# Patient Record
Sex: Female | Born: 1970 | Race: White | Hispanic: No | Smoking: Current every day smoker
Health system: Southern US, Community
[De-identification: ages and names within clinical notes are randomized; demographics above are authoritative.]

## PROBLEM LIST (undated history)

## (undated) DIAGNOSIS — I10 Essential (primary) hypertension: Secondary | ICD-10-CM

## (undated) DIAGNOSIS — G5600 Carpal tunnel syndrome, unspecified upper limb: Secondary | ICD-10-CM

## (undated) DIAGNOSIS — R569 Unspecified convulsions: Secondary | ICD-10-CM

## (undated) DIAGNOSIS — M797 Fibromyalgia: Secondary | ICD-10-CM

## (undated) DIAGNOSIS — F329 Major depressive disorder, single episode, unspecified: Secondary | ICD-10-CM

## (undated) DIAGNOSIS — Z21 Asymptomatic human immunodeficiency virus [HIV] infection status: Secondary | ICD-10-CM

## (undated) DIAGNOSIS — N921 Excessive and frequent menstruation with irregular cycle: Secondary | ICD-10-CM

## (undated) DIAGNOSIS — N852 Hypertrophy of uterus: Secondary | ICD-10-CM

## (undated) DIAGNOSIS — J45909 Unspecified asthma, uncomplicated: Secondary | ICD-10-CM

## (undated) DIAGNOSIS — R87619 Unspecified abnormal cytological findings in specimens from cervix uteri: Secondary | ICD-10-CM

## (undated) DIAGNOSIS — N946 Dysmenorrhea, unspecified: Secondary | ICD-10-CM

## (undated) DIAGNOSIS — M1990 Unspecified osteoarthritis, unspecified site: Secondary | ICD-10-CM

## (undated) DIAGNOSIS — F319 Bipolar disorder, unspecified: Secondary | ICD-10-CM

## (undated) DIAGNOSIS — F32A Depression, unspecified: Secondary | ICD-10-CM

## (undated) DIAGNOSIS — E785 Hyperlipidemia, unspecified: Secondary | ICD-10-CM

## (undated) DIAGNOSIS — F603 Borderline personality disorder: Secondary | ICD-10-CM

## (undated) DIAGNOSIS — T7840XA Allergy, unspecified, initial encounter: Secondary | ICD-10-CM

## (undated) DIAGNOSIS — B009 Herpesviral infection, unspecified: Secondary | ICD-10-CM

## (undated) DIAGNOSIS — K219 Gastro-esophageal reflux disease without esophagitis: Secondary | ICD-10-CM

## (undated) DIAGNOSIS — B20 Human immunodeficiency virus [HIV] disease: Secondary | ICD-10-CM

## (undated) DIAGNOSIS — R002 Palpitations: Secondary | ICD-10-CM

## (undated) HISTORY — DX: Hypertrophy of uterus: N85.2

## (undated) HISTORY — DX: Carpal tunnel syndrome, unspecified upper limb: G56.00

## (undated) HISTORY — DX: Depression, unspecified: F32.A

## (undated) HISTORY — DX: Major depressive disorder, single episode, unspecified: F32.9

## (undated) HISTORY — DX: Excessive and frequent menstruation with irregular cycle: N92.1

## (undated) HISTORY — DX: Dysmenorrhea, unspecified: N94.6

## (undated) HISTORY — DX: Allergy, unspecified, initial encounter: T78.40XA

## (undated) HISTORY — PX: ANKLE SURGERY: SHX546

## (undated) HISTORY — DX: Bipolar disorder, unspecified: F31.9

## (undated) HISTORY — DX: Hyperlipidemia, unspecified: E78.5

## (undated) HISTORY — DX: Herpesviral infection, unspecified: B00.9

## (undated) HISTORY — PX: SHOULDER SURGERY: SHX246

## (undated) HISTORY — DX: Unspecified abnormal cytological findings in specimens from cervix uteri: R87.619

## (undated) HISTORY — PX: FRACTURE SURGERY: SHX138

## (undated) HISTORY — PX: BACK SURGERY: SHX140

## (undated) HISTORY — PX: OTHER SURGICAL HISTORY: SHX169

## (undated) HISTORY — PX: SPINE SURGERY: SHX786

---

## 2012-10-01 ENCOUNTER — Ambulatory Visit: Payer: Self-pay

## 2013-07-02 ENCOUNTER — Telehealth: Payer: Self-pay

## 2013-07-02 NOTE — Telephone Encounter (Signed)
Pt called Rachel Underwood to request assitance with getting an appointment.  Patient stated she  was in care with [facility] briefly before.  I will need to Call [facility] to get proof of positivity since there are no labs in our system.    Appointment given to [facility] with [facility].   Laurell Josephs, RN

## 2013-07-15 ENCOUNTER — Ambulatory Visit (INDEPENDENT_AMBULATORY_CARE_PROVIDER_SITE_OTHER): Payer: Medicaid Other

## 2013-07-15 ENCOUNTER — Ambulatory Visit: Payer: Self-pay

## 2013-07-15 DIAGNOSIS — Z113 Encounter for screening for infections with a predominantly sexual mode of transmission: Secondary | ICD-10-CM

## 2013-07-15 DIAGNOSIS — F319 Bipolar disorder, unspecified: Secondary | ICD-10-CM

## 2013-07-15 DIAGNOSIS — F209 Schizophrenia, unspecified: Secondary | ICD-10-CM

## 2013-07-15 DIAGNOSIS — B20 Human immunodeficiency virus [HIV] disease: Secondary | ICD-10-CM

## 2013-07-15 DIAGNOSIS — Z79899 Other long term (current) drug therapy: Secondary | ICD-10-CM

## 2013-07-15 DIAGNOSIS — F17200 Nicotine dependence, unspecified, uncomplicated: Secondary | ICD-10-CM

## 2013-07-15 LAB — COMPLETE METABOLIC PANEL WITH GFR
BUN: 7 mg/dL (ref 6–23)
CO2: 27 mEq/L (ref 19–32)
Calcium: 9.2 mg/dL (ref 8.4–10.5)
Chloride: 105 mEq/L (ref 96–112)
Creat: 0.64 mg/dL (ref 0.50–1.10)
GFR, Est African American: >89 mL/min
GFR, Est Non African American: >89 mL/min

## 2013-07-15 LAB — LIPID PANEL
HDL: 32 mg/dL — ABNORMAL LOW (ref >39)
LDL Cholesterol: 86 mg/dL (ref 0–99)
Triglycerides: 224 mg/dL — ABNORMAL HIGH (ref <150)
VLDL: 45 mg/dL — ABNORMAL HIGH (ref 0–40)

## 2013-07-15 LAB — CBC WITH DIFFERENTIAL/PLATELET
Eosinophils Relative: 1 % (ref 0–5)
HCT: 39.4 % (ref 36.0–46.0)
Lymphocytes Relative: 35 % (ref 12–46)
Lymphs Abs: 2.3 10*3/uL (ref 0.7–4.0)
MCH: 31.5 pg (ref 26.0–34.0)
MCV: 89.3 fL (ref 78.0–100.0)
Monocytes Absolute: 0.4 10*3/uL (ref 0.1–1.0)
RBC: 4.41 MIL/uL (ref 3.87–5.11)
RDW: 13.2 % (ref 11.5–15.5)
WBC: 6.5 10*3/uL (ref 4.0–10.5)

## 2013-07-15 LAB — HEPATITIS B SURFACE ANTIGEN: Hepatitis B Surface Ag: NEGATIVE

## 2013-07-15 LAB — RPR

## 2013-07-16 LAB — URINALYSIS
Bilirubin Urine: NEGATIVE
Ketones, ur: NEGATIVE mg/dL
Nitrite: NEGATIVE
Specific Gravity, Urine: 1.012 (ref 1.005–1.030)
pH: 5.5 (ref 5.0–8.0)

## 2013-07-16 LAB — HIV-1 RNA ULTRAQUANT REFLEX TO GENTYP+: HIV-1 RNA Quant, Log: <1.3 {Log} (ref <1.30)

## 2013-07-17 DIAGNOSIS — F319 Bipolar disorder, unspecified: Secondary | ICD-10-CM | POA: Insufficient documentation

## 2013-07-17 DIAGNOSIS — F209 Schizophrenia, unspecified: Secondary | ICD-10-CM | POA: Insufficient documentation

## 2013-07-17 DIAGNOSIS — B20 Human immunodeficiency virus [HIV] disease: Secondary | ICD-10-CM | POA: Insufficient documentation

## 2013-07-17 LAB — TB SKIN TEST
Induration: 0 mm
TB Skin Test: NEGATIVE

## 2013-07-17 MED ORDER — EMTRICITAB-RILPIVIR-TENOFOV DF 200-25-300 MG PO TABS: 1.0000 | ORAL_TABLET | Freq: Every day | ORAL | Status: DC

## 2013-07-17 MED ORDER — LURASIDONE HCL 40 MG PO TABS: 40.0000 mg | ORAL_TABLET | Freq: Every day | ORAL | Status: DC

## 2013-07-25 NOTE — Progress Notes (Signed)
Pt recently separated from husband and moved to [place] to live with her Mother.  She has not seen an ID physician in 4 months and  currently taking Complera.  Past medications are Sustiva, Norvir, Truvada and Atripla. She has a history of depression , Schizophrenia and Bi polar disorder and was taking Latuda 40 mgs until she ran out of medication one month ago. Her medications were provided through patient assistance.  Patient reports a scheduled visit with Mental health sometime this month.  Per patient last viral load was undetectable.    Laurell Josephs, RN

## 2013-08-07 ENCOUNTER — Ambulatory Visit: Payer: Self-pay | Admitting: Internal Medicine

## 2013-08-26 ENCOUNTER — Ambulatory Visit (INDEPENDENT_AMBULATORY_CARE_PROVIDER_SITE_OTHER): Payer: Medicaid Other | Admitting: Internal Medicine

## 2013-08-26 VITALS — BP 117/78 | HR 53 | Temp 97.9°F | Wt 291.0 lb

## 2013-08-26 DIAGNOSIS — Z113 Encounter for screening for infections with a predominantly sexual mode of transmission: Secondary | ICD-10-CM

## 2013-08-26 DIAGNOSIS — B20 Human immunodeficiency virus [HIV] disease: Secondary | ICD-10-CM

## 2013-08-26 DIAGNOSIS — F209 Schizophrenia, unspecified: Secondary | ICD-10-CM

## 2013-08-26 DIAGNOSIS — F319 Bipolar disorder, unspecified: Secondary | ICD-10-CM

## 2013-08-26 LAB — RPR

## 2013-08-26 MED ORDER — EMTRICITAB-RILPIVIR-TENOFOV DF 200-25-300 MG PO TABS: 1.0000 | ORAL_TABLET | Freq: Every day | ORAL | Status: DC

## 2013-08-26 NOTE — Progress Notes (Signed)
[facility] HIV CLINIC NOTE  RFV: re-establishing care Subjective:    Patient ID: Rachel Underwood, female    DOB: Jul 31, 1971, 42 y.o.   MRN: [id number]  HPI Rachel Underwood is a 42 yo F with HIV, CD 580(24%)/VL <20, complera started in the last year. HIV diagnosed in 1994. Had been on previously been on other regimens but couldn't tolerate. Inc [place], [place], truvada, viread, ATV/r.  Previously treated in [place]. Moved due to separation after 40 yo marriage. Her husband is 17yo senior to her.  Now closer to dad/step-mom . [facility]. 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 No longer on latuda and on trazadone 300mg . Feeling numb. Applying for disability for the past year. Living with her dad.  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  Allergies  Allergen Reactions  . Lexapro [Escitalopram Oxalate] Rash   - haldol and codiene make her sick  Current Outpatient Prescriptions on File Prior to Visit  Medication Sig Dispense Refill  . acyclovir (ZOVIRAX) 400 MG tablet Take 400 mg by mouth every 4 (four) hours while awake.      Marland Kitchen atenolol (TENORMIN) 100 MG tablet Take 100 mg by mouth daily.      . Emtricitab-Rilpivir-Tenofovir 200-25-300 MG TABS Take 1 tablet by mouth daily.  30 tablet  0  . lurasidone (LATUDA) 40 MG TABS tablet Take 1 tablet (40 mg total) by mouth daily with breakfast.  30 tablet  0   No current facility-administered medications on file prior to visit.   Active Ambulatory Problems    Diagnosis Date Noted  . Human immunodeficiency virus (HIV) disease 07/17/2013  . Schizophrenia 07/17/2013  . Bipolar disorder, unspecified 07/17/2013   Resolved Ambulatory Problems    Diagnosis Date Noted  . No Resolved Ambulatory Problems   Past Medical History  Diagnosis Date  . Depression   - c/section in 1994 - 2008 right  ankle and right shoulder surgery - 2009 low back surgery  History  Substance Use Topics  . Smoking status: Current Every Day Smoker -- 0.50 packs/day for 30 years    Types: Cigarettes  . Smokeless tobacco: Never Used  . Alcohol Use: 40.0 oz/week    80 drink(s) per week  family history includes Cancer in her mother; Hypertension in her father.     Review of Systems  Constitutional: Negative for fever, chills, diaphoresis, activity change, appetite change, fatigue and unexpected weight change.  HENT: Negative for congestion, sore throat, rhinorrhea, sneezing, trouble swallowing and sinus pressure.  Eyes: Negative for photophobia and visual disturbance.  Respiratory: Negative for cough, chest tightness, shortness of breath, wheezing and stridor.  Cardiovascular: Negative for chest pain, palpitations and leg swelling.  Gastrointestinal: Negative for nausea, vomiting, abdominal pain, diarrhea, constipation, blood in stool, abdominal distention and anal bleeding.  Genitourinary: intermittent vaginal discharge, itchy Musculoskeletal: Negative for myalgias, back pain, joint swelling, arthralgias and gait problem.  Skin: Negative for color change, pallor, rash and wound.  Neurological: Negative for dizziness, tremors, weakness and light-headedness.  Hematological: Negative for adenopathy. Does not bruise/bleed easily.  Psychiatric/Behavioral: Negative for behavioral problems, confusion, sleep disturbance, dysphoric mood, decreased concentration and agitation.       Objective:   Physical Exam BP 117/78  Pulse 53  Temp(Src) 97.9 F (36.6 C) (Oral)  Wt 291 lb (131.997 kg)  LMP 08/08/2013 Physical Exam  Constitutional: oriented to  person, place, and time.  appears well-developed and well-nourished. No distress.  HENT:  Mouth/Throat: Oropharynx is clear and moist. No oropharyngeal exudate.  Cardiovascular: Normal rate, regular rhythm and normal heart sounds. Exam reveals no gallop and  no friction rub.  No murmur heard.  Pulmonary/Chest: Effort normal and breath sounds normal. No respiratory distress.  no wheezes.  Abdominal: Soft. Bowel sounds are normal. He exhibits no distension. There is no tenderness.  Lymphadenopathy: no cervical adenopathy.  Neurological: He is alert and oriented to person, place, and time.  Skin: Skin is warm and dry. No rash noted. No erythema.  Psychiatric: He has a normal mood and affect. His behavior is normal.       Assessment & Plan:  hiv = continue with complera; see if adap approval  Dental pain/sensitivity = needs assessment hasn't seen dentist in 6 yrs  Health maintenance = pap smear to schedule. Will do ur gc and chlam, rpr today; flu shot today  Onychomycosis = to big toe nail bilateral. Would like to try oral agent, will do lamisil 250mg  daily once she gets adap approval  hsv proph = refill with valtrex

## 2013-09-09 ENCOUNTER — Telehealth: Payer: Self-pay | Admitting: *Deleted

## 2013-09-09 ENCOUNTER — Other Ambulatory Visit: Payer: Self-pay | Admitting: *Deleted

## 2013-09-09 DIAGNOSIS — F319 Bipolar disorder, unspecified: Secondary | ICD-10-CM

## 2013-09-09 DIAGNOSIS — F209 Schizophrenia, unspecified: Secondary | ICD-10-CM

## 2013-09-09 MED ORDER — EMTRICITAB-RILPIVIR-TENOFOV DF 200-25-300 MG PO TABS: 1.0000 | ORAL_TABLET | Freq: Every day | ORAL | Status: DC

## 2013-09-09 NOTE — Telephone Encounter (Signed)
Pt called to let us know that she would prefer [facility] to deliver her monthly prescriptions once she is ADAP approved.  Pt also advised that she is still itching and has vaginal discharge.  Dr. Drue Second recommended that the patient try an over the counter yeast infection treatment for the itching. RN left this information on pt's voicemail.  Pt to follow up 10/20 in clinic. Andree Coss, RN

## 2013-09-22 ENCOUNTER — Encounter: Payer: Self-pay | Admitting: Internal Medicine

## 2013-09-22 ENCOUNTER — Ambulatory Visit (INDEPENDENT_AMBULATORY_CARE_PROVIDER_SITE_OTHER): Payer: Medicaid Other | Admitting: *Deleted

## 2013-09-22 ENCOUNTER — Ambulatory Visit (INDEPENDENT_AMBULATORY_CARE_PROVIDER_SITE_OTHER): Payer: Medicaid Other | Admitting: Internal Medicine

## 2013-09-22 VITALS — BP 127/83 | HR 68 | Temp 98.2°F | Wt 283.0 lb

## 2013-09-22 DIAGNOSIS — B20 Human immunodeficiency virus [HIV] disease: Secondary | ICD-10-CM

## 2013-09-22 DIAGNOSIS — Z124 Encounter for screening for malignant neoplasm of cervix: Secondary | ICD-10-CM

## 2013-09-22 DIAGNOSIS — F329 Major depressive disorder, single episode, unspecified: Secondary | ICD-10-CM

## 2013-09-22 DIAGNOSIS — Z1231 Encounter for screening mammogram for malignant neoplasm of breast: Secondary | ICD-10-CM

## 2013-09-22 NOTE — Progress Notes (Signed)
[facility] HIV CLINIC NOTE  RFV: 1 month follow up Subjective:    Patient ID: Rachel Underwood, female    DOB: [birth year], 42 y.o.   MRN: [id number]  HPI Rachel Underwood is a 43 yo F with HIV, CD 580(24%)/VL <20, complera started in the last year. HIV diagnosed in 1994. Had been on previously been on other regimens but couldn't tolerate. Inc [place], [place], truvada, viread, ATV/r. Previously treated in [place]. Moved due to separation after 40 yo marriage.. [facility]. 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 Marland Kitchen Applying for disability for the past year. Now on zoloft, no longer latuda, but still on trazodone.  Current Outpatient Prescriptions on File Prior to Visit  Medication Sig Dispense Refill  . acyclovir (ZOVIRAX) 400 MG tablet Take 400 mg by mouth every 4 (four) hours while awake.      Marland Kitchen atenolol (TENORMIN) 100 MG tablet Take 100 mg by mouth daily.      . Emtricitab-Rilpivir-Tenofovir 200-25-300 MG TABS Take 1 tablet by mouth daily.  30 tablet  5  . lurasidone (LATUDA) 40 MG TABS tablet Take 1 tablet (40 mg total) by mouth daily with breakfast.  30 tablet  0  . trazodone (DESYREL) 300 MG tablet Take 300 mg by mouth at bedtime.       No current facility-administered medications on file prior to visit.   Active Ambulatory Problems    Diagnosis Date Noted  . Human immunodeficiency virus (HIV) disease 07/17/2013  . Schizophrenia 07/17/2013  . Bipolar disorder, unspecified 07/17/2013   Resolved Ambulatory Problems    Diagnosis Date Noted  . No Resolved Ambulatory Problems   Past Medical History  Diagnosis Date  . Depression        Review of Systems 12 point ROS is negative     Objective:   Physical Exam BP 127/83  Pulse 68  Temp(Src) 98.2 F (36.8 C) (Oral)  Wt 283 lb (128.368 kg)  LMP 08/08/2013      Assessment & Plan:  HIV = continue on complera.  Awaiting adap approval so that she can continue meds. Still has 1-2 wks availalble  Depression = being managed by [facility]  rtc in 6 wk

## 2013-09-22 NOTE — Patient Instructions (Signed)
Your results will be ready in about a week.  I will mail them to you.  Thank you for coming to the Center for your care.  Simpson Paulos,  RN 

## 2013-09-22 NOTE — Progress Notes (Signed)
  Subjective:     Rachel Underwood is a 42 y.o. woman who comes in today for a  pap smear only.  Previous abnormal Pap smears: yes. Contraception: BTL.  Objective:  LMP 09/03/13   Pelvic Exam:  Pap smear obtained.   Assessment:    Screening pap smear.   Plan:    Follow up in one year, or as indicated by Pap results.  Pt given educational materials re: HIV and women, self-esteem, nutrition and diet management, PAP smears and partner safety. Pt given condoms.

## 2013-09-29 ENCOUNTER — Encounter: Payer: Self-pay | Admitting: *Deleted

## 2013-10-16 ENCOUNTER — Emergency Department (HOSPITAL_COMMUNITY)
Admission: EM | Admit: 2013-10-16 | Discharge: 2013-10-16 | Disposition: A | Discharge status: Home / Self Care | Payer: Medicaid Other | Attending: Emergency Medicine | Admitting: Emergency Medicine

## 2013-10-16 ENCOUNTER — Ambulatory Visit (HOSPITAL_COMMUNITY): Payer: Medicaid Other

## 2013-10-16 ENCOUNTER — Encounter (HOSPITAL_COMMUNITY): Payer: Self-pay | Admitting: Emergency Medicine

## 2013-10-16 DIAGNOSIS — F17200 Nicotine dependence, unspecified, uncomplicated: Secondary | ICD-10-CM | POA: Insufficient documentation

## 2013-10-16 DIAGNOSIS — I10 Essential (primary) hypertension: Secondary | ICD-10-CM | POA: Insufficient documentation

## 2013-10-16 DIAGNOSIS — Z79899 Other long term (current) drug therapy: Secondary | ICD-10-CM | POA: Insufficient documentation

## 2013-10-16 DIAGNOSIS — F329 Major depressive disorder, single episode, unspecified: Secondary | ICD-10-CM | POA: Insufficient documentation

## 2013-10-16 DIAGNOSIS — J209 Acute bronchitis, unspecified: Secondary | ICD-10-CM | POA: Insufficient documentation

## 2013-10-16 DIAGNOSIS — J45909 Unspecified asthma, uncomplicated: Secondary | ICD-10-CM | POA: Insufficient documentation

## 2013-10-16 DIAGNOSIS — Z21 Asymptomatic human immunodeficiency virus [HIV] infection status: Secondary | ICD-10-CM | POA: Insufficient documentation

## 2013-10-16 DIAGNOSIS — F3289 Other specified depressive episodes: Secondary | ICD-10-CM | POA: Insufficient documentation

## 2013-10-16 DIAGNOSIS — J40 Bronchitis, not specified as acute or chronic: Secondary | ICD-10-CM

## 2013-10-16 DIAGNOSIS — J329 Chronic sinusitis, unspecified: Secondary | ICD-10-CM | POA: Insufficient documentation

## 2013-10-16 HISTORY — DX: Human immunodeficiency virus (HIV) disease: B20

## 2013-10-16 HISTORY — DX: Essential (primary) hypertension: I10

## 2013-10-16 HISTORY — DX: Asymptomatic human immunodeficiency virus (hiv) infection status: Z21

## 2013-10-16 HISTORY — DX: Unspecified asthma, uncomplicated: J45.909

## 2013-10-16 MED ORDER — BENZONATATE 100 MG PO CAPS: 100.0000 mg | ORAL_CAPSULE | Freq: Three times a day (TID) | ORAL | Status: DC

## 2013-10-16 MED ORDER — AZITHROMYCIN 250 MG PO TABS
500.0000 mg | ORAL_TABLET | Freq: Once | ORAL | Status: AC
  Administered 2013-10-16: 500 mg via ORAL
  Filled 2013-10-16: qty 2

## 2013-10-16 MED ORDER — AZITHROMYCIN 250 MG PO TABS: 250.0000 mg | ORAL_TABLET | Freq: Every day | ORAL | Status: DC

## 2013-10-16 NOTE — ED Provider Notes (Signed)
CSN: [id number]     Arrival date & time 10/16/13  4098 History   First MD Initiated Contact with Patient 10/16/13 0522     Chief Complaint  Patient presents with  . Cough  . Headache  . Nasal Congestion   (Consider location/radiation/quality/duration/timing/severity/associated sxs/prior Treatment) HPI Comments: 42 year old female with a history of HIV, hypertension and asthma presents with a cough. She states that the cough started 3 weeks ago, was gradual in onset, has been persistent, is gradually worsening and is specifically worse over the last 4 days. She denies fevers or chills but has a very productive cough with a thick yellow phlegm. Nothing seems to make the symptoms better or worse, she has an albuterol inhaler which she has not been using very frequently area and she has been using over-the-counter cough medications with minimal improvement in her cough. She denies chest pain and back pain. Her last CD4 count was over 500 approximately one month ago, last viral load was undetectable.  Patient is a 42 y.o. female presenting with cough and headaches. The history is provided by the patient.  Cough Associated symptoms: headaches   Headache Associated symptoms: cough     Past Medical History  Diagnosis Date  . Depression   . HIV (human immunodeficiency virus infection)   . Hypertension   . Asthma    Past Surgical History  Procedure Laterality Date  . Ankle surgery    . Back surgery    . Shoulder surgery    . Cesarean section     Family History  Problem Relation Age of Onset  . Cancer Mother     Breast  . Hypertension Father    History  Substance Use Topics  . Smoking status: Current Every Day Smoker -- 0.50 packs/day for 30 years    Types: Cigarettes  . Smokeless tobacco: Never Used  . Alcohol Use: 0.0 oz/week   OB History   Grav Para Term Preterm Abortions TAB SAB Ect Mult Living                 Review of Systems  Respiratory: Positive for cough.    Neurological: Positive for headaches.  All other systems reviewed and are negative.    Allergies  Lexapro  Home Medications   Current Outpatient Rx  Name  Route  Sig  Dispense  Refill  . atenolol (TENORMIN) 100 MG tablet   Oral   Take 100 mg by mouth daily.         . Emtricitab-Rilpivir-Tenofovir 200-25-300 MG TABS   Oral   Take 1 tablet by mouth daily.   30 tablet   5   . sertraline (ZOLOFT) 50 MG tablet   Oral   Take 50 mg by mouth daily.         . trazodone (DESYREL) 300 MG tablet   Oral   Take 300 mg by mouth at bedtime.         Marland Kitchen acyclovir (ZOVIRAX) 400 MG tablet   Oral   Take 400 mg by mouth every 4 (four) hours while awake.         Marland Kitchen azithromycin (ZITHROMAX Z-PAK) 250 MG tablet   Oral   Take 1 tablet (250 mg total) by mouth daily. 500mg  PO day 1, then 250mg  PO days 205   6 tablet   0   . benzonatate (TESSALON) 100 MG capsule   Oral   Take 1 capsule (100 mg total) by mouth every 8 (eight) hours.  21 capsule   0   . lurasidone (LATUDA) 40 MG TABS tablet   Oral   Take 1 tablet (40 mg total) by mouth daily with breakfast.   30 tablet   0    BP 125/86  Pulse 86  Temp(Src) 98.5 F (36.9 C) (Oral)  Resp 18  Ht 5\' 5"  (1.651 m)  Wt 280 lb (127.007 kg)  BMI 46.59 kg/m2  SpO2 97%  LMP 10/15/2013 Physical Exam  Nursing note and vitals reviewed. Constitutional: She appears well-developed and well-nourished. No distress.  HENT:  Head: Normocephalic and atraumatic.  Mouth/Throat: Oropharynx is clear and moist. No oropharyngeal exudate.  Nasal passages without significant swelling or discharge, oropharynx is clear and moist  Eyes: Conjunctivae and EOM are normal. Pupils are equal, round, and reactive to light. Right eye exhibits no discharge. Left eye exhibits no discharge. No scleral icterus.  Neck: Normal range of motion. Neck supple. No JVD present. No thyromegaly present.  Cardiovascular: Normal rate, regular rhythm, normal heart sounds  and intact distal pulses.  Exam reveals no gallop and no friction rub.   No murmur heard. Pulmonary/Chest: Effort normal and breath sounds normal. No respiratory distress. She has no wheezes. She has no rales.  Abdominal: Soft. Bowel sounds are normal. She exhibits no distension and no mass. There is no tenderness.  Musculoskeletal: Normal range of motion. She exhibits no edema and no tenderness.  Lymphadenopathy:    She has no cervical adenopathy.  Neurological: She is alert. Coordination normal.  Skin: Skin is warm and dry. No rash noted. No erythema.  Psychiatric: She has a normal mood and affect. Her behavior is normal.    ED Course  Procedures (including critical care time) Labs Review Labs Reviewed - No data to display Imaging Review No results found.  EKG Interpretation   None       MDM   1. Bronchitis   2. Sinusitis    There is no significant abnormal vital signs, the patient is hemodynamically stable, speaking in full sentences and likely has a bronchitis. Because of her immunocompromised state as well as her smoking history he would likely be better to treat with antibiotics in addition to her albuterol treatments at home. I have discussed with her the exact method of taking his medications and she is amenable to the discharge plan.  Meds given in ED:  Medications  azithromycin (ZITHROMAX) tablet 500 mg (not administered)    New Prescriptions   AZITHROMYCIN (ZITHROMAX Z-PAK) 250 MG TABLET    Take 1 tablet (250 mg total) by mouth daily. 500mg  PO day 1, then 250mg  PO days 205   BENZONATATE (TESSALON) 100 MG CAPSULE    Take 1 capsule (100 mg total) by mouth every 8 (eight) hours.        Vida Roller, MD 10/16/13 [phone]

## 2013-10-16 NOTE — ED Notes (Signed)
Pt c/o headache, congestion and cough x 3 weeks.

## 2013-11-03 ENCOUNTER — Ambulatory Visit: Payer: Self-pay | Admitting: Internal Medicine

## 2013-11-06 ENCOUNTER — Ambulatory Visit (HOSPITAL_COMMUNITY): Payer: Medicaid Other

## 2013-12-05 ENCOUNTER — Other Ambulatory Visit: Payer: Self-pay | Admitting: *Deleted

## 2013-12-05 ENCOUNTER — Telehealth: Payer: Self-pay | Admitting: *Deleted

## 2013-12-05 DIAGNOSIS — B009 Herpesviral infection, unspecified: Secondary | ICD-10-CM

## 2013-12-05 MED ORDER — ACYCLOVIR 400 MG PO TABS: 400.0000 mg | ORAL_TABLET | Freq: Two times a day (BID) | ORAL | Status: DC

## 2013-12-05 MED ORDER — ACYCLOVIR 400 MG PO TABS: 400.0000 mg | ORAL_TABLET | Freq: Every day | ORAL | Status: DC

## 2013-12-05 NOTE — Telephone Encounter (Signed)
Can refill acyclovir for 400mg  five times per day for 5 days  For treatment, then do 400mg  BId for prophylaxis

## 2013-12-05 NOTE — Telephone Encounter (Signed)
Patient requesting refill of acyclovir 400mg  four times a day for her current herpes outbreak (listed as historic medication, never prescribed here).  08/26/13 office note lists HSV prophylaxis of valtrex, not prescribed at visit. Please advise which medication to send to pharmacy. Landis Gandy, RN

## 2013-12-08 NOTE — Telephone Encounter (Signed)
Sent and notified patient. Thank you!

## 2013-12-15 ENCOUNTER — Ambulatory Visit (HOSPITAL_COMMUNITY)
Admission: RE | Admit: 2013-12-15 | Discharge: 2013-12-15 | Disposition: A | Discharge status: Home / Self Care | Payer: Medicaid Other | Source: Ambulatory Visit | Attending: Internal Medicine | Admitting: Internal Medicine

## 2013-12-15 DIAGNOSIS — Z1231 Encounter for screening mammogram for malignant neoplasm of breast: Secondary | ICD-10-CM

## 2013-12-24 ENCOUNTER — Other Ambulatory Visit: Payer: Self-pay | Admitting: Licensed Clinical Social Worker

## 2013-12-24 DIAGNOSIS — I10 Essential (primary) hypertension: Secondary | ICD-10-CM

## 2013-12-24 MED ORDER — ATENOLOL 100 MG PO TABS: 100.0000 mg | ORAL_TABLET | Freq: Every day | ORAL | Status: DC

## 2014-01-01 ENCOUNTER — Other Ambulatory Visit (INDEPENDENT_AMBULATORY_CARE_PROVIDER_SITE_OTHER): Payer: Medicaid Other

## 2014-01-01 ENCOUNTER — Ambulatory Visit: Payer: Self-pay

## 2014-01-01 DIAGNOSIS — B20 Human immunodeficiency virus [HIV] disease: Secondary | ICD-10-CM

## 2014-01-01 LAB — CBC WITH DIFFERENTIAL/PLATELET
Basophils Absolute: 0 10*3/uL (ref 0.0–0.1)
Basophils Relative: 0 % (ref 0–1)
EOS ABS: 0.1 10*3/uL (ref 0.0–0.7)
EOS PCT: 1 % (ref 0–5)
HCT: 40.1 % (ref 36.0–46.0)
HEMOGLOBIN: 14.1 g/dL (ref 12.0–15.0)
LYMPHS ABS: 3.2 10*3/uL (ref 0.7–4.0)
Lymphocytes Relative: 41 % (ref 12–46)
MCH: 31.7 pg (ref 26.0–34.0)
MCHC: 35.2 g/dL (ref 30.0–36.0)
MCV: 90.1 fL (ref 78.0–100.0)
MONOS PCT: 7 % (ref 3–12)
Monocytes Absolute: 0.6 10*3/uL (ref 0.1–1.0)
Neutro Abs: 3.9 10*3/uL (ref 1.7–7.7)
Neutrophils Relative %: 51 % (ref 43–77)
Platelets: 230 10*3/uL (ref 150–400)
RBC: 4.45 MIL/uL (ref 3.87–5.11)
RDW: 13.1 % (ref 11.5–15.5)
WBC: 7.8 10*3/uL (ref 4.0–10.5)

## 2014-01-01 LAB — COMPREHENSIVE METABOLIC PANEL
ALBUMIN: 3.9 g/dL (ref 3.5–5.2)
ALT: 13 U/L (ref 0–35)
AST: 14 U/L (ref 0–37)
Alkaline Phosphatase: 57 U/L (ref 39–117)
BUN: 9 mg/dL (ref 6–23)
CHLORIDE: 106 meq/L (ref 96–112)
CO2: 26 mEq/L (ref 19–32)
Calcium: 9.4 mg/dL (ref 8.4–10.5)
Creat: 0.66 mg/dL (ref 0.50–1.10)
GLUCOSE: 90 mg/dL (ref 70–99)
Potassium: 5.2 mEq/L (ref 3.5–5.3)
Sodium: 137 mEq/L (ref 135–145)
Total Bilirubin: 0.5 mg/dL (ref 0.2–1.2)
Total Protein: 6.9 g/dL (ref 6.0–8.3)

## 2014-01-02 LAB — T-HELPER CELL (CD4) - (RCID CLINIC ONLY)
CD4 T CELL HELPER: 27 % — AB (ref 33–55)
CD4 T Cell Abs: 890 /uL (ref 400–2700)

## 2014-01-04 LAB — HIV-1 RNA QUANT-NO REFLEX-BLD
HIV 1 RNA Quant: <20 copies/mL (ref <20)
HIV-1 RNA Quant, Log: <1.3 {Log} (ref <1.30)

## 2014-01-05 ENCOUNTER — Other Ambulatory Visit: Payer: Self-pay | Admitting: Licensed Clinical Social Worker

## 2014-01-05 DIAGNOSIS — F209 Schizophrenia, unspecified: Secondary | ICD-10-CM

## 2014-01-05 DIAGNOSIS — F319 Bipolar disorder, unspecified: Secondary | ICD-10-CM

## 2014-01-05 MED ORDER — EMTRICITAB-RILPIVIR-TENOFOV DF 200-25-300 MG PO TABS: 1.0000 | ORAL_TABLET | Freq: Every day | ORAL | Status: DC

## 2014-01-07 ENCOUNTER — Encounter: Payer: Self-pay | Admitting: Internal Medicine

## 2014-01-07 ENCOUNTER — Ambulatory Visit (INDEPENDENT_AMBULATORY_CARE_PROVIDER_SITE_OTHER): Payer: Medicaid Other | Admitting: Internal Medicine

## 2014-01-07 VITALS — BP 131/84 | HR 74 | Temp 98.6°F | Ht 65.5 in | Wt 279.0 lb

## 2014-01-07 DIAGNOSIS — J111 Influenza due to unidentified influenza virus with other respiratory manifestations: Secondary | ICD-10-CM

## 2014-01-07 DIAGNOSIS — Z23 Encounter for immunization: Secondary | ICD-10-CM

## 2014-01-07 NOTE — Progress Notes (Signed)
   Subjective:    Patient ID: Verda Willis, female    DOB: [birth year], 43 y.o.   MRN: [id number]  HPI herre for 5 days of congestion, sinus pain, post nasal drip.  Had subjective fever initially with muscle aches.  + sick contact.  Smokes, has seasonal allergies.     Review of Systems  Constitutional: Negative for fever.  HENT: Positive for congestion, postnasal drip, rhinorrhea, sinus pressure and sore throat.   Respiratory: Positive for cough. Negative for shortness of breath.   Gastrointestinal: Negative for nausea and diarrhea.       Objective:   Physical Exam  Constitutional: She appears well-developed and well-nourished.  HENT:  Mouth/Throat: No oropharyngeal exudate.  + red streaking  Eyes: No scleral icterus.  Cardiovascular: Normal rate, regular rhythm and normal heart sounds.   No murmur heard. Pulmonary/Chest: Effort normal and breath sounds normal. No respiratory distress. She has no wheezes. She has no rales.  Lymphadenopathy:    She has no cervical adenopathy.  Skin: No rash noted.          Assessment & Plan:

## 2014-01-07 NOTE — Assessment & Plan Note (Signed)
Supportive care, ibuprofen, saline spray, antihistamine.

## 2014-01-09 ENCOUNTER — Telehealth: Payer: Self-pay | Admitting: *Deleted

## 2014-01-09 ENCOUNTER — Other Ambulatory Visit: Payer: Self-pay | Admitting: Internal Medicine

## 2014-01-09 MED ORDER — PSEUDOEPH-BROMPHEN-DM 30-2-10 MG/5ML PO SYRP: 5.0000 mL | ORAL_SOLUTION | Freq: Three times a day (TID) | ORAL | Status: DC | PRN

## 2014-01-09 MED ORDER — DOXYCYCLINE HYCLATE 100 MG PO TABS: 100.0000 mg | ORAL_TABLET | Freq: Two times a day (BID) | ORAL | Status: DC

## 2014-01-09 NOTE — Telephone Encounter (Signed)
Patient called and advised she is not feeling any better since her visit 01/07/14. She advised she has yellow/green mucus when she blows her nose and coughs, she also has a low grade fever and thinks she has a sinus infection. I tried to give the patient an appt and she insist that she should not have to come back because she was just here. Advised her that she needs an appt due to the fact that her symptoms have changed. She asked if I could just ask her doctor to call in a cough Rx and an antibiotic. Advised her will ask but can not promise anything. And will call her once I get an answer.

## 2014-01-15 ENCOUNTER — Ambulatory Visit (INDEPENDENT_AMBULATORY_CARE_PROVIDER_SITE_OTHER): Payer: Medicaid Other | Admitting: Internal Medicine

## 2014-01-15 ENCOUNTER — Encounter: Payer: Self-pay | Admitting: Internal Medicine

## 2014-01-15 VITALS — BP 125/82 | HR 65 | Temp 98.7°F | Wt 287.0 lb

## 2014-01-15 DIAGNOSIS — F329 Major depressive disorder, single episode, unspecified: Secondary | ICD-10-CM

## 2014-01-15 DIAGNOSIS — F3289 Other specified depressive episodes: Secondary | ICD-10-CM

## 2014-01-15 DIAGNOSIS — B20 Human immunodeficiency virus [HIV] disease: Secondary | ICD-10-CM

## 2014-01-15 DIAGNOSIS — F32A Depression, unspecified: Secondary | ICD-10-CM

## 2014-01-15 DIAGNOSIS — I10 Essential (primary) hypertension: Secondary | ICD-10-CM

## 2014-01-15 MED ORDER — ELVITEG-COBIC-EMTRICIT-TENOFDF 150-150-200-300 MG PO TABS: 1.0000 | ORAL_TABLET | Freq: Every day | ORAL | Status: DC

## 2014-01-15 NOTE — Progress Notes (Signed)
Subjective:    Patient ID: Rachel Underwood, female    DOB: 06-Feb-1971, 43 y.o.   MRN: [id number]  HPI Kelley Cd 4 coutn of 890/VL<20, on complera despite missing at least 10 doses in the last month. Taking care of granddaughter full time. Wants to switch agent due to food constraints with complera. She states that she is often not consistently hungry thus doesn't take her hiv medications. She is in clinic with her 60 month old granddaughter "serena"  Current Outpatient Prescriptions on File Prior to Visit  Medication Sig Dispense Refill  . acyclovir (ZOVIRAX) 400 MG tablet Take 1 tablet (400 mg total) by mouth 2 (two) times daily. For prophylaxis.  60 tablet  5  . atenolol (TENORMIN) 100 MG tablet Take 1 tablet (100 mg total) by mouth daily.  30 tablet  6  . brompheniramine-pseudoephedrine-DM 30-2-10 MG/5ML syrup Take 5 mLs by mouth 3 (three) times daily as needed.  120 mL  0  . doxycycline (VIBRA-TABS) 100 MG tablet Take 1 tablet (100 mg total) by mouth 2 (two) times daily.  10 tablet  0  . Emtricitab-Rilpivir-Tenofovir 200-25-300 MG TABS Take 1 tablet by mouth daily.  30 tablet  0  . sertraline (ZOLOFT) 50 MG tablet Take 50 mg by mouth daily.      . trazodone (DESYREL) 300 MG tablet Take 300 mg by mouth at bedtime.       No current facility-administered medications on file prior to visit.     Review of Systems  Constitutional: Negative for fever, chills, diaphoresis, activity change, appetite change, fatigue and unexpected weight change.  HENT: Negative for congestion, sore throat, rhinorrhea, sneezing, trouble swallowing and sinus pressure.  Eyes: Negative for photophobia and visual disturbance.  Respiratory: Negative for cough, chest tightness, shortness of breath, wheezing and stridor.  Cardiovascular: Negative for chest pain, palpitations and leg swelling.  Gastrointestinal: Negative for nausea, vomiting, abdominal pain, diarrhea, constipation, blood in stool, abdominal distention and  anal bleeding.  Genitourinary: Negative for dysuria, hematuria, flank pain and difficulty urinating.  Musculoskeletal: Negative for myalgias, back pain, joint swelling, arthralgias and gait problem.  Skin: Negative for color change, pallor, rash and wound.  Neurological: Negative for dizziness, tremors, weakness and light-headedness.  Hematological: Negative for adenopathy. Does not bruise/bleed easily.  Psychiatric/Behavioral: Negative for behavioral problems, confusion, sleep disturbance, dysphoric mood, decreased concentration and agitation.       Objective:   Physical Exam  BP 125/82  Pulse 65  Temp(Src) 98.7 F (37.1 C) (Oral)  Wt 287 lb (130.182 kg)  Constitutional:  oriented to person, place, and time. appears well-developed and well-nourished. No distress.  HENT:  Mouth/Throat: Oropharynx is clear and moist. No oropharyngeal exudate.  Cardiovascular: Normal rate, regular rhythm and normal heart sounds. Exam reveals no gallop and no friction rub.  No murmur heard.  Pulmonary/Chest: Effort normal and breath sounds normal. No respiratory distress.  has no wheezes.  Abdominal: Soft. Bowel sounds are normal.  exhibits no distension. There is no tenderness.  Lymphadenopathy: no cervical adenopathy.  Neurological: alert and oriented to person, place, and time.  Skin: Skin is warm and dry. No rash noted. No erythema.  Psychiatric: a normal mood and affect. His behavior is normal.      Assessment & Plan:   HIV disease = well controlled but poor compliance. Will switch to stribild. I have heavily discussed that she cannot miss more than 1 dose per month on new regimen, otherwise looking to do more complicated HIV  regimen. Gave suggestion to set alarm. As well as give her pill box  Adherence = spent 20 min discussion on adherence  Hypertension = well controlled  Depression = continues on zoloft and trazodone  rtc in 4 wk for adherence evaluation

## 2014-01-16 ENCOUNTER — Other Ambulatory Visit: Payer: Self-pay | Admitting: *Deleted

## 2014-01-16 DIAGNOSIS — B20 Human immunodeficiency virus [HIV] disease: Secondary | ICD-10-CM

## 2014-01-16 MED ORDER — ELVITEG-COBIC-EMTRICIT-TENOFDF 150-150-200-300 MG PO TABS: 1.0000 | ORAL_TABLET | Freq: Every day | ORAL | Status: DC

## 2014-02-10 ENCOUNTER — Other Ambulatory Visit: Payer: Self-pay | Admitting: Internal Medicine

## 2014-02-19 ENCOUNTER — Ambulatory Visit (INDEPENDENT_AMBULATORY_CARE_PROVIDER_SITE_OTHER): Payer: Medicaid Other | Admitting: Internal Medicine

## 2014-02-19 VITALS — BP 96/64 | HR 59 | Temp 98.6°F | Wt 287.0 lb

## 2014-02-19 DIAGNOSIS — G4700 Insomnia, unspecified: Secondary | ICD-10-CM

## 2014-02-19 DIAGNOSIS — B20 Human immunodeficiency virus [HIV] disease: Secondary | ICD-10-CM

## 2014-02-19 NOTE — Progress Notes (Signed)
  [facility] - Pharmacist    HPI: Rachel Underwood is a 43 y.o. female here for a follow-up appointment for her HIV infection. She was recently changed to Man in February of 2015. Her latest labs from January 2015 show an undetectable HIV viral load and a CD4 count of 890.   Allergies: Allergies  Allergen Reactions  . Lexapro [Escitalopram Oxalate] Rash    Vitals: Temp: 98.6 F (37 C) (03/19 1109) Temp src: Oral (03/19 1109) BP: 96/64 mmHg (03/19 1109) Pulse Rate: 59 (03/19 1109)  Past Medical History: Past Medical History  Diagnosis Date  . Depression   . HIV (human immunodeficiency virus infection)   . Hypertension   . Asthma     Social History: History   Social History  . Marital Status: Legally Separated    Spouse Name: N/A    Number of Children: N/A  . Years of Education: N/A   Social History Main Topics  . Smoking status: Current Every Day Smoker -- 0.50 packs/day for 30 years    Types: Cigarettes  . Smokeless tobacco: Never Used  . Alcohol Use: 0.0 oz/week  . 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   . Sexual Activity: Not Currently    Partners: Male    Birth Control/ Protection: Condom   Other Topics Concern  . Not on file   Social History Narrative  . No narrative on file    Previous Regimen: Complera  Current Regimen: Stribild  Labs: HIV 1 RNA Quant (copies/mL)  Date Value  01/01/2014 <20   07/15/2013 <20      CD4 T Cell Abs (/uL)  Date Value  01/01/2014 890   07/15/2013 580      Hep B S Ab (no units)  Date Value  07/15/2013 POS*     Hepatitis B Surface Ag (no units)  Date Value  07/15/2013 NEGATIVE      HCV Ab (no units)  Date Value  07/15/2013 NEGATIVE     Lipids:    Component Value Date/Time   CHOL 163 07/15/2013 1223   TRIG 224* 07/15/2013 1223   HDL 32* 07/15/2013 1223   CHOLHDL 5.1 07/15/2013 1223   VLDL 45* 07/15/2013 1223   LDLCALC 86 07/15/2013  1223    Assessment: Ms. Rachel Underwood says she has missed 1-2 doses of her Stribild over the last month. She attributes this to taking care of a 98 month old, so the mornings get hectic, and this is when she usually takes her medications. We reviewed the importance of adherence and taking her medication every day. She then clarified that even if she misses her morning dose, she takes it in the afternoon. She also admits to some nausea and worsening hot flashes that began around the time of her medication change. We discussed taking the Stribild with food, as she has been taking it on an empty stomach in the morning, and this can help reduce nausea.   Recommendations: Continue Stribild; try taking with food to see if that helps with nausea. Follow-up with adherence, side effects, medication issues at her next visit.  Maisen Schmit C. Lovie Zarling, PharmD Clinical Pharmacist-Resident Pager: [phone] Pharmacy: [phone] 02/19/2014 11:26 AM

## 2014-02-19 NOTE — Progress Notes (Signed)
Subjective:    Patient ID: Rachel Underwood, female    DOB: Rachel Underwood 11, 1972, 43 y.o.   MRN: [id number]  HPI Rachel Underwood is a 43yo F with HIV, Cd 4 coutn of 890/VL<20, swithced to [facility] in mid February, reports to have missed 3 doses in the last month. Late on 3-4 doses. Usually takes at 2 pm. Gets nausea from medications in the morning. Often only drinks coffee for breakfast.  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 acyclovir (ZOVIRAX) 400 MG tablet Take 1 tablet (400 mg total) by mouth 2 (two) times daily. For prophylaxis.  60 tablet  5  . atenolol (TENORMIN) 100 MG tablet Take 1 tablet (100 mg total) by mouth daily.  30 tablet  6  . brompheniramine-pseudoephedrine-DM 30-2-10 MG/5ML syrup Take 5 mLs by mouth 3 (three) times daily as needed.  120 mL  0  . elvitegravir-cobicistat-emtricitabine-tenofovir (STRIBILD) 150-150-200-300 MG TABS tablet Take 1 tablet by mouth daily with breakfast.  30 tablet  11  . sertraline (ZOLOFT) 50 MG tablet Take 50 mg by mouth daily.      . trazodone (DESYREL) 300 MG tablet Take 300 mg by mouth at bedtime.       No current facility-administered medications on file prior to visit.    Active Ambulatory Problems    Diagnosis Date Noted  . Human immunodeficiency virus (HIV) disease 07/17/2013  . Schizophrenia 07/17/2013  . Bipolar disorder, unspecified 07/17/2013  . Influenza with other respiratory manifestations 01/07/2014  . HTN (hypertension) 01/15/2014   Resolved Ambulatory Problems    Diagnosis Date Noted  . No Resolved Ambulatory Problems   Past Medical History  Diagnosis Date  . Depression   . HIV (human immunodeficiency virus infection)   . Hypertension    . Asthma    History  Substance Use Topics  . Smoking status: Current Every Day Smoker -- 0.50 packs/day for 30 years    Types: Cigarettes  . Smokeless tobacco: Never Used  . Alcohol Use: 0.0 oz/week     Review of Systems 10 point ros is negative except for insomnia    Objective:   Physical Exam BP 96/64  Pulse 59  Temp(Src) 98.6 F (37 C) (Oral)  Wt 287 lb (130.182 kg) Physical Exam  Constitutional:  oriented to person, place, and time. appears well-developed and well-nourished. No distress.  HENT:  Mouth/Throat: Oropharynx is clear and moist. No oropharyngeal exudate.  Cardiovascular: Normal rate, regular rhythm and normal heart sounds. Exam reveals no gallop and no friction rub.  No murmur heard.  Pulmonary/Chest: Effort normal and breath sounds normal. No respiratory distress.  has no wheezes.  Abdominal: Soft. Bowel sounds are normal.  exhibits no distension. There is no tenderness.  Lymphadenopathy: no cervical adenopathy.  Neurological: alert and oriented to person, place, and time.  Skin: Skin is warm and dry. No rash noted. No erythema.  Psychiatric: a normal mood and affect.  behavior is normal.         Assessment & Plan:   hiv = continue with stribild, will check labs at next visit  rtc in 6 wk- check labs, and 7 wk visit  nightsweats = she reports family history of early menopause,  although her nightsweats did coinicde when she started to take hiv medications. 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 I asked her to discuss with gynecologist.  Insomnia =asked to see if she can find support at home to help get at least one good night of rest

## 2014-03-25 ENCOUNTER — Emergency Department (HOSPITAL_COMMUNITY): Payer: Medicaid Other

## 2014-03-25 ENCOUNTER — Encounter (HOSPITAL_COMMUNITY): Payer: Self-pay | Admitting: Emergency Medicine

## 2014-03-25 ENCOUNTER — Emergency Department (HOSPITAL_COMMUNITY)
Admission: EM | Admit: 2014-03-25 | Discharge: 2014-03-25 | Disposition: A | Discharge status: Home / Self Care | Payer: Medicaid Other | Attending: Emergency Medicine | Admitting: Emergency Medicine

## 2014-03-25 DIAGNOSIS — M778 Other enthesopathies, not elsewhere classified: Secondary | ICD-10-CM

## 2014-03-25 DIAGNOSIS — F17200 Nicotine dependence, unspecified, uncomplicated: Secondary | ICD-10-CM | POA: Insufficient documentation

## 2014-03-25 DIAGNOSIS — I10 Essential (primary) hypertension: Secondary | ICD-10-CM | POA: Insufficient documentation

## 2014-03-25 DIAGNOSIS — M65849 Other synovitis and tenosynovitis, unspecified hand: Principal | ICD-10-CM

## 2014-03-25 DIAGNOSIS — F3289 Other specified depressive episodes: Secondary | ICD-10-CM | POA: Insufficient documentation

## 2014-03-25 DIAGNOSIS — Z21 Asymptomatic human immunodeficiency virus [HIV] infection status: Secondary | ICD-10-CM | POA: Insufficient documentation

## 2014-03-25 DIAGNOSIS — F329 Major depressive disorder, single episode, unspecified: Secondary | ICD-10-CM | POA: Insufficient documentation

## 2014-03-25 DIAGNOSIS — J45909 Unspecified asthma, uncomplicated: Secondary | ICD-10-CM | POA: Insufficient documentation

## 2014-03-25 DIAGNOSIS — Z79899 Other long term (current) drug therapy: Secondary | ICD-10-CM | POA: Insufficient documentation

## 2014-03-25 DIAGNOSIS — M65839 Other synovitis and tenosynovitis, unspecified forearm: Secondary | ICD-10-CM | POA: Insufficient documentation

## 2014-03-25 HISTORY — DX: Borderline personality disorder: F60.3

## 2014-03-25 MED ORDER — IBUPROFEN 600 MG PO TABS: 600.0000 mg | ORAL_TABLET | Freq: Four times a day (QID) | ORAL | Status: DC | PRN

## 2014-03-25 MED ORDER — IBUPROFEN 800 MG PO TABS
800.0000 mg | ORAL_TABLET | Freq: Once | ORAL | Status: AC
  Administered 2014-03-25: 800 mg via ORAL
  Filled 2014-03-25: qty 1

## 2014-03-25 NOTE — ED Provider Notes (Signed)
CSN: [id number]     Arrival date & time 03/25/14  1233 History   First MD Initiated Contact with Patient 03/25/14 1248     Chief Complaint  Patient presents with  . Hand Pain     (Consider location/radiation/quality/duration/timing/severity/associated sxs/prior Treatment) HPI Comments: Rachel Underwood is a 43 y.o. Female presenting with a two-week history of right wrist and hand pain.  She describes hitting her head against a door approximately one month ago and was seen at an outside facility at which time she was told her x-rays were negative.  Her pain improved until 2 weeks ago when she developed worsening pain along her from and medial wrist and is worsened with extension of the wrist.  She denies radiation of pain and has no numbness or weakness in her fingers.  She is taking no medications for this complaint.  She does have a history of carpal tunnel syndrome in his wrist which had improved but really denies lower wrist pain or symptoms similar to her prior carpal tunnel problem.     The history is provided by the patient.    Past Medical History  Diagnosis Date  . Depression   . HIV (human immunodeficiency virus infection)   . Hypertension   . Asthma   . Borderline personality disorder    Past Surgical History  Procedure Laterality Date  . Ankle surgery    . Back surgery    . Shoulder surgery    . Cesarean section     Family History  Problem Relation Age of Onset  . Cancer Mother     Breast  . Hypertension Father    History  Substance Use Topics  . Smoking status: Current Every Day Smoker -- 0.50 packs/day for 30 years    Types: Cigarettes  . Smokeless tobacco: Never Used  . Alcohol Use: 0.0 oz/week     Comment: occ   OB History   Grav Para Term Preterm Abortions TAB SAB Ect Mult Living                 Review of Systems  Constitutional: Negative for fever.  Musculoskeletal: Positive for arthralgias. Negative for joint swelling and myalgias.  Neurological:  Negative for weakness and numbness.      Allergies  Codeine; Haldol; and Lexapro  Home Medications   Prior to Admission medications   Medication Sig Start Date End Date Taking? Authorizing Provider  acyclovir (ZOVIRAX) 400 MG tablet Take 1 tablet (400 mg total) by mouth 2 (two) times daily. For prophylaxis. 12/05/13  Yes Carlyle Basques, MD  atenolol (TENORMIN) 100 MG tablet Take 1 tablet (100 mg total) by mouth daily. 12/24/13  Yes Truman Hayward, MD  elvitegravir-cobicistat-emtricitabine-tenofovir (STRIBILD) 150-150-200-300 MG TABS tablet Take 1 tablet by mouth daily with breakfast. 01/16/14  Yes Carlyle Basques, MD  ibuprofen (ADVIL,MOTRIN) 800 MG tablet Take 800 mg by mouth every 6 (six) hours as needed for moderate pain.   Yes Historical Provider, MD  loratadine (CLARITIN) 10 MG tablet Take 10 mg by mouth daily.   Yes Historical Provider, MD  medroxyPROGESTERone (PROVERA) 10 MG tablet Take 10 mg by mouth daily. For 10 days a month.   Yes Historical Provider, MD  sertraline (ZOLOFT) 50 MG tablet Take 50 mg by mouth daily.   Yes Historical Provider, MD  trazodone (DESYREL) 300 MG tablet Take 300 mg by mouth at bedtime.   Yes Historical Provider, MD  ibuprofen (ADVIL,MOTRIN) 600 MG tablet Take 1 tablet (600 mg  total) by mouth every 6 (six) hours as needed. 03/25/14   Evalee Jefferson, PA-C   BP 120/63  Pulse 66  Temp(Src) 98.3 F (36.8 C) (Oral)  Resp 20  Ht 5\' 6"  (1.676 m)  Wt 279 lb (126.554 kg)  BMI 45.05 kg/m2  SpO2 98%  LMP 02/27/2014 Physical Exam  Constitutional: She appears well-developed and well-nourished.  HENT:  Head: Atraumatic.  Neck: Normal range of motion.  Cardiovascular:  Pulses equal bilaterally  Musculoskeletal: She exhibits tenderness. She exhibits no edema.       Right hand: She exhibits tenderness. She exhibits normal range of motion, normal two-point discrimination, normal capillary refill, no deformity and no swelling. Normal sensation noted. Normal strength  noted.  Tender to palpation along her right first metacarpal bone.  She has no snuffbox tenderness.  Distal cap refill is less than 2 seconds and distal sensation is intact and normal.  Radial pulses full.  She does have pain with extension into a lesser extent with flexion of the right wrist.  No forearm or elbow pain.  Neurological: She is alert. She has normal strength. She displays normal reflexes. No sensory deficit.  Skin: Skin is warm and dry.  Psychiatric: She has a normal mood and affect.    ED Course  Procedures (including critical care time) Labs Review Labs Reviewed - No data to display  Imaging Review Dg Hand Complete Right  03/25/2014   CLINICAL DATA:  Pain in the top of the hand near the wrist  EXAM: RIGHT HAND - COMPLETE 3+ VIEW  COMPARISON:  None.  FINDINGS: There is no evidence of fracture or dislocation. There is no evidence of arthropathy or other focal bone abnormality. Soft tissues are unremarkable.  IMPRESSION: Negative.   Electronically Signed   By: Kathreen Devoid   On: 03/25/2014 14:04     EKG Interpretation None      MDM   Final diagnoses:  Tendonitis of wrist, right    Patients labs and/or radiological studies were viewed and considered during the medical decision making and disposition process. Patient was placed in a Velcro wrist splint for comfort.  She was encouraged elevation, heat therapy, ibuprofen 600 mg 4 times a day when necessary pain.  Referral to orthopedics for further management if her symptoms persist.    Evalee Jefferson, PA-C 03/25/14 1432

## 2014-03-25 NOTE — ED Notes (Signed)
Pt received discharge instructions and prescriptions, verbalized understanding and has no further questions. Pt ambulated to exit in stable condition accompanied by family.  Advised to return to emergency department with new or worsening symptoms.  

## 2014-03-25 NOTE — ED Provider Notes (Signed)
  Medical screening examination/treatment/procedure(s) were performed by non-physician practitioner and as supervising physician I was immediately available for consultation/collaboration.   EKG Interpretation None         Carmin Muskrat, MD 03/25/14 1437

## 2014-03-25 NOTE — ED Notes (Signed)
Pt reports 1 month ago hit a door and had xray of r hand.  Reports was told hand was bruised.  2weeks ago started having pain in hand again, swelling, and inability to grip things.  Denies new injury.  Radial pulse present.

## 2014-03-25 NOTE — Discharge Instructions (Signed)
Tendinitis °Tendinitis is swelling and inflammation of the tendons. Tendons are band-like tissues that connect muscle to bone. Tendinitis commonly occurs in the:  °· Shoulders (rotator cuff). °· Heels (Achilles tendon). °· Elbows (triceps tendon). °CAUSES °Tendinitis is usually caused by overusing the tendon, muscles, and joints involved. When the tissue surrounding a tendon (synovium) becomes inflamed, it is called tenosynovitis. Tendinitis commonly develops in people whose jobs require repetitive motions. °SYMPTOMS °· Pain. °· Tenderness. °· Mild swelling. °DIAGNOSIS °Tendinitis is usually diagnosed by physical exam. Your caregiver may also order X-rays or other imaging tests. °TREATMENT °Your caregiver may recommend certain medicines or exercises for your treatment. °HOME CARE INSTRUCTIONS  °· Use a sling or splint for as long as directed by your caregiver until the pain decreases. °· Put ice on the injured area. °· Put ice in a plastic bag. °· Place a towel between your skin and the bag. °· Leave the ice on for 15-20 minutes, 03-04 times a day. °· Avoid using the limb while the tendon is painful. Perform gentle range of motion exercises only as directed by your caregiver. Stop exercises if pain or discomfort increase, unless directed otherwise by your caregiver. °· Only take over-the-counter or prescription medicines for pain, discomfort, or fever as directed by your caregiver. °SEEK MEDICAL CARE IF:  °· Your pain and swelling increase. °· You develop new, unexplained symptoms, especially increased numbness in the hands. °MAKE SURE YOU:  °· Understand these instructions. °· Will watch your condition. °· Will get help right away if you are not doing well or get worse. °Document Released: 11/17/2000 Document Revised: 02/12/2012 Document Reviewed: 02/06/2011 °Exit[employer]® Patient Information ©2014 [employer]. ° °

## 2014-03-30 ENCOUNTER — Telehealth: Payer: Self-pay | Admitting: Orthopedic Surgery

## 2014-03-30 NOTE — Telephone Encounter (Signed)
Patient stopped in office on 03/25/14 following Emergency Room visit at [facility] for problem of right hand/wrist pain.  She mentioned also having been to [facility] and had Xrays there.  Discussed process of requesting films, records, reports from [facility], as well as insurance referral authorization from primary care physician per her insurance requirement.  States she is in process of changing to new primary care, and has an appointment scheduled for 04/03/14.  Aware we will need referral as well as previous [facility] hospital records, films, as discussed.  * 03/30/14 - Received film in mail.  All of above items noted will be needed prior to schedule of appointment.

## 2014-04-02 ENCOUNTER — Other Ambulatory Visit (INDEPENDENT_AMBULATORY_CARE_PROVIDER_SITE_OTHER): Payer: Medicaid Other

## 2014-04-02 DIAGNOSIS — B20 Human immunodeficiency virus [HIV] disease: Secondary | ICD-10-CM

## 2014-04-02 LAB — CBC WITH DIFFERENTIAL/PLATELET
BASOS ABS: 0 10*3/uL (ref 0.0–0.1)
Basophils Relative: 0 % (ref 0–1)
EOS PCT: 1 % (ref 0–5)
Eosinophils Absolute: 0.1 10*3/uL (ref 0.0–0.7)
HCT: 37.1 % (ref 36.0–46.0)
Hemoglobin: 12.9 g/dL (ref 12.0–15.0)
LYMPHS PCT: 40 % (ref 12–46)
Lymphs Abs: 2.6 10*3/uL (ref 0.7–4.0)
MCH: 31.5 pg (ref 26.0–34.0)
MCHC: 34.8 g/dL (ref 30.0–36.0)
MCV: 90.5 fL (ref 78.0–100.0)
Monocytes Absolute: 0.5 10*3/uL (ref 0.1–1.0)
Monocytes Relative: 7 % (ref 3–12)
NEUTROS ABS: 3.4 10*3/uL (ref 1.7–7.7)
Neutrophils Relative %: 52 % (ref 43–77)
Platelets: 224 10*3/uL (ref 150–400)
RBC: 4.1 MIL/uL (ref 3.87–5.11)
RDW: 14.1 % (ref 11.5–15.5)
WBC: 6.6 10*3/uL (ref 4.0–10.5)

## 2014-04-02 LAB — COMPREHENSIVE METABOLIC PANEL
ALT: 10 U/L (ref 0–35)
AST: 14 U/L (ref 0–37)
Albumin: 3.8 g/dL (ref 3.5–5.2)
Alkaline Phosphatase: 48 U/L (ref 39–117)
BILIRUBIN TOTAL: 0.5 mg/dL (ref 0.2–1.2)
BUN: 13 mg/dL (ref 6–23)
CALCIUM: 8.8 mg/dL (ref 8.4–10.5)
CHLORIDE: 109 meq/L (ref 96–112)
CO2: 25 mEq/L (ref 19–32)
CREATININE: 0.77 mg/dL (ref 0.50–1.10)
Glucose, Bld: 73 mg/dL (ref 70–99)
Potassium: 4.2 mEq/L (ref 3.5–5.3)
Sodium: 139 mEq/L (ref 135–145)
Total Protein: 6.3 g/dL (ref 6.0–8.3)

## 2014-04-03 ENCOUNTER — Ambulatory Visit (HOSPITAL_COMMUNITY)
Admission: RE | Admit: 2014-04-03 | Discharge: 2014-04-03 | Disposition: A | Discharge status: Home / Self Care | Payer: Medicaid Other | Source: Ambulatory Visit | Attending: Internal Medicine | Admitting: Internal Medicine

## 2014-04-03 DIAGNOSIS — I498 Other specified cardiac arrhythmias: Secondary | ICD-10-CM | POA: Insufficient documentation

## 2014-04-03 LAB — T-HELPER CELL (CD4) - (RCID CLINIC ONLY)
CD4 T CELL HELPER: 32 % — AB (ref 33–55)
CD4 T Cell Abs: 870 /uL (ref 400–2700)

## 2014-04-03 LAB — HIV-1 RNA QUANT-NO REFLEX-BLD
HIV 1 RNA Quant: <20 copies/mL (ref <20)
HIV-1 RNA Quant, Log: <1.3 {Log} (ref <1.30)

## 2014-04-09 ENCOUNTER — Ambulatory Visit (INDEPENDENT_AMBULATORY_CARE_PROVIDER_SITE_OTHER): Payer: Self-pay | Admitting: *Deleted

## 2014-04-09 ENCOUNTER — Encounter: Payer: Self-pay | Admitting: Internal Medicine

## 2014-04-09 ENCOUNTER — Ambulatory Visit (INDEPENDENT_AMBULATORY_CARE_PROVIDER_SITE_OTHER): Payer: Medicaid Other | Admitting: Internal Medicine

## 2014-04-09 VITALS — BP 123/68 | HR 60 | Temp 98.7°F | Wt 282.0 lb

## 2014-04-09 VITALS — BP 112/75 | HR 64 | Temp 98.2°F | Resp 16 | Ht 65.5 in | Wt 282.0 lb

## 2014-04-09 DIAGNOSIS — M549 Dorsalgia, unspecified: Secondary | ICD-10-CM

## 2014-04-09 DIAGNOSIS — B20 Human immunodeficiency virus [HIV] disease: Secondary | ICD-10-CM

## 2014-04-09 DIAGNOSIS — Z21 Asymptomatic human immunodeficiency virus [HIV] infection status: Secondary | ICD-10-CM

## 2014-04-09 LAB — HCG, SERUM, QUALITATIVE: PREG SERUM: NEGATIVE

## 2014-04-09 MED ORDER — METHOCARBAMOL 500 MG PO TABS
500.0000 mg | ORAL_TABLET | Freq: Three times a day (TID) | ORAL | Status: DC | PRN
Start: 2014-04-09 — End: 2014-07-22

## 2014-04-09 NOTE — Progress Notes (Signed)
Subjective:    Patient ID: Rachel Underwood, female    DOB: 1970-12-12, 43 y.o.   MRN: [id number]  HPI 43yo F with HIV, CD 4 count of 870/VL<20, on stribild. Doing well with adherence. Still caring for granddaughter. Enrolling in reprieve study. Having low back pain as she has had in the past, where she had previous back surgery. In the past, flexeril worked for her but does recall being sedating. Sleep improved since being placed on zestoril  Current Outpatient Prescriptions on File Prior to Visit  Medication Sig Dispense Refill  . acyclovir (ZOVIRAX) 400 MG tablet Take 1 tablet (400 mg total) by mouth 2 (two) times daily. For prophylaxis.  60 tablet  5  . atenolol (TENORMIN) 100 MG tablet Take 1 tablet (100 mg total) by mouth daily.  30 tablet  6  . elvitegravir-cobicistat-emtricitabine-tenofovir (STRIBILD) 150-150-200-300 MG TABS tablet Take 1 tablet by mouth daily with breakfast.  30 tablet  11  . ibuprofen (ADVIL,MOTRIN) 600 MG tablet Take 1 tablet (600 mg total) by mouth every 6 (six) hours as needed.  30 tablet  0  . ibuprofen (ADVIL,MOTRIN) 800 MG tablet Take 800 mg by mouth every 6 (six) hours as needed for moderate pain.      Marland Kitchen loratadine (CLARITIN) 10 MG tablet Take 10 mg by mouth daily.      . medroxyPROGESTERone (PROVERA) 10 MG tablet Take 10 mg by mouth daily. For 10 days a month.      . sertraline (ZOLOFT) 50 MG tablet Take 50 mg by mouth daily.      . trazodone (DESYREL) 300 MG tablet Take 300 mg by mouth at bedtime.       No current facility-administered medications on file prior to visit.   Active Ambulatory Problems    Diagnosis Date Noted  . Human immunodeficiency virus (HIV) disease 07/17/2013  . Schizophrenia 07/17/2013  . Bipolar disorder, unspecified 07/17/2013  . Influenza with other respiratory manifestations 01/07/2014  . HTN (hypertension) 01/15/2014   Resolved Ambulatory Problems    Diagnosis Date Noted  . No Resolved Ambulatory Problems   Past Medical  History  Diagnosis Date  . Depression   . HIV (human immunodeficiency virus infection)   . Hypertension   . Asthma   . Borderline personality disorder        Review of Systems 10 point ros is negative except what is mentioned in hpi    Objective:   Physical Exam BP 123/68  Pulse 60  Temp(Src) 98.7 F (37.1 C) (Oral)  Wt 282 lb (127.914 kg)  LMP 02/27/2014 Physical Exam  Constitutional:  oriented to person, place, and time. appears well-developed and well-nourished. No distress.  HENT:  Mouth/Throat: Oropharynx is clear and moist. No oropharyngeal exudate.  Cardiovascular: Normal rate, regular rhythm and normal heart sounds. Exam reveals no gallop and no friction rub.  No murmur heard.  Pulmonary/Chest: Effort normal and breath sounds normal. No respiratory distress.  has no wheezes.  Abdominal: Soft. Bowel sounds are normal.  exhibits no distension. There is no tenderness.  Lymphadenopathy: no cervical adenopathy.  Neurological: alert and oriented to person, place, and time.  Skin: Skin is warm and dry. No rash noted. No erythema.  Psychiatric: a normal mood and affect.  behavior is normal.         Assessment & Plan:  hiv = continue with stribild and meds for reprieve trial.  Back pain = will avoid opiates since she has past hx of addiction and will  do robaxin to ease her discomfort. She is pursuing seeing orthopedics.  rtc in  3 months

## 2014-04-09 NOTE — Progress Notes (Signed)
   Subjective:    Patient ID: Rachel Underwood, female    DOB: 19-May-1971, 43 y.o.   MRN: [id number]  HPI    Review of Systems  Constitutional: Negative.   HENT: Negative.   Eyes: Positive for visual disturbance.  Respiratory: Negative.   Cardiovascular: Negative.   Gastrointestinal: Positive for diarrhea.  Genitourinary: Negative.   Musculoskeletal: Negative.   Neurological: Negative.   Psychiatric/Behavioral: Negative.        Objective:   Physical Exam  Constitutional: She is oriented to person, place, and time. She appears well-developed and well-nourished.  HENT:  Mouth/Throat: Oropharynx is clear and moist. No oropharyngeal exudate.  Eyes: No scleral icterus.  Cardiovascular: Normal rate, regular rhythm, normal heart sounds and intact distal pulses.   Pulmonary/Chest: Effort normal and breath sounds normal.  Abdominal: Soft. Bowel sounds are normal.  Musculoskeletal: She exhibits edema.  Lymphadenopathy:    She has no cervical adenopathy.  Neurological: She is alert and oriented to person, place, and time.  Skin: Skin is warm and dry.  Psychiatric: She has a normal mood and affect. Thought content normal.          Assessment & Plan:  Patient here today to screen for the Reprieve study. Informed consent was obtained after she had time to review and read the consent. Answered all questions regarding the study. She understands it is completely voluntary and that she will be expected to keep study appointments and take the study provided medication, which may be placebo. She says she has been having loose stools and blurred vision for the past 5 days, she saw a Dr. Legrand Rams 2 days ago to evaluate slow pulse. She said he told her she had bradycardia. They drew fasting lipids that day when she saw him and I have requested results from that visit. She has +1 bilat. Pedal edema. I told her I would call her back when I received the labs and let her know if she is eligible.

## 2014-04-10 LAB — HIV 1/2 CONFIRMATION
HIV 1 ANTIBODY: POSITIVE — AB
HIV-2 Ab: NEGATIVE

## 2014-04-10 LAB — HIV ANTIBODY (ROUTINE TESTING W REFLEX): HIV 1&2 Ab, 4th Generation: REACTIVE — AB

## 2014-04-15 NOTE — Telephone Encounter (Signed)
Referral received, patient was contacted 04/08/14.  As of 04/15/14, patient returned call, scheduled appointment, and has notes and film as discussed for the upcoming appointment, 04/30/14.

## 2014-04-22 ENCOUNTER — Encounter: Payer: Self-pay | Admitting: *Deleted

## 2014-04-22 ENCOUNTER — Ambulatory Visit (INDEPENDENT_AMBULATORY_CARE_PROVIDER_SITE_OTHER): Payer: Self-pay | Admitting: *Deleted

## 2014-04-22 VITALS — BP 120/72 | HR 60 | Temp 98.3°F | Resp 16 | Ht 66.0 in | Wt 284.0 lb

## 2014-04-22 DIAGNOSIS — B20 Human immunodeficiency virus [HIV] disease: Secondary | ICD-10-CM

## 2014-04-22 DIAGNOSIS — Z21 Asymptomatic human immunodeficiency virus [HIV] infection status: Secondary | ICD-10-CM

## 2014-04-22 NOTE — Progress Notes (Signed)
Rachel Underwood is here for Entry into [phone] REPRIEVE study. Confirmed she still wants to participate in study and rechecked her eligibility. She was entered onto study. A yeasty rash noted under breast bilat. I suggested Nystatin/antifungal powder to use after shower and as needed. She can obtain this at the dollar store. ECG obtained. Questionnaires were completed and fasting blood was obtained. Vital signs are stable. Study medication was dispensed and we talked about possible side effects and how to take medication. She verbalized understanding. She received $50 gift card for her visit. Next appointment scheduled for May 19, 2014 at [facility]

## 2014-04-26 ENCOUNTER — Encounter (HOSPITAL_COMMUNITY): Payer: Self-pay | Admitting: Emergency Medicine

## 2014-04-26 ENCOUNTER — Emergency Department (HOSPITAL_COMMUNITY)
Admission: EM | Admit: 2014-04-26 | Discharge: 2014-04-26 | Disposition: A | Discharge status: Home / Self Care | Payer: Medicaid Other | Attending: Emergency Medicine | Admitting: Emergency Medicine

## 2014-04-26 DIAGNOSIS — F17200 Nicotine dependence, unspecified, uncomplicated: Secondary | ICD-10-CM | POA: Insufficient documentation

## 2014-04-26 DIAGNOSIS — Z8659 Personal history of other mental and behavioral disorders: Secondary | ICD-10-CM | POA: Insufficient documentation

## 2014-04-26 DIAGNOSIS — Z21 Asymptomatic human immunodeficiency virus [HIV] infection status: Secondary | ICD-10-CM | POA: Insufficient documentation

## 2014-04-26 DIAGNOSIS — I10 Essential (primary) hypertension: Secondary | ICD-10-CM | POA: Insufficient documentation

## 2014-04-26 DIAGNOSIS — Z79899 Other long term (current) drug therapy: Secondary | ICD-10-CM | POA: Insufficient documentation

## 2014-04-26 DIAGNOSIS — L259 Unspecified contact dermatitis, unspecified cause: Secondary | ICD-10-CM | POA: Insufficient documentation

## 2014-04-26 DIAGNOSIS — J45909 Unspecified asthma, uncomplicated: Secondary | ICD-10-CM | POA: Insufficient documentation

## 2014-04-26 MED ORDER — HYDROXYZINE HCL 25 MG PO TABS: 25.0000 mg | ORAL_TABLET | Freq: Four times a day (QID) | ORAL | Status: DC | PRN

## 2014-04-26 MED ORDER — HYDROXYZINE HCL 50 MG/ML IM SOLN: 50.0000 mg | Freq: Once | INTRAMUSCULAR | Status: DC

## 2014-04-26 MED ORDER — HYDROXYZINE HCL 25 MG PO TABS
50.0000 mg | ORAL_TABLET | Freq: Once | ORAL | Status: AC
  Administered 2014-04-26: 50 mg via ORAL
  Filled 2014-04-26: qty 2

## 2014-04-26 MED ORDER — TRIAMCINOLONE ACETONIDE 0.1 % EX CREA: TOPICAL_CREAM | CUTANEOUS | Status: DC

## 2014-04-26 NOTE — ED Notes (Signed)
Rash on back started 6 days, itching and burning

## 2014-04-26 NOTE — ED Notes (Signed)
Pt c/o intermittent rash for "years" but the rash has lasted longer this time, it started a week ago, has been using otc medication with no improvement in symptoms, no new exposures,

## 2014-04-26 NOTE — Discharge Instructions (Signed)
Contact Dermatitis Contact dermatitis is a reaction to certain substances that touch the skin. Contact dermatitis can be either irritant contact dermatitis or allergic contact dermatitis. Irritant contact dermatitis does not require previous exposure to the substance for a reaction to occur.Allergic contact dermatitis only occurs if you have been exposed to the substance before. Upon a repeat exposure, your body reacts to the substance.  CAUSES  Many substances can cause contact dermatitis. Irritant dermatitis is most commonly caused by repeated exposure to mildly irritating substances, such as:  Makeup.  Soaps.  Detergents.  Bleaches.  Acids.  Metal salts, such as nickel. Allergic contact dermatitis is most commonly caused by exposure to:  Poisonous plants.  Chemicals (deodorants, shampoos).  Jewelry.  Latex.  Neomycin in triple antibiotic cream.  Preservatives in products, including clothing. SYMPTOMS  The area of skin that is exposed may develop:  Dryness or flaking.  Redness.  Cracks.  Itching.  Pain or a burning sensation.  Blisters. With allergic contact dermatitis, there may also be swelling in areas such as the eyelids, mouth, or genitals.  DIAGNOSIS  Your caregiver can usually tell what the problem is by doing a physical exam. In cases where the cause is uncertain and an allergic contact dermatitis is suspected, a patch skin test may be performed to help determine the cause of your dermatitis. TREATMENT Treatment includes protecting the skin from further contact with the irritating substance by avoiding that substance if possible. Barrier creams, powders, and gloves may be helpful. Your caregiver may also recommend:  Steroid creams or ointments applied 2 times daily. For best results, soak the rash area in cool water for 20 minutes. Then apply the medicine. Cover the area with a plastic wrap. You can store the steroid cream in the refrigerator for a "chilly"  effect on your rash. That may decrease itching. Oral steroid medicines may be needed in more severe cases.  Antibiotics or antibacterial ointments if a skin infection is present.  Antihistamine lotion or an antihistamine taken by mouth to ease itching.  Lubricants to keep moisture in your skin.  Burow's solution to reduce redness and soreness or to dry a weeping rash. Mix one packet or tablet of solution in 2 cups cool water. Dip a clean washcloth in the mixture, wring it out a bit, and put it on the affected area. Leave the cloth in place for 30 minutes. Do this as often as possible throughout the day.  Taking several cornstarch or baking soda baths daily if the area is too large to cover with a washcloth. Harsh chemicals, such as alkalis or acids, can cause skin damage that is like a burn. You should flush your skin for 15 to 20 minutes with cold water after such an exposure. You should also seek immediate medical care after exposure. Bandages (dressings), antibiotics, and pain medicine may be needed for severely irritated skin.  HOME CARE INSTRUCTIONS  Avoid the substance that caused your reaction.  Keep the area of skin that is affected away from hot water, soap, sunlight, chemicals, acidic substances, or anything else that would irritate your skin.  Do not scratch the rash. Scratching may cause the rash to become infected.  You may take cool baths to help stop the itching.  Only take over-the-counter or prescription medicines as directed by your caregiver.  See your caregiver for follow-up care as directed to make sure your skin is healing properly. SEEK MEDICAL CARE IF:   Your condition is not better after 3  days of treatment.  You seem to be getting worse.  You see signs of infection such as swelling, tenderness, redness, soreness, . warmth in the affected area.  You have any problems related to your medicines. Document Released: 11/17/2000 Document Revised: 02/12/2012  Document Reviewed: 04/25/2011 [facility] Patient Information 2014 [place], [place].  Stop using your new shower gel as you may be sensitive to this product.  Apply the steroid cream prescribed twice daily.  Use Atarax in place of Benadryl which may give better each relief.  Use caution as this medication will make you drowsy.  I also suggest applying an anti-itch cream such as Gold Bond the steroid cream which can greatly reduce your itching.

## 2014-04-26 NOTE — ED Provider Notes (Signed)
CSN: [id number]     Arrival date & time 04/26/14  0175 History   First MD Initiated Contact with Patient 04/26/14 [phone]     Chief Complaint  Patient presents with  . Rash     (Consider location/radiation/quality/duration/timing/severity/associated sxs/prior Treatment) HPI Comments: Rachel Underwood is a 43 y.o. Female presenting with a pruritic rash which started about 6 days ago and is limited to her back.  She reports prior intermittent episodes of the same, the last occuring more than a year ago.  She reports using a new body wash about 6 days before the rash started,  Was exposed to this about twice before the symptoms began.  She denies fevers or chills, swelling, shortness of breath.  Past medical history is significant for being HIV + and having asthma.  She has tried an otc hydrocortisone cream last night and benadryl which did not relieve the itching.     The history is provided by the patient.    Past Medical History  Diagnosis Date  . Depression   . HIV (human immunodeficiency virus infection)   . Hypertension   . Asthma   . Borderline personality disorder    Past Surgical History  Procedure Laterality Date  . Ankle surgery    . Back surgery    . Shoulder surgery    . Cesarean section     Family History  Problem Relation Age of Onset  . Cancer Mother     Breast  . Hypertension Father    History  Substance Use Topics  . Smoking status: Current Every Day Smoker -- 0.50 packs/day for 30 years    Types: Cigarettes  . Smokeless tobacco: Never Used  . Alcohol Use: 0.0 oz/week     Comment: occ   OB History   Grav Para Term Preterm Abortions TAB SAB Ect Mult Living                 Review of Systems  Constitutional: Negative for fever.  HENT: Negative for congestion and sore throat.   Eyes: Negative.   Respiratory: Negative for chest tightness and shortness of breath.   Cardiovascular: Negative for chest pain.  Gastrointestinal: Negative for nausea and abdominal  pain.  Genitourinary: Negative.   Musculoskeletal: Negative for arthralgias, joint swelling and neck pain.  Skin: Positive for rash. Negative for wound.  Neurological: Negative for dizziness, weakness, light-headedness, numbness and headaches.  Psychiatric/Behavioral: Negative.       Allergies  Codeine; Haldol; and Lexapro  Home Medications   Prior to Admission medications   Medication Sig Start Date End Date Taking? Authorizing Provider  acyclovir (ZOVIRAX) 400 MG tablet Take 400 mg by mouth 2 (two) times daily as needed (for prophylaxis).   Yes Historical Provider, MD  atenolol (TENORMIN) 100 MG tablet Take 1 tablet (100 mg total) by mouth daily. 12/24/13  Yes Truman Hayward, MD  diphenhydrAMINE (SOMINEX) 25 MG tablet Take 50 mg by mouth at bedtime as needed (rash).   Yes Historical Provider, MD  elvitegravir-cobicistat-emtricitabine-tenofovir (STRIBILD) 150-150-200-300 MG TABS tablet Take 1 tablet by mouth daily with breakfast. 01/16/14  Yes Carlyle Basques, MD  gabapentin (NEURONTIN) 100 MG capsule Take 300 mg by mouth at bedtime.   Yes Historical Provider, MD  hydrOXYzine (VISTARIL) 25 MG capsule Take 50 mg by mouth at bedtime as needed (sleep).   Yes Historical Provider, MD  ibuprofen (ADVIL,MOTRIN) 800 MG tablet Take 800 mg by mouth every 6 (six) hours as needed for moderate pain.  Yes Historical Provider, MD  loratadine (CLARITIN) 10 MG tablet Take 10 mg by mouth daily.   Yes Historical Provider, MD  sertraline (ZOLOFT) 50 MG tablet Take 50 mg by mouth daily.   Yes Historical Provider, MD  trazodone (DESYREL) 300 MG tablet Take 300 mg by mouth at bedtime.   Yes Historical Provider, MD  albuterol (PROVENTIL HFA;VENTOLIN HFA) 108 (90 BASE) MCG/ACT inhaler Inhale 2 puffs into the lungs every 4 (four) hours as needed for wheezing or shortness of breath.    Historical Provider, MD  medroxyPROGESTERone (PROVERA) 10 MG tablet Take 10 mg by mouth daily. For 10 days a month.     Historical Provider, MD  methocarbamol (ROBAXIN) 500 MG tablet Take 1 tablet (500 mg total) by mouth every 8 (eight) hours as needed for muscle spasms. 04/09/14   Carlyle Basques, MD   BP 133/75  Pulse 67  Temp(Src) 98.5 F (36.9 C) (Oral)  Resp 20  SpO2 98%  LMP 03/30/2014 Physical Exam  Constitutional: She appears well-developed and well-nourished. No distress.  HENT:  Head: Normocephalic.  Neck: Neck supple.  Cardiovascular: Normal rate.   Pulmonary/Chest: Effort normal. She has no wheezes.  Musculoskeletal: Normal range of motion. She exhibits no edema.  Skin: Rash noted.  Back has scattered areas of erythema and excoriations.  She has no urticaria, vesicles.  Rash is fine,  Sandpaper quality. No drainage.    ED Course  Procedures (including critical care time) Labs Review Labs Reviewed - No data to display  Imaging Review No results found.   EKG Interpretation None      MDM   Final diagnoses:  Contact dermatitis    Patient advised to DC her current new shower gel.  She was prescribed Atarax and triamcinolone topical.  Advised followup with PCP if symptoms are not improving.    Evalee Jefferson, PA-C 04/26/14 [phone]

## 2014-04-26 NOTE — ED Provider Notes (Signed)
Medical screening examination/treatment/procedure(s) were performed by non-physician practitioner and as supervising physician I was immediately available for consultation/collaboration.   EKG Interpretation None        Wynetta Fines, MD 04/26/14 1544

## 2014-04-28 ENCOUNTER — Encounter: Payer: Self-pay | Admitting: *Deleted

## 2014-04-28 NOTE — Progress Notes (Signed)
What is her yearly rashed caused by? I notieced note form Rachel Underwood re a rash that was thought to be a yeast infection--which would not get better on steroids. It may be unrelated to study drug but I think she may need to be evaluated if this does not resolve soon

## 2014-04-28 NOTE — Progress Notes (Signed)
Saw where Rachel Underwood had gone to the ED 2 days ago for a rash on her back. I called her to check on her and see if there was any relation to the new drug we started her on. She says this has gone on for years where she will get a rash on her back or legs, it usually clears up after a week but this has lasted longer than usual. She went to her regular primary care doctor today because it had now spread to her legs. According to her he didn't think it was related to the new drug, but did give her some steroid cream to try. She said she didn't notice any effects from the pitavastatin and didn't think the rash was related to the med.

## 2014-04-30 ENCOUNTER — Ambulatory Visit (INDEPENDENT_AMBULATORY_CARE_PROVIDER_SITE_OTHER): Payer: Medicaid Other | Admitting: Orthopedic Surgery

## 2014-04-30 VITALS — BP 115/72 | Ht 65.0 in | Wt 280.0 lb

## 2014-04-30 DIAGNOSIS — M25539 Pain in unspecified wrist: Secondary | ICD-10-CM

## 2014-04-30 MED ORDER — NAPROXEN 500 MG PO TABS: 500.0000 mg | ORAL_TABLET | Freq: Two times a day (BID) | ORAL | Status: DC

## 2014-04-30 NOTE — Progress Notes (Signed)
Patient ID: Rachel Underwood, female   DOB: 1971/10/22, 43 y.o.   MRN: [id number]  Chief Complaint  Patient presents with  . Wrist Pain    Right wrist pain, no injury. referred by Dr. Legrand Rams   HISTORY: 43 year-old female with right wrist pain for several years complains of pain of the dorsum of the right hand and wrist. In the past she's had carpal tunnel syndrome. Her symptoms are nonspecific. She complains of some stiffness some aching pain over the areas described. She denies any trauma.  Allergy to codeine causes vomiting History of allergy to Haldol and Lexapro  Family history hypertension heart attack arthritis depression mental illness  Parents are alive  Medical history of asthma pneumonia hypertension depression fractures arthritis mental illness other, HIV.  Status post right ankle ligament repair, right shoulder repair, L4-5 fusion  Current medications are listed.  Denies smoking.  Review of systems positive findings under constitutional night sweats difficulty sleeping her nose and throat hearing loss ringing in ears sinusitis dental issues  Irregular heartbeat  Shortness of breath breathing difficulties  Heartburn  Musculoskeletal aches and pains multiple joints scheduled to see rheumatologist in September  Dermatitis  Headaches dizziness  Memory problems balance issues  Depression anxiety  Vital signs: BP 115/72  Ht 5\' 5"  (1.651 m)  Wt 280 lb (127.007 kg)  BMI 46.59 kg/m2  LMP 03/30/2014   General the patient is well-developed and well-nourished grooming and hygiene are normal Oriented x3 Mood and affect normal Ambulation normal  Inspection of the right wrist, she has mild tenderness over the wrist joint capsule Full range of motion All joints are stable Motor exam is normal Skin clean dry and intact  Cardiovascular exam is normal Sensory exam normal  Encounter Diagnosis  Name Primary?  . Wrist pain Yes    I will see anything on with her  wrist but immobilization for 6 weeks and Naprosyn 500 twice a day is a reasonable place to start she should keep her rheumatology appointment.

## 2014-04-30 NOTE — Patient Instructions (Addendum)
Wear brace for 6 weeks  Meds ordered this encounter  Medications  . naproxen (NAPROSYN) 500 MG tablet    Sig: Take 1 tablet (500 mg total) by mouth 2 (two) times daily with a meal.    Dispense:  60 tablet    Refill:  2  '

## 2014-05-05 ENCOUNTER — Telehealth: Payer: Self-pay | Admitting: *Deleted

## 2014-05-05 NOTE — Telephone Encounter (Signed)
Patient called stating she was seen at the ED for a rash and has not had any improvement. Asked patient if she had notified her PCP and she said yes they wanted to refer her to a dermatologist. Advised her this was an appropriate referral and to keep appointment with dermatology. Myrtis Hopping

## 2014-05-13 ENCOUNTER — Encounter: Payer: Self-pay | Admitting: Cardiovascular Disease

## 2014-05-13 ENCOUNTER — Ambulatory Visit (INDEPENDENT_AMBULATORY_CARE_PROVIDER_SITE_OTHER): Payer: Medicaid Other | Admitting: Cardiovascular Disease

## 2014-05-13 ENCOUNTER — Encounter: Payer: Self-pay | Admitting: *Deleted

## 2014-05-13 VITALS — BP 119/85 | HR 87 | Ht 65.0 in | Wt 282.0 lb

## 2014-05-13 DIAGNOSIS — R0602 Shortness of breath: Secondary | ICD-10-CM

## 2014-05-13 DIAGNOSIS — Z8249 Family history of ischemic heart disease and other diseases of the circulatory system: Secondary | ICD-10-CM

## 2014-05-13 DIAGNOSIS — R079 Chest pain, unspecified: Secondary | ICD-10-CM

## 2014-05-13 DIAGNOSIS — I10 Essential (primary) hypertension: Secondary | ICD-10-CM

## 2014-05-13 DIAGNOSIS — R002 Palpitations: Secondary | ICD-10-CM

## 2014-05-13 NOTE — Patient Instructions (Addendum)
Your physician recommends that you schedule a follow-up appointment in: 4-6 weeks with Dr Bronson Ing  Your physician has requested that you have a lexiscan myoview. For further information please visit [email or web address]. Please follow instruction sheet, as given.  Your physician has requested that you have an echocardiogram. Echocardiography is a painless test that uses sound waves to create images of your heart. It provides your doctor with information about the size and shape of your heart and how well your heart's chambers and valves are working. This procedure takes approximately one hour. There are no restrictions for this procedure.  .Your physician has recommended that you wear an event monitor. Event monitors are medical devices that record the heart's electrical activity. Doctors most often [place] these monitors to diagnose arrhythmias. Arrhythmias are problems with the speed or rhythm of the heartbeat. The monitor is a small, portable device. You can wear one while you do your normal daily activities. This is usually used to diagnose what is causing palpitations/syncope (passing out).   2 weeks

## 2014-05-13 NOTE — Progress Notes (Signed)
Patient ID: Rachel Underwood, female   DOB: 20-Oct-1971, 43 y.o.   MRN: [id number]       CARDIOLOGY CONSULT NOTE  Patient ID: Rachel Underwood MRN: [id number] DOB/AGE: 1971-02-19 43 y.o.  Admit date: (Not on file) Primary Physician FANTA,TESFAYE, MD  Reason for Consultation: chest pain and palpitations  HPI: the patient is a 43 year old female with a history of hypertension, HIV, and bipolar disorder who is referred for the evaluation of chest pain and palpitations. An ECG performed on 04/03/2014 showed sinus bradycardia, heart rate 53 beats per minute. Blood tests on 04/07/2014 demonstrated total cholesterol 160, HDL 31, triglycerides 109, LDL 107. Hemoglobin A1c 5.7%. Renal function was normal. AST 14, ALT 10. Hemoglobin normal at 13.3 with normal platelets of 180.  She tells me that she has had intermittent palpitations for the past 10 years. Her heart can sometimes start racing quickly. She moved here from [facility], [place], last July of 2014. She wore a 24-hour Holter monitor approximately 10 years ago. Saturday night she was awoken with shooting pains down her left arm but no chest pain. She said that she intermittently experiences chest pain at rest and lasting a few minutes. They can be either sharp or dull and are located retrosternally and sometimes radiating underneath the right inframammary region. She has had associated shortness of breath with both chest pain and palpitations, as well as dizziness. She said she has passed out in the distant past but no recent episodes. She denies orthopnea and paroxysmal nocturnal dyspnea.  Family Hx: Father had MI in 9s and mother had MI in her early 52s.  Allergies  Allergen Reactions  . Codeine Nausea And Vomiting  . Haldol [Haloperidol Lactate] Other (See Comments)    Slurs speech and "stumble"  . Lexapro [Escitalopram Oxalate] Rash    Current Outpatient Prescriptions  Medication Sig Dispense Refill  . acyclovir (ZOVIRAX) 400 MG tablet  Take 400 mg by mouth 2 (two) times daily as needed (for prophylaxis).      Marland Kitchen albuterol (PROVENTIL HFA;VENTOLIN HFA) 108 (90 BASE) MCG/ACT inhaler Inhale 2 puffs into the lungs every 4 (four) hours as needed for wheezing or shortness of breath.      Marland Kitchen atenolol (TENORMIN) 100 MG tablet Take 1 tablet (100 mg total) by mouth daily.  30 tablet  6  . diphenhydrAMINE (SOMINEX) 25 MG tablet Take 50 mg by mouth at bedtime as needed (rash).      . elvitegravir-cobicistat-emtricitabine-tenofovir (STRIBILD) 150-150-200-300 MG TABS tablet Take 1 tablet by mouth daily with breakfast.  30 tablet  11  . gabapentin (NEURONTIN) 100 MG capsule Take 300 mg by mouth at bedtime.      . hydrOXYzine (ATARAX/VISTARIL) 25 MG tablet Take 1-2 tablets (25-50 mg total) by mouth every 6 (six) hours as needed for itching.  30 tablet  0  . hydrOXYzine (VISTARIL) 25 MG capsule Take 50 mg by mouth at bedtime as needed (sleep).      Marland Kitchen ibuprofen (ADVIL,MOTRIN) 800 MG tablet Take 800 mg by mouth every 6 (six) hours as needed for moderate pain.      Marland Kitchen loratadine (CLARITIN) 10 MG tablet Take 10 mg by mouth daily.      . medroxyPROGESTERone (PROVERA) 10 MG tablet Take 10 mg by mouth daily. For 10 days a month.      . methocarbamol (ROBAXIN) 500 MG tablet Take 1 tablet (500 mg total) by mouth every 8 (eight) hours as needed for muscle spasms.  30 tablet  1  .  naproxen (NAPROSYN) 500 MG tablet Take 1 tablet (500 mg total) by mouth 2 (two) times daily with a meal.  60 tablet  2  . sertraline (ZOLOFT) 50 MG tablet Take 50 mg by mouth daily.      . trazodone (DESYREL) 300 MG tablet Take 300 mg by mouth at bedtime.      . triamcinolone cream (KENALOG) 0.1 % Apply sparingly to affected sites twice daily for up to one week.  30 g  0   No current facility-administered medications for this visit.    Past Medical History  Diagnosis Date  . Depression   . HIV (human immunodeficiency virus infection)   . Hypertension   . Asthma   . Borderline  personality disorder     Past Surgical History  Procedure Laterality Date  . Ankle surgery    . Back surgery    . Shoulder surgery    . Cesarean section      History   Social History  . Marital Status: Legally Separated    Spouse Name: N/A    Number of Children: N/A  . Years of Education: N/A   Occupational History  . Not on file.   Social History Main Topics  . Smoking status: Current Every Day Smoker -- 0.50 packs/day for 30 years    Types: Cigarettes  . Smokeless tobacco: Never Used  . Alcohol Use: 0.0 oz/week     Comment: occ  . 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   . Sexual Activity: Not Currently    Partners: Male    Birth Control/ Protection: Condom   Other Topics Concern  . Not on file   Social History Narrative  . No narrative on file       Prior to Admission medications   Medication Sig Start Date End Date Taking? Authorizing Provider  acyclovir (ZOVIRAX) 400 MG tablet Take 400 mg by mouth 2 (two) times daily as needed (for prophylaxis).    Historical Provider, MD  albuterol (PROVENTIL HFA;VENTOLIN HFA) 108 (90 BASE) MCG/ACT inhaler Inhale 2 puffs into the lungs every 4 (four) hours as needed for wheezing or shortness of breath.    Historical Provider, MD  atenolol (TENORMIN) 100 MG tablet Take 1 tablet (100 mg total) by mouth daily. 12/24/13   Truman Hayward, MD  diphenhydrAMINE (SOMINEX) 25 MG tablet Take 50 mg by mouth at bedtime as needed (rash).    Historical Provider, MD  elvitegravir-cobicistat-emtricitabine-tenofovir (STRIBILD) 150-150-200-300 MG TABS tablet Take 1 tablet by mouth daily with breakfast. 01/16/14   Carlyle Basques, MD  gabapentin (NEURONTIN) 100 MG capsule Take 300 mg by mouth at bedtime.    Historical Provider, MD  hydrOXYzine (ATARAX/VISTARIL) 25 MG tablet Take 1-2 tablets (25-50 mg total) by mouth every 6 (six) hours as needed for itching. 04/26/14   Evalee Jefferson, PA-C  hydrOXYzine  (VISTARIL) 25 MG capsule Take 50 mg by mouth at bedtime as needed (sleep).    Historical Provider, MD  ibuprofen (ADVIL,MOTRIN) 800 MG tablet Take 800 mg by mouth every 6 (six) hours as needed for moderate pain.    Historical Provider, MD  loratadine (CLARITIN) 10 MG tablet Take 10 mg by mouth daily.    Historical Provider, MD  medroxyPROGESTERone (PROVERA) 10 MG tablet Take 10 mg by mouth daily. For 10 days a month.    Historical Provider, MD  methocarbamol (ROBAXIN) 500 MG tablet Take 1 tablet (  500 mg total) by mouth every 8 (eight) hours as needed for muscle spasms. 04/09/14   Carlyle Basques, MD  naproxen (NAPROSYN) 500 MG tablet Take 1 tablet (500 mg total) by mouth 2 (two) times daily with a meal. 04/30/14   Carole Civil, MD  sertraline (ZOLOFT) 50 MG tablet Take 50 mg by mouth daily.    Historical Provider, MD  trazodone (DESYREL) 300 MG tablet Take 300 mg by mouth at bedtime.    Historical Provider, MD  triamcinolone cream (KENALOG) 0.1 % Apply sparingly to affected sites twice daily for up to one week. 04/26/14   Evalee Jefferson, PA-C     Review of systems complete and found to be negative unless listed above in HPI   BP 119/85 Pulse 87  Wt 282 lbs  Physical exam Last menstrual period 03/30/2014. General: NAD, obese Neck: No JVD, no thyromegaly or thyroid nodule.  Lungs: Clear to auscultation bilaterally with normal respiratory effort. CV: Nondisplaced PMI. Regular rate and rhythm, normal S1/S2, no S3/S4, no murmur.  No peripheral edema.  No carotid bruit.  Normal pedal pulses.  Abdomen: Soft, nontender, no hepatosplenomegaly, no distention.  Skin: Intact without lesions or rashes.  Neurologic: Alert and oriented x 3.  Psych: Normal affect. Extremities: No clubbing or cyanosis.  HEENT: Normal.   Labs:   Lab Results  Component Value Date   WBC 6.6 04/02/2014   HGB 12.9 04/02/2014   HCT 37.1 04/02/2014   MCV 90.5 04/02/2014   PLT 224 04/02/2014   No results found for this  basename: NA, K, CL, CO2, BUN, CREATININE, CALCIUM, LABALBU, PROT, BILITOT, ALKPHOS, ALT, AST, GLUCOSE,  in the last 168 hours No results found for this basename: CKTOTAL, CKMB, CKMBINDEX, TROPONINI    Lab Results  Component Value Date   CHOL 163 07/15/2013   Lab Results  Component Value Date   HDL 32* 07/15/2013   Lab Results  Component Value Date   LDLCALC 86 07/15/2013   Lab Results  Component Value Date   TRIG 224* 07/15/2013   Lab Results  Component Value Date   CHOLHDL 5.1 07/15/2013   No results found for this basename: LDLDIRECT         Studies: No results found.  ASSESSMENT AND PLAN:   1. Chest pain and shortness of breath: Her symptoms are somewhat atypical. However, given her strong family history of premature coronary artery disease, I will proceed with an echocardiogram a Lexiscan Cardiolite stress test for further clarification.  2. Palpitations: I will obtain a two-week event monitor to evaluate for tachyarrhythmias and premature atrial and ventricular contractions. 3. HTN: Controlled on current dose of atenolol.  Dispo: f/u 4-6 weeks.  Signed: Kate Sable, M.D., F.A.C.C.  05/13/2014, 3:12 PM

## 2014-05-19 ENCOUNTER — Ambulatory Visit (INDEPENDENT_AMBULATORY_CARE_PROVIDER_SITE_OTHER): Payer: Self-pay | Admitting: *Deleted

## 2014-05-19 VITALS — BP 103/75 | HR 91 | Temp 98.5°F | Resp 14 | Wt 286.5 lb

## 2014-05-19 DIAGNOSIS — Z21 Asymptomatic human immunodeficiency virus [HIV] infection status: Secondary | ICD-10-CM

## 2014-05-19 DIAGNOSIS — B20 Human immunodeficiency virus [HIV] disease: Secondary | ICD-10-CM

## 2014-05-19 LAB — COMPREHENSIVE METABOLIC PANEL
ALT: 13 U/L (ref 0–35)
AST: 14 U/L (ref 0–37)
Albumin: 3.8 g/dL (ref 3.5–5.2)
Alkaline Phosphatase: 61 U/L (ref 39–117)
BILIRUBIN TOTAL: 0.3 mg/dL (ref 0.2–1.2)
BUN: 9 mg/dL (ref 6–23)
CO2: 25 mEq/L (ref 19–32)
Calcium: 9.2 mg/dL (ref 8.4–10.5)
Chloride: 107 mEq/L (ref 96–112)
Creat: 0.68 mg/dL (ref 0.50–1.10)
Glucose, Bld: 100 mg/dL — ABNORMAL HIGH (ref 70–99)
Potassium: 4.7 mEq/L (ref 3.5–5.3)
Sodium: 138 mEq/L (ref 135–145)
Total Protein: 6.5 g/dL (ref 6.0–8.3)

## 2014-05-19 MED ORDER — PITAVASTATIN CALCIUM 4 MG PO TABS: ORAL_TABLET | ORAL | Status: DC

## 2014-05-19 NOTE — Progress Notes (Signed)
Rachel Underwood is here for her 1 month Reprieve study visit. She says she hasn't noticed any side effects or problems with taking the new study medication. She did notice some left leg intermittent pain yesterday, but it is gone today. She recently saw the cardiologist for bradycardia and told him she had had some left arm pain the weekend before. She is now wearing a holter monitor and has a stress test and ultrasound scheduled for evaluation. She stopped taking her neurontin, which she thinks was causing the rash and it is much better now. She will return in 3 months for the next visit.

## 2014-05-22 ENCOUNTER — Encounter (HOSPITAL_COMMUNITY): Payer: Self-pay

## 2014-05-22 ENCOUNTER — Encounter (HOSPITAL_COMMUNITY)
Admission: RE | Admit: 2014-05-22 | Discharge: 2014-05-22 | Disposition: A | Discharge status: Home / Self Care | Payer: Medicaid Other | Source: Ambulatory Visit | Attending: Cardiovascular Disease | Admitting: Cardiovascular Disease

## 2014-05-22 ENCOUNTER — Ambulatory Visit (HOSPITAL_COMMUNITY)
Admission: RE | Admit: 2014-05-22 | Discharge: 2014-05-22 | Disposition: A | Discharge status: Home / Self Care | Payer: Medicaid Other | Source: Ambulatory Visit | Attending: Cardiovascular Disease | Admitting: Cardiovascular Disease

## 2014-05-22 ENCOUNTER — Ambulatory Visit (HOSPITAL_COMMUNITY)
Admission: RE | Admit: 2014-05-22 | Discharge: 2014-05-22 | Disposition: A | Discharge status: Home / Self Care | Payer: Medicaid Other | Source: Ambulatory Visit | Attending: Cardiology | Admitting: Cardiology

## 2014-05-22 DIAGNOSIS — Z8249 Family history of ischemic heart disease and other diseases of the circulatory system: Secondary | ICD-10-CM | POA: Diagnosis not present

## 2014-05-22 DIAGNOSIS — R0609 Other forms of dyspnea: Secondary | ICD-10-CM | POA: Diagnosis not present

## 2014-05-22 DIAGNOSIS — R002 Palpitations: Secondary | ICD-10-CM | POA: Diagnosis not present

## 2014-05-22 DIAGNOSIS — I369 Nonrheumatic tricuspid valve disorder, unspecified: Secondary | ICD-10-CM

## 2014-05-22 DIAGNOSIS — R0989 Other specified symptoms and signs involving the circulatory and respiratory systems: Secondary | ICD-10-CM | POA: Insufficient documentation

## 2014-05-22 DIAGNOSIS — R079 Chest pain, unspecified: Secondary | ICD-10-CM | POA: Insufficient documentation

## 2014-05-22 MED ORDER — SODIUM CHLORIDE 0.9 % IJ SOLN
INTRAMUSCULAR | Status: AC
  Administered 2014-05-22: 10 mL via INTRAVENOUS
  Filled 2014-05-22: qty 10

## 2014-05-22 MED ORDER — SODIUM CHLORIDE 0.9 % IJ SOLN
10.0000 mL | INTRAMUSCULAR | Status: DC | PRN
  Administered 2014-05-22: 10 mL via INTRAVENOUS

## 2014-05-22 MED ORDER — TECHNETIUM TC 99M SESTAMIBI GENERIC - CARDIOLITE
10.0000 | Freq: Once | INTRAVENOUS | Status: AC | PRN
  Administered 2014-05-22: 10 via INTRAVENOUS

## 2014-05-22 MED ORDER — REGADENOSON 0.4 MG/5ML IV SOLN
0.4000 mg | Freq: Once | INTRAVENOUS | Status: AC | PRN
  Administered 2014-05-22: 0.4 mg via INTRAVENOUS

## 2014-05-22 MED ORDER — TECHNETIUM TC 99M SESTAMIBI - CARDIOLITE
30.0000 | Freq: Once | INTRAVENOUS | Status: AC | PRN
  Administered 2014-05-22: 30 via INTRAVENOUS

## 2014-05-22 MED ORDER — REGADENOSON 0.4 MG/5ML IV SOLN
INTRAVENOUS | Status: AC
  Administered 2014-05-22: 0.4 mg via INTRAVENOUS
  Filled 2014-05-22: qty 5

## 2014-05-22 NOTE — Progress Notes (Signed)
Stress Lab Nurses Notes - Rachel Underwood  Rachel Underwood 05/22/2014 Reason for doing test: Chest Pain, Dyspnea and palpitation Type of test: Wille Glaser Nurse performing test: Gerrit Halls, RN Nuclear Medicine Tech: Redmond Baseman Echo Tech: Not Applicable MD performing test: Jefm Bryant MD: Fanta Test explained and consent signed: yes IV started: 22g jelco, Saline lock flushed, No redness or edema and Saline lock started in radiology Symptoms: pressure in chest & back Treatment/Intervention: None Reason test stopped: protocol completed After recovery IV was: Discontinued via X-ray tech and No redness or edema Patient to return to [facility]. Med at : 12:15 Patient discharged: Home Patient's Condition upon discharge was: stable Comments: During test BP 124/71 & HR 104 .  Recovery BP 130/76 & HR 85.  Symptoms resolved in recovery. [name] T

## 2014-05-22 NOTE — Progress Notes (Signed)
  Echocardiogram 2D Echocardiogram has been performed.  [place], [place] 05/22/2014, 9:13 AM

## 2014-06-03 ENCOUNTER — Ambulatory Visit (INDEPENDENT_AMBULATORY_CARE_PROVIDER_SITE_OTHER): Payer: Medicaid Other | Admitting: Adult Health

## 2014-06-03 ENCOUNTER — Encounter: Payer: Self-pay | Admitting: Adult Health

## 2014-06-03 VITALS — BP 120/80 | Ht 65.0 in | Wt 286.0 lb

## 2014-06-03 DIAGNOSIS — N921 Excessive and frequent menstruation with irregular cycle: Secondary | ICD-10-CM | POA: Insufficient documentation

## 2014-06-03 DIAGNOSIS — N920 Excessive and frequent menstruation with regular cycle: Secondary | ICD-10-CM

## 2014-06-03 DIAGNOSIS — N852 Hypertrophy of uterus: Secondary | ICD-10-CM | POA: Insufficient documentation

## 2014-06-03 DIAGNOSIS — N946 Dysmenorrhea, unspecified: Secondary | ICD-10-CM

## 2014-06-03 HISTORY — DX: Dysmenorrhea, unspecified: N94.6

## 2014-06-03 HISTORY — DX: Excessive and frequent menstruation with irregular cycle: N92.1

## 2014-06-03 HISTORY — DX: Hypertrophy of uterus: N85.2

## 2014-06-03 LAB — CBC
HCT: 38.3 % (ref 36.0–46.0)
HEMOGLOBIN: 13.3 g/dL (ref 12.0–15.0)
MCH: 31.5 pg (ref 26.0–34.0)
MCHC: 34.7 g/dL (ref 30.0–36.0)
MCV: 90.8 fL (ref 78.0–100.0)
Platelets: 248 10*3/uL (ref 150–400)
RBC: 4.22 MIL/uL (ref 3.87–5.11)
RDW: 13.6 % (ref 11.5–15.5)
WBC: 8.6 10*3/uL (ref 4.0–10.5)

## 2014-06-03 NOTE — Patient Instructions (Signed)
Dysmenorrhea Menstrual cramps (dysmenorrhea) are caused by the muscles of the uterus tightening (contracting) during a menstrual period. For some women, this discomfort is merely bothersome. For others, dysmenorrhea can be severe enough to interfere with everyday activities for a few days each month. Primary dysmenorrhea is menstrual cramps that last a couple of days when you start having menstrual periods or soon after. This often begins after a teenager starts having her period. As a woman gets older or has a baby, the cramps will usually lessen or disappear. Secondary dysmenorrhea begins later in life, lasts longer, and the pain may be stronger than primary dysmenorrhea. The pain may start before the period and last a few days after the period.  CAUSES  Dysmenorrhea is usually caused by an underlying problem, such as:  The tissue lining the uterus grows outside of the uterus in other areas of the body (endometriosis).  The endometrial tissue, which normally lines the uterus, is found in or grows into the muscular walls of the uterus (adenomyosis).  The pelvic blood vessels are engorged with blood just before the menstrual period (pelvic congestive syndrome).  Overgrowth of cells (polyps) in the lining of the uterus or cervix.  Falling down of the uterus (prolapse) because of loose or stretched ligaments.  Depression.  Bladder problems, infection, or inflammation.  Problems with the intestine, a tumor, or irritable bowel syndrome.  Cancer of the female organs or bladder.  A severely tipped uterus.  A very tight opening or closed cervix.  Noncancerous tumors of the uterus (fibroids).  Pelvic inflammatory disease (PID).  Pelvic scarring (adhesions) from a previous surgery.  Ovarian cyst.  An intrauterine device (IUD) used for birth control. RISK FACTORS You may be at greater risk of dysmenorrhea if:  You are younger than age 62.  You started puberty early.  You have  irregular or heavy bleeding.  You have never given birth.  You have a family history of this problem.  You are a smoker. SIGNS AND SYMPTOMS   Cramping or throbbing pain in your lower abdomen.  Headaches.  Lower back pain.  Nausea or vomiting.  Diarrhea.  Sweating or dizziness.  Loose stools. DIAGNOSIS  A diagnosis is based on your history, symptoms, physical exam, diagnostic tests, or procedures. Diagnostic tests or procedures may include:  Blood tests.  Ultrasonography.  An examination of the lining of the uterus (dilation and curettage, D&C).  An examination inside your abdomen or pelvis with a scope (laparoscopy).  X-rays.  CT scan.  MRI.  An examination inside the bladder with a scope (cystoscopy).  An examination inside the intestine or stomach with a scope (colonoscopy, gastroscopy). TREATMENT  Treatment depends on the cause of the dysmenorrhea. Treatment may include:  Pain medicine prescribed by your health care provider.  Birth control pills or an IUD with progesterone hormone in it.  Hormone replacement therapy.  Nonsteroidal anti-inflammatory drugs (NSAIDs). These may help stop the production of prostaglandins.  Surgery to remove adhesions, endometriosis, ovarian cyst, or fibroids.  Removal of the uterus (hysterectomy).  Progesterone shots to stop the menstrual period.  Cutting the nerves on the sacrum that go to the female organs (presacral neurectomy).  Electric current to the sacral nerves (sacral nerve stimulation).  Antidepressant medicine.  Psychiatric therapy, counseling, or group therapy.  Exercise and physical therapy.  Meditation and yoga therapy.  Acupuncture. HOME CARE INSTRUCTIONS   Only take over-the-counter or prescription medicines as directed by your health care provider.  Place a heating pad  or hot water bottle on your lower back or abdomen. Do not sleep with the heating pad.  Use aerobic exercises, walking,  swimming, biking, and other exercises to help lessen the cramping.  Massage to the lower back or abdomen may help.  Stop smoking.  Avoid alcohol and caffeine. SEEK MEDICAL CARE IF:   Your pain does not get better with medicine.  You have pain with sexual intercourse.  Your pain increases and is not controlled with medicines.  You have abnormal vaginal bleeding with your period.  You develop nausea or vomiting with your period that is not controlled with medicine. SEEK IMMEDIATE MEDICAL CARE IF:  You pass out.  Document Released: 11/20/2005 Document Revised: 07/23/2013 Document Reviewed: 05/08/2013 [employer] Patient Information 2015 [employer]. This information is not intended to replace advice given to you by your health care provider. Make sure you discuss any questions you have with your health care provider. Menorrhagia Menorrhagia is the medical term for when your menstrual periods are heavy or last longer than usual. With menorrhagia, every period you have may cause enough blood loss and cramping that you are unable to maintain your usual activities. CAUSES  In some cases, the cause of heavy periods is unknown, but a number of conditions may cause menorrhagia. Common causes include:  A problem with the hormone-producing thyroid gland (hypothyroid).  Noncancerous growths in the uterus (polyps or fibroids).  An imbalance of the estrogen and progesterone hormones.  One of your ovaries not releasing an egg during one or more months.  Side effects of having an intrauterine device (IUD).  Side effects of some medicines, such as anti-inflammatory medicines or blood thinners.  A bleeding disorder that stops your blood from clotting normally. SIGNS AND SYMPTOMS  During a normal period, bleeding lasts between 4 and 8 days. Signs that your periods are too heavy include:  You routinely have to change your pad or tampon every 1 or 2 hours because it is completely soaked.  You  pass blood clots larger than 1 inch (2.5 cm) in size.  You have bleeding for more than 7 days.  You need to use pads and tampons at the same time because of heavy bleeding.  You need to wake up to change your pads or tampons during the night.  You have symptoms of anemia, such as tiredness, fatigue, or shortness of breath. DIAGNOSIS  Your health care provider will perform a physical exam and ask you questions about your symptoms and menstrual history. Other tests may be ordered based on what the health care provider finds during the exam. These tests can include:  Blood tests. Blood tests are used to check if you are pregnant or have hormonal changes, a bleeding or thyroid disorder, low iron levels (anemia), or other problems.  Endometrial biopsy. Your health care provider takes a sample of tissue from the inside of your uterus to be examined under a microscope.  Pelvic ultrasound. This test uses sound waves to make a picture of your uterus, ovaries, and vagina. The pictures can show if you have fibroids or other growths.  Hysteroscopy. For this test, your health care provider will use a small telescope to look inside your uterus. Based on the results of your initial tests, your health care provider may recommend further testing. TREATMENT  Treatment may not be needed. If it is needed, your health care provider may recommend treatment with one or more medicines first. If these do not reduce bleeding enough, a surgical treatment   might be an option. The best treatment for you will depend on:   Whether you need to prevent pregnancy.  Your desire to have children in the future.  The cause and severity of your bleeding.  Your opinion and personal preference.  Medicines for menorrhagia may include:  Birth control methods that use hormones. These include the pill, skin patch, vaginal ring, shots that you get every 3 months, hormonal IUD, and implant. These treatments reduce bleeding  during your menstrual period.  Medicines that thicken blood and slow bleeding.  Medicines that reduce swelling, such as ibuprofen.  Medicines that contain a synthetic hormone called progestin.   Medicines that make the ovaries stop working for a short time.  You may need surgical treatment for menorrhagia if the medicines are unsuccessful. Treatment options include:  Dilation and curettage (D&C). In this procedure, your health care provider opens (dilates) your cervix and then scrapes or suctions tissue from the lining of your uterus to reduce menstrual bleeding.  Operative hysteroscopy. This procedure uses a tiny tube with a light (hysteroscope) to view your uterine cavity and can help in the surgical removal of a polyp that may be causing heavy periods.  Endometrial ablation. Through various techniques, your health care provider permanently destroys the entire lining of your uterus (endometrium). After endometrial ablation, most women have little or no menstrual flow. Endometrial ablation reduces your ability to become pregnant.  Endometrial resection. This surgical procedure uses an electrosurgical wire loop to remove the lining of the uterus. This procedure also reduces your ability to become pregnant.  Hysterectomy. Surgical removal of the uterus and cervix is a permanent procedure that stops menstrual periods. Pregnancy is not possible after a hysterectomy. This procedure requires anesthesia and hospitalization. HOME CARE INSTRUCTIONS   Only take over-the-counter or prescription medicines as directed by your health care provider. Take prescribed medicines exactly as directed. Do not change or switch medicines without consulting your health care provider.  Take any prescribed iron pills exactly as directed by your health care provider. Long-term heavy bleeding may result in low iron levels. Iron pills help replace the iron your body lost from heavy bleeding. Iron may cause  constipation. If this becomes a problem, increase the bran, fruits, and roughage in your diet.  Do not take aspirin or medicines that contain aspirin 1 week before or during your menstrual period. Aspirin may make the bleeding worse.  If you need to change your sanitary pad or tampon more than once every 2 hours, stay in bed and rest as much as possible until the bleeding stops.  Eat well-balanced meals. Eat foods high in iron. Examples are leafy green vegetables, meat, liver, eggs, and whole grain breads and cereals. Do not try to lose weight until the abnormal bleeding has stopped and your blood iron level is back to normal. SEEK MEDICAL CARE IF:   You soak through a pad or tampon every 1 or 2 hours, and this happens every time you have a period.  You need to use pads and tampons at the same time because you are bleeding so much.  You need to change your pad or tampon during the night.  You have a period that lasts for more than 8 days.  You pass clots bigger than 1 inch wide.  You have irregular periods that happen more or less often than once a month.  You feel dizzy or faint.  You feel very weak or tired.  You feel short of breath or   feel your heart is beating too fast when you exercise.  You have nausea and vomiting or diarrhea while you are taking your medicine.  You have any problems that may be related to the medicine you are taking. SEEK IMMEDIATE MEDICAL CARE IF:   You soak through 4 or more pads or tampons in 2 hours.  You have any bleeding while you are pregnant. MAKE SURE YOU:   Understand these instructions.  Will watch your condition.  Will get help right away if you are not doing well or get worse. Document Released: 11/20/2005 Document Revised: 11/25/2013 Document Reviewed: 05/11/2013 [facility] Patient Information 2015 [place], [place]. This information is not intended to replace advice given to you by your health care provider. Make sure you discuss any  questions you have with your health care provider. Return in 1 week for [place] and see me

## 2014-06-03 NOTE — Progress Notes (Signed)
Subjective:     Patient ID: Rachel Underwood, female   DOB: December 04, 1971, 43 y.o.   MRN: [id number]  HPI Navada is a 43 year old white female, new to this practice, in complaining of bleeding for 28 days, she went 6 months last year with out a period and was given provera 10 mg x 10 days each month by Dr Bettina Gavia in [place].She is passing clots and has cramps.She is also HIV+.   Review of Systems See HPI Reviewed past medical,surgical, social and family history. Reviewed medications and allergies.     Objective:   Physical Exam BP 120/80  Ht 5\' 5"  (1.651 m)  Wt 286 lb (129.729 kg)  BMI 47.59 kg/m2  LMP 05/06/2014   Skin warm and dry.Pelvic: external genitalia is normal in appearance, vagina: period like blood without odor, cervix is  Bulbous, -CMT, uterus:feels enlarged, may be about 16 weeks,but difficult exam due to abdominal girth, and is tender, no masses felt, adnexa: no masses or tenderness noted.She says she thinks a hysterectomy will fix this.  Assessment:     Menorrhagia Dysmenorrhea Enlarged uterus     Plan:     Check CBC,CMP,TSH and FSH Return in 1 week for [place] and see me Review handout on menorrhagia and dysmenorrhea

## 2014-06-04 ENCOUNTER — Telehealth: Payer: Self-pay | Admitting: Adult Health

## 2014-06-04 LAB — COMPREHENSIVE METABOLIC PANEL
ALBUMIN: 4 g/dL (ref 3.5–5.2)
ALT: 13 U/L (ref 0–35)
AST: 14 U/L (ref 0–37)
Alkaline Phosphatase: 64 U/L (ref 39–117)
BUN: 8 mg/dL (ref 6–23)
CALCIUM: 9.5 mg/dL (ref 8.4–10.5)
CHLORIDE: 104 meq/L (ref 96–112)
CO2: 26 meq/L (ref 19–32)
CREATININE: 0.73 mg/dL (ref 0.50–1.10)
GLUCOSE: 86 mg/dL (ref 70–99)
Potassium: 5.2 mEq/L (ref 3.5–5.3)
Sodium: 137 mEq/L (ref 135–145)
Total Bilirubin: 0.4 mg/dL (ref 0.2–1.2)
Total Protein: 6.6 g/dL (ref 6.0–8.3)

## 2014-06-04 LAB — FOLLICLE STIMULATING HORMONE: FSH: 10.8 m[IU]/mL

## 2014-06-04 LAB — TSH: TSH: 1.381 u[IU]/mL (ref 0.350–4.500)

## 2014-06-04 NOTE — Telephone Encounter (Signed)
Pt aware of labs,can take tylenol and use heating pad

## 2014-06-09 ENCOUNTER — Encounter: Payer: Self-pay | Admitting: Internal Medicine

## 2014-06-10 ENCOUNTER — Encounter: Payer: Self-pay | Admitting: Adult Health

## 2014-06-10 ENCOUNTER — Ambulatory Visit (INDEPENDENT_AMBULATORY_CARE_PROVIDER_SITE_OTHER): Payer: Medicaid Other

## 2014-06-10 ENCOUNTER — Other Ambulatory Visit: Payer: Self-pay | Admitting: Adult Health

## 2014-06-10 ENCOUNTER — Ambulatory Visit (INDEPENDENT_AMBULATORY_CARE_PROVIDER_SITE_OTHER): Payer: Medicaid Other | Admitting: Adult Health

## 2014-06-10 VITALS — BP 110/62 | Ht 65.0 in | Wt 286.0 lb

## 2014-06-10 DIAGNOSIS — N920 Excessive and frequent menstruation with regular cycle: Secondary | ICD-10-CM

## 2014-06-10 DIAGNOSIS — N946 Dysmenorrhea, unspecified: Secondary | ICD-10-CM

## 2014-06-10 DIAGNOSIS — N921 Excessive and frequent menstruation with irregular cycle: Secondary | ICD-10-CM

## 2014-06-10 DIAGNOSIS — N852 Hypertrophy of uterus: Secondary | ICD-10-CM

## 2014-06-10 NOTE — Progress Notes (Signed)
Subjective:     Patient ID: Rachel Underwood, female   DOB: November 03, 1971, 43 y.o.   MRN: [id number]  HPI Rachel Underwood is a 43 year old white female in for [place] for menorrhagia and dysmenorrhea.  Review of Systems See HPI Reviewed past medical,surgical, social and family history. Reviewed medications and allergies.     Objective:   Physical Exam BP 110/62  Ht 5\' 5"  (1.651 m)  Wt 286 lb (129.729 kg)  BMI 47.59 kg/m2  LMP 06/03/2015Reviewed [place] with pt and Dr Glo Herring. Uterus 8.3 x 6.8 x 5.6 cm, no myometrial masses noted  Endometrium 6.9 mm, symmetrical,  Right ovary 3.8 x 2.8 x 2.5 cm, with 2.3 cm simple follicular cyst  Left ovary 2.5 x 1.9 x 1.4 cm,  No free fluid noted within the pelvis  Technician Comments:  Anteverted uterus, Endom-6.65mm, Rt ovary with 4.0GQ simple follicular cyst noted, Lt ovary appears WNL, no free fluid noted within the pelvis  Offered megace but she declines, and stopped provera, discussed endo ablation, but she thinks hysterectomy is way to go, but will do ablation first.       Assessment:     Menorrhagia Dysmenorrhea     Plan:     Return in 1 week to discuss endometrial ablation with Dr Glo Herring   Review handout on endo ablation

## 2014-06-10 NOTE — Patient Instructions (Signed)
Endometrial Ablation Endometrial ablation removes the lining of the uterus (endometrium). It is usually a same-day, outpatient treatment. Ablation helps avoid major surgery, such as surgery to remove the cervix and uterus (hysterectomy). After endometrial ablation, you will have little or no menstrual bleeding and may not be able to have children. However, if you are premenopausal, you will need to use a reliable method of birth control following the procedure because of the small chance that pregnancy can occur. There are different reasons to have this procedure, which include:  Heavy periods.  Bleeding that is causing anemia.  Irregular bleeding.  Bleeding fibroids on the lining inside the uterus if they are smaller than 3 centimeters. This procedure should not be done if:  You want children in the future.  You have severe cramps with your menstrual period.  You have precancerous or cancerous cells in your uterus.  You were recently pregnant.  You have gone through menopause.  You have had major surgery on the uterus, such as a cesarean delivery. LET [facility] CARE PROVIDER KNOW ABOUT:  Any allergies you have.  All medicines you are taking, including vitamins, herbs, eye drops, creams, and over-the-counter medicines.  Previous problems you or members of your family have had with the use of anesthetics.  Any blood disorders you have.  Previous surgeries you have had.  Medical conditions you have. RISKS AND COMPLICATIONS  Generally, this is a safe procedure. However, as with any procedure, complications can occur. Possible complications include:  Perforation of the uterus.  Bleeding.  Infection of the uterus, bladder, or vagina.  Injury to surrounding organs.  An air bubble to the lung (air embolus).  Pregnancy following the procedure.  Failure of the procedure to help the problem, requiring hysterectomy.  Decreased ability to diagnose cancer in the lining of  the uterus. BEFORE THE PROCEDURE  The lining of the uterus must be tested to make sure there is no pre-cancerous or cancer cells present.  An ultrasound may be performed to look at the size of the uterus and to check for abnormalities.  Medicines may be given to thin the lining of the uterus. PROCEDURE  During the procedure, your health care provider will use a tool called a resectoscope to help see inside your uterus. There are different ways to remove the lining of your uterus.   Radiofrequency - This method uses a radiofrequency-alternating electric current to remove the lining of the uterus.  Cryotherapy - This method uses extreme cold to freeze the lining of the uterus.  Heated-Free Liquid - This method uses heated salt (saline) solution to remove the lining of the uterus.  Microwave - This method uses high-energy microwaves to heat up the lining of the uterus to remove it.  Thermal balloon - This method involves inserting a catheter with a balloon tip into the uterus. The balloon tip is filled with heated fluid to remove the lining of the uterus. AFTER THE PROCEDURE  After your procedure, do not have sexual intercourse or insert anything into your vagina until permitted by your health care provider. After the procedure, you may experience:  Cramps.  Vaginal discharge.  Frequent urination. Document Released: 09/29/2004 Document Revised: 07/23/2013 Document Reviewed: 04/23/2013 [facility] Patient Information 2015 [place], [place]. This information is not intended to replace advice given to you by your health care provider. Make sure you discuss any questions you have with your health care provider. Return in  1 week to see Dr Glo Herring

## 2014-06-16 ENCOUNTER — Other Ambulatory Visit: Payer: Self-pay | Admitting: *Deleted

## 2014-06-16 DIAGNOSIS — R079 Chest pain, unspecified: Secondary | ICD-10-CM

## 2014-06-16 DIAGNOSIS — Z8249 Family history of ischemic heart disease and other diseases of the circulatory system: Secondary | ICD-10-CM

## 2014-06-17 ENCOUNTER — Telehealth: Payer: Self-pay | Admitting: Obstetrics and Gynecology

## 2014-06-17 NOTE — Telephone Encounter (Signed)
Pt states that she was reading her pt instructions from her last visit and was concerned when she read the part about having a c-section, or severe cramping with her periods should not have the surgery. The Pt stated that she has an appointment tomorrow with Dr. Glo Herring. I advised the pt to read over the instructions again and write down any questions that she may have and bring that with her when she comes tomorrow.

## 2014-06-18 ENCOUNTER — Ambulatory Visit (INDEPENDENT_AMBULATORY_CARE_PROVIDER_SITE_OTHER): Payer: Medicaid Other | Admitting: Obstetrics and Gynecology

## 2014-06-18 ENCOUNTER — Encounter: Payer: Self-pay | Admitting: Obstetrics and Gynecology

## 2014-06-18 VITALS — BP 108/62 | Ht 65.0 in | Wt 285.0 lb

## 2014-06-18 DIAGNOSIS — N970 Female infertility associated with anovulation: Secondary | ICD-10-CM | POA: Insufficient documentation

## 2014-06-18 DIAGNOSIS — R109 Unspecified abdominal pain: Secondary | ICD-10-CM

## 2014-06-18 DIAGNOSIS — N938 Other specified abnormal uterine and vaginal bleeding: Secondary | ICD-10-CM

## 2014-06-18 DIAGNOSIS — N949 Unspecified condition associated with female genital organs and menstrual cycle: Secondary | ICD-10-CM

## 2014-06-18 MED ORDER — MEDROXYPROGESTERONE ACETATE 10 MG PO TABS: 10.0000 mg | ORAL_TABLET | Freq: Every day | ORAL | Status: DC
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
## 2014-07-01 ENCOUNTER — Ambulatory Visit: Payer: Medicaid Other | Admitting: Obstetrics and Gynecology

## 2014-07-03 ENCOUNTER — Encounter: Payer: Self-pay | Admitting: Obstetrics and Gynecology

## 2014-07-03 ENCOUNTER — Ambulatory Visit (INDEPENDENT_AMBULATORY_CARE_PROVIDER_SITE_OTHER): Payer: Medicaid Other | Admitting: Obstetrics and Gynecology

## 2014-07-03 VITALS — BP 106/60 | Ht 65.0 in | Wt 285.8 lb

## 2014-07-03 DIAGNOSIS — F209 Schizophrenia, unspecified: Secondary | ICD-10-CM

## 2014-07-03 DIAGNOSIS — Z30430 Encounter for insertion of intrauterine contraceptive device: Secondary | ICD-10-CM

## 2014-07-03 DIAGNOSIS — Z3202 Encounter for pregnancy test, result negative: Secondary | ICD-10-CM

## 2014-07-03 LAB — POCT URINE PREGNANCY: PREG TEST UR: NEGATIVE

## 2014-07-03 MED ORDER — PHENTERMINE HCL 37.5 MG PO CAPS: 37.5000 mg | ORAL_CAPSULE | ORAL | Status: DC

## 2014-07-03 NOTE — Progress Notes (Signed)
Patient ID: Rachel Underwood, female   DOB: 03-25-1971, 43 y.o.   MRN: [id number]   [facility] Visit  Patient name: Rachel Underwood MRN [id number]  Date of birth: 1971/08/25  CC & HPI:  Rachel Underwood is a 43 y.o. female presenting today for  IUD insert UNABLE TO COMPLETE DUE TO ANTERIOR POSITION OF CERVIX. Pt reports havng "a lot" of discomfort on daily basis in lower abdomen,  Worse with ad contact.  ROS:  Soc Hx :married x 8 yr, in relationship x 13 yr , He's HIV Negative despite not using condom.    Pertinent History Reviewed:   Reviewed: Significant for chronic lower abd pain Medical         Past Medical History  Diagnosis Date  . Depression   . HIV (human immunodeficiency virus infection)   . Hypertension   . Asthma   . Borderline personality disorder   . Menorrhagia with irregular cycle 06/03/2014  . Dysmenorrhea 06/03/2014  . Enlarged uterus 06/03/2014  . Herpes simplex without mention of complication                               Surgical Hx:    Past Surgical History  Procedure Laterality Date  . Ankle surgery    . Back surgery    . Shoulder surgery    . Cesarean section    c/s edction 1994 with first baby, has 2 deliveries VBAC, then CKC done 2004-2005 Medications: Reviewed & Updated - see associated section                      Current outpatient prescriptions:acyclovir (ZOVIRAX) 400 MG tablet, Take 400 mg by mouth 2 (two) times daily as needed (for prophylaxis)., Disp: , Rfl: ;  albuterol (PROVENTIL HFA;VENTOLIN HFA) 108 (90 BASE) MCG/ACT inhaler, Inhale 2 puffs into the lungs every 4 (four) hours as needed for wheezing or shortness of breath., Disp: , Rfl: ;  atenolol (TENORMIN) 100 MG tablet, Take 1 tablet (100 mg total) by mouth daily., Disp: 30 tablet, Rfl: 6 elvitegravir-cobicistat-emtricitabine-tenofovir (STRIBILD) 150-150-200-300 MG TABS tablet, Take 1 tablet by mouth daily with breakfast., Disp: 30 tablet, Rfl: 11;  hydrOXYzine (ATARAX/VISTARIL) 50 MG tablet, Take  50 mg by mouth 3 (three) times daily as needed. Takes BID-TID daily, Disp: , Rfl: ;  ibuprofen (ADVIL,MOTRIN) 800 MG tablet, Take 800 mg by mouth every 6 (six) hours as needed for moderate pain., Disp: , Rfl:  medroxyPROGESTERone (PROVERA) 10 MG tablet, Take 1 tablet (10 mg total) by mouth daily. For 10 days a month., Disp: 10 tablet, Rfl: 1;  Pitavastatin Calcium 4 MG TABS, Pitavastatin 4mg  or placebo, Disp: 30 tablet, Rfl: 11;  sertraline (ZOLOFT) 100 MG tablet, Take 100 mg by mouth daily. Takes 2 daily, Disp: , Rfl: ;  esomeprazole (NEXIUM) 40 MG capsule, Take 40 mg by mouth daily at 12 noon., Disp: , Rfl:  methocarbamol (ROBAXIN) 500 MG tablet, Take 1 tablet (500 mg total) by mouth every 8 (eight) hours as needed for muscle spasms., Disp: 30 tablet, Rfl: 1;  naproxen (NAPROSYN) 500 MG tablet, Take 1 tablet (500 mg total) by mouth 2 (two) times daily with a meal., Disp: 60 tablet, Rfl: 2   Social History: Reviewed -  reports that she has been smoking Cigarettes.  She has a 15 pack-year smoking history. She has never used smokeless tobacco.  Objective Findings:  Vitals: Blood pressure  106/60, height 5\' 5"  (1.651 m), weight 285 lb 12.8 oz (129.638 kg), last menstrual period 05/06/2014. Body mass index is 47.56 kg/(m^2).  Physical Examination: General appearance - alert, well appearing, and in no distress, overweight and unmotivated for weight loss. Weight was 350. Abdomen - soft, nontender, nondistended, no masses or organomegaly Exam limited by abd girth. Pelvic - exam limited by habitus. The cervix is very far forward, can be felt, but barely seen, pt unable to tolerate tenaculum use. I cannot insert IUD due to visibility issues.   Assessment & Plan:   A:  Chronic uterine sensitivity may need to consider hyst. After wt loss. 1. Unable to insert IUD due to cervix position and sensitivety 2. Morbid obesity.  P:  1. Weight loss advised preop 2. Rx phentermine 37.5  Recheck 4  wk.

## 2014-07-06 ENCOUNTER — Telehealth: Payer: Self-pay | Admitting: *Deleted

## 2014-07-06 NOTE — Telephone Encounter (Signed)
Advised pt to call Dr. Johnnye Sima office.  No new medications through this office since Feb., 2015.  New medications started at Dr. Johnnye Sima office in July.  Pt verbalized understanding.

## 2014-07-08 ENCOUNTER — Ambulatory Visit: Payer: Medicaid Other | Admitting: Cardiovascular Disease

## 2014-07-22 ENCOUNTER — Encounter: Payer: Self-pay | Admitting: Obstetrics and Gynecology

## 2014-07-22 ENCOUNTER — Ambulatory Visit (INDEPENDENT_AMBULATORY_CARE_PROVIDER_SITE_OTHER): Payer: Medicaid Other | Admitting: Obstetrics and Gynecology

## 2014-07-22 VITALS — BP 114/72 | Ht 65.0 in | Wt 281.0 lb

## 2014-07-22 DIAGNOSIS — N949 Unspecified condition associated with female genital organs and menstrual cycle: Secondary | ICD-10-CM

## 2014-07-22 DIAGNOSIS — R102 Pelvic and perineal pain unspecified side: Secondary | ICD-10-CM | POA: Insufficient documentation

## 2014-07-22 DIAGNOSIS — N938 Other specified abnormal uterine and vaginal bleeding: Secondary | ICD-10-CM

## 2014-07-22 NOTE — Patient Instructions (Signed)
Endometrial Biopsy Endometrial biopsy is a procedure in which a tissue sample is taken from inside the uterus. The tissue sample is then looked at under a microscope to see if the tissue is normal or abnormal. The endometrium is the lining of the uterus. This procedure helps determine where you are in your menstrual cycle and how hormone levels are affecting the lining of the uterus. This procedure may also be used to evaluate uterine bleeding or to diagnose endometrial cancer, tuberculosis, polyps, or inflammatory conditions.  LET YOUR HEALTH CARE PROVIDER KNOW ABOUT:  Any allergies you have.  All medicines you are taking, including vitamins, herbs, eye drops, creams, and over-the-counter medicines.  Previous problems you or members of your family have had with the use of anesthetics.  Any blood disorders you have.  Previous surgeries you have had.  Medical conditions you have.  Possibility of pregnancy. RISKS AND COMPLICATIONS Generally, this is a safe procedure. However, as with any procedure, complications can occur. Possible complications include:  Bleeding.  Pelvic infection.  Puncture of the uterine wall with the biopsy device (rare). BEFORE THE PROCEDURE   Keep a record of your menstrual cycles as directed by your health care provider. You may need to schedule your procedure for a specific time in your cycle.  You may want to bring a sanitary pad to wear home after the procedure.  Arrange for someone to drive you home after the procedure if you will be given a medicine to help you relax (sedative). PROCEDURE   You may be given a sedative to relax you.  You will lie on an exam table with your feet and legs supported as in a pelvic exam.  Your health care provider will insert an instrument (speculum) into your vagina to see your cervix.  Your cervix will be cleansed with an antiseptic solution. A medicine (local anesthetic) will be used to numb the cervix.  A forceps  instrument (tenaculum) will be used to hold your cervix steady for the biopsy.  A thin, rodlike instrument (uterine sound) will be inserted through your cervix to determine the length of your uterus and the location where the biopsy sample will be removed.  A thin, flexible tube (catheter) will be inserted through your cervix and into the uterus. The catheter is used to collect the biopsy sample from your endometrial tissue.  The catheter and speculum will then be removed, and the tissue sample will be sent to a lab for examination. AFTER THE PROCEDURE  You will rest in a recovery area until you are ready to go home.  You may have mild cramping and a small amount of vaginal bleeding for a few days after the procedure. This is normal.  Make sure you find out how to get your test results. Document Released: 03/23/2005 Document Revised: 07/23/2013 Document Reviewed: 05/07/2013 [employer] Patient Information 2015 [employer]. This information is not intended to replace advice given to you by your health care provider. Make sure you discuss any questions you have with your health care provider.  

## 2014-07-22 NOTE — Progress Notes (Signed)
This chart was scribed by Ludger Nutting, Medical Scribe, for Dr. Mallory Shirk on 07/22/14 at 1:46 PM. This chart was reviewed by Dr. Mallory Shirk for accuracy.  [facility] Visit  Patient name: Rachel Underwood MRN [id number]  Date of birth: [birth year]  CC & HPI:  Rachel Underwood is a 43 y.o. female presenting today for follow up of : pelvic pain, dysmenorrhea, in this HIV + patient(negative viral load at present). Patient was seen on 07/03/14 to insert IUD but was unsuccessful. Patient was interested in a hysterectomy.she was started on phentermine 37.5 to assist with weight loss prior to surgery. She is here 4 weeks later with a 4 lb weight loss. She reports starting her period on August 1st and continues to bleed today. She states the periods are not particularly heavy but states they last too long and she has associated, constant unbearable abdominal cramping.   She is chronically sensitive on exam, and not interested in endometrial ablation. ROS:  +dysmenorrhea  +abdominal cramping Pt emphatically opposed to endometrial ablation options, just "wants it all out" due to hx of recurrent LAP, ever since first period Hx cesarean section ,with wound requiring being opened up a few days later due to "something ruptured". "something to do with the fascia." [facility] , [place] 1994 october Pertinent History Reviewed:   Reviewed: Significant for  Medical         Past Medical History  Diagnosis Date  . Depression   . HIV (human immunodeficiency virus infection)   . Hypertension   . Asthma   . Borderline personality disorder   . Menorrhagia with irregular cycle 06/03/2014  . Dysmenorrhea 06/03/2014  . Enlarged uterus 06/03/2014  . Herpes simplex without mention of complication   . Abnormal Pap smear of cervix 2005(EST)    ckc for abnl pap- [facility] [place]                              Surgical Hx:    Past Surgical History  Procedure Laterality Date  . Ankle surgery    .  Back surgery    . Shoulder surgery    . Cesarean section    . Ckc     Medications: Reviewed & Updated - see associated section                      Current outpatient prescriptions:acyclovir (ZOVIRAX) 400 MG tablet, Take 400 mg by mouth 2 (two) times daily as needed (for prophylaxis)., Disp: , Rfl: ;  albuterol (PROVENTIL HFA;VENTOLIN HFA) 108 (90 BASE) MCG/ACT inhaler, Inhale 2 puffs into the lungs every 4 (four) hours as needed for wheezing or shortness of breath., Disp: , Rfl: ;  atenolol (TENORMIN) 100 MG tablet, Take 1 tablet (100 mg total) by mouth daily., Disp: 30 tablet, Rfl: 6 elvitegravir-cobicistat-emtricitabine-tenofovir (STRIBILD) 150-150-200-300 MG TABS tablet, Take 1 tablet by mouth daily with breakfast., Disp: 30 tablet, Rfl: 11;  hydrOXYzine (ATARAX/VISTARIL) 50 MG tablet, Take 50 mg by mouth 3 (three) times daily as needed. Takes BID-TID daily, Disp: , Rfl: ;  ibuprofen (ADVIL,MOTRIN) 800 MG tablet, Take 800 mg by mouth every 6 (six) hours as needed for moderate pain., Disp: , Rfl:  medroxyPROGESTERone (PROVERA) 10 MG tablet, Take 1 tablet (10 mg total) by mouth daily. For 10 days a month., Disp: 10 tablet, Rfl: 1;  Pitavastatin Calcium 4 MG TABS, Pitavastatin 4mg  or placebo,  Disp: 30 tablet, Rfl: 11;  sertraline (ZOLOFT) 100 MG tablet, Take 100 mg by mouth daily. Takes 2 daily, Disp: , Rfl: ;  esomeprazole (NEXIUM) 40 MG capsule, Take 40 mg by mouth daily at 12 noon., Disp: , Rfl:  phentermine 37.5 MG capsule, Take 1 capsule (37.5 mg total) by mouth every morning., Disp: 30 capsule, Rfl: 0   Social History: Reviewed -  reports that she has been smoking Cigarettes.  She has a 30 pack-year smoking history. She has never used smokeless tobacco.  Objective Findings:  Vitals: Blood pressure 114/72, height 5\' 5"  (1.651 m), weight 281 lb (127.461 kg), last menstrual period 07/04/2014.  Physical Examination: General appearance - alert, well appearing, and in no distress and oriented to  person, place, and time Mental status - alert, oriented to person, place, and time, normal mood, behavior, speech, dress, motor activity, and thought processes Pelvic - examination not indicated  Patient not interested in endometrial ablation due to adverse reactions from friends and relatives. She requests hysterectomy.  Assessment & Plan:   A:  1. DUB 2. Dysmenorrhea 3. HIV pos with low viral load  P:  1. Endometrial biopsy in 1-2 weeks  2. Evaluate pt for hysterectomy

## 2014-07-29 ENCOUNTER — Ambulatory Visit (HOSPITAL_COMMUNITY)
Admission: RE | Admit: 2014-07-29 | Discharge: 2014-07-29 | Disposition: A | Discharge status: Home / Self Care | Payer: Medicaid Other | Source: Ambulatory Visit | Attending: Internal Medicine | Admitting: Internal Medicine

## 2014-07-29 ENCOUNTER — Other Ambulatory Visit (HOSPITAL_COMMUNITY): Payer: Self-pay | Admitting: Internal Medicine

## 2014-07-29 DIAGNOSIS — R52 Pain, unspecified: Secondary | ICD-10-CM

## 2014-07-29 DIAGNOSIS — M25519 Pain in unspecified shoulder: Secondary | ICD-10-CM | POA: Diagnosis present

## 2014-07-31 ENCOUNTER — Ambulatory Visit: Payer: Medicaid Other | Admitting: Obstetrics and Gynecology

## 2014-08-05 ENCOUNTER — Encounter: Payer: Self-pay | Admitting: Obstetrics and Gynecology

## 2014-08-05 ENCOUNTER — Ambulatory Visit (INDEPENDENT_AMBULATORY_CARE_PROVIDER_SITE_OTHER): Payer: Medicaid Other | Admitting: Obstetrics and Gynecology

## 2014-08-05 ENCOUNTER — Other Ambulatory Visit: Payer: Self-pay | Admitting: Obstetrics and Gynecology

## 2014-08-05 VITALS — BP 120/80 | Ht 65.0 in

## 2014-08-05 DIAGNOSIS — N949 Unspecified condition associated with female genital organs and menstrual cycle: Secondary | ICD-10-CM

## 2014-08-05 DIAGNOSIS — Z01818 Encounter for other preprocedural examination: Secondary | ICD-10-CM

## 2014-08-05 DIAGNOSIS — N938 Other specified abnormal uterine and vaginal bleeding: Secondary | ICD-10-CM

## 2014-08-05 DIAGNOSIS — Z3202 Encounter for pregnancy test, result negative: Secondary | ICD-10-CM

## 2014-08-05 DIAGNOSIS — Z3200 Encounter for pregnancy test, result unknown: Secondary | ICD-10-CM

## 2014-08-05 LAB — POCT URINE PREGNANCY: Preg Test, Ur: NEGATIVE

## 2014-08-05 NOTE — Addendum Note (Signed)
Addended by: Jonnie Kind on: 08/05/2014 11:36 AM   Modules accepted: Level of Service

## 2014-08-05 NOTE — Addendum Note (Signed)
Addended by: Farley Ly on: 08/05/2014 11:53 AM   Modules accepted: Orders

## 2014-08-05 NOTE — Progress Notes (Deleted)
Patient ID: Rachel Underwood, female   DOB: [birth year], 43 y.o.   MRN: [id number] Pt here today for an Endometrial tissue Biopsy. Pt states that Dr.Ferguson needs to numb her cervix because she is so sensitive. Pt states that she stopped bleeding on the 28th so she had a 27 day cycle and did not have any bleeding for a couple days and now she is spotting again.

## 2014-08-05 NOTE — Progress Notes (Signed)
LEVEL 4 pt due to significant difficulty due to pt anatomy.  Patient ID: Rachel Underwood, female   DOB: [birth year], 42 y.o.   MRN: [id number]  Pt here today for an Endometrial tissue Biopsy. Pt states that Dr.Shimon Trowbridge needs to numb her cervix because she is so sensitive. Pt states that she stopped bleeding on the 28th so she had a 27 day cycle and did not have any bleeding for a couple days and now she is spotting again. She denies any treatment on her cervix. She states that she had a C-Section in 1994 and she does not know how much her cervix dilated at that time. She states that she had two natural births after her C-section.  Endometrial Biopsy: Patient given informed consent, signed copy in the chart, time out was performed. Time out taken. The patient was placed in the lithotomy position and the cervix brought into view with sterile speculum.  Portio of cervix cleansed x 2 with betadine swabs.  A tenaculum was placed in the anterior lip of the cervix. Paracervical block is applied at 3, 5 7 9  oclock The uterus was sounded for depth of 7 cm,. Milex uterine Explora 3 mm was introduced to into the uterus, suction created,  and an endometrial sample was obtained. All equipment was removed and accounted for.   The patient tolerated the procedure well.   Four diff speculums used to achieve access to the cervix with the longest one used.    Patient given post procedure instructions.  Followup: 1 week  A:  1. Anteverted cervix and uterus  2. Cervix is scarred abnormally and stenotic.    P: 1. F/U in one week for results.     This chart was scribed by Steva Colder, Medical Scribe, for Dr. Mallory Shirk on 08/05/14 at 11:08 AM. This chart was reviewed by Dr. Mallory Shirk for accuracy.

## 2014-08-05 NOTE — Patient Instructions (Signed)
Endometrial Biopsy, Care After Refer to this sheet in the next few weeks. These instructions provide you with information on caring for yourself after your procedure. Your health care provider may also give you more specific instructions. Your treatment has been planned according to current medical practices, but problems sometimes occur. Call your health care provider if you have any problems or questions after your procedure. WHAT TO EXPECT AFTER THE PROCEDURE After your procedure, it is typical to have the following:  You may have mild cramping and a small amount of vaginal bleeding for a few days after the procedure. This is normal. HOME CARE INSTRUCTIONS  Only take over-the-counter or prescription medicine as directed by your health care provider.  Do not douche, use tampons, or have sexual intercourse until your health care provider approves.  Follow your health care provider's instructions regarding any activity restrictions, such as strenuous exercise or heavy lifting. SEEK MEDICAL CARE IF:  You have heavy bleeding or bleeding longer than 2 days after the procedure.  You have bad smelling drainage from your vagina.  You have a fever and chills.  Youhave severe lower stomach (abdominal) pain. SEEK IMMEDIATE MEDICAL CARE IF:  You have severe cramps in your stomach or back.  You pass large blood clots.  Your bleeding increases.  You become weak or lightheaded, or you pass out. Document Released: 09/10/2013 Document Reviewed: 09/10/2013 Exit[employer] Patient Information 2015 [employer]. This information is not intended to replace advice given to you by your health care provider. Make sure you discuss any questions you have with your health care provider.  

## 2014-08-12 ENCOUNTER — Encounter: Payer: Self-pay | Admitting: Obstetrics and Gynecology

## 2014-08-12 ENCOUNTER — Encounter: Payer: Self-pay | Admitting: Internal Medicine

## 2014-08-12 ENCOUNTER — Ambulatory Visit (INDEPENDENT_AMBULATORY_CARE_PROVIDER_SITE_OTHER): Payer: Medicaid Other | Admitting: Obstetrics and Gynecology

## 2014-08-12 VITALS — BP 112/86 | Ht 65.0 in | Wt 286.0 lb

## 2014-08-12 DIAGNOSIS — N920 Excessive and frequent menstruation with regular cycle: Secondary | ICD-10-CM

## 2014-08-12 DIAGNOSIS — N946 Dysmenorrhea, unspecified: Secondary | ICD-10-CM

## 2014-08-12 DIAGNOSIS — B20 Human immunodeficiency virus [HIV] disease: Secondary | ICD-10-CM

## 2014-08-12 DIAGNOSIS — N921 Excessive and frequent menstruation with irregular cycle: Secondary | ICD-10-CM

## 2014-08-12 DIAGNOSIS — IMO0002 Reserved for concepts with insufficient information to code with codable children: Secondary | ICD-10-CM | POA: Insufficient documentation

## 2014-08-12 NOTE — Patient Instructions (Signed)
Abdominal Hysterectomy Abdominal hysterectomy is a surgical procedure to remove your womb (uterus). Your uterus is the muscular organ that contains a developing baby. This surgery is done for many reasons. You may need an abdominal hysterectomy if you have cancer, growths (tumors), long-term pain, or bleeding. You may also have this procedure if your uterus has slipped down into your vagina (uterine prolapse). Depending on why you need an abdominal hysterectomy, you may also have other reproductive organs removed. These could include the part of your vagina that connects with your uterus (cervix), the organs that make eggs (ovaries), and the tubes that connect the ovaries to the uterus (fallopian tubes). LET [facility] CARE PROVIDER KNOW ABOUT:   Any allergies you have.  All medicines you are taking, including vitamins, herbs, eye drops, creams, and over-the-counter medicines.  Previous problems you or members of your family have had with the use of anesthetics.  Any blood disorders you have.  Previous surgeries you have had.  Medical conditions you have. RISKS AND COMPLICATIONS Generally, this is a safe procedure. However, as with any procedure, problems can occur. Infection is the most common problem after an abdominal hysterectomy. Other possible problems include:  Bleeding.  Formation of blood clots that may break free and travel to your lungs.  Injury to other organs near your uterus.  Nerve injury causing nerve pain.  Decreased interest in sex or pain during sexual intercourse. BEFORE THE PROCEDURE  Abdominal hysterectomy is a major surgical procedure. It can affect the way you feel about yourself. Talk to your health care provider about the physical and emotional changes hysterectomy may cause.  You may need to have blood work and X-rays done before surgery.  Quit smoking if you smoke. Ask your health care provider for help if you are struggling to quit.  Stop taking  medicines that thin your blood as directed by your health care provider.  You may be instructed to take antibiotic medicines or laxatives before surgery.  Do not eat or drink anything for 6-8 hours before surgery.  Take your regular medicines with a small sip of water.  Bathe or shower the night or morning before surgery. PROCEDURE  Abdominal hysterectomy is done in the operating room at the hospital.  In most cases, you will be given a medicine that makes you go to sleep (general anesthetic).  The surgeon will make a cut (incision) through the skin in your lower belly.  The incision may be about 5-7 inches long. It may go side-to-side or up-and-down.  The surgeon will move aside the body tissue that covers your uterus. The surgeon will then carefully take out your uterus along with any of your other reproductive organs that need to be removed.  Bleeding will be controlled with clamps or sutures.  The surgeon will close your incision with sutures or metal clips. AFTER THE PROCEDURE  You will have some pain immediately after the procedure.  You will be given pain medicine in the recovery room.  You will be taken to your hospital room when you have recovered from the anesthesia.  You may need to stay in the hospital for 2-5 days.  You will be given instructions for recovery at home. Document Released: 11/25/2013 Document Reviewed: 11/25/2013 [facility] Patient Information 2015 [place], [place]. This information is not intended to replace advice given to you by your health care provider. Make sure you discuss any questions you have with your health care provider.

## 2014-08-12 NOTE — Progress Notes (Signed)
. Rachel Underwood is an 43 y.o. female. Here as preop for abd hyst and bilateral salpingectomy, removal of cervix included.  Will widely excise old cicatrix due to poor healing and chronic moisture.She states the periods are not particularly heavy but states they last too long and she has associated, constant unbearable abdominal cramping.  She is chronically sensitive on exam, and not interested in endometrial ablation    Pertinent Gynecological History: Menses: flow is moderate and with severe dysmenorrhea Bleeding: intermenstrual bleeding Contraception:  DES exposure: unknown Blood transfusions: none Sexually transmitted diseases: HIV positive with a low viral load Previous GYN Procedures: endometrial biopsy. prior cesarean x 3  Last mammogram: normal Date: 12/04/13 Last pap: normal Date: 09/22/13 OB History: G10, P3073   Menstrual History: Menarche age:  Patient's last menstrual period was 07/04/2014.    Past Medical History  Diagnosis Date  . Depression   . HIV (human immunodeficiency virus infection)   . Hypertension   . Asthma   . Borderline personality disorder   . Menorrhagia with irregular cycle 06/03/2014  . Dysmenorrhea 06/03/2014  . Enlarged uterus 06/03/2014  . Herpes simplex without mention of complication   . Abnormal Pap smear of cervix 2005(EST)    [facility] for abnl pap- [facility] [place]    Past Surgical History  Procedure Laterality Date  . Ankle surgery    . Back surgery    . Shoulder surgery    . Cesarean section    . Ckc      Family History  Problem Relation Age of Onset  . Fibroids Mother   . Hypertension Mother   . Depression Mother   . Hypertension Father   . Mental illness Father   . Heart disease Maternal Grandmother   . Cancer Maternal Grandmother     breast  . Bipolar disorder Daughter   . Schizophrenia Daughter   . Cancer Paternal Grandfather     prostate  . Heart disease Paternal Grandfather   . Dementia Paternal Grandmother    . Heart disease Maternal Grandfather   . Alzheimer's disease Maternal Grandfather   . Anxiety disorder Daughter   . ADD / ADHD Son     Social History:  reports that she has been smoking Cigarettes.  She has a 30 pack-year smoking history. She has never used smokeless tobacco. She reports that she does not drink alcohol or use illicit drugs.  Allergies:  Allergies  Allergen Reactions  . Codeine Nausea And Vomiting  . Gabapentin     Hives   . Haldol [Haloperidol Lactate] Other (See Comments)    Slurs speech and "stumble"  . Invega [Paliperidone] Other (See Comments)    Slurred speech, drooling  . Lithium Hives  . Lexapro [Escitalopram Oxalate] Rash     (Not in a hospital admission)  Review of Systems  Genitourinary:       Pelvic pain  All other systems reviewed and are negative.   Blood pressure 112/86, height 5\' 5"  (1.651 m), weight 286 lb (129.729 kg), last menstrual period 07/04/2014. Physical Exam  Nursing note and vitals reviewed. Constitutional: She is oriented to person, place, and time. She appears well-developed and well-nourished.  HENT:  Head: Normocephalic and atraumatic.  Eyes: EOM are normal.  Neck: Normal range of motion. Neck supple.  Cardiovascular: Normal rate, regular rhythm and normal heart sounds.   Respiratory: Effort normal and breath sounds normal.  Genitourinary: Vagina normal. Rectal exam shows no mass. Uterus is deviated. Cervix exhibits no  motion tenderness.  Markedly anterior uterus, raising question of anterior adhesions to abd wall. Immobile uterus , tender to manipulation.  Musculoskeletal: Normal range of motion.  Neurological: She is alert and oriented to person, place, and time.  Skin: Skin is warm and dry.  Poorly Healed scar on the lower abdomen from old C-Section.   Psychiatric: She has a normal mood and affect. Her behavior is normal.     No results found for this or any previous visit (from the past 24 hour(s)).  No results  found.  Assessment/Plan: A:  1. HIV pos with negative viral load 2. Menorrhagia 3 dysmenorrhea 4. Dyspareunia 5. S/p poorly healed cesarean scar  P:  1. Abdominal hysterectomy with removal of fallopian tubes and cervix, with exicison of cicatrix..  Amy to schedule Catalina Salasar V 08/12/2014, 11:57 AM

## 2014-08-13 NOTE — Progress Notes (Signed)
TAH BSO AND SCAR REVISION ON 09-01-14 @7 :30AM.

## 2014-08-20 ENCOUNTER — Other Ambulatory Visit (HOSPITAL_COMMUNITY): Payer: Self-pay | Admitting: *Deleted

## 2014-08-20 ENCOUNTER — Ambulatory Visit (HOSPITAL_COMMUNITY)
Admission: RE | Admit: 2014-08-20 | Discharge: 2014-08-20 | Disposition: A | Discharge status: Home / Self Care | Payer: Medicaid Other | Source: Ambulatory Visit | Attending: Internal Medicine | Admitting: Internal Medicine

## 2014-08-20 DIAGNOSIS — M79609 Pain in unspecified limb: Secondary | ICD-10-CM | POA: Insufficient documentation

## 2014-08-20 DIAGNOSIS — M2550 Pain in unspecified joint: Secondary | ICD-10-CM

## 2014-08-20 DIAGNOSIS — M19079 Primary osteoarthritis, unspecified ankle and foot: Secondary | ICD-10-CM | POA: Insufficient documentation

## 2014-08-24 ENCOUNTER — Ambulatory Visit: Payer: Medicaid Other | Admitting: Cardiovascular Disease

## 2014-08-25 ENCOUNTER — Ambulatory Visit (INDEPENDENT_AMBULATORY_CARE_PROVIDER_SITE_OTHER): Payer: Medicaid Other | Admitting: *Deleted

## 2014-08-25 ENCOUNTER — Encounter (HOSPITAL_COMMUNITY): Payer: Self-pay | Admitting: Pharmacy Technician

## 2014-08-25 VITALS — BP 115/77 | HR 50 | Temp 98.3°F | Resp 16 | Wt 282.8 lb

## 2014-08-25 DIAGNOSIS — Z006 Encounter for examination for normal comparison and control in clinical research program: Secondary | ICD-10-CM

## 2014-08-25 DIAGNOSIS — B20 Human immunodeficiency virus [HIV] disease: Secondary | ICD-10-CM

## 2014-08-25 NOTE — Progress Notes (Signed)
Rachel Underwood is here for her 4 month Rep[employer] study visit. She is going to have a hysterectomy next Tuesday for heavy, long menstrual cycles. She said they told her her uterus was very enlarged. She denies any other new problems. Still has minor muscle aches, which she saw a rheumatologist at [facility] for . She said they diagnosed her with fibromyalgia, not rheumatoid arthritis.Her pill count was great, it looks like she is very adherent with study meds. She says she was given a new pain med from the rhematologist but forgot the name of it and she only takes it when she needs it. She will return in 4 months for the next study visit.

## 2014-08-26 ENCOUNTER — Encounter (HOSPITAL_COMMUNITY): Payer: Self-pay

## 2014-08-26 ENCOUNTER — Encounter (HOSPITAL_COMMUNITY)
Admission: RE | Admit: 2014-08-26 | Discharge: 2014-08-26 | Disposition: A | Discharge status: Home / Self Care | Payer: Medicaid Other | Source: Ambulatory Visit | Attending: Obstetrics and Gynecology | Admitting: Obstetrics and Gynecology

## 2014-08-26 ENCOUNTER — Other Ambulatory Visit: Payer: Self-pay | Admitting: Obstetrics and Gynecology

## 2014-08-26 DIAGNOSIS — L905 Scar conditions and fibrosis of skin: Secondary | ICD-10-CM | POA: Insufficient documentation

## 2014-08-26 DIAGNOSIS — N920 Excessive and frequent menstruation with regular cycle: Secondary | ICD-10-CM | POA: Insufficient documentation

## 2014-08-26 DIAGNOSIS — Z01812 Encounter for preprocedural laboratory examination: Secondary | ICD-10-CM | POA: Diagnosis present

## 2014-08-26 DIAGNOSIS — IMO0002 Reserved for concepts with insufficient information to code with codable children: Secondary | ICD-10-CM | POA: Insufficient documentation

## 2014-08-26 DIAGNOSIS — N946 Dysmenorrhea, unspecified: Secondary | ICD-10-CM | POA: Diagnosis present

## 2014-08-26 HISTORY — DX: Fibromyalgia: M79.7

## 2014-08-26 HISTORY — DX: Gastro-esophageal reflux disease without esophagitis: K21.9

## 2014-08-26 HISTORY — DX: Unspecified osteoarthritis, unspecified site: M19.90

## 2014-08-26 HISTORY — DX: Unspecified convulsions: R56.9

## 2014-08-26 LAB — TYPE AND SCREEN
ABO/RH(D): A NEG
Antibody Screen: NEGATIVE

## 2014-08-26 LAB — CBC
HEMATOCRIT: 38 % (ref 36.0–46.0)
Hemoglobin: 13.2 g/dL (ref 12.0–15.0)
MCH: 32.4 pg (ref 26.0–34.0)
MCHC: 34.7 g/dL (ref 30.0–36.0)
MCV: 93.4 fL (ref 78.0–100.0)
PLATELETS: 171 10*3/uL (ref 150–400)
RBC: 4.07 MIL/uL (ref 3.87–5.11)
RDW: 13.1 % (ref 11.5–15.5)
WBC: 6.6 10*3/uL (ref 4.0–10.5)

## 2014-08-26 LAB — URINALYSIS, ROUTINE W REFLEX MICROSCOPIC
Bilirubin Urine: NEGATIVE
Glucose, UA: NEGATIVE mg/dL
HGB URINE DIPSTICK: NEGATIVE
KETONES UR: NEGATIVE mg/dL
Leukocytes, UA: NEGATIVE
Nitrite: NEGATIVE
PROTEIN: NEGATIVE mg/dL
Specific Gravity, Urine: 1.02 (ref 1.005–1.030)
UROBILINOGEN UA: 0.2 mg/dL (ref 0.0–1.0)
pH: 5.5 (ref 5.0–8.0)

## 2014-08-26 LAB — RAPID URINE DRUG SCREEN, HOSP PERFORMED
Amphetamines: NOT DETECTED
Barbiturates: NOT DETECTED
Benzodiazepines: NOT DETECTED
Cocaine: NOT DETECTED
Opiates: NOT DETECTED
TETRAHYDROCANNABINOL: NOT DETECTED

## 2014-08-26 LAB — BASIC METABOLIC PANEL
ANION GAP: 10 (ref 5–15)
BUN: 14 mg/dL (ref 6–23)
CALCIUM: 9 mg/dL (ref 8.4–10.5)
CO2: 24 meq/L (ref 19–32)
CREATININE: 0.9 mg/dL (ref 0.50–1.10)
Chloride: 105 mEq/L (ref 96–112)
GFR calc Af Amer: >90 mL/min (ref >90)
GFR, EST NON AFRICAN AMERICAN: 78 mL/min — AB (ref >90)
Glucose, Bld: 96 mg/dL (ref 70–99)
Potassium: 5.2 mEq/L (ref 3.7–5.3)
SODIUM: 139 meq/L (ref 137–147)

## 2014-08-26 LAB — HCG, SERUM, QUALITATIVE: Preg, Serum: NEGATIVE

## 2014-08-26 NOTE — Patient Instructions (Signed)
Rachel Underwood  08/26/2014   Your procedure is scheduled on:   09/01/2014  Report to [facility] at  15  AM.  Call this number if you have problems the morning of surgery: [phone]   Remember:   Do not eat food or drink liquids after midnight.   Take these medicines the morning of surgery with A SIP OF WATER: omperazole, zoloft   Do not wear jewelry, make-up or nail polish.  Do not wear lotions, powders, or perfumes.   Do not shave 48 hours prior to surgery. Men may shave face and neck.  Do not bring valuables to the hospital.  [facility] is not responsible for any belongings or valuables.               Contacts, dentures or bridgework may not be worn into surgery.  Leave suitcase in the car. After surgery it may be brought to your room.  For patients admitted to the hospital, discharge time is determined by your treatment team.               Patients discharged the day of surgery will not be allowed to drive home.  Name and phone number of your driver: family  Special Instructions: Shower using CHG 2 nights before surgery and the night before surgery.  If you shower the day of surgery use CHG.  Use special wash - you have one bottle of CHG for all showers.  You should use approximately 1/3 of the bottle for each shower.   Please read over the following fact sheets that you were given: Pain Booklet, Coughing and Deep Breathing, Surgical Site Infection Prevention, Anesthesia Post-op Instructions and Care and Recovery After Surgery Bilateral Salpingo-Oophorectomy Bilateral salpingo-oophorectomy is the surgical removal of both fallopian tubes and both ovaries. The ovaries are small organs that produce eggs in women. The fallopian tubes transport the egg from the ovary to the womb (uterus). Usually, when this surgery is done, the uterus was previously removed. A bilateral salpingo-oophorectomy may be done to treat cancer or to reduce the risk of cancer in women who are at high  risk. Removing both fallopian tubes and both ovaries will make you unable to become pregnant (sterile). It will also put you into menopause so that you will no longer have menstrual periods and may have menopausal symptoms such as hot flashes, night sweats, and mood changes. It will not affect your sex drive. LET [facility] CARE PROVIDER KNOW ABOUT:  Any allergies you have.  All medicines you are taking, including vitamins, herbs, eye drops, creams, and over-the-counter medicines.  Previous problems you or members of your family have had with the use of anesthetics.  Any blood disorders you have.  Previous surgeries you have had.  Medical conditions you have. RISKS AND COMPLICATIONS Generally, this is a safe procedure. However, as with any procedure, complications can occur. Possible complications include:  Injury to surrounding organs.  Bleeding.  Infection.  Blood clots in the legs or lungs.  Problems related to anesthesia. BEFORE THE PROCEDURE  Ask your health care provider about changing or stopping your regular medicines. You may need to stop taking certain medicines, such as aspirin or blood thinners, at least 1 week before the surgery.  Do not eat or drink anything for at least 8 hours before the surgery.  If you smoke, do not smoke for at least 2 weeks before the surgery.  Make plans to have someone drive  you home after the procedure or after your hospital stay. Also arrange for someone to help you with activities during recovery. PROCEDURE   You will be given medicine to help you relax before the procedure (sedative). You will then be given medicine to make you sleep through the procedure (general anesthetic). These medicines will be given through an IV access tube that is put into one of your veins.  Once you are asleep, your lower abdomen will be shaved and cleaned. A thin, flexible tube (catheter) will be placed in your bladder.  The surgeon may use a  laparoscopic, robotic, or open technique for this surgery:  In the laparoscopic technique, the surgery is done through two small cuts (incisions) in the abdomen. A thin, lighted tube with a tiny camera on the end (laparoscope) is inserted into one of the incisions. The tools needed for the procedure are put through the other incision.  A robotic technique may be chosen to perform complex surgery in a small space. In the robotic technique, small incisions will be made. A camera and surgical instruments are passed through the incisions. Surgical instruments will be controlled with the help of a robotic arm.  In the open technique, the surgery is done through one large incision in the abdomen.  Using any of these techniques, the surgeon removes the fallopian tubes and ovaries. The blood vessels will be clamped and tied.  The surgeon then uses staples or stitches to close the incision or incisions. AFTER THE PROCEDURE  You will be taken to a recovery area where you will be monitored for 1 to 3 hours. Your blood pressure, pulse, and temperature will be checked often. You will remain in the recovery area until you are stable and waking up.  If the laparoscopic technique was used, you may be allowed to go home after several hours. You may have some shoulder pain after the laparoscopic procedure. This is normal and usually goes away in a day or two.  If the open technique was used, you will be admitted to the hospital for a couple of days.  You will be given pain medicine as needed.  The IV access tube and catheter will be removed before you are discharged. Document Released: 11/20/2005 Document Revised: 11/25/2013 Document Reviewed: 05/14/2013 [employer] Patient Information 2015 [place], [place]. This information is not intended to replace advice given to you by your health care provider. Make sure you discuss any questions you have with your health care provider. Abdominal Hysterectomy Abdominal  hysterectomy is a surgical procedure to remove your womb (uterus). Your uterus is the muscular organ that contains a developing baby. This surgery is done for many reasons. You may need an abdominal hysterectomy if you have cancer, growths (tumors), long-term pain, or bleeding. You may also have this procedure if your uterus has slipped down into your vagina (uterine prolapse). Depending on why you need an abdominal hysterectomy, you may also have other reproductive organs removed. These could include the part of your vagina that connects with your uterus (cervix), the organs that make eggs (ovaries), and the tubes that connect the ovaries to the uterus (fallopian tubes). LET [facility] CARE PROVIDER KNOW ABOUT:   Any allergies you have.  All medicines you are taking, including vitamins, herbs, eye drops, creams, and over-the-counter medicines.  Previous problems you or members of your family have had with the use of anesthetics.  Any blood disorders you have.  Previous surgeries you have had.  Medical conditions you have.  RISKS AND COMPLICATIONS Generally, this is a safe procedure. However, as with any procedure, problems can occur. Infection is the most common problem after an abdominal hysterectomy. Other possible problems include:  Bleeding.  Formation of blood clots that may break free and travel to your lungs.  Injury to other organs near your uterus.  Nerve injury causing nerve pain.  Decreased interest in sex or pain during sexual intercourse. BEFORE THE PROCEDURE  Abdominal hysterectomy is a major surgical procedure. It can affect the way you feel about yourself. Talk to your health care provider about the physical and emotional changes hysterectomy may cause.  You may need to have blood work and X-rays done before surgery.  Quit smoking if you smoke. Ask your health care provider for help if you are struggling to quit.  Stop taking medicines that thin your blood as  directed by your health care provider.  You may be instructed to take antibiotic medicines or laxatives before surgery.  Do not eat or drink anything for 6-8 hours before surgery.  Take your regular medicines with a small sip of water.  Bathe or shower the night or morning before surgery. PROCEDURE  Abdominal hysterectomy is done in the operating room at the hospital.  In most cases, you will be given a medicine that makes you go to sleep (general anesthetic).  The surgeon will make a cut (incision) through the skin in your lower belly.  The incision may be about 5-7 inches long. It may go side-to-side or up-and-down.  The surgeon will move aside the body tissue that covers your uterus. The surgeon will then carefully take out your uterus along with any of your other reproductive organs that need to be removed.  Bleeding will be controlled with clamps or sutures.  The surgeon will close your incision with sutures or metal clips. AFTER THE PROCEDURE  You will have some pain immediately after the procedure.  You will be given pain medicine in the recovery room.  You will be taken to your hospital room when you have recovered from the anesthesia.  You may need to stay in the hospital for 2-5 days.  You will be given instructions for recovery at home. Document Released: 11/25/2013 Document Reviewed: 11/25/2013 [facility] Patient Information 2015 [place], [place]. This information is not intended to replace advice given to you by your health care provider. Make sure you discuss any questions you have with your health care provider. PATIENT INSTRUCTIONS POST-ANESTHESIA  IMMEDIATELY FOLLOWING SURGERY:  Do not drive or operate machinery for the first twenty four hours after surgery.  Do not make any important decisions for twenty four hours after surgery or while taking narcotic pain medications or sedatives.  If you develop intractable nausea and vomiting or a severe headache please  notify your doctor immediately.  FOLLOW-UP:  Please make an appointment with your surgeon as instructed. You do not need to follow up with anesthesia unless specifically instructed to do so.  WOUND CARE INSTRUCTIONS (if applicable):  Keep a dry clean dressing on the anesthesia/puncture wound site if there is drainage.  Once the wound has quit draining you may leave it open to air.  Generally you should leave the bandage intact for twenty four hours unless there is drainage.  If the epidural site drains for more than 36-48 hours please call the anesthesia department.  QUESTIONS?:  Please feel free to call your physician or the hospital operator if you have any questions, and they will be happy to  assist you.

## 2014-08-31 ENCOUNTER — Telehealth: Payer: Self-pay | Admitting: Obstetrics and Gynecology

## 2014-08-31 NOTE — H&P (Signed)
Jonnie Kind, MD Signed Jonnie Kind, MD 08/17/2014 6:15 PM         Progress Notes    .  Rachel Underwood is an 43 y.o. female. Here as preop for abd hyst and bilateral salpingectomy, removal of cervix included. Will widely excise old cicatrix due to poor healing and chronic moisture.She states the periods are not particularly heavy but states they last too long and she has associated, constant unbearable abdominal cramping.  She is chronically sensitive on exam, and not interested in endometrial ablation  Pertinent Gynecological History:  Menses: flow is moderate and with severe dysmenorrhea  Bleeding: intermenstrual bleeding  Contraception:  DES exposure: unknown  Blood transfusions: none  Sexually transmitted diseases: HIV positive with a low viral load  Previous GYN Procedures: endometrial biopsy. prior cesarean x 3  Last mammogram: normal Date: 12/04/13  Last pap: normal Date: 09/22/13  OB History: G10, P3073  Menstrual History:  Menarche age:  Patient's last menstrual period was 07/04/2014.     Past Medical History     Diagnosis  Date     .  Depression      .  HIV (human immunodeficiency virus infection)      .  Hypertension      .  Asthma      .  Borderline personality disorder      .  Menorrhagia with irregular cycle  06/03/2014     .  Dysmenorrhea  06/03/2014     .  Enlarged uterus  06/03/2014     .  Herpes simplex without mention of complication      .  Abnormal Pap smear of cervix  2005(EST)       ckc for abnl pap- [facility] [place]     Past Surgical History     Procedure  Laterality  Date     .  Ankle surgery       .  Back surgery       .  Shoulder surgery       .  Cesarean section       .  Ckc       Family History     Problem  Relation  Age of Onset     .  Fibroids  Mother      .  Hypertension  Mother      .  Depression  Mother      .  Hypertension  Father      .  Mental illness  Father      .  Heart disease  Maternal Grandmother      .   Cancer  Maternal Grandmother        breast     .  Bipolar disorder  Daughter      .  Schizophrenia  Daughter      .  Cancer  Paternal Grandfather        prostate     .  Heart disease  Paternal Grandfather      .  Dementia  Paternal Grandmother      .  Heart disease  Maternal Grandfather      .  Alzheimer's disease  Maternal Grandfather      .  Anxiety disorder  Daughter      .  ADD / ADHD  Son      Social History: reports that she has been smoking Cigarettes. She has a 30 pack-year smoking history. She has never  used smokeless tobacco. She reports that she does not drink alcohol or use illicit drugs.  Allergies:     Allergies     Allergen  Reactions     .  Codeine  Nausea And Vomiting     .  Gabapentin        Hives     .  Haldol [Haloperidol Lactate]  Other (See Comments)       Slurs speech and "stumble"     .  Invega [Paliperidone]  Other (See Comments)       Slurred speech, drooling     .  Lithium  Hives     .  Lexapro [Escitalopram Oxalate]  Rash     (Not in a hospital admission)  Review of Systems  Genitourinary:  Pelvic pain  All other systems reviewed and are negative.  Blood pressure 112/86, height 5\' 5"  (1.651 m), weight 286 lb (129.729 kg), last menstrual period 07/04/2014.  Physical Exam  Nursing note and vitals reviewed.  Constitutional: She is oriented to person, place, and time. She appears well-developed and well-nourished.  HENT:  Head: Normocephalic and atraumatic.  Eyes: EOM are normal.  Neck: Normal range of motion. Neck supple.  Cardiovascular: Normal rate, regular rhythm and normal heart sounds.  Respiratory: Effort normal and breath sounds normal.  Genitourinary: Vagina normal. Rectal exam shows no mass. Uterus is deviated. Cervix exhibits no motion tenderness.  Markedly anterior uterus, raising question of anterior adhesions to abd wall. Immobile uterus , tender to manipulation.  Musculoskeletal: Normal range of motion.  Neurological: She is alert and  oriented to person, place, and time.  Skin: Skin is warm and dry.  Poorly Healed scar on the lower abdomen from old C-Section.  Psychiatric: She has a normal mood and affect. Her behavior is normal.  No results found for this or any previous visit (from the past 24 hour(s)).  No results found.  Assessment/Plan:  A:  1. HIV pos with negative viral load  2. Menorrhagia  3 dysmenorrhea  4. Dyspareunia  5. S/p poorly healed cesarean scar  P:  1. Abdominal hysterectomy with removal of fallopian tubes and cervix, with exicison of cicatrix..  Amy to schedule  Rachel Underwood  08/12/2014, 11:57 AM

## 2014-09-01 ENCOUNTER — Encounter (HOSPITAL_COMMUNITY): Payer: Self-pay | Admitting: *Deleted

## 2014-09-01 ENCOUNTER — Encounter (HOSPITAL_COMMUNITY)
Admission: RE | Disposition: A | Discharge status: Home / Self Care | Payer: Self-pay | Source: Ambulatory Visit | Attending: Obstetrics and Gynecology

## 2014-09-01 ENCOUNTER — Inpatient Hospital Stay (HOSPITAL_COMMUNITY): Payer: Medicaid Other | Admitting: Anesthesiology

## 2014-09-01 ENCOUNTER — Inpatient Hospital Stay (HOSPITAL_COMMUNITY)
Admission: RE | Admit: 2014-09-01 | Discharge: 2014-09-03 | DRG: 742 | Disposition: A | Discharge status: Home / Self Care | Payer: Medicaid Other | Source: Ambulatory Visit | Attending: Obstetrics and Gynecology | Admitting: Obstetrics and Gynecology

## 2014-09-01 ENCOUNTER — Encounter (HOSPITAL_COMMUNITY): Payer: Medicaid Other | Admitting: Anesthesiology

## 2014-09-01 DIAGNOSIS — Z818 Family history of other mental and behavioral disorders: Secondary | ICD-10-CM

## 2014-09-01 DIAGNOSIS — J45909 Unspecified asthma, uncomplicated: Secondary | ICD-10-CM | POA: Diagnosis present

## 2014-09-01 DIAGNOSIS — N83209 Unspecified ovarian cyst, unspecified side: Secondary | ICD-10-CM

## 2014-09-01 DIAGNOSIS — Z21 Asymptomatic human immunodeficiency virus [HIV] infection status: Secondary | ICD-10-CM | POA: Diagnosis present

## 2014-09-01 DIAGNOSIS — Z820 Family history of epilepsy and other diseases of the nervous system: Secondary | ICD-10-CM

## 2014-09-01 DIAGNOSIS — Z8249 Family history of ischemic heart disease and other diseases of the circulatory system: Secondary | ICD-10-CM | POA: Diagnosis not present

## 2014-09-01 DIAGNOSIS — F603 Borderline personality disorder: Secondary | ICD-10-CM | POA: Diagnosis present

## 2014-09-01 DIAGNOSIS — Z23 Encounter for immunization: Secondary | ICD-10-CM | POA: Diagnosis not present

## 2014-09-01 DIAGNOSIS — N946 Dysmenorrhea, unspecified: Secondary | ICD-10-CM | POA: Diagnosis not present

## 2014-09-01 DIAGNOSIS — I10 Essential (primary) hypertension: Secondary | ICD-10-CM | POA: Diagnosis present

## 2014-09-01 DIAGNOSIS — Z6841 Body Mass Index (BMI) 40.0 and over, adult: Secondary | ICD-10-CM | POA: Diagnosis not present

## 2014-09-01 DIAGNOSIS — N941 Dyspareunia: Principal | ICD-10-CM | POA: Diagnosis present

## 2014-09-01 DIAGNOSIS — F17210 Nicotine dependence, cigarettes, uncomplicated: Secondary | ICD-10-CM | POA: Diagnosis present

## 2014-09-01 DIAGNOSIS — N920 Excessive and frequent menstruation with regular cycle: Secondary | ICD-10-CM | POA: Diagnosis present

## 2014-09-01 DIAGNOSIS — IMO0002 Reserved for concepts with insufficient information to code with codable children: Secondary | ICD-10-CM | POA: Diagnosis not present

## 2014-09-01 DIAGNOSIS — N921 Excessive and frequent menstruation with irregular cycle: Secondary | ICD-10-CM

## 2014-09-01 DIAGNOSIS — Z803 Family history of malignant neoplasm of breast: Secondary | ICD-10-CM | POA: Diagnosis not present

## 2014-09-01 DIAGNOSIS — Z90710 Acquired absence of both cervix and uterus: Secondary | ICD-10-CM | POA: Diagnosis present

## 2014-09-01 DIAGNOSIS — D235 Other benign neoplasm of skin of trunk: Secondary | ICD-10-CM | POA: Diagnosis not present

## 2014-09-01 DIAGNOSIS — F17200 Nicotine dependence, unspecified, uncomplicated: Secondary | ICD-10-CM | POA: Diagnosis not present

## 2014-09-01 HISTORY — PX: SCAR REVISION: SHX5285

## 2014-09-01 HISTORY — PX: EXCISION OF SKIN TAG: SHX6270

## 2014-09-01 HISTORY — PX: BILATERAL SALPINGECTOMY: SHX5743

## 2014-09-01 HISTORY — PX: ABDOMINAL HYSTERECTOMY: SHX81

## 2014-09-01 SURGERY — HYSTERECTOMY, ABDOMINAL
Anesthesia: General | Site: Abdomen

## 2014-09-01 MED ORDER — KETOROLAC TROMETHAMINE 30 MG/ML IJ SOLN
30.0000 mg | Freq: Four times a day (QID) | INTRAMUSCULAR | Status: DC
  Administered 2014-09-01 – 2014-09-03 (×7): 30 mg via INTRAVENOUS
  Filled 2014-09-01 (×7): qty 1

## 2014-09-01 MED ORDER — FENTANYL CITRATE 0.05 MG/ML IJ SOLN: 25.0000 ug | INTRAMUSCULAR | Status: DC | PRN

## 2014-09-01 MED ORDER — HYDROMORPHONE 0.3 MG/ML IV SOLN
INTRAVENOUS | Status: DC
  Administered 2014-09-01: 11:00:00 via INTRAVENOUS

## 2014-09-01 MED ORDER — ACYCLOVIR 200 MG PO CAPS: 400.0000 mg | ORAL_CAPSULE | Freq: Two times a day (BID) | ORAL | Status: DC | PRN

## 2014-09-01 MED ORDER — PANTOPRAZOLE SODIUM 40 MG PO TBEC
40.0000 mg | DELAYED_RELEASE_TABLET | Freq: Every day | ORAL | Status: DC
  Administered 2014-09-02 – 2014-09-03 (×2): 40 mg via ORAL
  Filled 2014-09-01 (×2): qty 1

## 2014-09-01 MED ORDER — OXYCODONE-ACETAMINOPHEN 5-325 MG PO TABS
1.0000 | ORAL_TABLET | ORAL | Status: DC | PRN
  Administered 2014-09-02: 1 via ORAL
  Administered 2014-09-02: 2 via ORAL
  Filled 2014-09-01: qty 2
  Filled 2014-09-01: qty 1

## 2014-09-01 MED ORDER — ELVITEG-COBIC-EMTRICIT-TENOFDF 150-150-200-300 MG PO TABS
1.0000 | ORAL_TABLET | Freq: Every day | ORAL | Status: DC
  Filled 2014-09-01 (×3): qty 1

## 2014-09-01 MED ORDER — MIDAZOLAM HCL 2 MG/2ML IJ SOLN
INTRAMUSCULAR | Status: AC
  Filled 2014-09-01: qty 2

## 2014-09-01 MED ORDER — CEFAZOLIN SODIUM 1-5 GM-% IV SOLN
INTRAVENOUS | Status: AC
  Filled 2014-09-01: qty 50

## 2014-09-01 MED ORDER — ROCURONIUM BROMIDE 50 MG/5ML IV SOLN
INTRAVENOUS | Status: AC
  Filled 2014-09-01: qty 1

## 2014-09-01 MED ORDER — GLYCOPYRROLATE 0.2 MG/ML IJ SOLN
INTRAMUSCULAR | Status: DC | PRN
  Administered 2014-09-01: 0.4 mg via INTRAVENOUS

## 2014-09-01 MED ORDER — DEXTROSE 5 % IV SOLN
INTRAVENOUS | Status: DC | PRN
  Administered 2014-09-01: 08:00:00 via INTRAVENOUS

## 2014-09-01 MED ORDER — DOCUSATE SODIUM 100 MG PO CAPS
100.0000 mg | ORAL_CAPSULE | Freq: Two times a day (BID) | ORAL | Status: DC
  Administered 2014-09-02 – 2014-09-03 (×3): 100 mg via ORAL
  Filled 2014-09-01 (×3): qty 1

## 2014-09-01 MED ORDER — FENTANYL CITRATE 0.05 MG/ML IJ SOLN
INTRAMUSCULAR | Status: AC
  Filled 2014-09-01: qty 5

## 2014-09-01 MED ORDER — DEXTROSE 5 % IV SOLN
3.0000 g | INTRAVENOUS | Status: AC
  Administered 2014-09-01: 3 g via INTRAVENOUS
  Filled 2014-09-01: qty 3000

## 2014-09-01 MED ORDER — SERTRALINE HCL 50 MG PO TABS
200.0000 mg | ORAL_TABLET | Freq: Every morning | ORAL | Status: DC
  Administered 2014-09-02 – 2014-09-03 (×2): 200 mg via ORAL
  Filled 2014-09-01 (×2): qty 4

## 2014-09-01 MED ORDER — PROPOFOL 10 MG/ML IV BOLUS
INTRAVENOUS | Status: DC | PRN
  Administered 2014-09-01: 170 mg via INTRAVENOUS

## 2014-09-01 MED ORDER — KETOROLAC TROMETHAMINE 30 MG/ML IJ SOLN: 30.0000 mg | Freq: Four times a day (QID) | INTRAMUSCULAR | Status: DC

## 2014-09-01 MED ORDER — SODIUM CHLORIDE 0.9 % IJ SOLN: 9.0000 mL | INTRAMUSCULAR | Status: DC | PRN

## 2014-09-01 MED ORDER — LIDOCAINE HCL 1 % IJ SOLN
INTRAMUSCULAR | Status: DC | PRN
  Administered 2014-09-01: 50 mg via INTRADERMAL

## 2014-09-01 MED ORDER — 0.9 % SODIUM CHLORIDE (POUR BTL) OPTIME
TOPICAL | Status: DC | PRN
  Administered 2014-09-01 (×2): 1000 mL

## 2014-09-01 MED ORDER — SODIUM CHLORIDE 0.9 % IV SOLN
INTRAVENOUS | Status: DC | PRN
  Administered 2014-09-01: 09:00:00

## 2014-09-01 MED ORDER — ONDANSETRON HCL 4 MG/2ML IJ SOLN: 4.0000 mg | Freq: Once | INTRAMUSCULAR | Status: DC | PRN

## 2014-09-01 MED ORDER — NALOXONE HCL 0.4 MG/ML IJ SOLN: 0.4000 mg | INTRAMUSCULAR | Status: DC | PRN

## 2014-09-01 MED ORDER — INFLUENZA VAC SPLIT QUAD 0.5 ML IM SUSY
0.5000 mL | PREFILLED_SYRINGE | INTRAMUSCULAR | Status: AC
  Administered 2014-09-02: 0.5 mL via INTRAMUSCULAR
  Filled 2014-09-01: qty 0.5

## 2014-09-01 MED ORDER — PROPOFOL 10 MG/ML IV EMUL
INTRAVENOUS | Status: AC
  Filled 2014-09-01: qty 20

## 2014-09-01 MED ORDER — BUPIVACAINE LIPOSOME 1.3 % IJ SUSP
INTRAMUSCULAR | Status: AC
  Filled 2014-09-01: qty 20

## 2014-09-01 MED ORDER — OXCARBAZEPINE 300 MG PO TABS
150.0000 mg | ORAL_TABLET | Freq: Every day | ORAL | Status: DC
  Administered 2014-09-01 – 2014-09-02 (×2): 150 mg via ORAL
  Filled 2014-09-01 (×3): qty 0.5

## 2014-09-01 MED ORDER — ONDANSETRON HCL 4 MG/2ML IJ SOLN
INTRAMUSCULAR | Status: AC
  Filled 2014-09-01: qty 2

## 2014-09-01 MED ORDER — SODIUM CHLORIDE 0.9 % IJ SOLN
INTRAMUSCULAR | Status: AC
  Filled 2014-09-01: qty 20

## 2014-09-01 MED ORDER — ONDANSETRON HCL 4 MG PO TABS: 4.0000 mg | ORAL_TABLET | Freq: Four times a day (QID) | ORAL | Status: DC | PRN

## 2014-09-01 MED ORDER — CEFAZOLIN SODIUM-DEXTROSE 2-3 GM-% IV SOLR
INTRAVENOUS | Status: AC
  Filled 2014-09-01: qty 50

## 2014-09-01 MED ORDER — SUCCINYLCHOLINE CHLORIDE 20 MG/ML IJ SOLN
INTRAMUSCULAR | Status: DC | PRN
  Administered 2014-09-01: 180 mg via INTRAVENOUS

## 2014-09-01 MED ORDER — HYDROMORPHONE 0.3 MG/ML IV SOLN
INTRAVENOUS | Status: AC
  Filled 2014-09-01: qty 25

## 2014-09-01 MED ORDER — MIDAZOLAM HCL 5 MG/5ML IJ SOLN
INTRAMUSCULAR | Status: DC | PRN
  Administered 2014-09-01: 2 mg via INTRAVENOUS

## 2014-09-01 MED ORDER — DIPHENHYDRAMINE HCL 50 MG/ML IJ SOLN: 12.5000 mg | Freq: Four times a day (QID) | INTRAMUSCULAR | Status: DC | PRN

## 2014-09-01 MED ORDER — GLYCOPYRROLATE 0.2 MG/ML IJ SOLN
INTRAMUSCULAR | Status: AC
  Filled 2014-09-01: qty 2

## 2014-09-01 MED ORDER — ALBUTEROL SULFATE (2.5 MG/3ML) 0.083% IN NEBU: 2.5000 mg | INHALATION_SOLUTION | RESPIRATORY_TRACT | Status: DC | PRN

## 2014-09-01 MED ORDER — DIPHENHYDRAMINE HCL 12.5 MG/5ML PO ELIX: 12.5000 mg | ORAL_SOLUTION | Freq: Four times a day (QID) | ORAL | Status: DC | PRN

## 2014-09-01 MED ORDER — ONDANSETRON HCL 4 MG/2ML IJ SOLN
4.0000 mg | Freq: Four times a day (QID) | INTRAMUSCULAR | Status: DC | PRN
  Administered 2014-09-02 (×2): 4 mg via INTRAVENOUS
  Filled 2014-09-01 (×5): qty 2

## 2014-09-01 MED ORDER — SUCCINYLCHOLINE CHLORIDE 20 MG/ML IJ SOLN
INTRAMUSCULAR | Status: AC
  Filled 2014-09-01: qty 1

## 2014-09-01 MED ORDER — SODIUM CHLORIDE 0.9 % IV SOLN
INTRAVENOUS | Status: DC
  Administered 2014-09-01 – 2014-09-02 (×2): via INTRAVENOUS

## 2014-09-01 MED ORDER — ROCURONIUM BROMIDE 100 MG/10ML IV SOLN
INTRAVENOUS | Status: DC | PRN
  Administered 2014-09-01: 50 mg via INTRAVENOUS
  Administered 2014-09-01: 10 mg via INTRAVENOUS

## 2014-09-01 MED ORDER — KETOROLAC TROMETHAMINE 30 MG/ML IJ SOLN
30.0000 mg | Freq: Once | INTRAMUSCULAR | Status: AC
  Administered 2014-09-01: 30 mg via INTRAVENOUS
  Filled 2014-09-01: qty 1

## 2014-09-01 MED ORDER — IBUPROFEN 600 MG PO TABS: 600.0000 mg | ORAL_TABLET | Freq: Four times a day (QID) | ORAL | Status: DC | PRN

## 2014-09-01 MED ORDER — FENTANYL CITRATE 0.05 MG/ML IJ SOLN
INTRAMUSCULAR | Status: DC | PRN
  Administered 2014-09-01 (×2): 50 ug via INTRAVENOUS
  Administered 2014-09-01 (×2): 100 ug via INTRAVENOUS
  Administered 2014-09-01 (×4): 50 ug via INTRAVENOUS

## 2014-09-01 MED ORDER — ONDANSETRON HCL 4 MG/2ML IJ SOLN
4.0000 mg | Freq: Once | INTRAMUSCULAR | Status: AC
  Administered 2014-09-01: 4 mg via INTRAVENOUS

## 2014-09-01 MED ORDER — ATORVASTATIN CALCIUM 10 MG PO TABS
10.0000 mg | ORAL_TABLET | Freq: Every day | ORAL | Status: DC
  Administered 2014-09-01 – 2014-09-02 (×2): 10 mg via ORAL
  Filled 2014-09-01 (×2): qty 1

## 2014-09-01 MED ORDER — LACTATED RINGERS IV SOLN
INTRAVENOUS | Status: DC
  Administered 2014-09-01: 09:00:00 via INTRAVENOUS
  Administered 2014-09-01: 1000 mL via INTRAVENOUS
  Administered 2014-09-01: 10:00:00 via INTRAVENOUS

## 2014-09-01 MED ORDER — ONDANSETRON HCL 4 MG/2ML IJ SOLN
4.0000 mg | Freq: Four times a day (QID) | INTRAMUSCULAR | Status: DC | PRN
  Administered 2014-09-01: 4 mg via INTRAVENOUS

## 2014-09-01 MED ORDER — LIDOCAINE HCL (PF) 1 % IJ SOLN
INTRAMUSCULAR | Status: AC
  Filled 2014-09-01: qty 5

## 2014-09-01 MED ORDER — NEOSTIGMINE METHYLSULFATE 10 MG/10ML IV SOLN
INTRAVENOUS | Status: DC | PRN
  Administered 2014-09-01: 2 mg via INTRAVENOUS

## 2014-09-01 MED ORDER — ATENOLOL 25 MG PO TABS
100.0000 mg | ORAL_TABLET | Freq: Every day | ORAL | Status: DC
  Administered 2014-09-02 – 2014-09-03 (×2): 100 mg via ORAL
  Filled 2014-09-01 (×4): qty 4

## 2014-09-01 MED ORDER — MIDAZOLAM HCL 2 MG/2ML IJ SOLN
1.0000 mg | INTRAMUSCULAR | Status: DC | PRN
  Administered 2014-09-01: 2 mg via INTRAVENOUS
  Filled 2014-09-01: qty 2

## 2014-09-01 SURGICAL SUPPLY — 64 items
APPLIER CLIP 11 MED OPEN (CLIP)
APPLIER CLIP 13 LRG OPEN (CLIP)
BAG HAMPER (MISCELLANEOUS) ×5 IMPLANT
BENZOIN TINCTURE PRP APPL 2/3 (GAUZE/BANDAGES/DRESSINGS) ×5 IMPLANT
BLADE 10 SAFETY STRL DISP (BLADE) IMPLANT
CELLS DAT CNTRL 66122 CELL SVR (MISCELLANEOUS) IMPLANT
CLIP APPLIE 11 MED OPEN (CLIP) IMPLANT
CLIP APPLIE 13 LRG OPEN (CLIP) IMPLANT
CLOSURE WOUND 1/2 X4 (GAUZE/BANDAGES/DRESSINGS) ×2
CLOTH BEACON ORANGE TIMEOUT ST (SAFETY) IMPLANT
COVER LIGHT HANDLE STERIS (MISCELLANEOUS) ×10 IMPLANT
DRAPE WARM FLUID 44X44 (DRAPE) IMPLANT
DURAPREP 26ML APPLICATOR (WOUND CARE) ×5 IMPLANT
ELECT REM PT RETURN 9FT ADLT (ELECTROSURGICAL) ×5
ELECTRODE REM PT RTRN 9FT ADLT (ELECTROSURGICAL) ×3 IMPLANT
EVACUATOR DRAINAGE 10X20 100CC (DRAIN) ×3 IMPLANT
EVACUATOR SILICONE 100CC (DRAIN) ×2
FORMALIN 10 PREFIL 480ML (MISCELLANEOUS) ×5 IMPLANT
GAUZE PACKING 2X5 YD STRL (GAUZE/BANDAGES/DRESSINGS) IMPLANT
GAUZE SPONGE 4X4 12PLY STRL (GAUZE/BANDAGES/DRESSINGS) ×5 IMPLANT
GLOVE BIOGEL M 6.5 STRL (GLOVE) ×5 IMPLANT
GLOVE BIOGEL PI IND STRL 7.0 (GLOVE) ×6 IMPLANT
GLOVE BIOGEL PI IND STRL 9 (GLOVE) ×3 IMPLANT
GLOVE BIOGEL PI INDICATOR 7.0 (GLOVE) ×4
GLOVE BIOGEL PI INDICATOR 9 (GLOVE) ×2
GLOVE ECLIPSE 6.5 STRL STRAW (GLOVE) ×5 IMPLANT
GLOVE ECLIPSE 9.0 STRL (GLOVE) ×5 IMPLANT
GOWN SPEC L3 XXLG W/TWL (GOWN DISPOSABLE) ×5 IMPLANT
GOWN STRL REUS W/TWL LRG LVL3 (GOWN DISPOSABLE) ×10 IMPLANT
INST SET MAJOR GENERAL (KITS) ×5 IMPLANT
KIT ROOM TURNOVER APOR (KITS) ×5 IMPLANT
MANIFOLD NEPTUNE II (INSTRUMENTS) ×5 IMPLANT
NEEDLE HYPO 18GX1.5 BLUNT FILL (NEEDLE) ×5 IMPLANT
NEEDLE HYPO 25X1 1.5 SAFETY (NEEDLE) ×5 IMPLANT
NS IRRIG 1000ML POUR BTL (IV SOLUTION) ×10 IMPLANT
PACK ABDOMINAL MAJOR (CUSTOM PROCEDURE TRAY) ×5 IMPLANT
PAD ABD 5X9 TENDERSORB (GAUZE/BANDAGES/DRESSINGS) ×5 IMPLANT
PAD ARMBOARD 7.5X6 YLW CONV (MISCELLANEOUS) ×5 IMPLANT
RETRACTOR WND ALEXIS 25 LRG (MISCELLANEOUS) IMPLANT
RTRCTR WOUND ALEXIS 18CM MED (MISCELLANEOUS)
RTRCTR WOUND ALEXIS 25CM LRG (MISCELLANEOUS)
SET BASIN LINEN APH (SET/KITS/TRAYS/PACK) ×5 IMPLANT
SOL PREP PROV IODINE SCRUB 4OZ (MISCELLANEOUS) ×5 IMPLANT
SPONGE DRAIN TRACH 4X4 STRL 2S (GAUZE/BANDAGES/DRESSINGS) ×5 IMPLANT
SPONGE GAUZE 4X4 12PLY (GAUZE/BANDAGES/DRESSINGS) ×4 IMPLANT
SPONGE LAP 18X18 X RAY DECT (DISPOSABLE) IMPLANT
STAPLER VISISTAT 35W (STAPLE) ×5 IMPLANT
STRIP CLOSURE SKIN 1/2X4 (GAUZE/BANDAGES/DRESSINGS) ×8 IMPLANT
SUT CHROMIC 0 CT 1 (SUTURE) ×60 IMPLANT
SUT CHROMIC 2 0 CT 1 (SUTURE) ×5 IMPLANT
SUT ETHILON 3 0 FSL (SUTURE) ×5 IMPLANT
SUT PDS AB CT VIOLET #0 27IN (SUTURE) IMPLANT
SUT PLAIN CT 1/2CIR 2-0 27IN (SUTURE) ×10 IMPLANT
SUT VIC AB 0 CT1 27 (SUTURE) ×4
SUT VIC AB 0 CT1 27XBRD ANTBC (SUTURE) ×6 IMPLANT
SUT VIC AB 0 CTX 36 (SUTURE) ×2
SUT VIC AB 0 CTX36XBRD ANTBCTR (SUTURE) ×3 IMPLANT
SUT VIC AB 2-0 CT1 27 (SUTURE)
SUT VIC AB 2-0 CT1 TAPERPNT 27 (SUTURE) IMPLANT
SUT VICRYL 4 0 KS 27 (SUTURE) ×5 IMPLANT
SYR 20CC LL (SYRINGE) ×5 IMPLANT
TAPE CLOTH SURG 4X10 WHT LF (GAUZE/BANDAGES/DRESSINGS) ×5 IMPLANT
TOWEL OR 17X26 4PK STRL BLUE (TOWEL DISPOSABLE) ×5 IMPLANT
TRAY FOLEY CATH 16FR SILVER (SET/KITS/TRAYS/PACK) ×5 IMPLANT

## 2014-09-01 NOTE — Telephone Encounter (Signed)
Pt called to confirm plans for am surgery.

## 2014-09-01 NOTE — Progress Notes (Signed)
Pt tried to get out of bed w/o assistance. Bed alarm alerted staff. Helped pt to bathroom, reminded pt  to use call bell when needing to get up. Pt. Apologized stated she would call next time. Will continue to monitor. Bed alarm on.

## 2014-09-01 NOTE — Progress Notes (Signed)
Received report Rachel Underwood, ready to receive pt to floor.

## 2014-09-01 NOTE — Brief Op Note (Addendum)
09/01/2014  10:06 AM  PATIENT:  Rachel Underwood  43 y.o. female  PRE-OPERATIVE DIAGNOSIS:  MENORRHAGIA DYSMENORRHEA DYSPAREUNIA SCAR REVISION HIV positive  POST-OPERATIVE DIAGNOSIS:  MENORRHAGIA DYSMENORRHEA DYSPAREUNIA SCAR REVISION HIV positive  PROCEDURE:  Procedure(s): HYSTERECTOMY ABDOMINAL (N/A) EXCISION OF CICATRIX (N/A) BILATERAL SALPINGECTOMY (Bilateral) EXCISION OF SKIN NEVUS (N/A) placement of sub q jp drain  SURGEON:  Surgeon(s) and Role:    * Jonnie Kind, MD - Primary           ASSISTANTS: [place] RN      ANESTHESIA:   general  EBL:  Total I/O In: 2100 [I.V.:2100] Out: 525 [Urine:350; Blood:175]  BLOOD ADMINISTERED:none  DRAINS: (1) Jackson-Pratt drain(s) with closed bulb suction in the sub Q space and Urinary Catheter (Foley)   LOCAL MEDICATIONS USED:  OTHER exparel x 20 cc, diluted   SPECIMEN:  Source of Specimen:  uterus , cervix, fallopian tubes, skin nevus  DISPOSITION OF SPECIMEN:  PATHOLOGY  COUNTS:  YES  TOURNIQUET:  * No tourniquets in log *  DICTATION: .Dragon Dictation  PLAN OF CARE: Admit to inpatient   PATIENT DISPOSITION:  PACU - hemodynamically stable.   Delay start of Pharmacological VTE agent (>24hrs) due to surgical blood loss or risk of bleeding: not applicable

## 2014-09-01 NOTE — Interval H&P Note (Signed)
History and Physical Interval Note:  09/01/2014 7:30 AM  Rachel Underwood  has presented today for surgery, with the diagnosis of [place]  The various methods of treatment have been discussed with the patient and family. After consideration of risks, benefits and other options for treatment, the patient has consented to  Procedure(s): HYSTERECTOMY ABDOMINAL (N/A) SALPINGO OOPHORECTOMY (Bilateral) EXCISION OF CICATRIX (N/A) as a surgical intervention .  The patient's history has been reviewed, patient examined, no change in status, stable for surgery.  I have reviewed the patient's chart and labs.  Questions were answered to the patient's satisfaction.   The patient has confirmed that she still desires preservation of the ovaries, as confirmed in the original history. It is our intent to preserve the ovaries, unless they are involved in the scar tissue. It is our plan to preserve an ovary or ovaries if possible, however removal is needed, pt has consented to this.   Jonnie Kind

## 2014-09-01 NOTE — Anesthesia Preprocedure Evaluation (Signed)
Anesthesia Evaluation  Patient identified by MRN, date of birth, ID band Patient awake    Reviewed: Allergy & Precautions, H&P , NPO status , Patient's Chart, lab work & pertinent test results  Airway Mallampati: II TM Distance: >3 FB Neck ROM: Full    Dental  (+) Teeth Intact   Pulmonary asthma , Current Smoker,  breath sounds clear to auscultation        Cardiovascular hypertension, Pt. on medications Rhythm:Regular Rate:Normal     Neuro/Psych Seizures -, Well Controlled,  PSYCHIATRIC DISORDERS Depression Bipolar Disorder Schizophrenia  Neuromuscular disease    GI/Hepatic GERD-  Medicated and Controlled,  Endo/Other  Morbid obesity  Renal/GU      Musculoskeletal  (+) Arthritis -, Fibromyalgia -  Abdominal   Peds  Hematology   Anesthesia Other Findings   Reproductive/Obstetrics                           Anesthesia Physical Anesthesia Plan  ASA: III  Anesthesia Plan: General   Post-op Pain Management:    Induction: Intravenous, Rapid sequence and Cricoid pressure planned  Airway Management Planned: Oral ETT  Additional Equipment:   Intra-op Plan:   Post-operative Plan: Extubation in OR  Informed Consent: I have reviewed the patients History and Physical, chart, labs and discussed the procedure including the risks, benefits and alternatives for the proposed anesthesia with the patient or authorized representative who has indicated his/her understanding and acceptance.     Plan Discussed with:   Anesthesia Plan Comments:         Anesthesia Quick Evaluation

## 2014-09-01 NOTE — Transfer of Care (Signed)
Immediate Anesthesia Transfer of Care Note  Patient: Rachel Underwood  Procedure(s) Performed: Procedure(s): HYSTERECTOMY ABDOMINAL (N/A) EXCISION OF CICATRIX (N/A) BILATERAL SALPINGECTOMY (Bilateral) EXCISION OF SKIN NEVOUS (N/A)  Patient Location: PACU  Anesthesia Type:General  Level of Consciousness: awake and patient cooperative  Airway & Oxygen Therapy: Patient Spontanous Breathing and Patient connected to face mask oxygen  Post-op Assessment: Report given to PACU RN, Post -op Vital signs reviewed and stable and Patient moving all extremities  Post vital signs: Reviewed and stable  Complications: No apparent anesthesia complications

## 2014-09-01 NOTE — Anesthesia Postprocedure Evaluation (Signed)
  Anesthesia Post-op Note  Patient: Rachel Underwood  Procedure(s) Performed: Procedure(s): HYSTERECTOMY ABDOMINAL (N/A) EXCISION OF CICATRIX (N/A) BILATERAL SALPINGECTOMY (Bilateral) EXCISION OF SKIN NEVUS (N/A)  Patient Location: PACU  Anesthesia Type:General  Level of Consciousness: awake, alert , oriented and patient cooperative  Airway and Oxygen Therapy: Patient Spontanous Breathing  Post-op Pain: 4 /10, moderate  Post-op Assessment: Post-op Vital signs reviewed, Patient's Cardiovascular Status Stable, Respiratory Function Stable, Patent Airway and No signs of Nausea or vomiting  Post-op Vital Signs: Reviewed and stable  Last Vitals:  Filed Vitals:   09/01/14 0730  BP: 108/70  Pulse:   Temp:   Resp: 37    Complications: No apparent anesthesia complications

## 2014-09-01 NOTE — Anesthesia Procedure Notes (Signed)
Procedure Name: Intubation Date/Time: 09/01/2014 7:47 AM Performed by: Charmaine Downs Pre-anesthesia Checklist: Suction available, Patient being monitored, Emergency Drugs available and Patient identified Patient Re-evaluated:Patient Re-evaluated prior to inductionOxygen Delivery Method: Circle system utilized Preoxygenation: Pre-oxygenation with 100% oxygen Intubation Type: IV induction, Rapid sequence and Cricoid Pressure applied Ventilation: Mask ventilation without difficulty Laryngoscope Size: Mac and 3 Grade View: Grade I Tube type: Oral Tube size: 7.0 mm Number of attempts: 1 Airway Equipment and Method: Stylet Placement Confirmation: ETT inserted through vocal cords under direct vision,  positive ETCO2 and breath sounds checked- equal and bilateral Secured at: 21 cm Tube secured with: Tape Dental Injury: Teeth and Oropharynx as per pre-operative assessment

## 2014-09-01 NOTE — Op Note (Signed)
09/01/2014  10:06 AM  PATIENT:  Rachel Underwood  43 y.o. female  PRE-OPERATIVE DIAGNOSIS:  MENORRHAGIA DYSMENORRHEA DYSPAREUNIA SCAR REVISION  POST-OPERATIVE DIAGNOSIS:  MENORRHAGIA DYSMENORRHEA DYSPAREUNIA SCAR REVISION  PROCEDURE:  Procedure(s): HYSTERECTOMY ABDOMINAL (N/A) EXCISION OF CICATRIX (N/A) BILATERAL SALPINGECTOMY (Bilateral) EXCISION OF SKIN NEVUS (N/A) placement of sub q jp drain  SURGEON:  Surgeon(s) and Role:    * Jonnie Kind, MD - Primary  PHYSICIAN ASSISTANT:                              ASSISTANTS: [place] RN      ANESTHESIA:   general  EBL:  Total I/O In: 2100 [I.V.:2100] Out: 525 [Urine:350; Blood:175]  BLOOD ADMINISTERED:none  DRAINS: (1) Jackson-Pratt drain(s) with closed bulb suction in the sub Q space and Urinary Catheter (Foley)   LOCAL MEDICATIONS USED:  OTHER exparel x 20 cc, diluted   SPECIMEN:  Source of Specimen:  uterus , cervix, fallopian tubes, skin nevus  DISPOSITION OF SPECIMEN:  PATHOLOGY  COUNTS:  YES  TOURNIQUET:  * No tourniquets in log *  DICTATION: .Dragon Dictation  PLAN OF CARE: Admit to inpatient   PATIENT DISPOSITION:  PACU - hemodynamically stable.   Delay start of Pharmacological VTE agent (>24hrs) due to surgical blood loss or risk of bleeding: not applicable    details of procedure: Patient was taken operating room prepped and draped for lower abdominal surgery, with the old poorly healed C-section incision scar marked for excision, a 30 cm transverse to 10 cm wide ellipse of skin and underlying fatty tissue was removed. This removed the deep defect that was chronically irritated on the lower abdomen. The fatty tissue was fairly thin, an and decision was made to perform the fascial incision via Pfannenstiel incision. The peritoneum was entered to the midline without difficulty without complications, and the bowel packed away with Balfour retractor in place. Attention was directed to the pelvis. There was no  major adhesions to the anterior abdominal wall. The omentum was slightly adherent to the anterior peritoneal wall at the site of the previous cesarean incision. This was taken down sharply and the tips of omental adhesions inspected for hemostasis. Attention was then directed to the uterus, with round ligaments clamped cut and suture ligated bilaterally, then the utero-ovarian ligaments isolated on either side clamping then transecting and suture ligating with 0 chromic. There was evidence of prior tubal sterilization and on the left side there was a small hydrosalpinx proximally. The proximal portion of the tube was taken off with the uterus. The uterine vessels were skeletonized, crossclamped with curved Heaney clamp, transected and suture ligated. The upper and lower cardinal ligaments were then isolated clamped cut and suture ligated after the bladder flap and then been developed anteriorly. There was some thin adhesions and the site of the old C-section scar but these were taken down sharply using knife dissection and/or Bovie cautery without difficulty. A stab incision was made in the anterior cervicovaginal fornix, and the cervix amputated off the vaginal cuff, until clamp used to to hold the vaginal cuff in place. A figure-of-eight suture was placed in the backside of the cuff to reduce the distance between the uterosacral ligaments. The remainder of the cuff was closed using an Aldridge stitch in each vaginal angle, followed by interrupted 0 chromic sutures pulling front to back edges of the cuff together with good hemostasis and tissue its approximation. Pedicles were inspected confirmed as  hemostatic. The distal portion the fallopian tube on each side was then amputated using Kelly clamp, 2-0 chromic ligature with good hemostasis. Sponge and needle counts were correct. Laparotomy equipment was removed. Anterior peritoneum was closed using running 2-0 chromic, the fascia closed with running 0 Vicryl,,  the subcutaneous tissues were reapproximated using interrupted 2-0 plain, then subcuticular 4-0 Vicryl close the skin. Prior skin closure a deep subcutaneous Jackson-Pratt drain was placed and was allowed to exit through stab incision inferior to the incision just to the right of the midline. The site chosen for this was the site of a large broad skin tag that required removal due to uncertain tissue character this was sent as a histologic specimen. Drain was sutured to the skin with good hemostasis for the case with estimated blood loss 175 cc sponge and needle counts were correct. Condition to recovery room good

## 2014-09-01 NOTE — Progress Notes (Signed)
Red rash to left inner arm and one red area to right ac. Area not raised. No drainage. States "this happens when I get nervous."

## 2014-09-02 ENCOUNTER — Encounter (HOSPITAL_COMMUNITY): Payer: Self-pay | Admitting: Obstetrics and Gynecology

## 2014-09-02 LAB — CBC
HCT: 33.7 % — ABNORMAL LOW (ref 36.0–46.0)
Hemoglobin: 11.5 g/dL — ABNORMAL LOW (ref 12.0–15.0)
MCH: 32.4 pg (ref 26.0–34.0)
MCHC: 34.1 g/dL (ref 30.0–36.0)
MCV: 94.9 fL (ref 78.0–100.0)
Platelets: 151 10*3/uL (ref 150–400)
RBC: 3.55 MIL/uL — AB (ref 3.87–5.11)
RDW: 13.1 % (ref 11.5–15.5)
WBC: 8.4 10*3/uL (ref 4.0–10.5)

## 2014-09-02 LAB — BASIC METABOLIC PANEL
Anion gap: 9 (ref 5–15)
BUN: 9 mg/dL (ref 6–23)
CALCIUM: 8.2 mg/dL — AB (ref 8.4–10.5)
CO2: 25 meq/L (ref 19–32)
CREATININE: 0.67 mg/dL (ref 0.50–1.10)
Chloride: 100 mEq/L (ref 96–112)
GFR calc Af Amer: >90 mL/min (ref >90)
GFR calc non Af Amer: >90 mL/min (ref >90)
Glucose, Bld: 123 mg/dL — ABNORMAL HIGH (ref 70–99)
Potassium: 3.7 mEq/L (ref 3.7–5.3)
Sodium: 134 mEq/L — ABNORMAL LOW (ref 137–147)

## 2014-09-02 MED ORDER — HYDROMORPHONE HCL 1 MG/ML IJ SOLN
1.0000 mg | INTRAMUSCULAR | Status: DC | PRN
  Administered 2014-09-02 (×2): 1 mg via INTRAVENOUS
  Filled 2014-09-02 (×3): qty 1

## 2014-09-02 MED ORDER — ALUM & MAG HYDROXIDE-SIMETH 200-200-20 MG/5ML PO SUSP: 30.0000 mL | ORAL | Status: DC | PRN

## 2014-09-02 MED ORDER — ALUM HYDROXIDE-MAG TRISILICATE 80-20 MG PO CHEW
2.0000 | CHEWABLE_TABLET | Freq: Three times a day (TID) | ORAL | Status: DC | PRN
  Filled 2014-09-02: qty 2

## 2014-09-02 NOTE — Anesthesia Postprocedure Evaluation (Signed)
  Anesthesia Post-op Note  Patient: Rachel Underwood  Procedure(s) Performed: Procedure(s): HYSTERECTOMY ABDOMINAL (N/A) EXCISION OF CICATRIX (N/A) BILATERAL SALPINGECTOMY (Bilateral) EXCISION OF SKIN NEVUS (N/A)  Patient Location: Nursing Unit  Anesthesia Type:General  Level of Consciousness: awake, alert  and oriented  Airway and Oxygen Therapy: Patient Spontanous Breathing  Post-op Pain: mild  Post-op Assessment: Post-op Vital signs reviewed, Patient's Cardiovascular Status Stable, Respiratory Function Stable, Patent Airway and No signs of Nausea or vomiting  Post-op Vital Signs: Reviewed and stable  Last Vitals:  Filed Vitals:   09/02/14 0542  BP: 108/59  Pulse: 84  Temp: 37.4 C  Resp: 16    Complications: No apparent anesthesia complications

## 2014-09-02 NOTE — Progress Notes (Signed)
Pt states she does not want the PCA pump and she is tired of having to wear oxygen. PCA discontinued at this time. Spoke with Dr.Ferguson earlier this shift and he stated that he was going to put an order in to discontinue PCA pump from home but the order has not come through at this time. Due to having this conversation with Dr.Ferguson, pump is discontinued via nursing judgement and patient request. Pt voices understanding that she does not have anything stronger than Toradol to control pain.

## 2014-09-02 NOTE — Progress Notes (Signed)
1 Day Post-Op Procedure(s) (LRB): HYSTERECTOMY ABDOMINAL (N/A) EXCISION OF CICATRIX (N/A) BILATERAL SALPINGECTOMY (Bilateral) EXCISION OF SKIN NEVUS (N/A)  Subjective: Patient reports incisional pain, tolerating PO and no problems voiding.    Objective: I have reviewed patient's vital signs, intake and output, medications and labs.  General: alert, cooperative, no distress and morbidly obese Resp: clear to auscultation bilaterally GI: soft, non-tender; bowel sounds normal; no masses,  no organomegaly and incision: clean, dry, intact and JP moderate drainage, clear drainage present CBC    Component Value Date/Time   WBC 8.4 09/02/2014 0559   RBC 3.55* 09/02/2014 0559   HGB 11.5* 09/02/2014 0559   HCT 33.7* 09/02/2014 0559   PLT 151 09/02/2014 0559   MCV 94.9 09/02/2014 0559   MCH 32.4 09/02/2014 0559   MCHC 34.1 09/02/2014 0559   RDW 13.1 09/02/2014 0559   LYMPHSABS 2.6 04/02/2014 1111   MONOABS 0.5 04/02/2014 1111   EOSABS 0.1 04/02/2014 1111   BASOSABS 0.0 04/02/2014 1111    BMET    Component Value Date/Time   NA 134* 09/02/2014 0559   K 3.7 09/02/2014 0559   CL 100 09/02/2014 0559   CO2 25 09/02/2014 0559   GLUCOSE 123* 09/02/2014 0559   BUN 9 09/02/2014 0559   CREATININE 0.67 09/02/2014 0559   CREATININE 0.73 06/03/2014 1455   CALCIUM 8.2* 09/02/2014 0559   GFRNONAA >90 09/02/2014 0559   GFRNONAA >89 07/15/2013 1223   GFRAA >90 09/02/2014 0559   GFRAA >89 07/15/2013 1223     Assessment: s/p Procedure(s): HYSTERECTOMY ABDOMINAL (N/A) EXCISION OF CICATRIX (N/A) BILATERAL SALPINGECTOMY (Bilateral) EXCISION OF SKIN NEVUS (N/A): stable and progressing well  Plan: Advance diet Discontinue IV fluids  LOS: 1 day    Rachel Underwood 09/02/2014, 8:27 AM

## 2014-09-02 NOTE — Care Management Note (Unsigned)
    Page 1 of 1   09/02/2014     4:54:01 PM CARE MANAGEMENT NOTE 09/02/2014  Patient:  Rachel Underwood, Rachel Underwood   Account Number:  [id number]  Date Initiated:  09/02/2014  Documentation initiated by:  Vladimir Creeks  Subjective/Objective Assessment:   patient admitted following hysterectomy. Patient is from home, with spouse,  is independent of return home at discharge     Action/Plan:   no needs identified   Anticipated DC Date:  09/03/2014   Anticipated DC Plan:  [place]  CM consult      Choice offered to / List presented to:             Status of service:  In process, will continue to follow Medicare Important Message given?   (If response is "NO", the following Medicare IM given date fields will be blank) Date Medicare IM given:   Medicare IM given by:   Date Additional Medicare IM given:   Additional Medicare IM given by:    Discharge Disposition:    Per UR Regulation:  Reviewed for med. necessity/level of care/duration of stay  If discussed at [place] of Stay Meetings, dates discussed:    Comments:  09/02/14 1650 , Vladimir Creeks RN/CM

## 2014-09-02 NOTE — Addendum Note (Signed)
Addendum created 09/02/14 0959 by Ollen Bowl, CRNA   Modules edited: Notes Section   Notes Section:  File: [id number]

## 2014-09-03 ENCOUNTER — Telehealth: Payer: Self-pay | Admitting: Obstetrics and Gynecology

## 2014-09-03 MED ORDER — DSS 100 MG PO CAPS: 100.0000 mg | ORAL_CAPSULE | Freq: Two times a day (BID) | ORAL | Status: DC

## 2014-09-03 MED ORDER — OXYCODONE-ACETAMINOPHEN 5-325 MG PO TABS: 1.0000 | ORAL_TABLET | ORAL | Status: DC | PRN

## 2014-09-03 MED ORDER — IBUPROFEN 600 MG PO TABS: 600.0000 mg | ORAL_TABLET | Freq: Four times a day (QID) | ORAL | Status: DC | PRN

## 2014-09-03 MED ORDER — OXYCODONE-ACETAMINOPHEN 5-325 MG PO TABS
1.0000 | ORAL_TABLET | ORAL | Status: DC | PRN
  Administered 2014-09-03: 1 via ORAL
  Filled 2014-09-03: qty 1

## 2014-09-03 NOTE — Discharge Instructions (Signed)
Abdominal Hysterectomy °Abdominal hysterectomy is a surgery to remove your womb (uterus). Your womb is the part of your body that contains a growing baby. The surgery may be done for many reasons. These may include cancer, growths (tumors), long-term pain, or bleeding. You may also need other reproductive parts removed during this surgery. This will depend on why you need to have the surgery. °BEFORE THE PROCEDURE °· Talk to your doctor about the changes to your body. These changes may be physical and emotional. °· You may need to have blood work done. You may also need X-rays done. °· Quit smoking if you smoke. Ask your doctor for help. °· Stop taking medicines that thin your blood as told by your doctor. °· Your doctor may have you take other medicines. Take all medicines as told by your doctor. °· Do not eat or drink anything for 6-8 hours before surgery. °· Take your normal medicines with a small sip of water. °· Shower or take a bath the night or morning before surgery. °PROCEDURE °· This surgery is done in the hospital. °· You are given a medicine that makes you go to sleep (general anesthetic). °· The doctor will make a cut (incision) through the skin in your lower belly. °· The cut may be about 5-7 inches long. It may go side-to-side or up-and-down. °· The doctor will move the body tissue that covers your womb. The doctor will carefully remove your womb. The doctor may remove any other reproductive parts that need to be removed. °· The doctor will use clamps or stitches (sutures) to control bleeding. °· The doctor will close your cut with stitches or metal clips. °AFTER THE PROCEDURE °· You will have pain right after the procedure. °· You will be given pain medicine in the recovery room. °· You will be taken to your hospital room after the medicines that made you go to sleep wear off. °· You will be told how to take care of yourself at home. °Document Released: 11/25/2013 Document Reviewed:  11/25/2013 °[employer]® Patient Information ©2015 [employer]. This information is not intended to replace advice given to you by your health care provider. Make sure you discuss any questions you have with your health care provider. ° °

## 2014-09-03 NOTE — Discharge Summary (Signed)
Physician Discharge Summary  Patient ID: Rachel Underwood MRN: [id number] DOB/AGE: 41-11-1971 41 y.o.  Admit date: 09/01/2014 Discharge date: 09/03/2014  Admission Diagnoses: Menorrhagia, dysmenorrhea dyspareunia abdominal scarring status post poorly healed abdominal incision, morbid obesity  Discharge Diagnoses: Same Active Problems:   S/P hysterectomy   Discharged Condition: good  Hospital Course: Patient was admitted underwent abdominal hysterectomy bilateral salpingectomy and removal of the old poorly healed abdominal scar from her cesarean section many years ago that healed and by secondary intention. She had an uncomplicated postoperative course was able to go home on postoperative day 2 with adequate pain control on Percocet and ibuprofen.  Consults: None  Significant Diagnostic Studies: labs:  CBC Latest Ref Rng 09/02/2014 08/26/2014 06/03/2014  WBC 4.0 - 10.5 K/uL 8.4 6.6 8.6  Hemoglobin 12.0 - 15.0 g/dL 11.5(L) 13.2 13.3  Hematocrit 36.0 - 46.0 % 33.7(L) 38.0 38.3  Platelets 150 - 400 K/uL 151 171 248   BMET    Component Value Date/Time   NA 134* 09/02/2014 0559   K 3.7 09/02/2014 0559   CL 100 09/02/2014 0559   CO2 25 09/02/2014 0559   GLUCOSE 123* 09/02/2014 0559   BUN 9 09/02/2014 0559   CREATININE 0.67 09/02/2014 0559   CREATININE 0.73 06/03/2014 1455   CALCIUM 8.2* 09/02/2014 0559   GFRNONAA >90 09/02/2014 0559   GFRNONAA >89 07/15/2013 1223   GFRAA >90 09/02/2014 0559   GFRAA >89 07/15/2013 1223       Treatments: surgery: Abdominal hysterectomy bilateral salpingectomy, removal of old scar, placement of subcutaneous J-P drain  Discharge Exam: Blood pressure 113/70, pulse 64, temperature 98.5 F (36.9 C), temperature source Oral, resp. rate 16, height 5\' 5"  (1.651 m), weight 283 lb (128.368 kg), SpO2 98.00%. General appearance: alert and mild distress GI: soft, non-tender; bowel sounds normal; no masses,  no organomegaly and Incision dressing dry clear fluid from JP  drain  Disposition: 01-Home or Self Care  Discharge Instructions   Call MD for:  temperature >100.4    Complete by:  As directed      Diet - low sodium heart healthy    Complete by:  As directed      Discharge instructions    Complete by:  As directed   Empty drain daily and record the volume removed     Driving Restrictions    Complete by:  As directed   None x2 weeks     Increase activity slowly    Complete by:  As directed      Sexual Activity Restrictions    Complete by:  As directed   None x6 weeks            Medication List         acyclovir 400 MG tablet  Commonly known as:  ZOVIRAX  Take 400 mg by mouth 2 (two) times daily as needed (for prophylaxis).     albuterol 108 (90 BASE) MCG/ACT inhaler  Commonly known as:  PROVENTIL HFA;VENTOLIN HFA  Inhale 2 puffs into the lungs every 4 (four) hours as needed for wheezing or shortness of breath.     atenolol 100 MG tablet  Commonly known as:  TENORMIN  Take 1 tablet (100 mg total) by mouth daily.     cyclobenzaprine 5 MG tablet  Commonly known as:  FLEXERIL  Take 5 mg by mouth 3 (three) times daily as needed for muscle spasms.     DSS 100 MG Caps  Take 100 mg by mouth 2 (  two) times daily.     elvitegravir-cobicistat-emtricitabine-tenofovir 150-150-200-300 MG Tabs tablet  Commonly known as:  STRIBILD  Take 1 tablet by mouth daily with breakfast.     hydrOXYzine 50 MG tablet  Commonly known as:  ATARAX/VISTARIL  Take 50 mg by mouth 3 (three) times daily as needed. Takes BID-TID daily     ibuprofen 600 MG tablet  Commonly known as:  ADVIL,MOTRIN  Take 1 tablet (600 mg total) by mouth every 6 (six) hours as needed (mild pain).     ibuprofen 800 MG tablet  Commonly known as:  ADVIL,MOTRIN  Take 800 mg by mouth every 6 (six) hours as needed for moderate pain.     meloxicam 7.5 MG tablet  Commonly known as:  MOBIC  Take 7.5-15 mg by mouth as needed for pain.     OXcarbazepine 150 MG tablet  Commonly known  as:  TRILEPTAL  Take 150 mg by mouth at bedtime.     oxyCODONE-acetaminophen 5-325 MG per tablet  Commonly known as:  PERCOCET/ROXICET  Take 1-2 tablets by mouth every 4 (four) hours as needed for severe pain (moderate to severe pain (when tolerating fluids)).     Pitavastatin Calcium 4 MG Tabs  Take 4 mg by mouth at bedtime. Pitavastatin 4mg  or placebo     PRILOSEC PO  Take 20 mg by mouth daily.     sertraline 100 MG tablet  Commonly known as:  ZOLOFT  Take 200 mg by mouth every morning. Takes 2 daily           Follow-up Information   Follow up with Jonnie Kind, MD In 1 week. (For wound re-check)    Specialties:  Obstetrics and Gynecology, Radiology   Contact information:   [place] [address] [phone]       Signed: Jonnie Kind 09/03/2014, 8:54 AM

## 2014-09-08 NOTE — Telephone Encounter (Signed)
Pt had marked out one of the Rx's she was handed in print version when d/c from hospital .  Pt ended up getting all her meds.

## 2014-09-09 ENCOUNTER — Ambulatory Visit (INDEPENDENT_AMBULATORY_CARE_PROVIDER_SITE_OTHER): Payer: Medicaid Other | Admitting: Obstetrics and Gynecology

## 2014-09-09 ENCOUNTER — Encounter: Payer: Self-pay | Admitting: Obstetrics and Gynecology

## 2014-09-09 VITALS — BP 130/82 | Ht 65.0 in | Wt 286.0 lb

## 2014-09-09 DIAGNOSIS — Z9889 Other specified postprocedural states: Secondary | ICD-10-CM

## 2014-09-09 DIAGNOSIS — B20 Human immunodeficiency virus [HIV] disease: Secondary | ICD-10-CM

## 2014-09-09 DIAGNOSIS — Z09 Encounter for follow-up examination after completed treatment for conditions other than malignant neoplasm: Secondary | ICD-10-CM

## 2014-09-09 MED ORDER — HYDROCHLOROTHIAZIDE 25 MG PO TABS: 25.0000 mg | ORAL_TABLET | Freq: Every day | ORAL | Status: DC

## 2014-09-09 NOTE — Progress Notes (Signed)
Patient ID: Rachel Underwood, female   DOB: 10/31/1971, 43 y.o.   MRN: [id number] Pt here for post op appointment for Hysterectomy Abdominal from 09/02/2014.

## 2014-09-09 NOTE — Progress Notes (Signed)
Subjective:     Rachel Underwood is a 43 y.o. female who presents to the clinic 1 weeks status post total abdominal hysterectomy for dysmenorrhea and menorrhagia with irregular cycles. Diet:       regular without difficulty. Bowel function is: normal. Pain:     pain is controlled with IBU  She currently denies being on a fluid pill.  The following portions of the patient's history were reviewed and updated as appropriate: allergies, current medications, past family history, past medical history, past social history, past surgical history and problem list.  Review of Systems Pertinent items are noted in HPI.    Objective:    BP 130/82  Ht 5\' 5"  (1.651 m)  Wt 286 lb (129.729 kg)  BMI 47.59 kg/m2  LMP 08/11/2014 General:  alert, cooperative and moderately obese  Abdomen: soft, bowel sounds active, non-tender  Incision:   healing well, no drainage, no erythema, no hernia, no seroma, mild edema, no dehiscence, incision well approximated           Assessment:    Doing well postoperatively. Operative findings again reviewed. Pathology report discussed.    Plan:    1. Continue any current medications. 2. Wound care discussed. 3. Activity restrictions: gradual increase activity, no sexual activity until final release 4. Anticipated return to work: not applicable. 5. Follow up: 4 weeks for  Final postop release.  RX HCTZ x 30 d   This chart was scribed for Jonnie Kind, MD by Steva Colder, ED Scribe. The patient was seen in room 2 at 2:23 PM.

## 2014-09-15 ENCOUNTER — Ambulatory Visit: Payer: Medicaid Other | Admitting: Gastroenterology

## 2014-09-18 ENCOUNTER — Emergency Department (HOSPITAL_COMMUNITY)
Admission: EM | Admit: 2014-09-18 | Discharge: 2014-09-18 | Disposition: A | Discharge status: Home / Self Care | Payer: Medicaid Other | Attending: Emergency Medicine | Admitting: Emergency Medicine

## 2014-09-18 ENCOUNTER — Encounter (HOSPITAL_COMMUNITY): Payer: Self-pay | Admitting: Emergency Medicine

## 2014-09-18 DIAGNOSIS — Z79899 Other long term (current) drug therapy: Secondary | ICD-10-CM | POA: Diagnosis not present

## 2014-09-18 DIAGNOSIS — Z8619 Personal history of other infectious and parasitic diseases: Secondary | ICD-10-CM | POA: Insufficient documentation

## 2014-09-18 DIAGNOSIS — K219 Gastro-esophageal reflux disease without esophagitis: Secondary | ICD-10-CM | POA: Diagnosis not present

## 2014-09-18 DIAGNOSIS — E669 Obesity, unspecified: Secondary | ICD-10-CM | POA: Diagnosis not present

## 2014-09-18 DIAGNOSIS — Z8742 Personal history of other diseases of the female genital tract: Secondary | ICD-10-CM | POA: Insufficient documentation

## 2014-09-18 DIAGNOSIS — Z21 Asymptomatic human immunodeficiency virus [HIV] infection status: Secondary | ICD-10-CM | POA: Diagnosis not present

## 2014-09-18 DIAGNOSIS — Z720 Tobacco use: Secondary | ICD-10-CM | POA: Insufficient documentation

## 2014-09-18 DIAGNOSIS — R1032 Left lower quadrant pain: Secondary | ICD-10-CM | POA: Diagnosis present

## 2014-09-18 DIAGNOSIS — G40909 Epilepsy, unspecified, not intractable, without status epilepticus: Secondary | ICD-10-CM | POA: Diagnosis not present

## 2014-09-18 DIAGNOSIS — I10 Essential (primary) hypertension: Secondary | ICD-10-CM | POA: Diagnosis not present

## 2014-09-18 DIAGNOSIS — F329 Major depressive disorder, single episode, unspecified: Secondary | ICD-10-CM | POA: Diagnosis not present

## 2014-09-18 DIAGNOSIS — M1990 Unspecified osteoarthritis, unspecified site: Secondary | ICD-10-CM | POA: Insufficient documentation

## 2014-09-18 DIAGNOSIS — N390 Urinary tract infection, site not specified: Secondary | ICD-10-CM

## 2014-09-18 DIAGNOSIS — J45909 Unspecified asthma, uncomplicated: Secondary | ICD-10-CM | POA: Diagnosis not present

## 2014-09-18 LAB — CBC WITH DIFFERENTIAL/PLATELET
BASOS ABS: 0 10*3/uL (ref 0.0–0.1)
Basophils Relative: 0 % (ref 0–1)
EOS PCT: 1 % (ref 0–5)
Eosinophils Absolute: 0.1 10*3/uL (ref 0.0–0.7)
HCT: 39.2 % (ref 36.0–46.0)
Hemoglobin: 13.6 g/dL (ref 12.0–15.0)
Lymphocytes Relative: 34 % (ref 12–46)
Lymphs Abs: 3.1 10*3/uL (ref 0.7–4.0)
MCH: 32 pg (ref 26.0–34.0)
MCHC: 34.7 g/dL (ref 30.0–36.0)
MCV: 92.2 fL (ref 78.0–100.0)
Monocytes Absolute: 0.8 10*3/uL (ref 0.1–1.0)
Monocytes Relative: 8 % (ref 3–12)
Neutro Abs: 5.1 10*3/uL (ref 1.7–7.7)
Neutrophils Relative %: 57 % (ref 43–77)
Platelets: 338 10*3/uL (ref 150–400)
RBC: 4.25 MIL/uL (ref 3.87–5.11)
RDW: 12.8 % (ref 11.5–15.5)
WBC: 9.1 10*3/uL (ref 4.0–10.5)

## 2014-09-18 LAB — URINE MICROSCOPIC-ADD ON

## 2014-09-18 LAB — COMPREHENSIVE METABOLIC PANEL
ALT: 14 U/L (ref 0–35)
ANION GAP: 11 (ref 5–15)
AST: 17 U/L (ref 0–37)
Albumin: 4.2 g/dL (ref 3.5–5.2)
Alkaline Phosphatase: 84 U/L (ref 39–117)
BUN: 11 mg/dL (ref 6–23)
CALCIUM: 10.1 mg/dL (ref 8.4–10.5)
CO2: 30 meq/L (ref 19–32)
CREATININE: 0.82 mg/dL (ref 0.50–1.10)
Chloride: 96 mEq/L (ref 96–112)
GFR calc Af Amer: >90 mL/min (ref >90)
GFR, EST NON AFRICAN AMERICAN: 88 mL/min — AB (ref >90)
Glucose, Bld: 93 mg/dL (ref 70–99)
Potassium: 3.8 mEq/L (ref 3.7–5.3)
Sodium: 137 mEq/L (ref 137–147)
Total Bilirubin: 0.4 mg/dL (ref 0.3–1.2)
Total Protein: 8.4 g/dL — ABNORMAL HIGH (ref 6.0–8.3)

## 2014-09-18 LAB — URINALYSIS, ROUTINE W REFLEX MICROSCOPIC
BILIRUBIN URINE: NEGATIVE
Glucose, UA: NEGATIVE mg/dL
HGB URINE DIPSTICK: NEGATIVE
KETONES UR: NEGATIVE mg/dL
Nitrite: NEGATIVE
PH: 5 (ref 5.0–8.0)
Protein, ur: NEGATIVE mg/dL
Specific Gravity, Urine: >1.03 — ABNORMAL HIGH (ref 1.005–1.030)
Urobilinogen, UA: 0.2 mg/dL (ref 0.0–1.0)

## 2014-09-18 MED ORDER — NITROFURANTOIN MONOHYD MACRO 100 MG PO CAPS: 100.0000 mg | ORAL_CAPSULE | Freq: Two times a day (BID) | ORAL | Status: DC

## 2014-09-18 MED ORDER — ONDANSETRON 8 MG PO TBDP
8.0000 mg | ORAL_TABLET | Freq: Once | ORAL | Status: AC
  Administered 2014-09-18: 8 mg via ORAL
  Filled 2014-09-18: qty 1

## 2014-09-18 MED ORDER — NITROFURANTOIN MONOHYD MACRO 100 MG PO CAPS
ORAL_CAPSULE | ORAL | Status: AC
  Administered 2014-09-18: 100 mg
  Filled 2014-09-18: qty 1

## 2014-09-18 MED ORDER — NITROFURANTOIN MACROCRYSTAL 100 MG PO CAPS
100.0000 mg | ORAL_CAPSULE | Freq: Once | ORAL | Status: DC
  Filled 2014-09-18: qty 1

## 2014-09-18 NOTE — ED Provider Notes (Signed)
CSN: [id number]     Arrival date & time 09/18/14  1825 History   First MD Initiated Contact with Patient 09/18/14 1951     Chief Complaint  Patient presents with  . Abdominal Pain     (Consider location/radiation/quality/duration/timing/severity/associated sxs/prior Treatment) HPI.... left lower abdominal pain described as a "knot" for 3 days. Status post abdominal hysterectomy on 10/01/2014 by Dr Mallory Shirk. No fever, vaginal discharge, vaginal bleeding, dysuria.  Questionable chills.  Patient's been eating normally without vomiting or diarrhea. Severity is mild.  Past Medical History  Diagnosis Date  . Depression   . HIV (human immunodeficiency virus infection)   . Hypertension   . Asthma   . Borderline personality disorder   . Menorrhagia with irregular cycle 06/03/2014  . Dysmenorrhea 06/03/2014  . Enlarged uterus 06/03/2014  . Herpes simplex without mention of complication   . Abnormal Pap smear of cervix 2005(EST)    ckc for abnl pap- [facility] [place]  . GERD (gastroesophageal reflux disease)   . Seizures     had as a teenager, unknown etiology and no meds.  . Fibromyalgia   . Osteoarthritis    Past Surgical History  Procedure Laterality Date  . Ankle surgery Right     repair of ligaments  . Back surgery    . Shoulder surgery Right     rotator cuff  . Cesarean section    . Ckc    . Abdominal hysterectomy N/A 09/01/2014    Procedure: HYSTERECTOMY ABDOMINAL;  Surgeon: Jonnie Kind, MD;  Location: [facility];  Service: Gynecology;  Laterality: N/A;  . Scar revision N/A 09/01/2014    Procedure: EXCISION OF CICATRIX;  Surgeon: Jonnie Kind, MD;  Location: [facility];  Service: Gynecology;  Laterality: N/A;  . Bilateral salpingectomy Bilateral 09/01/2014    Procedure: BILATERAL SALPINGECTOMY;  Surgeon: Jonnie Kind, MD;  Location: [facility];  Service: Gynecology;  Laterality: Bilateral;  . Excision of skin tag N/A 09/01/2014    Procedure: EXCISION OF SKIN NEVUS;   Surgeon: Jonnie Kind, MD;  Location: [facility];  Service: Gynecology;  Laterality: N/A;   Family History  Problem Relation Age of Onset  . Fibroids Mother   . Hypertension Mother   . Depression Mother   . Hypertension Father   . Mental illness Father   . Heart disease Maternal Grandmother   . Cancer Maternal Grandmother     breast  . Bipolar disorder Daughter   . Schizophrenia Daughter   . Cancer Paternal Grandfather     prostate  . Heart disease Paternal Grandfather   . Dementia Paternal Grandmother   . Heart disease Maternal Grandfather   . Alzheimer's disease Maternal Grandfather   . Anxiety disorder Daughter   . ADD / ADHD Son    History  Substance Use Topics  . Smoking status: Current Every Day Smoker -- 0.50 packs/day for 30 years    Types: Cigarettes  . Smokeless tobacco: Never Used  . Alcohol Use: No   OB History   Grav Para Term Preterm Abortions TAB SAB Ect Mult Living   10 3   7  7   3      Review of Systems  All other systems reviewed and are negative.     Allergies  Codeine; Gabapentin; Haldol; Invega; Lithium; and Lexapro  Home Medications   Prior to Admission medications   Medication Sig Start Date End Date Taking? Authorizing Provider  atenolol (TENORMIN) 100 MG tablet Take  1 tablet (100 mg total) by mouth daily. 12/24/13  Yes Truman Hayward, MD  cyclobenzaprine (FLEXERIL) 5 MG tablet Take 5 mg by mouth 3 (three) times daily as needed for muscle spasms.   Yes Historical Provider, MD  diphenhydrAMINE (SOMINEX) 25 MG tablet Take 50 mg by mouth daily as needed for itching or sleep.   Yes Historical Provider, MD  docusate sodium 100 MG CAPS Take 100 mg by mouth 2 (two) times daily. 09/03/14  Yes Jonnie Kind, MD  elvitegravir-cobicistat-emtricitabine-tenofovir (STRIBILD) 150-150-200-300 MG TABS tablet Take 1 tablet by mouth daily with breakfast. 01/16/14  Yes Carlyle Basques, MD  hydrochlorothiazide (HYDRODIURIL) 25 MG tablet Take 1 tablet (25 mg  total) by mouth daily. 09/09/14  Yes Jonnie Kind, MD  hydrOXYzine (ATARAX/VISTARIL) 50 MG tablet Take 150 mg by mouth at bedtime. Takes BID-TID daily   Yes Historical Provider, MD  ibuprofen (ADVIL,MOTRIN) 600 MG tablet Take 1 tablet (600 mg total) by mouth every 6 (six) hours as needed (mild pain). 09/03/14  Yes Jonnie Kind, MD  omeprazole (PRILOSEC) 20 MG capsule Take 20 mg by mouth daily.   Yes Historical Provider, MD  OXcarbazepine (TRILEPTAL) 150 MG tablet Take 150 mg by mouth at bedtime.    Yes Historical Provider, MD  Pitavastatin Calcium 4 MG TABS Take 4 mg by mouth at bedtime. Pitavastatin 4mg  or placebo 05/19/14  Yes Carlyle Basques, MD  sertraline (ZOLOFT) 100 MG tablet Take 200 mg by mouth every morning. Takes 2 daily   Yes Historical Provider, MD  acyclovir (ZOVIRAX) 400 MG tablet Take 400 mg by mouth 2 (two) times daily as needed (for prophylaxis).    Historical Provider, MD  albuterol (PROVENTIL HFA;VENTOLIN HFA) 108 (90 BASE) MCG/ACT inhaler Inhale 2 puffs into the lungs every 4 (four) hours as needed for wheezing or shortness of breath.    Historical Provider, MD  nitrofurantoin, macrocrystal-monohydrate, (MACROBID) 100 MG capsule Take 1 capsule (100 mg total) by mouth 2 (two) times daily. X 7 days 09/18/14   Nat Christen, MD  oxyCODONE-acetaminophen (PERCOCET/ROXICET) 5-325 MG per tablet Take 1-2 tablets by mouth every 4 (four) hours as needed for severe pain (moderate to severe pain (when tolerating fluids)). 09/03/14   Jonnie Kind, MD   BP 133/82  Pulse 79  Temp(Src) 98 F (36.7 C) (Oral)  Resp 18  Ht 5\' 5"  (1.651 m)  Wt 283 lb (128.368 kg)  BMI 47.09 kg/m2  SpO2 100%  LMP 08/11/2014 Physical Exam  Nursing note and vitals reviewed. Constitutional: She is oriented to person, place, and time. She appears well-developed and well-nourished.  Obese, no acute distress  HENT:  Head: Normocephalic and atraumatic.  Eyes: Conjunctivae and EOM are normal. Pupils are equal,  round, and reactive to light.  Neck: Normal range of motion. Neck supple.  Cardiovascular: Normal rate, regular rhythm and normal heart sounds.   Pulmonary/Chest: Effort normal and breath sounds normal.  Abdomen is nontender. Horizontal scar in lower abdomen. No induration or abscess palpated in left lower quadrant.  Abdominal: Soft. Bowel sounds are normal.  Musculoskeletal: Normal range of motion.  Neurological: She is alert and oriented to person, place, and time.  Skin: Skin is warm and dry.  Psychiatric: She has a normal mood and affect. Her behavior is normal.    ED Course  Procedures (including critical care time) Labs Review Labs Reviewed  COMPREHENSIVE METABOLIC PANEL - Abnormal; Notable for the following:    Total Protein 8.4 (*)  GFR calc non Af Amer 88 (*)    All other components within normal limits  URINALYSIS, ROUTINE W REFLEX MICROSCOPIC - Abnormal; Notable for the following:    Specific Gravity, Urine >1.030 (*)    Leukocytes, UA MODERATE (*)    All other components within normal limits  URINE MICROSCOPIC-ADD ON - Abnormal; Notable for the following:    Squamous Epithelial / LPF FEW (*)    Bacteria, UA FEW (*)    All other components within normal limits  URINE CULTURE  CBC WITH DIFFERENTIAL    Imaging Review No results found.   EKG Interpretation None      MDM   Final diagnoses:  UTI (lower urinary tract infection)    No clinical evidence for bowel obstruction, mass, abscess.   No acute abdomen. Urinalysis shows evidence of infection. Rx Macrobid, or Zofran 8 mg ODT     Nat Christen, MD 09/19/14 1352

## 2014-09-18 NOTE — Discharge Instructions (Signed)
You have a urinary tract infection. Increase fluids. Antibiotic twice a day for week. Followup your gynecologist

## 2014-09-18 NOTE — ED Notes (Signed)
Pt reports had hysterectomy on September 29th. Pt reports a "knot" has developed under surgical site x3 days ago. Pt reports nausea/vomited started today. Pt reports called surgeon prior to arrival and was told to come here. nad noted.

## 2014-09-20 LAB — URINE CULTURE: Special Requests: NORMAL

## 2014-09-21 ENCOUNTER — Encounter: Payer: Self-pay | Admitting: *Deleted

## 2014-09-29 ENCOUNTER — Telehealth: Payer: Self-pay | Admitting: Obstetrics and Gynecology

## 2014-09-29 ENCOUNTER — Other Ambulatory Visit: Payer: Medicaid Other

## 2014-09-29 DIAGNOSIS — Z79899 Other long term (current) drug therapy: Secondary | ICD-10-CM

## 2014-09-29 DIAGNOSIS — Z113 Encounter for screening for infections with a predominantly sexual mode of transmission: Secondary | ICD-10-CM

## 2014-09-29 DIAGNOSIS — B20 Human immunodeficiency virus [HIV] disease: Secondary | ICD-10-CM

## 2014-09-29 MED ORDER — DSS 100 MG PO CAPS: 100.0000 mg | ORAL_CAPSULE | Freq: Two times a day (BID) | ORAL | Status: DC

## 2014-09-29 NOTE — Telephone Encounter (Signed)
Dr. Glo Herring gave a verbal order for a refill of her colace plus refills. I called and advised the pt to check with her pharmacy a little later this afternoon. PT verbalized understanding.

## 2014-09-30 LAB — LIPID PANEL
CHOLESTEROL: 212 mg/dL — AB (ref 0–200)
HDL: 38 mg/dL — ABNORMAL LOW (ref >39)
LDL Cholesterol: 129 mg/dL — ABNORMAL HIGH (ref 0–99)
TRIGLYCERIDES: 226 mg/dL — AB (ref <150)
Total CHOL/HDL Ratio: 5.6 Ratio
VLDL: 45 mg/dL — ABNORMAL HIGH (ref 0–40)

## 2014-09-30 LAB — T-HELPER CELL (CD4) - (RCID CLINIC ONLY)
CD4 % Helper T Cell: 28 % — ABNORMAL LOW (ref 33–55)
CD4 T Cell Abs: 950 /uL (ref 400–2700)

## 2014-09-30 LAB — RPR

## 2014-09-30 LAB — HIV-1 RNA QUANT-NO REFLEX-BLD: HIV-1 RNA Quant, Log: <1.3 {Log} (ref <1.30)

## 2014-10-05 ENCOUNTER — Encounter (HOSPITAL_COMMUNITY): Payer: Self-pay | Admitting: Emergency Medicine

## 2014-10-07 ENCOUNTER — Encounter: Payer: Medicaid Other | Admitting: Obstetrics and Gynecology

## 2014-10-07 ENCOUNTER — Ambulatory Visit: Payer: Medicaid Other | Admitting: Cardiovascular Disease

## 2014-10-13 ENCOUNTER — Ambulatory Visit: Payer: Medicaid Other | Admitting: Internal Medicine

## 2014-10-14 ENCOUNTER — Ambulatory Visit (INDEPENDENT_AMBULATORY_CARE_PROVIDER_SITE_OTHER): Payer: Medicaid Other | Admitting: Internal Medicine

## 2014-10-14 ENCOUNTER — Encounter: Payer: Self-pay | Admitting: Internal Medicine

## 2014-10-14 VITALS — BP 119/82 | HR 54 | Temp 98.1°F | Wt 276.0 lb

## 2014-10-14 DIAGNOSIS — R110 Nausea: Secondary | ICD-10-CM

## 2014-10-14 DIAGNOSIS — B20 Human immunodeficiency virus [HIV] disease: Secondary | ICD-10-CM

## 2014-10-14 DIAGNOSIS — R350 Frequency of micturition: Secondary | ICD-10-CM

## 2014-10-14 MED ORDER — ONDANSETRON HCL 8 MG PO TABS: 8.0000 mg | ORAL_TABLET | Freq: Three times a day (TID) | ORAL | Status: DC | PRN

## 2014-10-14 NOTE — Progress Notes (Signed)
Patient ID: Rachel Underwood, female   DOB: 19-Nov-1971, 43 y.o.   MRN: [id number]       Patient ID: Rachel Underwood, female   DOB: 12/18/1970, 43 y.o.   MRN: [id number]  HPI Rachel Underwood is a 43yo F with well controlled hiv disease-schizophrenia, CD 4 count of 950/VL<20 on stribild. Since we last saw her 6 months ago, she had abdominal hysterectomy with BSO on 08/12/2014. Healed well. Did go to ED in early October for lower abdominal pain. She is doing well otherwise. She states that she is getting better rest, not being the primary care giver for her granddaughter, now caring for an ailing uncle.  Outpatient Encounter Prescriptions as of 10/14/2014  Medication Sig  . acyclovir (ZOVIRAX) 400 MG tablet Take 400 mg by mouth 2 (two) times daily as needed (for prophylaxis).  Marland Kitchen albuterol (PROVENTIL HFA;VENTOLIN HFA) 108 (90 BASE) MCG/ACT inhaler Inhale 2 puffs into the lungs every 4 (four) hours as needed for wheezing or shortness of breath.  Marland Kitchen atenolol (TENORMIN) 100 MG tablet Take 1 tablet (100 mg total) by mouth daily.  . cyclobenzaprine (FLEXERIL) 5 MG tablet Take 5 mg by mouth 3 (three) times daily as needed for muscle spasms.  . diphenhydrAMINE (SOMINEX) 25 MG tablet Take 50 mg by mouth daily as needed for itching or sleep.  Rachel Underwood Sodium (DSS) 100 MG CAPS Take 100 mg by mouth 2 (two) times daily.  Marland Kitchen elvitegravir-cobicistat-emtricitabine-tenofovir (STRIBILD) 150-150-200-300 MG TABS tablet Take 1 tablet by mouth daily with breakfast.  . hydrochlorothiazide (HYDRODIURIL) 25 MG tablet Take 1 tablet (25 mg total) by mouth daily.  . hydrOXYzine (ATARAX/VISTARIL) 50 MG tablet Take 150 mg by mouth at bedtime. Takes BID-TID daily  . ibuprofen (ADVIL,MOTRIN) 600 MG tablet Take 1 tablet (600 mg total) by mouth every 6 (six) hours as needed (mild pain).  . nitrofurantoin, macrocrystal-monohydrate, (MACROBID) 100 MG capsule Take 1 capsule (100 mg total) by mouth 2 (two) times daily. X 7 days  . omeprazole (PRILOSEC) 20 MG  capsule Take 20 mg by mouth daily.  . OXcarbazepine (TRILEPTAL) 150 MG tablet Take 150 mg by mouth at bedtime.   Marland Kitchen oxyCODONE-acetaminophen (PERCOCET/ROXICET) 5-325 MG per tablet Take 1-2 tablets by mouth every 4 (four) hours as needed for severe pain (moderate to severe pain (when tolerating fluids)).  Marland Kitchen Pitavastatin Calcium 4 MG TABS Take 4 mg by mouth at bedtime. Pitavastatin 4mg  or placebo  . sertraline (ZOLOFT) 100 MG tablet Take 200 mg by mouth every morning. Takes 2 daily     Patient Active Problem List   Diagnosis Date Noted  . S/P hysterectomy 09/01/2014  . Pelvic pain in female 07/22/2014  . Anovulation 06/18/2014  . DUB (dysfunctional uterine bleeding) 06/18/2014  . Abdominal wall pain 06/18/2014  . Menorrhagia with irregular cycle 06/03/2014  . Enlarged uterus 06/03/2014  . HTN (hypertension) 01/15/2014  . Influenza with other respiratory manifestations 01/07/2014  . Human immunodeficiency virus (HIV) disease 07/17/2013  . Schizophrenia 07/17/2013  . Bipolar disorder, unspecified 07/17/2013     There are no preventive care reminders to display for this patient.   Review of Systems Feels she is unable to empty her bladder completely, no dysuria, no hematuria, possibly more frequent urination Physical Exam   BP 119/82 mmHg  Pulse 54  Temp(Src) 98.1 F (36.7 C) (Oral)  Wt 276 lb (125.193 kg)  LMP 08/11/2014 Physical Exam  Constitutional:  oriented to person, place, and time. appears well-developed and well-nourished. No distress.  HENT:  Mouth/Throat: Oropharynx is clear and moist. No oropharyngeal exudate.  Cardiovascular: Normal rate, regular rhythm and normal heart sounds. Exam reveals no gallop and no friction rub.  No murmur heard.  Pulmonary/Chest: Effort normal and breath sounds normal. No respiratory distress.  has no wheezes.  Abdominal: Soft. Bowel sounds are normal.  exhibits no distension. There is no tenderness. Low transverse surgical incision well  healed Lymphadenopathy: no cervical adenopathy.  Neurological: alert and oriented to person, place, and time.  Skin: Skin is warm and dry. No rash noted. No erythema.  Psychiatric: a normal mood and affect.  behavior is normal.   Lab Results  Component Value Date   CD4TCELL 28* 09/29/2014   Lab Results  Component Value Date   CD4TABS 950 09/29/2014   CD4TABS 870 04/02/2014   CD4TABS 890 01/01/2014   Lab Results  Component Value Date   HIV1RNAQUANT <20 09/29/2014   Lab Results  Component Value Date   HEPBSAB POS* 07/15/2013   No results found for: RPR  CBC Lab Results  Component Value Date   WBC 9.1 09/18/2014   RBC 4.25 09/18/2014   HGB 13.6 09/18/2014   HCT 39.2 09/18/2014   PLT 338 09/18/2014   MCV 92.2 09/18/2014   MCH 32.0 09/18/2014   MCHC 34.7 09/18/2014   RDW 12.8 09/18/2014   LYMPHSABS 3.1 09/18/2014   MONOABS 0.8 09/18/2014   EOSABS 0.1 09/18/2014   BASOSABS 0.0 09/18/2014   BMET Lab Results  Component Value Date   NA 137 09/18/2014   K 3.8 09/18/2014   CL 96 09/18/2014   CO2 30 09/18/2014   GLUCOSE 93 09/18/2014   BUN 11 09/18/2014   CREATININE 0.82 09/18/2014   CALCIUM 10.1 09/18/2014   GFRNONAA 88* 09/18/2014   GFRAA >90 09/18/2014     Assessment and Plan  Frequent urination = will check to see if she has uti, she states symptoms are similar to when she had dx of uti in past  Nausea= possibly related to uti. Will give rx for zofran  hiv = well controlled, continue with current regimen  rtc in 3 months

## 2014-10-15 ENCOUNTER — Encounter: Payer: Self-pay | Admitting: Obstetrics and Gynecology

## 2014-10-15 ENCOUNTER — Ambulatory Visit (INDEPENDENT_AMBULATORY_CARE_PROVIDER_SITE_OTHER): Payer: Medicaid Other | Admitting: Obstetrics and Gynecology

## 2014-10-15 VITALS — BP 120/66 | Ht 65.5 in | Wt 277.0 lb

## 2014-10-15 DIAGNOSIS — Z90710 Acquired absence of both cervix and uterus: Secondary | ICD-10-CM

## 2014-10-15 DIAGNOSIS — Z9889 Other specified postprocedural states: Secondary | ICD-10-CM

## 2014-10-15 DIAGNOSIS — Z9079 Acquired absence of other genital organ(s): Secondary | ICD-10-CM

## 2014-10-15 LAB — URINALYSIS, ROUTINE W REFLEX MICROSCOPIC
Bilirubin Urine: NEGATIVE
Glucose, UA: NEGATIVE mg/dL
HGB URINE DIPSTICK: NEGATIVE
KETONES UR: NEGATIVE mg/dL
Nitrite: NEGATIVE
Protein, ur: NEGATIVE mg/dL
SPECIFIC GRAVITY, URINE: 1.023 (ref 1.005–1.030)
UROBILINOGEN UA: 0.2 mg/dL (ref 0.0–1.0)
pH: 5.5 (ref 5.0–8.0)

## 2014-10-15 LAB — URINALYSIS, MICROSCOPIC ONLY
Casts: NONE SEEN
Crystals: NONE SEEN

## 2014-10-15 NOTE — Progress Notes (Signed)
Patient ID: Rachel Underwood, female   DOB: 10-17-1971, 43 y.o.   MRN: [id number] Pt here today for routine post op visit. Pt states that she has been having a discharge.

## 2014-10-15 NOTE — Progress Notes (Signed)
Patient ID: Runette Vantil, female   DOB: 12/14/1970, 43 y.o.   MRN: [id number] Subjective:     Yekaterina Degrasse is a 43 y.o. female who presents to the clinic 6 weeks status post total abdominal hysterectomy and wide excision of cicatrix, excision of nevus as well as salpingectomy. Path: Benign uterus, cervix, and tubes, "compound melanocytic nevus, benign with involvement of one edge   Diet: regular without difficulty. Bowel function is: normal. Pain:     The patient is not having any pain.  The following portions of the patient's history were reviewed and updated as appropriate: allergies, current medications, past family history, past medical history, past social history, past surgical history and problem list.  Review of Systems Pertinent items are noted in HPI.    Objective:    BP 120/66 mmHg  Ht 5' 5.5" (1.664 m)  Wt 277 lb (125.646 kg)  BMI 45.38 kg/m2  LMP 08/11/2014 General:  alert, cooperative, appears stated age and moderately obese, patient is very happy  Abdomen: soft, bowel sounds active, non-tender  Incision:   healing well, no drainage, no erythema, no hernia, no seroma, no swelling, no dehiscence, incision well approximated       Pelvic: VAGINA: well-healed vaginal cuff, mild stiffness and thickening still present                   ADNEXA: non-tender   Assessment:   Doing well postoperatively. Operative findings again reviewed. Pathology report discussed.   Plan:   1. Continue any current medications. 2. Wound care discussed. 3. Activity restrictions: none 4. Anticipated return to work: not applicable. 5. Follow up: 1 year for routine GYN exam.  This chart was scribed for Jonnie Kind, MD by Donato Schultz, ED Scribe. This patient was seen in Room 2 and the patient's care was started at 1:50 PM.

## 2014-10-16 LAB — CULTURE, URINE COMPREHENSIVE
Colony Count: NO GROWTH
Organism ID, Bacteria: NO GROWTH

## 2014-10-19 ENCOUNTER — Ambulatory Visit: Payer: Medicaid Other | Admitting: Gastroenterology

## 2014-11-02 ENCOUNTER — Ambulatory Visit: Payer: Medicaid Other | Admitting: Cardiovascular Disease

## 2014-11-10 ENCOUNTER — Ambulatory Visit: Payer: Medicaid Other | Admitting: Cardiovascular Disease

## 2014-11-11 ENCOUNTER — Telehealth: Payer: Self-pay | Admitting: *Deleted

## 2014-11-11 DIAGNOSIS — R110 Nausea: Secondary | ICD-10-CM

## 2014-11-11 MED ORDER — ONDANSETRON HCL 8 MG PO TABS: 8.0000 mg | ORAL_TABLET | Freq: Three times a day (TID) | ORAL | Status: DC | PRN

## 2014-11-11 NOTE — Telephone Encounter (Signed)
Pt requesting refill for Zofran due to continuing nausea.  Previously rxed Zofran by Dr. Baxter Flattery without refills.  Call to Dr. Baxter Flattery.  Dr. Baxter Flattery approved refill, #20 tablets with 2 refills.

## 2014-11-25 ENCOUNTER — Other Ambulatory Visit (HOSPITAL_COMMUNITY): Payer: Self-pay | Admitting: Internal Medicine

## 2014-11-25 DIAGNOSIS — Z1231 Encounter for screening mammogram for malignant neoplasm of breast: Secondary | ICD-10-CM

## 2014-12-08 ENCOUNTER — Ambulatory Visit: Payer: Medicaid Other | Admitting: Cardiovascular Disease

## 2014-12-11 ENCOUNTER — Other Ambulatory Visit: Payer: Self-pay

## 2014-12-11 ENCOUNTER — Ambulatory Visit (INDEPENDENT_AMBULATORY_CARE_PROVIDER_SITE_OTHER): Payer: Medicaid Other | Admitting: Gastroenterology

## 2014-12-11 ENCOUNTER — Encounter: Payer: Self-pay | Admitting: Gastroenterology

## 2014-12-11 VITALS — BP 131/87 | HR 54 | Temp 97.3°F | Ht 65.0 in | Wt 273.4 lb

## 2014-12-11 DIAGNOSIS — K219 Gastro-esophageal reflux disease without esophagitis: Secondary | ICD-10-CM

## 2014-12-11 DIAGNOSIS — R1314 Dysphagia, pharyngoesophageal phase: Secondary | ICD-10-CM

## 2014-12-11 DIAGNOSIS — K5900 Constipation, unspecified: Secondary | ICD-10-CM

## 2014-12-11 DIAGNOSIS — K2100 Gastro-esophageal reflux disease with esophagitis, without bleeding: Secondary | ICD-10-CM

## 2014-12-11 DIAGNOSIS — R1084 Generalized abdominal pain: Secondary | ICD-10-CM

## 2014-12-11 DIAGNOSIS — K5909 Other constipation: Secondary | ICD-10-CM

## 2014-12-11 DIAGNOSIS — R1310 Dysphagia, unspecified: Secondary | ICD-10-CM | POA: Insufficient documentation

## 2014-12-11 DIAGNOSIS — K625 Hemorrhage of anus and rectum: Secondary | ICD-10-CM

## 2014-12-11 DIAGNOSIS — R1319 Other dysphagia: Secondary | ICD-10-CM | POA: Insufficient documentation

## 2014-12-11 MED ORDER — PANTOPRAZOLE SODIUM 40 MG PO TBEC: 40.0000 mg | DELAYED_RELEASE_TABLET | Freq: Every day | ORAL | Status: DC

## 2014-12-11 MED ORDER — PEG-KCL-NACL-NASULF-NA ASC-C 100 G PO SOLR: 1.0000 | ORAL | Status: DC

## 2014-12-11 NOTE — Patient Instructions (Addendum)
1. Take Amitiza twice a day with a meal. If having bad diarrhea you may hold for a day. 2. Colonoscopy and upper endoscopy with Dr. Gala Romney. See separate instructions.  3. Stop omeprazole. Start pantoprazole once daily for GERD.

## 2014-12-11 NOTE — Progress Notes (Signed)
Primary Care Physician:  Rosita Fire, MD  Primary Gastroenterologist:  Garfield Cornea, MD   Chief Complaint  Patient presents with  . Irritable Bowel Syndrome    HPI:  Rachel Underwood is a 44 y.o. female here at the request of Dr. Lenard Simmer for further evaluation of change in bowel habits, questionable IBS.  Patient moved here from [place] about 2 years ago. She has HIV which is well managed with undetectable viral load. Chronic constipation all her life. Four weeks of worsening sytmpsom recently. She tried multiple over-the-counter agents without any relief. She saw Dr. Legrand Rams who put her on Amitiza 8 g twice a day. She states it took six days before she had a bowel movement and then she had several episodes of loose stool. She stopped the medication. Constipated again. Previously did not do well on MiraLAX.   Complains of twisting type abdominal pain. Occasional brbpr on tissue with hard stools. Remotely had a colonoscopy over 10 years ago. She has had a couple of EGDs the last 1 over 5 years ago. Has been on PPI therapy since that time. Previously did well on Nexium but currently on Prilosec. Takes it at bedtime because typically has nocturnal symptoms but now she is having breakthrough daytime symptoms. Nausea/vomiting from acid in day time. Food avoidance due to the symptoms.. States that she gained over 100 pounds related to antidepressant therapy with a maximum weight of 350 pounds several years ago. She reports weighing 300 pounds 2 years ago now down to 273. Complains of back issues, steroid injections yesterday. Gives a history of bleeding ulcers as a teenager.   Patient complains of dysphagia to solid foods. Complains of upper abdominal discomfort. Her pelvic pain resolved after recent hysterectomy.   Current Outpatient Prescriptions  Medication Sig Dispense Refill  . acyclovir (ZOVIRAX) 400 MG tablet Take 400 mg by mouth 2 (two) times daily as needed (for prophylaxis).    Marland Kitchen albuterol  (PROVENTIL HFA;VENTOLIN HFA) 108 (90 BASE) MCG/ACT inhaler Inhale 2 puffs into the lungs every 4 (four) hours as needed for wheezing or shortness of breath.    Marland Kitchen atenolol (TENORMIN) 100 MG tablet Take 1 tablet (100 mg total) by mouth daily. 30 tablet 6  . elvitegravir-cobicistat-emtricitabine-tenofovir (STRIBILD) 150-150-200-300 MG TABS tablet Take 1 tablet by mouth daily with breakfast. 30 tablet 11  . hydrochlorothiazide (HYDRODIURIL) 25 MG tablet Take 1 tablet (25 mg total) by mouth daily. 30 tablet 3  . hydrOXYzine (ATARAX/VISTARIL) 50 MG tablet Take 50 mg by mouth every 8 (eight) hours as needed.    . loratadine (CLARITIN) 10 MG tablet Take 10 mg by mouth daily.    Marland Kitchen omeprazole (PRILOSEC) 20 MG capsule Take 20 mg by mouth daily.    . ondansetron (ZOFRAN) 8 MG tablet Take 1 tablet (8 mg total) by mouth every 8 (eight) hours as needed for nausea or vomiting. 20 tablet 2  . OXcarbazepine (TRILEPTAL) 150 MG tablet Take 150 mg by mouth at bedtime.     . Pitavastatin Calcium 4 MG TABS Take 4 mg by mouth at bedtime. Pitavastatin 4mg  or placebo    . sertraline (ZOLOFT) 100 MG tablet Take 200 mg by mouth every morning. Takes 2 daily     No current facility-administered medications for this visit.    Allergies as of 12/11/2014 - Review Complete 12/11/2014  Allergen Reaction Noted  . Naproxen Anaphylaxis and Hives 06/03/2014  . Codeine Nausea And Vomiting 03/25/2014  . Gabapentin  05/13/2014  . Haldol [haloperidol lactate] Other (  See Comments) 03/25/2014  . Indomethacin Swelling 12/11/2014  . Invega [paliperidone] Other (See Comments) 06/10/2014  . Lithium Hives 06/03/2014  . Lexapro [escitalopram oxalate] Rash 07/15/2013    Past Medical History  Diagnosis Date  . Depression   . HIV (human immunodeficiency virus infection)   . Hypertension   . Asthma   . Borderline personality disorder   . Menorrhagia with irregular cycle 06/03/2014  . Dysmenorrhea 06/03/2014  . Enlarged uterus 06/03/2014  .  Herpes simplex without mention of complication   . Abnormal Pap smear of cervix 2005(EST)    ck[facility] for abnl pap- [facility] [place]  . GERD (gastroesophageal reflux disease)   . Seizures     had as a teenager, unknown etiology and no meds.  . Fibromyalgia   . Osteoarthritis     Past Surgical History  Procedure Laterality Date  . Ankle surgery Right     repair of ligaments  . Back surgery    . Shoulder surgery Right     rotator cuff  . Cesarean section    . Ckc    . Abdominal hysterectomy N/A 09/01/2014    Procedure: HYSTERECTOMY ABDOMINAL;  Surgeon: Jonnie Kind, MD;  Location: [facility];  Service: Gynecology;  Laterality: N/A;  . Scar revision N/A 09/01/2014    Procedure: EXCISION OF CICATRIX;  Surgeon: Jonnie Kind, MD;  Location: [facility];  Service: Gynecology;  Laterality: N/A;  . Bilateral salpingectomy Bilateral 09/01/2014    Procedure: BILATERAL SALPINGECTOMY;  Surgeon: Jonnie Kind, MD;  Location: [facility];  Service: Gynecology;  Laterality: Bilateral;  . Excision of skin tag N/A 09/01/2014    Procedure: EXCISION OF SKIN NEVUS;  Surgeon: Jonnie Kind, MD;  Location: [facility];  Service: Gynecology;  Laterality: N/A;    Family History  Problem Relation Age of Onset  . Fibroids Mother   . Hypertension Mother   . Depression Mother   . Hypertension Father   . Mental illness Father   . Heart disease Maternal Grandmother   . Cancer Maternal Grandmother     breast  . Bipolar disorder Daughter   . Schizophrenia Daughter   . Cancer Paternal Grandfather     prostate  . Heart disease Paternal Grandfather   . Dementia Paternal Grandmother   . Heart disease Maternal Grandfather   . Alzheimer's disease Maternal Grandfather   . Anxiety disorder Daughter   . ADD / ADHD Son   . Irritable bowel syndrome Mother   . Colon cancer Neg Hx     History   Social History  . Marital Status: Legally Separated    Spouse Name: N/A    Number of Children: 3  . Years of  Education: N/A   Occupational History  . Not on file.   Social History Main Topics  . Smoking status: Current Every Day Smoker -- 0.50 packs/day for 30 years    Types: Cigarettes  . Smokeless tobacco: Never Used  . Alcohol Use: No  . 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 Sexual Activity:    Partners: Male    Birth Control/ Protection: Condom   Other Topics Concern  . Not on file   Social History Narrative      ROS:  General: Negative for anorexia, weight loss, fever, chills, fatigue, weakness. Eyes: Negative for vision changes.  ENT: Negative for hoarseness, difficulty swallowing , nasal congestion. CV: Negative  for chest pain, angina, palpitations, dyspnea on exertion, peripheral edema.  Respiratory: Negative for dyspnea at rest, dyspnea on exertion, cough, sputum, wheezing.  GI: See history of present illness. GU:  Negative for dysuria, hematuria, urinary incontinence, urinary frequency, nocturnal urination.  MS: chronic joint pain, low back pain.  Derm: Negative for rash or itching.  Neuro: Negative for weakness, abnormal sensation, seizure, frequent headaches, memory loss, confusion.  Psych: H/O anxiety, depression currently controlled. No suicidal ideation, hallucinations.  Endo: Negative for unusual weight change.  Heme: Negative for bruising or bleeding. Allergy: Negative for rash or hives.    Physical Examination:  BP 131/87 mmHg  Pulse 54  Temp(Src) 97.3 F (36.3 C)  Ht 5\' 5"  (1.651 m)  Wt 273 lb 6.4 oz (124.013 kg)  BMI 45.50 kg/m2  LMP 08/11/2014   General: Well-nourished, well-developed in no acute distress. Obese. Head: Normocephalic, atraumatic.   Eyes: Conjunctiva pink, no icterus. Mouth: Oropharyngeal mucosa moist and pink , no lesions erythema or exudate. Neck: Supple without thyromegaly, masses, or lymphadenopathy.  Lungs: Clear to auscultation bilaterally.  Heart: Regular rate and rhythm, no  murmurs rubs or gallops.  Abdomen: Bowel sounds are normal, nontender, nondistended, no hepatosplenomegaly or masses, no abdominal bruits or    hernia , no rebound or guarding.   Rectal: not performed Extremities: No lower extremity edema. No clubbing or deformities.  Neuro: Alert and oriented x 4 , grossly normal neurologically.  Skin: Warm and dry, no rash or jaundice.   Psych: Alert and cooperative, normal mood and affect.  Labs: Lab Results  Component Value Date   WBC 9.1 09/18/2014   HGB 13.6 09/18/2014   HCT 39.2 09/18/2014   MCV 92.2 09/18/2014   PLT 338 09/18/2014   Lab Results  Component Value Date   CREATININE 0.82 09/18/2014   BUN 11 09/18/2014   NA 137 09/18/2014   K 3.8 09/18/2014   CL 96 09/18/2014   CO2 30 09/18/2014   Lab Results  Component Value Date   ALT 14 09/18/2014   AST 17 09/18/2014   ALKPHOS 84 09/18/2014   BILITOT 0.4 09/18/2014     Imaging Studies: No results found.

## 2014-12-11 NOTE — Assessment & Plan Note (Signed)
44 year old lady with chronic back issues/fibromyalgia who presents for further evaluation of generalized abdominal pain, predominantly upper abdomen in the setting of worsening constipation, refractory GERD. Recent trial with Amitiza, patient did not continue medication. After 6 day she did have a bowel movement but recalls his stools loose she stopped medication. She has intermittent bright red blood per rectum. Remote colonoscopy. Suspect IBS with constipation. However given recent significant change in bowel habits and blood in the stool we have offered her colonoscopy for further evaluation. All 3 conscious sedation with Phenergan 25 mg IV 30 minutes before given polypharmacy. I have discussed the risks, alternatives, benefits with regards to but not limited to the risk of reaction to medication, bleeding, infection, perforation and the patient is agreeable to proceed. Written consent to be obtained.   She also has chronic GERD with some refractory symptoms, esophageal dysphagia. She provides a history of bleeding ulcers as a teenager. Remote EGD. Offer EGD with dilation of the same time of colonoscopy. I have discussed the risks, alternatives, benefits with regards to but not limited to the risk of reaction to medication, bleeding, infection, perforation and the patient is agreeable to proceed. Written consent to be obtained.   She will start back on Amitiza 8 g twice a day. Advised the patient if she's not having regular bowel movements prior to her bowel prep she should let us know.  Switch her omeprazole to pantoprazole 40 mg daily.

## 2014-12-14 ENCOUNTER — Other Ambulatory Visit: Payer: Self-pay

## 2014-12-14 ENCOUNTER — Telehealth: Payer: Self-pay

## 2014-12-14 NOTE — Telephone Encounter (Signed)
Amitiza 79mcg BID with food as ordered.

## 2014-12-14 NOTE — Telephone Encounter (Signed)
Pharmacy is questioning Rachel Underwood order. They need verification for her dosage which is 1 capsule BID.

## 2014-12-15 NOTE — Progress Notes (Signed)
cc'ed to pcp °

## 2014-12-16 NOTE — Progress Notes (Signed)
The system is ahving an issue allowing me to do this refill. Can you call it in to the pharmacy for dispense 60 with 2 refills. Thanks

## 2014-12-16 NOTE — Progress Notes (Signed)
Called into pharmacy

## 2014-12-21 ENCOUNTER — Ambulatory Visit (HOSPITAL_COMMUNITY)
Admission: RE | Admit: 2014-12-21 | Discharge: 2014-12-21 | Disposition: A | Discharge status: Home / Self Care | Payer: Medicaid Other | Source: Ambulatory Visit | Attending: Internal Medicine | Admitting: Internal Medicine

## 2014-12-21 ENCOUNTER — Ambulatory Visit (INDEPENDENT_AMBULATORY_CARE_PROVIDER_SITE_OTHER): Payer: Medicaid Other | Admitting: Cardiovascular Disease

## 2014-12-21 ENCOUNTER — Encounter: Payer: Self-pay | Admitting: Cardiovascular Disease

## 2014-12-21 VITALS — BP 120/74 | HR 56 | Ht 65.5 in | Wt 273.8 lb

## 2014-12-21 DIAGNOSIS — Z1231 Encounter for screening mammogram for malignant neoplasm of breast: Secondary | ICD-10-CM

## 2014-12-21 DIAGNOSIS — IMO0001 Reserved for inherently not codable concepts without codable children: Secondary | ICD-10-CM

## 2014-12-21 DIAGNOSIS — I471 Supraventricular tachycardia: Secondary | ICD-10-CM

## 2014-12-21 DIAGNOSIS — R002 Palpitations: Secondary | ICD-10-CM

## 2014-12-21 DIAGNOSIS — R928 Other abnormal and inconclusive findings on diagnostic imaging of breast: Secondary | ICD-10-CM | POA: Insufficient documentation

## 2014-12-21 DIAGNOSIS — I10 Essential (primary) hypertension: Secondary | ICD-10-CM

## 2014-12-21 DIAGNOSIS — Z136 Encounter for screening for cardiovascular disorders: Secondary | ICD-10-CM

## 2014-12-21 NOTE — Progress Notes (Signed)
Patient ID: Rachel Underwood, female   DOB: 1971-02-10, 44 y.o.   MRN: [id number]      SUBJECTIVE: The patient returns for follow-up after undergoing cardiovascular testing performed for the evaluation of chest pain, exertional dyspnea, and palpitations. I saw her in June 2015 and she did not follow-up as scheduled 4-6 weeks thereafter. She has a history of hypertension, HIV, and bipolar disorder, and a strong family history of premature coronary artery disease. Echocardiogram demonstrated normal left ventricular systolic and diastolic function, EF 94-50%. Nuclear stress testing was normal with no evidence of myocardial ischemia or scar. Event monitoring demonstrated sinus rhythm and sinus tachycardia and a short run of SVT. The patient's symptoms correlated with sinus rhythm, sinus tachycardia, and PSVT. She takes atenolol.  She denies chest pain and shortness of breath. She does have intermittent palpitations. She denies syncope. She also has chronic headaches.    Review of Systems: As per "subjective", otherwise negative.  Allergies  Allergen Reactions  . Naproxen Anaphylaxis and Hives  . Codeine Nausea And Vomiting  . Gabapentin     Hives   . Haldol [Haloperidol Lactate] Other (See Comments)    Slurs speech and "stumble"  . Indomethacin Swelling    Dizzy, tigling of face  . Invega [Paliperidone] Other (See Comments)    Slurred speech, drooling  . Lithium Hives  . Trazodone And Nefazodone Other (See Comments)    Dry mouth, congestion, severe dryness.  [name] [Escitalopram Oxalate] Rash    Current Outpatient Prescriptions  Medication Sig Dispense Refill  . acyclovir (ZOVIRAX) 400 MG tablet Take 400 mg by mouth 2 (two) times daily as needed (for prophylaxis).    Marland Kitchen albuterol (PROVENTIL HFA;VENTOLIN HFA) 108 (90 BASE) MCG/ACT inhaler Inhale 2 puffs into the lungs every 4 (four) hours as needed for wheezing or shortness of breath.    Marland Kitchen atenolol (TENORMIN) 100 MG tablet Take 1 tablet  (100 mg total) by mouth daily. 30 tablet 6  . elvitegravir-cobicistat-emtricitabine-tenofovir (STRIBILD) 150-150-200-300 MG TABS tablet Take 1 tablet by mouth daily with breakfast. 30 tablet 11  . EPIPEN 2-PAK 0.3 MG/0.3ML SOAJ injection Inject 0.3 mLs as directed once.  0  . hydrochlorothiazide (HYDRODIURIL) 25 MG tablet Take 1 tablet (25 mg total) by mouth daily. 30 tablet 3  . hydrOXYzine (ATARAX/VISTARIL) 50 MG tablet Take 50 mg by mouth every 8 (eight) hours as needed for nausea or vomiting.     Marland Kitchen loratadine (CLARITIN) 10 MG tablet Take 10 mg by mouth daily.    Marland Kitchen lubiprostone (AMITIZA) 8 MCG capsule Take 1 capsule (8 mcg total) by mouth 2 (two) times daily with a meal.    . ondansetron (ZOFRAN) 8 MG tablet Take 1 tablet (8 mg total) by mouth every 8 (eight) hours as needed for nausea or vomiting. 20 tablet 2  . OXcarbazepine (TRILEPTAL) 150 MG tablet Take 300 mg by mouth at bedtime.     . pantoprazole (PROTONIX) 40 MG tablet Take 1 tablet (40 mg total) by mouth daily. 30 tablet 11  . peg 3350 powder (MOVIPREP) 100 G SOLR Take 1 kit (200 g total) by mouth as directed. 1 kit 0  . Pitavastatin Calcium 4 MG TABS Take 4 mg by mouth at bedtime. Pitavastatin 17m or placebo    . sertraline (ZOLOFT) 100 MG tablet Take 200 mg by mouth every morning. Takes 2 daily     No current facility-administered medications for this visit.    Past Medical History  Diagnosis Date  .  Depression   . HIV (human immunodeficiency virus infection)   . Hypertension   . Asthma   . Borderline personality disorder   . Menorrhagia with irregular cycle 06/03/2014  . Dysmenorrhea 06/03/2014  . Enlarged uterus 06/03/2014  . Herpes simplex without mention of complication   . Abnormal Pap smear of cervix 2005(EST)    [facility] for abnl pap- [facility] [place]  . GERD (gastroesophageal reflux disease)   . Seizures     had as a teenager, unknown etiology and no meds.  . Fibromyalgia   . Osteoarthritis     Past  Surgical History  Procedure Laterality Date  . Ankle surgery Right     repair of ligaments  . Back surgery    . Shoulder surgery Right     rotator cuff  . Cesarean section    . Ckc    . Abdominal hysterectomy N/A 09/01/2014    Procedure: HYSTERECTOMY ABDOMINAL;  Surgeon: Jonnie Kind, MD;  Location: [facility];  Service: Gynecology;  Laterality: N/A;  . Scar revision N/A 09/01/2014    Procedure: EXCISION OF CICATRIX;  Surgeon: Jonnie Kind, MD;  Location: [facility];  Service: Gynecology;  Laterality: N/A;  . Bilateral salpingectomy Bilateral 09/01/2014    Procedure: BILATERAL SALPINGECTOMY;  Surgeon: Jonnie Kind, MD;  Location: [facility];  Service: Gynecology;  Laterality: Bilateral;  . Excision of skin tag N/A 09/01/2014    Procedure: EXCISION OF SKIN NEVUS;  Surgeon: Jonnie Kind, MD;  Location: [facility];  Service: Gynecology;  Laterality: N/A;    History   Social History  . Marital Status: Divorced    Spouse Name: N/A    Number of Children: 3  . Years of Education: N/A   Occupational History  . Not on file.   Social History Main Topics  . Smoking status: Current Every Day Smoker -- 0.50 packs/day for 30 years    Types: Cigarettes    Start date: 12/04/1982  . Smokeless tobacco: Never Used  . Alcohol Use: No  . 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 Sexual Activity:    Partners: Male    Birth Control/ Protection: Condom   Other Topics Concern  . Not on file   Social History Narrative     Filed Vitals:   12/21/14 0910  BP: 120/74  Pulse: 56  Height: 5' 5.5" (1.664 m)  Weight: 273 lb 12.8 oz (124.195 kg)  SpO2: 97%    PHYSICAL EXAM General: NAD HEENT: Normal. Neck: No JVD, no thyromegaly. Lungs: Clear to auscultation bilaterally with normal respiratory effort. CV: Nondisplaced PMI.  Regular rate and rhythm, normal S1/S2, no S3/S4, no murmur. No pretibial or periankle edema.  No carotid bruit.  Normal pedal pulses.    Abdomen: Soft, nontender, no hepatosplenomegaly, no distention.  Neurologic: Alert and oriented x 3.  Psych: Normal affect. Skin: Normal. Musculoskeletal: Normal range of motion, no gross deformities. Extremities: No clubbing or cyanosis.   ECG: Most recent ECG reviewed.      ASSESSMENT AND PLAN: 1. Chest pain and shortness of breath: Stress test and echocardiogram results as noted above. No further testing is indicated at this time. 2. Palpitations/PSVT: Currently on atenolol with fairly good symptom control. No changes to therapy. 3. Essential HTN: Controlled on current dose of atenolol. No changes. Needs weight loss.  Dispo: f/u 1 year.   Kate Sable, M.D., F.A.C.C.

## 2014-12-21 NOTE — Patient Instructions (Signed)
Your physician wants you to follow-up in: 1 year with Dr Koneswaran You will receive a reminder letter in the mail two months in advance. If you don't receive a letter, please call our office to schedule the follow-up appointment.    Your physician recommends that you continue on your current medications as directed. Please refer to the Current Medication list given to you today.     Thank you for choosing [facility] !        

## 2014-12-23 ENCOUNTER — Other Ambulatory Visit: Payer: Self-pay | Admitting: Internal Medicine

## 2014-12-23 ENCOUNTER — Ambulatory Visit (HOSPITAL_COMMUNITY)
Admission: RE | Admit: 2014-12-23 | Discharge: 2014-12-23 | Disposition: A | Discharge status: Home / Self Care | Payer: Medicaid Other | Source: Ambulatory Visit | Attending: Internal Medicine | Admitting: Internal Medicine

## 2014-12-23 ENCOUNTER — Telehealth: Payer: Self-pay

## 2014-12-23 ENCOUNTER — Encounter (HOSPITAL_COMMUNITY): Payer: Self-pay | Admitting: *Deleted

## 2014-12-23 ENCOUNTER — Encounter (HOSPITAL_COMMUNITY)
Admission: RE | Disposition: A | Discharge status: Home / Self Care | Payer: Self-pay | Source: Ambulatory Visit | Attending: Internal Medicine

## 2014-12-23 DIAGNOSIS — K5900 Constipation, unspecified: Secondary | ICD-10-CM

## 2014-12-23 DIAGNOSIS — R1314 Dysphagia, pharyngoesophageal phase: Secondary | ICD-10-CM

## 2014-12-23 DIAGNOSIS — M1990 Unspecified osteoarthritis, unspecified site: Secondary | ICD-10-CM | POA: Diagnosis not present

## 2014-12-23 DIAGNOSIS — J45909 Unspecified asthma, uncomplicated: Secondary | ICD-10-CM | POA: Diagnosis not present

## 2014-12-23 DIAGNOSIS — K2100 Gastro-esophageal reflux disease with esophagitis, without bleeding: Secondary | ICD-10-CM

## 2014-12-23 DIAGNOSIS — Z8249 Family history of ischemic heart disease and other diseases of the circulatory system: Secondary | ICD-10-CM | POA: Insufficient documentation

## 2014-12-23 DIAGNOSIS — F17210 Nicotine dependence, cigarettes, uncomplicated: Secondary | ICD-10-CM | POA: Insufficient documentation

## 2014-12-23 DIAGNOSIS — M797 Fibromyalgia: Secondary | ICD-10-CM | POA: Insufficient documentation

## 2014-12-23 DIAGNOSIS — K921 Melena: Secondary | ICD-10-CM | POA: Insufficient documentation

## 2014-12-23 DIAGNOSIS — I10 Essential (primary) hypertension: Secondary | ICD-10-CM | POA: Insufficient documentation

## 2014-12-23 DIAGNOSIS — F329 Major depressive disorder, single episode, unspecified: Secondary | ICD-10-CM | POA: Insufficient documentation

## 2014-12-23 DIAGNOSIS — R1319 Other dysphagia: Secondary | ICD-10-CM | POA: Diagnosis present

## 2014-12-23 DIAGNOSIS — K648 Other hemorrhoids: Secondary | ICD-10-CM | POA: Diagnosis not present

## 2014-12-23 DIAGNOSIS — F603 Borderline personality disorder: Secondary | ICD-10-CM | POA: Diagnosis not present

## 2014-12-23 DIAGNOSIS — K5730 Diverticulosis of large intestine without perforation or abscess without bleeding: Secondary | ICD-10-CM | POA: Diagnosis not present

## 2014-12-23 DIAGNOSIS — R109 Unspecified abdominal pain: Secondary | ICD-10-CM | POA: Diagnosis present

## 2014-12-23 DIAGNOSIS — Z79899 Other long term (current) drug therapy: Secondary | ICD-10-CM | POA: Insufficient documentation

## 2014-12-23 DIAGNOSIS — R928 Other abnormal and inconclusive findings on diagnostic imaging of breast: Secondary | ICD-10-CM

## 2014-12-23 DIAGNOSIS — K625 Hemorrhage of anus and rectum: Secondary | ICD-10-CM

## 2014-12-23 DIAGNOSIS — K449 Diaphragmatic hernia without obstruction or gangrene: Secondary | ICD-10-CM | POA: Diagnosis not present

## 2014-12-23 DIAGNOSIS — Q394 Esophageal web: Secondary | ICD-10-CM

## 2014-12-23 DIAGNOSIS — K222 Esophageal obstruction: Secondary | ICD-10-CM | POA: Insufficient documentation

## 2014-12-23 DIAGNOSIS — R1310 Dysphagia, unspecified: Secondary | ICD-10-CM

## 2014-12-23 HISTORY — PX: MALONEY DILATION: SHX5535

## 2014-12-23 HISTORY — PX: COLONOSCOPY: SHX5424

## 2014-12-23 HISTORY — PX: ESOPHAGOGASTRODUODENOSCOPY: SHX5428

## 2014-12-23 HISTORY — PX: SAVORY DILATION: SHX5439

## 2014-12-23 SURGERY — COLONOSCOPY
Anesthesia: Moderate Sedation

## 2014-12-23 MED ORDER — ONDANSETRON HCL 4 MG/2ML IJ SOLN
INTRAMUSCULAR | Status: AC
  Filled 2014-12-23: qty 2

## 2014-12-23 MED ORDER — PROMETHAZINE HCL 25 MG/ML IJ SOLN
25.0000 mg | Freq: Once | INTRAMUSCULAR | Status: AC
  Administered 2014-12-23: 25 mg via INTRAVENOUS
  Filled 2014-12-23: qty 1

## 2014-12-23 MED ORDER — STERILE WATER FOR IRRIGATION IR SOLN
Status: DC | PRN
  Administered 2014-12-23: 14:00:00

## 2014-12-23 MED ORDER — MEPERIDINE HCL 100 MG/ML IJ SOLN
INTRAMUSCULAR | Status: DC | PRN
  Administered 2014-12-23 (×2): 25 mg via INTRAVENOUS
  Administered 2014-12-23: 50 mg via INTRAVENOUS
  Administered 2014-12-23: 25 mg via INTRAVENOUS

## 2014-12-23 MED ORDER — ONDANSETRON HCL 4 MG/2ML IJ SOLN
INTRAMUSCULAR | Status: DC | PRN
  Administered 2014-12-23: 4 mg via INTRAVENOUS

## 2014-12-23 MED ORDER — SODIUM CHLORIDE 0.9 % IJ SOLN
INTRAMUSCULAR | Status: AC
  Filled 2014-12-23: qty 3

## 2014-12-23 MED ORDER — MEPERIDINE HCL 100 MG/ML IJ SOLN
INTRAMUSCULAR | Status: AC
  Filled 2014-12-23: qty 2

## 2014-12-23 MED ORDER — MIDAZOLAM HCL 5 MG/5ML IJ SOLN
INTRAMUSCULAR | Status: AC
  Filled 2014-12-23: qty 10

## 2014-12-23 MED ORDER — LIDOCAINE VISCOUS 2 % MT SOLN
OROMUCOSAL | Status: DC | PRN
  Administered 2014-12-23: 3 mL via OROMUCOSAL

## 2014-12-23 MED ORDER — LIDOCAINE VISCOUS 2 % MT SOLN
OROMUCOSAL | Status: AC
  Filled 2014-12-23: qty 15

## 2014-12-23 MED ORDER — MIDAZOLAM HCL 5 MG/5ML IJ SOLN
INTRAMUSCULAR | Status: DC | PRN
  Administered 2014-12-23: 2 mg via INTRAVENOUS
  Administered 2014-12-23 (×5): 1 mg via INTRAVENOUS
  Administered 2014-12-23: 2 mg via INTRAVENOUS
  Administered 2014-12-23: 1 mg via INTRAVENOUS

## 2014-12-23 MED ORDER — SODIUM CHLORIDE 0.9 % IV SOLN
INTRAVENOUS | Status: DC
  Administered 2014-12-23: 11:00:00 via INTRAVENOUS

## 2014-12-23 NOTE — Interval H&P Note (Signed)
History and Physical Interval Note:  12/23/2014 1:51 PM  Rachel Underwood  has presented today for surgery, with the diagnosis of rectal bleeding/abd pain/dysphagia/constipation/GERD  The various methods of treatment have been discussed with the patient and family. After consideration of risks, benefits and other options for treatment, the patient has consented to  Procedure(s) with comments: COLONOSCOPY (N/A) - 1200 ESOPHAGOGASTRODUODENOSCOPY (EGD) (N/A) SAVORY DILATION (N/A) MALONEY DILATION (N/A) as a surgical intervention .  The patient's history has been reviewed, patient examined, no change in status, stable for surgery.  I have reviewed the patient's chart and labs.  Questions were answered to the patient's satisfaction.     Rachel Underwood  No change. EGD with possible EGD and colonoscopy per plan.

## 2014-12-23 NOTE — Interval H&P Note (Signed)
History and Physical Interval Note:  12/23/2014 1:51 PM  Rachel Underwood  has presented today for surgery, with the diagnosis of rectal bleeding/abd pain/dysphagia/constipation/GERD  The various methods of treatment have been discussed with the patient and family. After consideration of risks, benefits and other options for treatment, the patient has consented to  Procedure(s) with comments: COLONOSCOPY (N/A) - 1200 ESOPHAGOGASTRODUODENOSCOPY (EGD) (N/A) SAVORY DILATION (N/A) MALONEY DILATION (N/A) as a surgical intervention .  The patient's history has been reviewed, patient examined, no change in status, stable for surgery.  I have reviewed the patient's chart and labs.  Questions were answered to the patient's satisfaction.     Manus Rudd

## 2014-12-23 NOTE — Op Note (Signed)
[facility] [address] [place], [address]   COLONOSCOPY PROCEDURE REPORT  PATIENT: Rachel Underwood, Rachel Underwood  MR#: [id number] BIRTHDATE: Feb 03, 1971 , 43  yrs. old GENDER: female ENDOSCOPIST: R.  Garfield Cornea, MD FACP [facility] REFERRED OE:[id number] Legrand Rams, M.D. PROCEDURE DATE:  01-15-15 PROCEDURE:   Colonoscopy, diagnostic INDICATIONS:Paper hematochezia. MEDICATIONS: Versed 10 mg IV and Demerol 125 g IV in divided doses. Zofran 4 mg IV. ASA CLASS:       Class III  CONSENT: The risks, benefits, alternatives and imponderables including but not limited to bleeding, perforation as well as the possibility of a missed lesion have been reviewed.  The potential for biopsy, lesion removal, etc. have also been discussed. Questions have been answered.  All parties agreeable.  Please see the history and physical in the medical record for more information.  DESCRIPTION OF PROCEDURE:   After the risks benefits and alternatives of the procedure were thoroughly explained, informed consent was obtained.  The digital rectal exam revealed no abnormalities of the rectum.   The [id number] ([id number])  endoscope was introduced through the anus and advanced to the cecum, which was identified by both the appendix and ileocecal valve. No adverse events experienced.   The quality of the prep was adequate.  The instrument was then slowly withdrawn as the colon was fully examined.      COLON FINDINGS: Minimal canal hemorrhoids; otherwise, normal rectum. Few scattered left-sided diverticula; the remainder of the colonic mucosa appeared normal.  Retroflexion was performed. .  Withdrawal time=8 minutes 0 seconds.  The scope was withdrawn and the procedure completed. COMPLICATIONS: There were no immediate complications.  ENDOSCOPIC IMPRESSION: Anal canal hemorrhoids?"likely source of hematochezia. Few left-sided diverticula; otherwise, normal colonoscopy  RECOMMENDATIONS: Ten-day course of  Anusol suppositories 1 per rectum twice a day. Continue Amitiza 8 g twice daily. Add Benefiber 2 teaspoons twice daily to her regimen. Office visit with [place] in 6 weeks.  eSigned:  R. Garfield Cornea, MD Rosalita Chessman [facility] 01/15/15 2:59 PM   cc:  CPT CODES: ICD CODES:  The ICD and CPT codes recommended by this software are interpretations from the data that the clinical staff has captured with the software.  The verification of the translation of this report to the ICD and CPT codes and modifiers is the sole responsibility of the health care institution and practicing physician where this report was generated.  [employer] will not be held responsible for the validity of the ICD and CPT codes included on this report.  AMA assumes no liability for data contained or not contained herein. CPT is a Designer, television/film set of the [employer].

## 2014-12-23 NOTE — Discharge Instructions (Addendum)
GERD, hemorrhoids,  diverticulosis and constipation information provided Continue Protonix 40 mg daily Continue Amitiza 8 g twice daily Add Benefiber 2 teaspoons twice daily to the regimen Anusol HC suppositories as directed Further recommendations to follow up pathology report Office follow-up with [place] in 6 weeks  - Neil Crouch - March 2nd at 10:30 am  [phone]        Colonoscopy Discharge Instructions  Read the instructions outlined below and refer to this sheet in the next few weeks. These discharge instructions provide you with general information on caring for yourself after you leave the hospital. Your doctor may also give you specific instructions. While your treatment has been planned according to the most current medical practices available, unavoidable complications occasionally occur. If you have any problems or questions after discharge, call Dr. Gala Romney at [phone]. ACTIVITY  You may resume your regular activity, but move at a slower pace for the next 24 hours.   Take frequent rest periods for the next 24 hours.   Walking will help get rid of the air and reduce the bloated feeling in your belly (abdomen).   No driving for 24 hours (because of the medicine (anesthesia) used during the test).    Do not sign any important legal documents or operate any machinery for 24 hours (because of the anesthesia used during the test).  NUTRITION  Drink plenty of fluids.   You may resume your normal diet as instructed by your doctor.   Begin with a light meal and progress to your normal diet. Heavy or fried foods are harder to digest and may make you feel sick to your stomach (nauseated).   Avoid alcoholic beverages for 24 hours or as instructed.  MEDICATIONS  You may resume your normal medications unless your doctor tells you otherwise.  WHAT YOU CAN EXPECT TODAY  Some feelings of bloating in the abdomen.   Passage of more gas than usual.   Spotting of blood in your  stool or on the toilet paper.  IF YOU HAD POLYPS REMOVED DURING THE COLONOSCOPY:  No aspirin products for 7 days or as instructed.   No alcohol for 7 days or as instructed.   Eat a soft diet for the next 24 hours.  FINDING OUT THE RESULTS OF YOUR TEST Not all test results are available during your visit. If your test results are not back during the visit, make an appointment with your caregiver to find out the results. Do not assume everything is normal if you have not heard from your caregiver or the medical facility. It is important for you to follow up on all of your test results.  SEEK IMMEDIATE MEDICAL ATTENTION IF:  You have more than a spotting of blood in your stool.   Your belly is swollen (abdominal distention).   You are nauseated or vomiting.   You have a temperature over 101.  You have abdominal pain or discomfort that is severe or gets worse throughout the day. EGD Discharge instructions Please read the instructions outlined below and refer to this sheet in the next few weeks. These discharge instructions provide you with general information on caring for yourself after you leave the hospital. Your doctor may also give you specific instructions. While your treatment has been planned according to the most current medical practices available, unavoidable complications occasionally occur. If you have any problems or questions after discharge, please call your doctor. ACTIVITY You may resume your regular activity but move at a slower  pace for the next 24 hours.  Take frequent rest periods for the next 24 hours.  Walking will help expel (get rid of) the air and reduce the bloated feeling in your abdomen.  No driving for 24 hours (because of the anesthesia (medicine) used during the test).  You may shower.  Do not sign any important legal documents or operate any machinery for 24 hours (because of the anesthesia used during the test).  NUTRITION Drink plenty of fluids.  You may  resume your normal diet.  Begin with a light meal and progress to your normal diet.  Avoid alcoholic beverages for 24 hours or as instructed by your caregiver.  MEDICATIONS You may resume your normal medications unless your caregiver tells you otherwise.  WHAT YOU CAN EXPECT TODAY You may experience abdominal discomfort such as a feeling of fullness or gas pains.  FOLLOW-UP Your doctor will discuss the results of your test with you.  SEEK IMMEDIATE MEDICAL ATTENTION IF ANY OF THE FOLLOWING OCCUR: Excessive nausea (feeling sick to your stomach) and/or vomiting.  Severe abdominal pain and distention (swelling).  Trouble swallowing.  Temperature over 101 F (37.8 C).  Rectal bleeding or vomiting of blood.    Diverticulosis Diverticulosis is the condition that develops when small pouches (diverticula) form in the wall of your colon. Your colon, or large intestine, is where water is absorbed and stool is formed. The pouches form when the inside layer of your colon pushes through weak spots in the outer layers of your colon. CAUSES  No one knows exactly what causes diverticulosis. RISK FACTORS  Being older than 75. Your risk for this condition increases with age. Diverticulosis is rare in people younger than 40 years. By age 45, almost everyone has it.  Eating a low-fiber diet.  Being frequently constipated.  Being overweight.  Not getting enough exercise.  Smoking.  Taking over-the-counter pain medicines, like aspirin and ibuprofen. SYMPTOMS  Most people with diverticulosis do not have symptoms. DIAGNOSIS  Because diverticulosis often has no symptoms, health care providers often discover the condition during an exam for other colon problems. In many cases, a health care provider will diagnose diverticulosis while using a flexible scope to examine the colon (colonoscopy). TREATMENT  If you have never developed an infection related to diverticulosis, you may not need treatment.  If you have had an infection before, treatment may include:  Eating more fruits, vegetables, and grains.  Taking a fiber supplement.  Taking a live bacteria supplement (probiotic).  Taking medicine to relax your colon. HOME CARE INSTRUCTIONS   Drink at least 6-8 glasses of water each day to prevent constipation.  Try not to strain when you have a bowel movement.  Keep all follow-up appointments. If you have had an infection before:  Increase the fiber in your diet as directed by your health care provider or dietitian.  Take a dietary fiber supplement if your health care provider approves.  Only take medicines as directed by your health care provider. SEEK MEDICAL CARE IF:   You have abdominal pain.  You have bloating.  You have cramps.  You have not gone to the bathroom in 3 days. SEEK IMMEDIATE MEDICAL CARE IF:   Your pain gets worse.  Yourbloating becomes very bad.  You have a fever or chills, and your symptoms suddenly get worse.  You begin vomiting.  You have bowel movements that are bloody or black. MAKE SURE YOU:  Understand these instructions.  Will watch your condition.  Will get help right away if you are not doing well or get worse. Document Released: 08/17/2004 Document Revised: 11/25/2013 Document Reviewed: 10/15/2013 [facility] Patient Information 2015 [place], [place]. This information is not intended to replace advice given to you by your health care provider. Make sure you discuss any questions you have with your health care provider. Constipation Constipation is when a person has fewer than three bowel movements a week, has difficulty having a bowel movement, or has stools that are dry, hard, or larger than normal. As people grow older, constipation is more common. If you try to fix constipation with medicines that make you have a bowel movement (laxatives), the problem may get worse. Long-term laxative use may cause the muscles of the colon to  become weak. A low-fiber diet, not taking in enough fluids, and taking certain medicines may make constipation worse.  CAUSES   Certain medicines, such as antidepressants, pain medicine, iron supplements, antacids, and water pills.   Certain diseases, such as diabetes, irritable bowel syndrome (IBS), thyroid disease, or depression.   Not drinking enough water.   Not eating enough fiber-rich foods.   Stress or travel.   Lack of physical activity or exercise.   Ignoring the urge to have a bowel movement.   Using laxatives too much.  SIGNS AND SYMPTOMS   Having fewer than three bowel movements a week.   Straining to have a bowel movement.   Having stools that are hard, dry, or larger than normal.   Feeling full or bloated.   Pain in the lower abdomen.   Not feeling relief after having a bowel movement.  DIAGNOSIS  Your health care provider will take a medical history and perform a physical exam. Further testing may be done for severe constipation. Some tests may include:  A barium enema X-ray to examine your rectum, colon, and, sometimes, your small intestine.   A sigmoidoscopy to examine your lower colon.   A colonoscopy to examine your entire colon. TREATMENT  Treatment will depend on the severity of your constipation and what is causing it. Some dietary treatments include drinking more fluids and eating more fiber-rich foods. Lifestyle treatments may include regular exercise. If these diet and lifestyle recommendations do not help, your health care provider may recommend taking over-the-counter laxative medicines to help you have bowel movements. Prescription medicines may be prescribed if over-the-counter medicines do not work.  HOME CARE INSTRUCTIONS   Eat foods that have a lot of fiber, such as fruits, vegetables, whole grains, and beans.  Limit foods high in fat and processed sugars, such as french fries, hamburgers, cookies, candies, and soda.   A  fiber supplement may be added to your diet if you cannot get enough fiber from foods.   Drink enough fluids to keep your urine clear or pale yellow.   Exercise regularly or as directed by your health care provider.   Go to the restroom when you have the urge to go. Do not hold it.   Only take over-the-counter or prescription medicines as directed by your health care provider. Do not take other medicines for constipation without talking to your health care provider first.  [place] IF:   You have bright red blood in your stool.   Your constipation lasts for more than 4 days or gets worse.   You have abdominal or rectal pain.   You have thin, pencil-like stools.   You have unexplained weight loss. MAKE SURE YOU:   Understand  these instructions.  Will watch your condition.  Will get help right away if you are not doing well or get worse. Document Released: 08/18/2004 Document Revised: 11/25/2013 Document Reviewed: 09/01/2013 [facility] Patient Information 2015 [place], [place]. This information is not intended to replace advice given to you by your health care provider. Make sure you discuss any questions you have with your health care provider. Gastroesophageal Reflux Disease, Adult Gastroesophageal reflux disease (GERD) happens when acid from your stomach flows up into the esophagus. When acid comes in contact with the esophagus, the acid causes soreness (inflammation) in the esophagus. Over time, GERD may create small holes (ulcers) in the lining of the esophagus. CAUSES   Increased body weight. This puts pressure on the stomach, making acid rise from the stomach into the esophagus.  Smoking. This increases acid production in the stomach.  Drinking alcohol. This causes decreased pressure in the lower esophageal sphincter (valve or ring of muscle between the esophagus and stomach), allowing acid from the stomach into the esophagus.  Late evening meals  and a full stomach. This increases pressure and acid production in the stomach.  A malformed lower esophageal sphincter. Sometimes, no cause is found. SYMPTOMS   Burning pain in the lower part of the mid-chest behind the breastbone and in the mid-stomach area. This may occur twice a week or more often.  Trouble swallowing.  Sore throat.  Dry cough.  Asthma-like symptoms including chest tightness, shortness of breath, or wheezing. DIAGNOSIS  Your caregiver may be able to diagnose GERD based on your symptoms. In some cases, X-rays and other tests may be done to check for complications or to check the condition of your stomach and esophagus. TREATMENT  Your caregiver may recommend over-the-counter or prescription medicines to help decrease acid production. Ask your caregiver before starting or adding any new medicines.  HOME CARE INSTRUCTIONS   Change the factors that you can control. Ask your caregiver for guidance concerning weight loss, quitting smoking, and alcohol consumption.  Avoid foods and drinks that make your symptoms worse, such as:  Caffeine or alcoholic drinks.  Chocolate.  Peppermint or mint flavorings.  Garlic and onions.  Spicy foods.  Citrus fruits, such as oranges, lemons, or limes.  Tomato-based foods such as sauce, chili, salsa, and pizza.  Fried and fatty foods.  Avoid lying down for the 3 hours prior to your bedtime or prior to taking a nap.  Eat small, frequent meals instead of large meals.  Wear loose-fitting clothing. Do not wear anything tight around your waist that causes pressure on your stomach.  Raise the head of your bed 6 to 8 inches with wood blocks to help you sleep. Extra pillows will not help.  Only take over-the-counter or prescription medicines for pain, discomfort, or fever as directed by your caregiver.  Do not take aspirin, ibuprofen, or other nonsteroidal anti-inflammatory drugs (NSAIDs). SEEK IMMEDIATE MEDICAL CARE IF:    You have pain in your arms, neck, jaw, teeth, or back.  Your pain increases or changes in intensity or duration.  You develop nausea, vomiting, or sweating (diaphoresis).  You develop shortness of breath, or you faint.  Your vomit is green, yellow, black, or looks like coffee grounds or blood.  Your stool is red, bloody, or black. These symptoms could be signs of other problems, such as heart disease, gastric bleeding, or esophageal bleeding. MAKE SURE YOU:   Understand these instructions.  Will watch your condition.  Will get help right away if you  are not doing well or get worse. Document Released: 08/30/2005 Document Revised: 02/12/2012 Document Reviewed: 06/09/2011 [facility] Patient Information 2015 [place], [place]. This information is not intended to replace advice given to you by your health care provider. Make sure you discuss any questions you have with your health care provider.  Hemorrhoids Hemorrhoids are swollen veins around the rectum or anus. There are two types of hemorrhoids:   Internal hemorrhoids. These occur in the veins just inside the rectum. They may poke through to the outside and become irritated and painful.  External hemorrhoids. These occur in the veins outside the anus and can be felt as a painful swelling or hard lump near the anus. CAUSES  Pregnancy.   Obesity.   Constipation or diarrhea.   Straining to have a bowel movement.   Sitting for long periods on the toilet.  Heavy lifting or other activity that caused you to strain.  Anal intercourse. SYMPTOMS   Pain.   Anal itching or irritation.   Rectal bleeding.   Fecal leakage.   Anal swelling.   One or more lumps around the anus.  DIAGNOSIS  Your caregiver may be able to diagnose hemorrhoids by visual examination. Other examinations or tests that may be performed include:   Examination of the rectal area with a gloved hand (digital rectal exam).   Examination of  anal canal using a small tube (scope).   A blood test if you have lost a significant amount of blood.  A test to look inside the colon (sigmoidoscopy or colonoscopy). TREATMENT Most hemorrhoids can be treated at home. However, if symptoms do not seem to be getting better or if you have a lot of rectal bleeding, your caregiver may perform a procedure to help make the hemorrhoids get smaller or remove them completely. Possible treatments include:   Placing a rubber band at the base of the hemorrhoid to cut off the circulation (rubber band ligation).   Injecting a chemical to shrink the hemorrhoid (sclerotherapy).   Using a tool to burn the hemorrhoid (infrared light therapy).   Surgically removing the hemorrhoid (hemorrhoidectomy).   Stapling the hemorrhoid to block blood flow to the tissue (hemorrhoid stapling).  HOME CARE INSTRUCTIONS   Eat foods with fiber, such as whole grains, beans, nuts, fruits, and vegetables. Ask your doctor about taking products with added fiber in them (fibersupplements).  Increase fluid intake. Drink enough water and fluids to keep your urine clear or pale yellow.   Exercise regularly.   Go to the bathroom when you have the urge to have a bowel movement. Do not wait.   Avoid straining to have bowel movements.   Keep the anal area dry and clean. Use wet toilet paper or moist towelettes after a bowel movement.   Medicated creams and suppositories may be used or applied as directed.   Only take over-the-counter or prescription medicines as directed by your caregiver.   Take warm sitz baths for 15-20 minutes, 3-4 times a day to ease pain and discomfort.   Place ice packs on the hemorrhoids if they are tender and swollen. Using ice packs between sitz baths may be helpful.   Put ice in a plastic bag.   Place a towel between your skin and the bag.   Leave the ice on for 15-20 minutes, 3-4 times a day.   Do not use a donut-shaped  pillow or sit on the toilet for long periods. This increases blood pooling and pain.  [place]  IF:  You have increasing pain and swelling that is not controlled by treatment or medicine.  You have uncontrolled bleeding.  You have difficulty or you are unable to have a bowel movement.  You have pain or inflammation outside the area of the hemorrhoids. MAKE SURE YOU:  Understand these instructions.  Will watch your condition.  Will get help right away if you are not doing well or get worse. Document Released: 11/17/2000 Document Revised: 11/06/2012 Document Reviewed: 09/24/2012 [facility] Patient Information 2015 [place], [place]. This information is not intended to replace advice given to you by your health care provider. Make sure you discuss any questions you have with your health care provider.

## 2014-12-23 NOTE — Telephone Encounter (Signed)
Pt's insurance will bot cover the Anusol suppositories. They are wanting to know if they can change it to some kind of cream. Please advise

## 2014-12-23 NOTE — Op Note (Signed)
[facility] [address] [place], [address]   ENDOSCOPY PROCEDURE REPORT  PATIENT: Rachel, Underwood  MR#: [id number] BIRTHDATE: [birth year] , 43  yrs. old GENDER: female ENDOSCOPIST: R.  Garfield Cornea, MD FACP FACG REFERRED BY:  Conni Slipper, M.D. PROCEDURE DATE:  2015/01/05 PROCEDURE:  EGD w/ biopsy and Maloney dilation of esophagus INDICATIONS:  GERD; esophageal dysphagia; epigastric pain. MEDICATIONS: Versed 8 mg IV and Demerol 100 mg IV in divided doses. Zofran 4 mg IV.  Xylocaine gel orally ASA CLASS:      Class II  CONSENT: The risks, benefits, limitations, alternatives and imponderables have been discussed.  The potential for biopsy, esophogeal dilation, etc. have also been reviewed.  Questions have been answered.  All parties agreeable.  Please see the history and physical in the medical record for more information.  DESCRIPTION OF PROCEDURE: After the risks benefits and alternatives of the procedure were thoroughly explained, informed consent was obtained.  The EG-[id number] ([id number]) endoscope was introduced through the mouth and advanced to the second portion of the duodenum , limited by Without limitations. The instrument was slowly withdrawn as the mucosa was fully examined.    Single inverted "V" erosion at the GE junction.  Noncritical Schatzki's ring.  No Barrett's esophagus.  Stomach empty.  Mild reticulated pattern - gastric mucosa diffusely.  No ulcer or infiltrating process.  Very small hiatal hernia.  Patent pylorus. Normal first and second portion of the duodenum.  Scope removed.  Subsequent, a 36 French Maloney dilator was passed to full insertion easily.  A look back revealed no apparent complication related to this maneuver.  Finally, biopsies the abnormal gastric mucosa taken for histologic study.  Retroflexed views revealed as previously described.     The scope was then withdrawn from the patient and the procedure  completed.  COMPLICATIONS: There were no immediate complications.  ENDOSCOPIC IMPRESSION: Erosive reflux esophagitis.  Noncritical Shatzkis ring  -  status post Maloney dilation. Very small hiatal hernia. Subtly abnormal gastric mucosa of uncertain clinical significance?"status post biopsy  RECOMMENDATIONS: Follow-up on pathology. Continue Protonix 40 mg daily. See colonoscopy report.  REPEAT EXAM:  eSigned:  R. Garfield Cornea, MD Rosalita Chessman [facility] January 05, 2015 2:26 PM    CC:  CPT CODES: ICD CODES:  The ICD and CPT codes recommended by this software are interpretations from the data that the clinical staff has captured with the software.  The verification of the translation of this report to the ICD and CPT codes and modifiers is the sole responsibility of the health care institution and practicing physician where this report was generated.  [employer] will not be held responsible for the validity of the ICD and CPT codes included on this report.  AMA assumes no liability for data contained or not contained herein. CPT is a Designer, television/film set of the [employer].  PATIENT NAME:  Rachel, Underwood MR#: [id number]

## 2014-12-23 NOTE — H&P (View-Only) (Signed)
Primary Care Physician:  Rosita Fire, MD  Primary Gastroenterologist:  Garfield Cornea, MD   Chief Complaint  Patient presents with  . Irritable Bowel Syndrome    HPI:  Rachel Underwood is a 44 y.o. female here at the request of Dr. Lenard Simmer for further evaluation of change in bowel habits, questionable IBS.  Patient moved here from [place] about 2 years ago. She has HIV which is well managed with undetectable viral load. Chronic constipation all her life. Four weeks of worsening sytmpsom recently. She tried multiple over-the-counter agents without any relief. She saw Dr. Legrand Rams who put her on Amitiza 8 g twice a day. She states it took six days before she had a bowel movement and then she had several episodes of loose stool. She stopped the medication. Constipated again. Previously did not do well on MiraLAX.   Complains of twisting type abdominal pain. Occasional brbpr on tissue with hard stools. Remotely had a colonoscopy over 10 years ago. She has had a couple of EGDs the last 1 over 5 years ago. Has been on PPI therapy since that time. Previously did well on Nexium but currently on Prilosec. Takes it at bedtime because typically has nocturnal symptoms but now she is having breakthrough daytime symptoms. Nausea/vomiting from acid in day time. Food avoidance due to the symptoms.. States that she gained over 100 pounds related to antidepressant therapy with a maximum weight of 350 pounds several years ago. She reports weighing 300 pounds 2 years ago now down to 273. Complains of back issues, steroid injections yesterday. Gives a history of bleeding ulcers as a teenager.   Patient complains of dysphagia to solid foods. Complains of upper abdominal discomfort. Her pelvic pain resolved after recent hysterectomy.   Current Outpatient Prescriptions  Medication Sig Dispense Refill  . acyclovir (ZOVIRAX) 400 MG tablet Take 400 mg by mouth 2 (two) times daily as needed (for prophylaxis).    Marland Kitchen albuterol  (PROVENTIL HFA;VENTOLIN HFA) 108 (90 BASE) MCG/ACT inhaler Inhale 2 puffs into the lungs every 4 (four) hours as needed for wheezing or shortness of breath.    Marland Kitchen atenolol (TENORMIN) 100 MG tablet Take 1 tablet (100 mg total) by mouth daily. 30 tablet 6  . elvitegravir-cobicistat-emtricitabine-tenofovir (STRIBILD) 150-150-200-300 MG TABS tablet Take 1 tablet by mouth daily with breakfast. 30 tablet 11  . hydrochlorothiazide (HYDRODIURIL) 25 MG tablet Take 1 tablet (25 mg total) by mouth daily. 30 tablet 3  . hydrOXYzine (ATARAX/VISTARIL) 50 MG tablet Take 50 mg by mouth every 8 (eight) hours as needed.    . loratadine (CLARITIN) 10 MG tablet Take 10 mg by mouth daily.    Marland Kitchen omeprazole (PRILOSEC) 20 MG capsule Take 20 mg by mouth daily.    . ondansetron (ZOFRAN) 8 MG tablet Take 1 tablet (8 mg total) by mouth every 8 (eight) hours as needed for nausea or vomiting. 20 tablet 2  . OXcarbazepine (TRILEPTAL) 150 MG tablet Take 150 mg by mouth at bedtime.     . Pitavastatin Calcium 4 MG TABS Take 4 mg by mouth at bedtime. Pitavastatin 4mg  or placebo    . sertraline (ZOLOFT) 100 MG tablet Take 200 mg by mouth every morning. Takes 2 daily     No current facility-administered medications for this visit.    Allergies as of 12/11/2014 - Review Complete 12/11/2014  Allergen Reaction Noted  . Naproxen Anaphylaxis and Hives 06/03/2014  . Codeine Nausea And Vomiting 03/25/2014  . Gabapentin  05/13/2014  . Haldol [haloperidol lactate] Other (  See Comments) 03/25/2014  . Indomethacin Swelling 12/11/2014  . Invega [paliperidone] Other (See Comments) 06/10/2014  . Lithium Hives 06/03/2014  . Lexapro [escitalopram oxalate] Rash 07/15/2013    Past Medical History  Diagnosis Date  . Depression   . HIV (human immunodeficiency virus infection)   . Hypertension   . Asthma   . Borderline personality disorder   . Menorrhagia with irregular cycle 06/03/2014  . Dysmenorrhea 06/03/2014  . Enlarged uterus 06/03/2014  .  Herpes simplex without mention of complication   . Abnormal Pap smear of cervix 2005(EST)    ckc for abnl pap- [facility] [place]  . GERD (gastroesophageal reflux disease)   . Seizures     had as a teenager, unknown etiology and no meds.  . Fibromyalgia   . Osteoarthritis     Past Surgical History  Procedure Laterality Date  . Ankle surgery Right     repair of ligaments  . Back surgery    . Shoulder surgery Right     rotator cuff  . Cesarean section    . Ckc    . Abdominal hysterectomy N/A 09/01/2014    Procedure: HYSTERECTOMY ABDOMINAL;  Surgeon: Jonnie Kind, MD;  Location: [facility];  Service: Gynecology;  Laterality: N/A;  . Scar revision N/A 09/01/2014    Procedure: EXCISION OF CICATRIX;  Surgeon: Jonnie Kind, MD;  Location: [facility];  Service: Gynecology;  Laterality: N/A;  . Bilateral salpingectomy Bilateral 09/01/2014    Procedure: BILATERAL SALPINGECTOMY;  Surgeon: Jonnie Kind, MD;  Location: [facility];  Service: Gynecology;  Laterality: Bilateral;  . Excision of skin tag N/A 09/01/2014    Procedure: EXCISION OF SKIN NEVUS;  Surgeon: Jonnie Kind, MD;  Location: [facility];  Service: Gynecology;  Laterality: N/A;    Family History  Problem Relation Age of Onset  . Fibroids Mother   . Hypertension Mother   . Depression Mother   . Hypertension Father   . Mental illness Father   . Heart disease Maternal Grandmother   . Cancer Maternal Grandmother     breast  . Bipolar disorder Daughter   . Schizophrenia Daughter   . Cancer Paternal Grandfather     prostate  . Heart disease Paternal Grandfather   . Dementia Paternal Grandmother   . Heart disease Maternal Grandfather   . Alzheimer's disease Maternal Grandfather   . Anxiety disorder Daughter   . ADD / ADHD Son   . Irritable bowel syndrome Mother   . Colon cancer Neg Hx     History   Social History  . Marital Status: Legally Separated    Spouse Name: N/A    Number of Children: 3  . Years of  Education: N/A   Occupational History  . Not on file.   Social History Main Topics  . Smoking status: Current Every Day Smoker -- 0.50 packs/day for 30 years    Types: Cigarettes  . Smokeless tobacco: Never Used  . Alcohol Use: No  . 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 Sexual Activity:    Partners: Male    Birth Control/ Protection: Condom   Other Topics Concern  . Not on file   Social History Narrative      ROS:  General: Negative for anorexia, weight loss, fever, chills, fatigue, weakness. Eyes: Negative for vision changes.  ENT: Negative for hoarseness, difficulty swallowing , nasal congestion. CV: Negative  for chest pain, angina, palpitations, dyspnea on exertion, peripheral edema.  Respiratory: Negative for dyspnea at rest, dyspnea on exertion, cough, sputum, wheezing.  GI: See history of present illness. GU:  Negative for dysuria, hematuria, urinary incontinence, urinary frequency, nocturnal urination.  MS: chronic joint pain, low back pain.  Derm: Negative for rash or itching.  Neuro: Negative for weakness, abnormal sensation, seizure, frequent headaches, memory loss, confusion.  Psych: H/O anxiety, depression currently controlled. No suicidal ideation, hallucinations.  Endo: Negative for unusual weight change.  Heme: Negative for bruising or bleeding. Allergy: Negative for rash or hives.    Physical Examination:  BP 131/87 mmHg  Pulse 54  Temp(Src) 97.3 F (36.3 C)  Ht 5\' 5"  (1.651 m)  Wt 273 lb 6.4 oz (124.013 kg)  BMI 45.50 kg/m2  LMP 08/11/2014   General: Well-nourished, well-developed in no acute distress. Obese. Head: Normocephalic, atraumatic.   Eyes: Conjunctiva pink, no icterus. Mouth: Oropharyngeal mucosa moist and pink , no lesions erythema or exudate. Neck: Supple without thyromegaly, masses, or lymphadenopathy.  Lungs: Clear to auscultation bilaterally.  Heart: Regular rate and rhythm, no  murmurs rubs or gallops.  Abdomen: Bowel sounds are normal, nontender, nondistended, no hepatosplenomegaly or masses, no abdominal bruits or    hernia , no rebound or guarding.   Rectal: not performed Extremities: No lower extremity edema. No clubbing or deformities.  Neuro: Alert and oriented x 4 , grossly normal neurologically.  Skin: Warm and dry, no rash or jaundice.   Psych: Alert and cooperative, normal mood and affect.  Labs: Lab Results  Component Value Date   WBC 9.1 09/18/2014   HGB 13.6 09/18/2014   HCT 39.2 09/18/2014   MCV 92.2 09/18/2014   PLT 338 09/18/2014   Lab Results  Component Value Date   CREATININE 0.82 09/18/2014   BUN 11 09/18/2014   NA 137 09/18/2014   K 3.8 09/18/2014   CL 96 09/18/2014   CO2 30 09/18/2014   Lab Results  Component Value Date   ALT 14 09/18/2014   AST 17 09/18/2014   ALKPHOS 84 09/18/2014   BILITOT 0.4 09/18/2014     Imaging Studies: No results found.

## 2014-12-23 NOTE — H&P (View-Only) (Signed)
The system is ahving an issue allowing me to do this refill. Can you call it in to the pharmacy for dispense 60 with 2 refills. Thanks

## 2014-12-23 NOTE — Telephone Encounter (Signed)
They can substitute a generic hydrocortisone preparation for hemorrhoids-either cream or suppositories.

## 2014-12-24 ENCOUNTER — Ambulatory Visit: Payer: Medicaid Other | Admitting: Internal Medicine

## 2014-12-24 ENCOUNTER — Encounter (HOSPITAL_COMMUNITY): Payer: Self-pay | Admitting: Internal Medicine

## 2014-12-24 ENCOUNTER — Ambulatory Visit (INDEPENDENT_AMBULATORY_CARE_PROVIDER_SITE_OTHER): Payer: Self-pay | Admitting: *Deleted

## 2014-12-24 VITALS — BP 103/69 | HR 59 | Temp 98.4°F | Resp 16 | Wt 276.0 lb

## 2014-12-24 DIAGNOSIS — Z006 Encounter for examination for normal comparison and control in clinical research program: Secondary | ICD-10-CM

## 2014-12-24 LAB — COMPREHENSIVE METABOLIC PANEL
ALT: 10 U/L (ref 0–35)
AST: 12 U/L (ref 0–37)
Albumin: 4.1 g/dL (ref 3.5–5.2)
Alkaline Phosphatase: 63 U/L (ref 39–117)
BUN: 9 mg/dL (ref 6–23)
CALCIUM: 9.3 mg/dL (ref 8.4–10.5)
CHLORIDE: 104 meq/L (ref 96–112)
CO2: 27 mEq/L (ref 19–32)
CREATININE: 0.68 mg/dL (ref 0.50–1.10)
Glucose, Bld: 88 mg/dL (ref 70–99)
Potassium: 4.6 mEq/L (ref 3.5–5.3)
Sodium: 136 mEq/L (ref 135–145)
Total Bilirubin: 0.4 mg/dL (ref 0.2–1.2)
Total Protein: 6.7 g/dL (ref 6.0–8.3)

## 2014-12-24 LAB — LIPASE: LIPASE: 24 U/L (ref 0–75)

## 2014-12-24 LAB — LIPID PANEL
Cholesterol: 205 mg/dL — ABNORMAL HIGH (ref 0–200)
HDL: 39 mg/dL — ABNORMAL LOW (ref >39)
LDL Cholesterol: 138 mg/dL — ABNORMAL HIGH (ref 0–99)
TRIGLYCERIDES: 141 mg/dL (ref <150)
Total CHOL/HDL Ratio: 5.3 Ratio
VLDL: 28 mg/dL (ref 0–40)

## 2014-12-24 LAB — CBC WITH DIFFERENTIAL/PLATELET
BASOS PCT: 0 % (ref 0–1)
Basophils Absolute: 0 10*3/uL (ref 0.0–0.1)
Eosinophils Absolute: 0.1 10*3/uL (ref 0.0–0.7)
Eosinophils Relative: 1 % (ref 0–5)
HCT: 36.3 % (ref 36.0–46.0)
Hemoglobin: 12.8 g/dL (ref 12.0–15.0)
LYMPHS PCT: 35 % (ref 12–46)
Lymphs Abs: 2.5 10*3/uL (ref 0.7–4.0)
MCH: 32.4 pg (ref 26.0–34.0)
MCHC: 35.3 g/dL (ref 30.0–36.0)
MCV: 91.9 fL (ref 78.0–100.0)
MPV: 8.7 fL (ref 8.6–12.4)
Monocytes Absolute: 0.5 10*3/uL (ref 0.1–1.0)
Monocytes Relative: 7 % (ref 3–12)
Neutro Abs: 4 10*3/uL (ref 1.7–7.7)
Neutrophils Relative %: 57 % (ref 43–77)
Platelets: 178 10*3/uL (ref 150–400)
RBC: 3.95 MIL/uL (ref 3.87–5.11)
RDW: 14.1 % (ref 11.5–15.5)
WBC: 7.1 10*3/uL (ref 4.0–10.5)

## 2014-12-24 LAB — HEMOGLOBIN A1C
Hgb A1c MFr Bld: 5.4 % (ref <5.7)
MEAN PLASMA GLUCOSE: 108 mg/dL (ref <117)

## 2014-12-24 LAB — HEPATITIS C ANTIBODY: HCV Ab: NEGATIVE

## 2014-12-24 LAB — HEPATITIS B CORE ANTIBODY, TOTAL: Hep B Core Total Ab: NONREACTIVE

## 2014-12-24 LAB — HEPATITIS B SURFACE ANTIGEN: HEP B S AG: NEGATIVE

## 2014-12-24 LAB — BILIRUBIN,DIRECT & INDIRECT (FRACTIONATED)
BILIRUBIN DIRECT: 0.1 mg/dL (ref 0.0–0.3)
Indirect Bilirubin: 0.3 mg/dL (ref 0.2–1.2)

## 2014-12-24 NOTE — Telephone Encounter (Signed)
Beth at [facility] is aware

## 2014-12-24 NOTE — Progress Notes (Signed)
   Subjective:    Patient ID: Rachel Underwood, female    DOB: 01-18-1971, 44 y.o.   MRN: [id number]  HPI    Review of Systems  Constitutional: Negative.   HENT: Negative.   Eyes: Positive for visual disturbance.  Respiratory: Negative.   Cardiovascular: Negative.   Gastrointestinal: Positive for abdominal pain, constipation and blood in stool.  Genitourinary: Negative.   Musculoskeletal: Positive for back pain.  Skin: Negative.   Neurological: Negative.   Psychiatric/Behavioral: Negative.        Objective:   Physical Exam  Constitutional: She is oriented to person, place, and time. She appears well-developed and well-nourished.  HENT:  Mouth/Throat: Oropharynx is clear and moist.  Eyes: No scleral icterus.  Cardiovascular: Normal rate, regular rhythm, normal heart sounds and intact distal pulses.   Pulmonary/Chest: Effort normal and breath sounds normal.  Abdominal: Soft. Bowel sounds are normal.  Musculoskeletal: Normal range of motion. She exhibits edema.  Lymphadenopathy:    She has no cervical adenopathy.  Neurological: She is alert and oriented to person, place, and time.  Skin: Skin is warm and dry.  Psychiatric: She has a normal mood and affect.          Assessment & Plan:  [name] is here for her month 8 Reprieve study visit. She brought her meds back for pill count and she has been very adherent with taking them. Denies any current muscle aches or muscle weakness. She had an endoscopy and colonoscopy yesterday for chronic constipation and to have her esophagus stretched. She was put on amitiza a few weeks ago to help with constipation. Her exam revealed some hemmorhoids and some gastric erosion from GERD. She is interested in participating in the isotretinoin study, so went on and screened her today. Permission was granted from both study teams for coenrollment on both the Reprieve and [id number] studies. Informed consent was obtained after reviewing the consent together and  discussion about what the study entails. We did discuss her past psych history, which may keep her out of the study.

## 2014-12-28 ENCOUNTER — Encounter: Payer: Self-pay | Admitting: Internal Medicine

## 2014-12-28 LAB — HIV-1 RNA QUANT-NO REFLEX-BLD
HIV 1 RNA Quant: <20 copies/mL (ref <20)
HIV-1 RNA Quant, Log: <1.3 {Log} (ref <1.30)

## 2015-01-05 ENCOUNTER — Ambulatory Visit (HOSPITAL_COMMUNITY)
Admission: RE | Admit: 2015-01-05 | Discharge: 2015-01-05 | Disposition: A | Discharge status: Home / Self Care | Payer: Medicaid Other | Source: Ambulatory Visit | Attending: Internal Medicine | Admitting: Internal Medicine

## 2015-01-05 DIAGNOSIS — R928 Other abnormal and inconclusive findings on diagnostic imaging of breast: Secondary | ICD-10-CM | POA: Insufficient documentation

## 2015-01-06 ENCOUNTER — Other Ambulatory Visit: Payer: Self-pay | Admitting: *Deleted

## 2015-01-06 DIAGNOSIS — B009 Herpesviral infection, unspecified: Secondary | ICD-10-CM

## 2015-01-06 MED ORDER — ACYCLOVIR 400 MG PO TABS: 400.0000 mg | ORAL_TABLET | Freq: Two times a day (BID) | ORAL | Status: DC

## 2015-01-15 ENCOUNTER — Telehealth: Payer: Self-pay | Admitting: Internal Medicine

## 2015-01-15 NOTE — Telephone Encounter (Signed)
Pt said she has a knot on the right side near her belly button and it gets swollen and sore occasionally. She is aware of OV on 3/2 but has concerns about what could be causing this. Please advise. [id number] or [id number]

## 2015-01-15 NOTE — Telephone Encounter (Signed)
Spoke with pt- she said this has been going on for over 10 years. She said she saw a GI in [place] 10 years ago and he told her it wasn't a hernia, he felt like it was scar tissue from getting her tubes tied. She said it comes and goes, especially if she has to strain a lot. It is on the Right side of her navel and the area gets inflamed, swollen, and is sensitive to touch and lasts several weeks when it happens. It was not there when she saw [facility] in January. She sometimes has nausea when it happens but no blood in her stool and no vomiting. This issue is going on now and pt is not sure what is causing it, she is afraid it wont be there when she comes back in for her follow up appt.

## 2015-01-17 NOTE — Telephone Encounter (Signed)
Can offer her urgent OV for evaluation.

## 2015-01-19 NOTE — Telephone Encounter (Signed)
Pt called back and said the knot was gone now and she didn't need ov any sooner that she would just want until March 2nd

## 2015-01-19 NOTE — Telephone Encounter (Signed)
I tried calling patient and had to leave message for her to call me back regarding changing her appointment. She is scheduled for 3/2 at 1030 w/[facility] and I was going to offer her URG spot with [facility] on 2/18 at 1130

## 2015-01-19 NOTE — Telephone Encounter (Signed)
Rachel Underwood, please offer pt urgent appt.

## 2015-01-21 ENCOUNTER — Telehealth: Payer: Self-pay | Admitting: Cardiovascular Disease

## 2015-01-21 ENCOUNTER — Ambulatory Visit: Payer: Medicaid Other | Admitting: Gastroenterology

## 2015-01-21 NOTE — Telephone Encounter (Signed)
No need to change atenolol dose. She is abusing caffeine which will lead to tachy-palpitations. She should reduce consumption considerably.

## 2015-01-21 NOTE — Telephone Encounter (Signed)
Has had "12 episodes" of PVC's since office visit.Does not record heart rate.On a "good day" she drinks 6 cups of coffee.If she wakes at night she may drink 2 pots of coffee.i explained that caffeine in large amounts like she is drinking may cause increased sx's of palpitations. She states she will try decaf.Her Atenolol is 100 mg daily,do you want to make any changes?     Will route to Dr Bronson Ing

## 2015-01-21 NOTE — Telephone Encounter (Signed)
Please call patient back regarding symptoms she is still having with racing heart. / tgs

## 2015-01-21 NOTE — Telephone Encounter (Signed)
Pt made aware

## 2015-01-25 ENCOUNTER — Other Ambulatory Visit: Payer: Self-pay | Admitting: *Deleted

## 2015-01-25 DIAGNOSIS — B20 Human immunodeficiency virus [HIV] disease: Secondary | ICD-10-CM

## 2015-01-25 MED ORDER — ELVITEG-COBIC-EMTRICIT-TENOFDF 150-150-200-300 MG PO TABS: 1.0000 | ORAL_TABLET | Freq: Every day | ORAL | Status: DC

## 2015-01-28 ENCOUNTER — Ambulatory Visit: Payer: Medicaid Other | Admitting: Internal Medicine

## 2015-02-03 ENCOUNTER — Ambulatory Visit (INDEPENDENT_AMBULATORY_CARE_PROVIDER_SITE_OTHER): Payer: Medicaid Other | Admitting: Gastroenterology

## 2015-02-03 ENCOUNTER — Encounter: Payer: Self-pay | Admitting: Gastroenterology

## 2015-02-03 VITALS — BP 110/71 | HR 67 | Temp 97.0°F | Ht 64.0 in | Wt 281.4 lb

## 2015-02-03 DIAGNOSIS — K5900 Constipation, unspecified: Secondary | ICD-10-CM

## 2015-02-03 DIAGNOSIS — K439 Ventral hernia without obstruction or gangrene: Secondary | ICD-10-CM | POA: Insufficient documentation

## 2015-02-03 DIAGNOSIS — K2100 Gastro-esophageal reflux disease with esophagitis, without bleeding: Secondary | ICD-10-CM

## 2015-02-03 DIAGNOSIS — R1033 Periumbilical pain: Secondary | ICD-10-CM | POA: Insufficient documentation

## 2015-02-03 DIAGNOSIS — R1905 Periumbilic swelling, mass or lump: Secondary | ICD-10-CM | POA: Insufficient documentation

## 2015-02-03 NOTE — Progress Notes (Signed)
Primary Care Physician: Rosita Fire, MD  Primary Gastroenterologist:  Garfield Cornea, MD   Chief Complaint  Patient presents with  . Follow-up    HPI: Rachel Underwood is a 44 y.o. female here follow-up. EGD back in January for GERD, esophageal dysphagia, epigastric pain showed erosive reflux esophagitis, noncritical Schatzki ring status post dilation, very small hiatal hernia. Subtle abnormal gastric mucosa showed mild chronic inactive gastritis with negative H pylori. Colonoscopy at the same time for hematochezia showed anal canal hemorrhoids, few left-sided diverticula.  Overall is feeling better. Bowel movements once per day on Amitiza. Anusol suppositories and hydrocortisone cream for hemorrhoids, improved. Dysphagia better. Heartburn controlled on Protonix. Main concern today is regarding a chronic, intermittent issue where she develops a "knot" to the right of her umbilicus associated with coughing, straining. Develops pain which lasts for several days at a time. May be symptomatic only 6-7 times per year. "Knot" may be the size of a plum at times. Denies any previous abdominal imaging.    Current Outpatient Prescriptions  Medication Sig Dispense Refill  . acyclovir (ZOVIRAX) 400 MG tablet Take 400 mg by mouth 2 (two) times daily as needed (for prophylaxis).    Marland Kitchen acyclovir (ZOVIRAX) 400 MG tablet Take 1 tablet (400 mg total) by mouth 2 (two) times daily. For prophylaxis. 60 tablet 5  . albuterol (PROVENTIL HFA;VENTOLIN HFA) 108 (90 BASE) MCG/ACT inhaler Inhale 2 puffs into the lungs every 4 (four) hours as needed for wheezing or shortness of breath.    Marland Kitchen atenolol (TENORMIN) 100 MG tablet Take 1 tablet (100 mg total) by mouth daily. 30 tablet 6  . cetirizine (ZYRTEC) 10 MG tablet Take 10 mg by mouth daily.    Marland Kitchen elvitegravir-cobicistat-emtricitabine-tenofovir (STRIBILD) 150-150-200-300 MG TABS tablet Take 1 tablet by mouth daily with breakfast. 30 tablet 11  . EPIPEN 2-PAK 0.3  MG/0.3ML SOAJ injection Inject 0.3 mLs as directed once.  0  . hydrochlorothiazide (HYDRODIURIL) 25 MG tablet Take 1 tablet (25 mg total) by mouth daily. 30 tablet 3  . hydrOXYzine (ATARAX/VISTARIL) 50 MG tablet Take 50 mg by mouth every 8 (eight) hours as needed for nausea or vomiting.     . lubiprostone (AMITIZA) 8 MCG capsule Take 1 capsule (8 mcg total) by mouth 2 (two) times daily with a meal.    . ondansetron (ZOFRAN) 8 MG tablet Take 1 tablet (8 mg total) by mouth every 8 (eight) hours as needed for nausea or vomiting. 20 tablet 2  . OXcarbazepine (TRILEPTAL) 150 MG tablet Take 300 mg by mouth at bedtime.     . pantoprazole (PROTONIX) 40 MG tablet Take 1 tablet (40 mg total) by mouth daily. 30 tablet 11  . Pitavastatin Calcium 4 MG TABS Take 4 mg by mouth at bedtime. Pitavastatin 4mg  or placebo    . sertraline (ZOLOFT) 100 MG tablet Take 200 mg by mouth every morning. Takes 2 daily     No current facility-administered medications for this visit.    Allergies as of 02/03/2015 - Review Complete 02/03/2015  Allergen Reaction Noted  . Naproxen Anaphylaxis and Hives 06/03/2014  . Codeine Nausea And Vomiting 03/25/2014  . Gabapentin  05/13/2014  . Haldol [haloperidol lactate] Other (See Comments) 03/25/2014  . Indomethacin Swelling 12/11/2014  . Invega [paliperidone] Other (See Comments) 06/10/2014  . Lithium Hives 06/03/2014  . Promethazine Swelling 02/03/2015  . Trazodone and nefazodone Other (See Comments) 12/18/2014  . Lexapro [escitalopram oxalate] Rash 07/15/2013    ROS:  General:  Negative for anorexia, weight loss, fever, chills, fatigue, weakness. ENT: Negative for hoarseness, difficulty swallowing , nasal congestion. CV: Negative for chest pain, angina, palpitations, dyspnea on exertion, peripheral edema.  Respiratory: Negative for dyspnea at rest, dyspnea on exertion, cough, sputum, wheezing.  GI: See history of present illness. GU:  Negative for dysuria, hematuria,  urinary incontinence, urinary frequency, nocturnal urination.  Endo: Negative for unusual weight change.    Physical Examination:   BP 110/71 mmHg  Pulse 67  Temp(Src) 97 F (36.1 C)  Ht 5\' 4"  (1.626 m)  Wt 281 lb 6.4 oz (127.642 kg)  BMI 48.28 kg/m2  LMP 08/11/2014  General: Well-nourished, well-developed in no acute distress.  Eyes: No icterus. Mouth: Oropharyngeal mucosa moist and pink , no lesions erythema or exudate. Lungs: Clear to auscultation bilaterally.  Heart: Regular rate and rhythm, no murmurs rubs or gallops.  Abdomen: Bowel sounds are normal, abdomen is obese. She has mild to moderate tenderness location right of umbilicus and including umbilicus. With coughing there is a weakness right of umbilicus and involving right side of umbilicus associated with tenderness. More pronounced with standing. Mass-like structure noted. No hepatosplenomegaly, no abdominal bruits, no rebound or guarding.   Extremities: No lower extremity edema. No clubbing or deformities. Neuro: Alert and oriented x 4   Skin: Warm and dry, no jaundice.   Psych: Alert and cooperative, normal mood and affect.  Labs:  Lab Results  Component Value Date   WBC 7.1 12/24/2014   HGB 12.8 12/24/2014   HCT 36.3 12/24/2014   MCV 91.9 12/24/2014   PLT 178 12/24/2014   Lab Results  Component Value Date   CREATININE 0.68 12/24/2014   BUN 9 12/24/2014   NA 136 12/24/2014   K 4.6 12/24/2014   CL 104 12/24/2014   CO2 27 12/24/2014   Lab Results  Component Value Date   LIPASE 24 12/24/2014   Lab Results  Component Value Date   ALT 10 12/24/2014   AST 12 12/24/2014   ALKPHOS 63 12/24/2014   BILITOT 0.4 12/24/2014    Imaging Studies: Mm Digital Diagnostic Unilat L  01/05/2015   CLINICAL DATA:  Screening recall for possible distortion in the central upper left breast.  EXAM: DIGITAL DIAGNOSTIC LEFT MAMMOGRAM  COMPARISON:  Screening mammograms dated 12/15/2013 and 12/21/2014.  ACR Breast Density  Category b: There are scattered areas of fibroglandular density.  FINDINGS: The initially questioned possible distortion seen in the superior left breast mid to posterior depth resolves on the additional spot compression CC and MLO views compatible with overlapping tissue. There is no mammographic evidence of malignancy in the left breast.  IMPRESSION: Initially questioned possible left breast distortion resolves on the additional imaging compatible with superimposed tissue. There is no mammographic evidence of malignancy in the left breast.  RECOMMENDATION: Screening mammogram in one year.(Code:SM-B-01Y)  I have discussed the findings and recommendations with the patient. Results were also provided in writing at the conclusion of the visit. If applicable, a reminder letter will be sent to the patient regarding the next appointment.  BI-RADS CATEGORY  1: Negative.   Electronically Signed   By: Everlean Alstrom M.D.   On: 01/05/2015 09:36

## 2015-02-03 NOTE — Assessment & Plan Note (Signed)
Erosive reflux esophagitis, doing better. She is on pantoprazole 40 mg daily. Discussed antireflux measures. Dysphagia improved status post dilation of noncritical Schatzki ring.

## 2015-02-03 NOTE — Progress Notes (Signed)
cc'ed to pcp °

## 2015-02-03 NOTE — Patient Instructions (Addendum)
1. CT scan as discussed. We will call you within 5-7 business days of the study with results.  2. Continue Amitiza and pantoprazole.

## 2015-02-03 NOTE — Assessment & Plan Note (Signed)
Doing well on Amitiza 8 g twice a day for now.

## 2015-02-03 NOTE — Assessment & Plan Note (Signed)
Involving right side of umbilicus, noted more with standing, masslike effect likely abdominal wall hernia. Body habitus limits exam somewhat. Associated with abdominal pain. Given her history of recurrent bulging in this area suspect she does have a hernia. She has never had abdominal imaging. CT abdomen/pelvis with contrast. Likely surgical referral based on findings. She prefers [facility] surgery.

## 2015-02-09 ENCOUNTER — Encounter (HOSPITAL_COMMUNITY)
Admission: RE | Admit: 2015-02-09 | Discharge: 2015-02-09 | Disposition: A | Discharge status: Home / Self Care | Payer: Medicaid Other | Source: Ambulatory Visit | Attending: Internal Medicine | Admitting: Internal Medicine

## 2015-02-09 DIAGNOSIS — Z029 Encounter for administrative examinations, unspecified: Secondary | ICD-10-CM | POA: Insufficient documentation

## 2015-02-09 LAB — TSH: TSH: 0.489 u[IU]/mL (ref 0.350–4.500)

## 2015-02-09 LAB — CBC WITH DIFFERENTIAL/PLATELET
Basophils Absolute: 0 10*3/uL (ref 0.0–0.1)
Basophils Relative: 0 % (ref 0–1)
EOS ABS: 0.1 10*3/uL (ref 0.0–0.7)
EOS PCT: 1 % (ref 0–5)
HCT: 33.5 % — ABNORMAL LOW (ref 36.0–46.0)
HEMOGLOBIN: 11.5 g/dL — AB (ref 12.0–15.0)
LYMPHS ABS: 2.1 10*3/uL (ref 0.7–4.0)
Lymphocytes Relative: 31 % (ref 12–46)
MCH: 33 pg (ref 26.0–34.0)
MCHC: 34.3 g/dL (ref 30.0–36.0)
MCV: 96.3 fL (ref 78.0–100.0)
Monocytes Absolute: 0.5 10*3/uL (ref 0.1–1.0)
Monocytes Relative: 8 % (ref 3–12)
Neutro Abs: 4 10*3/uL (ref 1.7–7.7)
Neutrophils Relative %: 60 % (ref 43–77)
Platelets: 200 10*3/uL (ref 150–400)
RBC: 3.48 MIL/uL — AB (ref 3.87–5.11)
RDW: 13.2 % (ref 11.5–15.5)
WBC: 6.7 10*3/uL (ref 4.0–10.5)

## 2015-02-09 LAB — LIPID PANEL
CHOL/HDL RATIO: 4.5 ratio
Cholesterol: 207 mg/dL — ABNORMAL HIGH (ref 0–200)
HDL: 46 mg/dL (ref >39)
LDL CALC: 127 mg/dL — AB (ref 0–99)
Triglycerides: 169 mg/dL — ABNORMAL HIGH (ref <150)
VLDL: 34 mg/dL (ref 0–40)

## 2015-02-09 LAB — COMPREHENSIVE METABOLIC PANEL
ALK PHOS: 56 U/L (ref 39–117)
ALT: 19 U/L (ref 0–35)
ANION GAP: 6 (ref 5–15)
AST: 21 U/L (ref 0–37)
Albumin: 3.4 g/dL — ABNORMAL LOW (ref 3.5–5.2)
BUN: 11 mg/dL (ref 6–23)
CHLORIDE: 102 mmol/L (ref 96–112)
CO2: 27 mmol/L (ref 19–32)
Calcium: 8.8 mg/dL (ref 8.4–10.5)
Creatinine, Ser: 0.61 mg/dL (ref 0.50–1.10)
Glucose, Bld: 99 mg/dL (ref 70–99)
POTASSIUM: 4.2 mmol/L (ref 3.5–5.1)
SODIUM: 135 mmol/L (ref 135–145)
TOTAL PROTEIN: 6.6 g/dL (ref 6.0–8.3)
Total Bilirubin: 0.4 mg/dL (ref 0.3–1.2)

## 2015-02-10 ENCOUNTER — Ambulatory Visit (HOSPITAL_COMMUNITY)
Admission: RE | Admit: 2015-02-10 | Discharge: 2015-02-10 | Disposition: A | Discharge status: Home / Self Care | Payer: Medicaid Other | Source: Ambulatory Visit | Attending: Gastroenterology | Admitting: Gastroenterology

## 2015-02-10 DIAGNOSIS — R1033 Periumbilical pain: Secondary | ICD-10-CM | POA: Insufficient documentation

## 2015-02-10 DIAGNOSIS — K5900 Constipation, unspecified: Secondary | ICD-10-CM

## 2015-02-10 DIAGNOSIS — K439 Ventral hernia without obstruction or gangrene: Secondary | ICD-10-CM

## 2015-02-10 DIAGNOSIS — R1905 Periumbilic swelling, mass or lump: Secondary | ICD-10-CM | POA: Insufficient documentation

## 2015-02-10 DIAGNOSIS — K2100 Gastro-esophageal reflux disease with esophagitis, without bleeding: Secondary | ICD-10-CM

## 2015-02-10 MED ORDER — IOHEXOL 300 MG/ML  SOLN
100.0000 mL | Freq: Once | INTRAMUSCULAR | Status: AC | PRN
  Administered 2015-02-10: 100 mL via INTRAVENOUS

## 2015-02-11 ENCOUNTER — Other Ambulatory Visit: Payer: Self-pay | Admitting: Obstetrics and Gynecology

## 2015-02-11 NOTE — Progress Notes (Signed)
Quick Note:  Please let patient know she does have a hernia which explains the mass and pain to the right of her umbilicus. Contains mesenteric fat but no bowel currently.   I would offer her a referral to [facility]. ______

## 2015-02-15 ENCOUNTER — Other Ambulatory Visit: Payer: Self-pay

## 2015-02-15 DIAGNOSIS — K429 Umbilical hernia without obstruction or gangrene: Secondary | ICD-10-CM

## 2015-03-04 ENCOUNTER — Encounter: Payer: Self-pay | Admitting: Internal Medicine

## 2015-03-04 ENCOUNTER — Ambulatory Visit (INDEPENDENT_AMBULATORY_CARE_PROVIDER_SITE_OTHER): Payer: Medicaid Other | Admitting: Internal Medicine

## 2015-03-04 VITALS — BP 115/79 | HR 62 | Temp 98.8°F | Wt 287.0 lb

## 2015-03-04 DIAGNOSIS — B20 Human immunodeficiency virus [HIV] disease: Secondary | ICD-10-CM | POA: Diagnosis not present

## 2015-03-04 DIAGNOSIS — B351 Tinea unguium: Secondary | ICD-10-CM | POA: Diagnosis not present

## 2015-03-04 MED ORDER — RALTEGRAVIR POTASSIUM 400 MG PO TABS: 400.0000 mg | ORAL_TABLET | Freq: Two times a day (BID) | ORAL | Status: DC

## 2015-03-04 MED ORDER — TERBINAFINE HCL 250 MG PO TABS: 250.0000 mg | ORAL_TABLET | Freq: Every day | ORAL | Status: DC

## 2015-03-04 MED ORDER — EMTRICITABINE-TENOFOVIR DF 200-300 MG PO TABS: 1.0000 | ORAL_TABLET | Freq: Every day | ORAL | Status: DC

## 2015-03-04 NOTE — Progress Notes (Signed)
Patient ID: Rachel Underwood, female   DOB: [birth year], 44 y.o.   MRN: [id number]       Patient ID: Rachel Underwood, female   DOB: 1971-07-07, 44 y.o.   MRN: [id number]  HPI Barney is a 44yo F with HIV disease-schizophrenia. She has seen a few providers since we last saw her. She was seen by GI for schatzki's ring and abd pain. Found to have abd hernia for which she is being referred to general surgery. She had sleep study that ruled out OSA. She is worried about weight gain from psych meds and impact on her back pain. She is interested in finding out about lap band surgery  Ros =occ aphous ulcer , and onychomycosis to great toenail left foot x 1 yr  Outpatient Encounter Prescriptions as of 03/04/2015  Medication Sig  . acyclovir (ZOVIRAX) 400 MG tablet Take 400 mg by mouth 2 (two) times daily as needed (for prophylaxis).  Marland Kitchen acyclovir (ZOVIRAX) 400 MG tablet Take 1 tablet (400 mg total) by mouth 2 (two) times daily. For prophylaxis.  Marland Kitchen albuterol (PROVENTIL HFA;VENTOLIN HFA) 108 (90 BASE) MCG/ACT inhaler Inhale 2 puffs into the lungs every 4 (four) hours as needed for wheezing or shortness of breath.  Marland Kitchen atenolol (TENORMIN) 100 MG tablet Take 1 tablet (100 mg total) by mouth daily.  . cetirizine (ZYRTEC) 10 MG tablet Take 10 mg by mouth daily.  Marland Kitchen elvitegravir-cobicistat-emtricitabine-tenofovir (STRIBILD) 150-150-200-300 MG TABS tablet Take 1 tablet by mouth daily with breakfast.  . EPIPEN 2-PAK 0.3 MG/0.3ML SOAJ injection Inject 0.3 mLs as directed once.  . hydrochlorothiazide (HYDRODIURIL) 25 MG tablet TAKE ONE TABLET BY MOUTH DAILY  . hydrOXYzine (ATARAX/VISTARIL) 50 MG tablet Take 50 mg by mouth every 8 (eight) hours as needed for nausea or vomiting.   . lubiprostone (AMITIZA) 8 MCG capsule Take 1 capsule (8 mcg total) by mouth 2 (two) times daily with a meal.  . ondansetron (ZOFRAN) 8 MG tablet Take 1 tablet (8 mg total) by mouth every 8 (eight) hours as needed for nausea or vomiting.  . OXcarbazepine  (TRILEPTAL) 150 MG tablet Take 300 mg by mouth at bedtime.   . pantoprazole (PROTONIX) 40 MG tablet Take 1 tablet (40 mg total) by mouth daily.  . Pitavastatin Calcium 4 MG TABS Take 4 mg by mouth at bedtime. Pitavastatin 4mg  or placebo  . sertraline (ZOLOFT) 100 MG tablet Take 200 mg by mouth every morning. Takes 2 daily     Patient Active Problem List   Diagnosis Date Noted  . Abdominal wall hernia 02/03/2015  . Periumbilical abdominal pain 02/03/2015  . Abdominal wall mass of periumbilical region 75/17/0017  . Schatzki's ring   . Diverticulosis of colon without hemorrhage   . Other hemorrhoids   . Rectal bleeding 12/11/2014  . GERD (gastroesophageal reflux disease) 12/11/2014  . Esophageal dysphagia 12/11/2014  . Constipation 12/11/2014  . Abdominal pain, generalized 12/11/2014  . S/P hysterectomy 09/01/2014  . Pelvic pain in female 07/22/2014  . Anovulation 06/18/2014  . DUB (dysfunctional uterine bleeding) 06/18/2014  . Abdominal wall pain 06/18/2014  . Menorrhagia with irregular cycle 06/03/2014  . Enlarged uterus 06/03/2014  . HTN (hypertension) 01/15/2014  . Influenza with other respiratory manifestations 01/07/2014  . Human immunodeficiency virus (HIV) disease 07/17/2013  . Schizophrenia 07/17/2013  . Bipolar disorder, unspecified 07/17/2013     There are no preventive care reminders to display for this patient.   Review of Systems 10 point ros reviewed, positive pertinents listed above in  hpi Physical Exam   BP 115/79 mmHg  Pulse 62  Temp(Src) 98.8 F (37.1 C) (Oral)  Wt 287 lb (130.182 kg)  LMP 08/11/2014 Physical Exam  Constitutional:  oriented to person, place, and time. appears well-developed and well-nourished. No distress.  HENT:  Mouth/Throat: Oropharynx is clear and moist. No oropharyngeal exudate.  Cardiovascular: Normal rate, regular rhythm and normal heart sounds. Exam reveals no gallop and no friction rub.  No murmur heard.  Pulmonary/Chest:  Effort normal and breath sounds normal. No respiratory distress.  has no wheezes.  Abdominal: Soft. Bowel sounds are normal.  exhibits no distension. There is no tenderness.  Lymphadenopathy: no cervical adenopathy.  Neurological: alert and oriented to person, place, and time.  Skin: Skin is warm and dry. No rash noted. No erythema.  Psychiatric: a normal mood and affect. behavior is normal.   Lab Results  Component Value Date   CD4TCELL 28* 09/29/2014   Lab Results  Component Value Date   CD4TABS 950 09/29/2014   CD4TABS 870 04/02/2014   CD4TABS 890 01/01/2014   Lab Results  Component Value Date   HIV1RNAQUANT <20 12/24/2014   Lab Results  Component Value Date   HEPBSAB POS* 07/15/2013   No results found for: RPR  CBC Lab Results  Component Value Date   WBC 6.7 02/09/2015   RBC 3.48* 02/09/2015   HGB 11.5* 02/09/2015   HCT 33.5* 02/09/2015   PLT 200 02/09/2015   MCV 96.3 02/09/2015   MCH 33.0 02/09/2015   MCHC 34.3 02/09/2015   RDW 13.2 02/09/2015   LYMPHSABS 2.1 02/09/2015   MONOABS 0.5 02/09/2015   EOSABS 0.1 02/09/2015   BASOSABS 0.0 02/09/2015   BMET Lab Results  Component Value Date   NA 135 02/09/2015   K 4.2 02/09/2015   CL 102 02/09/2015   CO2 27 02/09/2015   GLUCOSE 99 02/09/2015   BUN 11 02/09/2015   CREATININE 0.61 02/09/2015   CALCIUM 8.8 02/09/2015   GFRNONAA >90 02/09/2015   GFRAA >90 02/09/2015     Assessment and Plan  hiv disease = well controlled hiv disease. However there is drug interaction with trileptal. We will change her to rlg BID plus truvada for now.  Onychomycosis = terbinafine 250mg  x 12 wk since it involves her toenail.  Schizophrenia = will need to continue to update med list to ensure we have her correct medications and ensure that they do not interact with her hiv meds  aphous ulcer = non present. rtc for evaluation if they persist

## 2015-04-16 ENCOUNTER — Telehealth: Payer: Self-pay | Admitting: Cardiovascular Disease

## 2015-04-16 ENCOUNTER — Telehealth: Payer: Self-pay | Admitting: Obstetrics and Gynecology

## 2015-04-16 ENCOUNTER — Telehealth: Payer: Self-pay

## 2015-04-16 NOTE — Telephone Encounter (Signed)
Patient had GI procedure done and had questions . I asked her to call her gastroenterologist, she agrees

## 2015-04-16 NOTE — Telephone Encounter (Signed)
Pt states that she had a CT scan done recently and she is concerned with the results. The Ct scan showed that she has cysts. Pt states that she is in pain all the time so she does NOT know if they are causing her pain or not. I advised the pt that she would need to make an appointment and discuss this with Dr. Glo Herring. Phone call was switched up front and an appointment was made.

## 2015-04-16 NOTE — Telephone Encounter (Signed)
Pt called to see if someone could explain her CT scan to her. She is aware of the results but she has questions about some of the finding on the CT. She would like to talk with LSL. Her call back number is [phone]. Please advise

## 2015-04-16 NOTE — Telephone Encounter (Signed)
Would like to have a better explanation of the testing she had done recently

## 2015-04-19 NOTE — Telephone Encounter (Signed)
Rachel Underwood please find out more specifics. She has placed call to Dr. Glo Herring regarding adnexal cysts on CT and has made an appointment there.

## 2015-04-20 NOTE — Telephone Encounter (Signed)
Spoke with the pt. She was concerned about the coronary artery calcifications. She wants to know what she needs to do about that.

## 2015-04-21 NOTE — Telephone Encounter (Signed)
Let's send her for cardiology consultation for borderline cardiomegaly and coronary artery calcifications seen on CT.

## 2015-04-21 NOTE — Telephone Encounter (Signed)
I spoke with the pt, she said she already sees Dr.Koneswaran at [facility] and had called their office last week. I spoke with Neil Crouch PA-C and she asked that I forward a note to [place] for his opinion about the test.   Dr.Koneswaran, this is a mutual patient of ours that recently had a CT and it showed borderline cardiomegaly and coronary artery calcifications. Will you look at the test results and give [place] your opinion or do you need to see her back in your office?   Thank you for your time,  Burnadette Peter LPN

## 2015-04-22 ENCOUNTER — Encounter (INDEPENDENT_AMBULATORY_CARE_PROVIDER_SITE_OTHER): Payer: Medicaid Other | Admitting: *Deleted

## 2015-04-22 VITALS — BP 117/80 | HR 60 | Temp 98.4°F | Resp 16 | Wt 287.8 lb

## 2015-04-22 DIAGNOSIS — Z006 Encounter for examination for normal comparison and control in clinical research program: Secondary | ICD-10-CM

## 2015-04-22 DIAGNOSIS — Z21 Asymptomatic human immunodeficiency virus [HIV] infection status: Secondary | ICD-10-CM

## 2015-04-22 LAB — COMPREHENSIVE METABOLIC PANEL
ALBUMIN: 4 g/dL (ref 3.5–5.2)
ALT: 25 U/L (ref 0–35)
AST: 20 U/L (ref 0–37)
Alkaline Phosphatase: 70 U/L (ref 39–117)
BUN: 10 mg/dL (ref 6–23)
CALCIUM: 9.1 mg/dL (ref 8.4–10.5)
CHLORIDE: 102 meq/L (ref 96–112)
CO2: 26 meq/L (ref 19–32)
Creat: 0.69 mg/dL (ref 0.50–1.10)
Glucose, Bld: 92 mg/dL (ref 70–99)
Potassium: 4.2 mEq/L (ref 3.5–5.3)
Sodium: 136 mEq/L (ref 135–145)
Total Bilirubin: 0.3 mg/dL (ref 0.2–1.2)
Total Protein: 6.8 g/dL (ref 6.0–8.3)

## 2015-04-22 NOTE — Progress Notes (Signed)
Rachel Underwood is here for her month 69 Reprieve study visit. She had an inguinal hernia repair 3 days ago as an outpatient at the Surgical center and is still sore from that. She said that she also had Left rotator cuff repair on 02/05/15 as an outpatient. She has also had her ARVS changed to [place] and [place] by Dr. Baxter Flattery. She will return in 4 months for the next study visit.

## 2015-04-22 NOTE — Telephone Encounter (Signed)
Routing to LSL 

## 2015-04-22 NOTE — Telephone Encounter (Signed)
Please let patient know that Dr. Bronson Ing is aware of CT findings regarding coronary artery calcifications, borderline cardiomegaly. See his input below regarding ECHO and nuclear stress test results from previous evaluation.   No need for further evaluation by cardiology regarding the CT findings.

## 2015-04-22 NOTE — Addendum Note (Signed)
Addended by: Dolan Amen D on: 04/22/2015 03:41 PM   Modules accepted: Orders

## 2015-04-22 NOTE — Telephone Encounter (Signed)
Tried to call pt- NA- LMOM 

## 2015-04-22 NOTE — Telephone Encounter (Signed)
Given normal LV size seen by echocardiogram in 05/2014 and normal nuclear stress test, there would be no need for her to see me regarding the CT findings.  Dr. Bronson Ing

## 2015-04-23 LAB — T-HELPER CELL (CD4) - (RCID CLINIC ONLY)
CD4 T CELL HELPER: 32 % — AB (ref 33–55)
CD4 T Cell Abs: 670 /uL (ref 400–2700)

## 2015-04-23 LAB — HIV-1 RNA QUANT-NO REFLEX-BLD: HIV 1 RNA Quant: <20 copies/mL (ref <20)

## 2015-04-23 NOTE — Telephone Encounter (Signed)
Pt is aware.  

## 2015-05-04 ENCOUNTER — Other Ambulatory Visit (HOSPITAL_COMMUNITY): Payer: Self-pay | Admitting: Neurosurgery

## 2015-05-04 DIAGNOSIS — M5440 Lumbago with sciatica, unspecified side: Secondary | ICD-10-CM

## 2015-05-04 DIAGNOSIS — Z981 Arthrodesis status: Secondary | ICD-10-CM

## 2015-05-06 ENCOUNTER — Ambulatory Visit (HOSPITAL_COMMUNITY)
Admission: RE | Admit: 2015-05-06 | Discharge: 2015-05-06 | Disposition: A | Discharge status: Home / Self Care | Payer: Medicaid Other | Source: Ambulatory Visit | Attending: Neurosurgery | Admitting: Neurosurgery

## 2015-05-06 DIAGNOSIS — Z981 Arthrodesis status: Secondary | ICD-10-CM

## 2015-05-06 DIAGNOSIS — M5440 Lumbago with sciatica, unspecified side: Secondary | ICD-10-CM

## 2015-05-10 ENCOUNTER — Telehealth: Payer: Self-pay | Admitting: Obstetrics and Gynecology

## 2015-05-10 NOTE — Telephone Encounter (Signed)
Pt states had a CT scan done that showed cyst on her ovary. Pt informed will need an appt to discuss with Dr. Glo Herring. Pt states has an appt for this Wednesday, May 12, 2015.

## 2015-05-11 ENCOUNTER — Telehealth: Payer: Self-pay

## 2015-05-11 ENCOUNTER — Other Ambulatory Visit: Payer: Self-pay | Admitting: Gastroenterology

## 2015-05-11 ENCOUNTER — Telehealth: Payer: Self-pay | Admitting: Gastroenterology

## 2015-05-11 DIAGNOSIS — R197 Diarrhea, unspecified: Secondary | ICD-10-CM

## 2015-05-11 MED ORDER — ONDANSETRON HCL 4 MG PO TABS: 4.0000 mg | ORAL_TABLET | ORAL | Status: DC | PRN

## 2015-05-11 NOTE — Telephone Encounter (Signed)
Hold Amitiza. Stool studies: Orders placed.  OV this week. Zofran sent to pharmacy.

## 2015-05-11 NOTE — Telephone Encounter (Addendum)
Ginger, was patient not able to come this week?

## 2015-05-11 NOTE — Telephone Encounter (Signed)
error 

## 2015-05-11 NOTE — Telephone Encounter (Signed)
Noted  

## 2015-05-11 NOTE — Telephone Encounter (Signed)
Pt is holding the Amitiza. She will come by to pick up the containers. She has an appointment in 05/19/15 @ 11:30 with LSL

## 2015-05-11 NOTE — Telephone Encounter (Signed)
Stool containers and orders placed at front for the pt.

## 2015-05-11 NOTE — Telephone Encounter (Signed)
No she was not able to get in this week due to other appointments

## 2015-05-11 NOTE — Telephone Encounter (Signed)
Pt is calling today because she is having the diarrhea. She has been having it for 5 days now. She is going 5-8 times a day. No matter what she eats or drinks it goes through her. She is has nausea also. She is wanting to know what she can do. Her call back number is [phone]. Please advise

## 2015-05-11 NOTE — Telephone Encounter (Signed)
OK. Thanks.

## 2015-05-12 ENCOUNTER — Encounter: Payer: Self-pay | Admitting: Obstetrics and Gynecology

## 2015-05-12 ENCOUNTER — Ambulatory Visit (INDEPENDENT_AMBULATORY_CARE_PROVIDER_SITE_OTHER): Payer: Medicaid Other | Admitting: Obstetrics and Gynecology

## 2015-05-12 VITALS — BP 122/72 | HR 60 | Wt 286.0 lb

## 2015-05-12 DIAGNOSIS — N83202 Unspecified ovarian cyst, left side: Principal | ICD-10-CM

## 2015-05-12 DIAGNOSIS — N8320 Unspecified ovarian cysts: Secondary | ICD-10-CM | POA: Diagnosis not present

## 2015-05-12 DIAGNOSIS — N83201 Unspecified ovarian cyst, right side: Secondary | ICD-10-CM | POA: Insufficient documentation

## 2015-05-12 LAB — GIARDIA/CRYPTOSPORIDIUM (EIA)
Cryptosporidium Screen (EIA): NEGATIVE
Giardia Screen (EIA): NEGATIVE

## 2015-05-12 LAB — CLOSTRIDIUM DIFFICILE BY PCR: CDIFFPCR: NOT DETECTED

## 2015-05-12 NOTE — Progress Notes (Signed)
Patient ID: Rachel Underwood, female   DOB: 1971/06/03, 44 y.o.   MRN: [id number]    [facility] Visit  Patient name: Rachel Underwood MRN [id number]  Date of birth: 1971-10-23  CC & HPI:  Rachel Underwood is a 44 y.o. female presenting today complaining of a concern of adnexal cysts measuring 3.7 cm on the left and 3.2 cm on the right. Patient is also S/P a laparoscopic hernia repair done 3 days ago by Dr. Rosendo Gros at the surgery center in [place], with a follow-up scheduled tomorrow. She notes that the incisions do not seem to be healing well and are still sore.   ROS:  A complete review of systems was obtained and all systems are negative except as noted in the HPI and PMH.    Pertinent History Reviewed:   Reviewed: Significant for abdominal hysterectomy with BSO, and excision of cicatrix and nevus, performed by me in 2015  Medical         Past Medical History  Diagnosis Date  . Depression   . HIV (human immunodeficiency virus infection)   . Hypertension   . Asthma   . Borderline personality disorder   . Menorrhagia with irregular cycle 06/03/2014  . Dysmenorrhea 06/03/2014  . Enlarged uterus 06/03/2014  . Herpes simplex without mention of complication   . Abnormal Pap smear of cervix 2005(EST)    ckc for abnl pap- [facility] [place]  . GERD (gastroesophageal reflux disease)   . Seizures     had as a teenager, unknown etiology and no meds.  . Fibromyalgia   . Osteoarthritis                               Surgical Hx:    Past Surgical History  Procedure Laterality Date  . Ankle surgery Right     repair of ligaments  . Back surgery    . Shoulder surgery Right     rotator cuff  . Cesarean section    . Ckc    . Abdominal hysterectomy N/A 09/01/2014    Procedure: HYSTERECTOMY ABDOMINAL;  Surgeon: Jonnie Kind, MD;  Location: [facility];  Service: Gynecology;  Laterality: N/A;  . Scar revision N/A 09/01/2014    Procedure: EXCISION OF CICATRIX;  Surgeon: Jonnie Kind, MD;   Location: [facility];  Service: Gynecology;  Laterality: N/A;  . Bilateral salpingectomy Bilateral 09/01/2014    Procedure: BILATERAL SALPINGECTOMY;  Surgeon: Jonnie Kind, MD;  Location: [facility];  Service: Gynecology;  Laterality: Bilateral;  . Excision of skin tag N/A 09/01/2014    Procedure: EXCISION OF SKIN NEVUS;  Surgeon: Jonnie Kind, MD;  Location: [facility];  Service: Gynecology;  Laterality: N/A;  . Colonoscopy N/A 12/23/2014    IPJ:ASNK canal hemorrhoids left-sided diverticula  . Esophagogastroduodenoscopy N/A 12/23/2014    NLZ:JQBHALP reflux/noncritical shatzkis ring s/p dilation/small HH  . Savory dilation N/A 12/23/2014    Procedure: SAVORY DILATION;  Surgeon: Daneil Dolin, MD;  Location: [facility];  Service: Endoscopy;  Laterality: N/A;  Venia Minks dilation N/A 12/23/2014    Procedure: Venia Minks DILATION;  Surgeon: Daneil Dolin, MD;  Location: [facility];  Service: Endoscopy;  Laterality: N/A;   Medications: Reviewed & Updated - see associated section                       Current outpatient prescriptions:  .  acyclovir (ZOVIRAX) 400 MG tablet, Take 400 mg by mouth 2 (two) times daily as needed (for prophylaxis)., Disp: , Rfl:  .  atenolol (TENORMIN) 100 MG tablet, Take 1 tablet (100 mg total) by mouth daily., Disp: 30 tablet, Rfl: 6 .  cetirizine (ZYRTEC) 10 MG tablet, Take 10 mg by mouth daily., Disp: , Rfl:  .  emtricitabine-tenofovir (TRUVADA) 200-300 MG per tablet, Take 1 tablet by mouth daily., Disp: 30 tablet, Rfl: 11 .  EPIPEN 2-PAK 0.3 MG/0.3ML SOAJ injection, Inject 0.3 mLs as directed once., Disp: , Rfl: 0 .  ondansetron (ZOFRAN) 4 MG tablet, Take 1 tablet (4 mg total) by mouth every 4 (four) hours as needed for nausea or vomiting., Disp: 30 tablet, Rfl: 0 .  OXcarbazepine (TRILEPTAL) 150 MG tablet, Take 300 mg by mouth at bedtime. , Disp: , Rfl:  .  oxyCODONE-acetaminophen (ROXICET) 5-325 MG/5ML solution, Take by mouth every 4 (four) hours as needed for severe  pain., Disp: , Rfl:  .  pantoprazole (PROTONIX) 40 MG tablet, Take 1 tablet (40 mg total) by mouth daily., Disp: 30 tablet, Rfl: 11 .  Pitavastatin Calcium 4 MG TABS, Take 4 mg by mouth at bedtime. Pitavastatin 4mg  or placebo, Disp: , Rfl:  .  raltegravir (ISENTRESS) 400 MG tablet, Take 1 tablet (400 mg total) by mouth 2 (two) times daily., Disp: 60 tablet, Rfl: 11 .  sertraline (ZOLOFT) 100 MG tablet, Take 200 mg by mouth every morning. Takes 2 daily, Disp: , Rfl:  .  terbinafine (LAMISIL) 250 MG tablet, Take 1 tablet (250 mg total) by mouth daily., Disp: 30 tablet, Rfl: 2 .  acyclovir (ZOVIRAX) 400 MG tablet, Take 1 tablet (400 mg total) by mouth 2 (two) times daily. For prophylaxis. (Patient not taking: Reported on 05/12/2015), Disp: 60 tablet, Rfl: 5 .  albuterol (PROVENTIL HFA;VENTOLIN HFA) 108 (90 BASE) MCG/ACT inhaler, Inhale 2 puffs into the lungs every 4 (four) hours as needed for wheezing or shortness of breath., Disp: , Rfl:  .  hydrochlorothiazide (HYDRODIURIL) 25 MG tablet, TAKE ONE TABLET BY MOUTH DAILY (Patient not taking: Reported on 05/12/2015), Disp: 30 tablet, Rfl: 0 .  hydrOXYzine (ATARAX/VISTARIL) 50 MG tablet, Take 50 mg by mouth every 8 (eight) hours as needed for nausea or vomiting. , Disp: , Rfl:  .  lubiprostone (AMITIZA) 8 MCG capsule, Take 1 capsule (8 mcg total) by mouth 2 (two) times daily with a meal., Disp: , Rfl:    Social History: Reviewed -  reports that she has been smoking Cigarettes.  She started smoking about 32 years ago. She has a 15 pack-year smoking history. She has never used smokeless tobacco.  Objective Findings:  Vitals: Blood pressure 122/72, pulse 60, weight 286 lb (129.729 kg), last menstrual period 08/11/2014.  Physical Examination: General appearance - alert, well appearing, and in no distress, oriented to person, place, and time and overweight Mental status - alert, oriented to person, place, and time, normal mood, behavior, speech, dress, motor  activity, and thought processes, affect appropriate to mood Abdomen - slowly healing laparoscopic sites S/P laparoscopic hernia repair  Assessment & Plan:   A:  1. Slowly healing laparoscopic sites S/P laparoscopic hernia repair 2 functional ovarian cysts, will follow P:  1. Aloe and Neosporin advised for laparoscopic hernia repair incisions.  2. Reassured pt. Return prn.   This chart was SCRIBED for Mallory Shirk, MD by Stephania Fragmin, ED Scribe. This patient was seen in room 2 and the patient's care was started  at 12:02 PM.  I personally performed the services described in this documentation, which was SCRIBED in my presence. The recorded information has been reviewed and considered accurate. It has been edited as necessary during review. Jonnie Kind, MD

## 2015-05-12 NOTE — Telephone Encounter (Signed)
Quick Note:  Please let patient know her CDiff and O+P are negative. Await stool culture. ______

## 2015-05-14 LAB — STOOL CULTURE

## 2015-05-17 NOTE — Progress Notes (Signed)
Quick Note:  All of her stool studies were negative.  If she is still having diarrhea, then she should keep her upcoming OV this week. ______

## 2015-05-19 ENCOUNTER — Ambulatory Visit (INDEPENDENT_AMBULATORY_CARE_PROVIDER_SITE_OTHER): Payer: Medicaid Other | Admitting: Gastroenterology

## 2015-05-19 ENCOUNTER — Encounter: Payer: Self-pay | Admitting: Gastroenterology

## 2015-05-19 ENCOUNTER — Other Ambulatory Visit: Payer: Self-pay | Admitting: Nurse Practitioner

## 2015-05-19 VITALS — BP 140/88 | HR 58 | Temp 98.4°F | Ht 65.0 in | Wt 284.4 lb

## 2015-05-19 DIAGNOSIS — K5900 Constipation, unspecified: Secondary | ICD-10-CM

## 2015-05-19 DIAGNOSIS — R197 Diarrhea, unspecified: Secondary | ICD-10-CM | POA: Diagnosis not present

## 2015-05-19 DIAGNOSIS — K439 Ventral hernia without obstruction or gangrene: Secondary | ICD-10-CM

## 2015-05-19 NOTE — Progress Notes (Signed)
Primary Care Physician: Rosita Fire, MD  Primary Gastroenterologist:  Garfield Cornea, MD   Chief Complaint  Patient presents with  . Follow-up    HPI: Rachel Underwood is a 44 y.o. female here for further evaluation of nausea and diarrhea. Called in recently with acute onset diarrhea. Stool studies including C. difficile PCR, O&P, culture were negative. Seen back in March for GERD, esophageal dysphagia, epigastric pain. History of erosive reflux esophagitis on EGD earlier this year. Also with chronic inactive gastritis but negative H. pylori. Colonoscopy earlier this year showed anal canal hemorrhoids, few left-sided diverticula.  At last office visit she was requiring Amitiza for bowel movements. Determined to have an umbilical hernia noted on CT. She was referred to [facility] surgery. Hernia repair 04/19/15 Dr. Rosendo Gros. Fully recovered. But noticed new hernia with recent straining to have BM.  Diarrhea started two weeks ago. Initially 7-8 times per day. Over the past several days decreasing numbers of stools. Hard stool 2 days ago. No blood in the stool. No melena. She takes oxycodone 3 times daily chronically. Denies abdominal pain, heartburn, unintentional weight loss. Feels like Amitiza 8 g twice a day no longer effective. Had actually stopped taking it prior to developing diarrhea recently.   Current Outpatient Prescriptions  Medication Sig Dispense Refill  . acyclovir (ZOVIRAX) 400 MG tablet Take 400 mg by mouth 2 (two) times daily as needed (for prophylaxis).    Marland Kitchen albuterol (PROVENTIL HFA;VENTOLIN HFA) 108 (90 BASE) MCG/ACT inhaler Inhale 2 puffs into the lungs every 4 (four) hours as needed for wheezing or shortness of breath.    Marland Kitchen atenolol (TENORMIN) 100 MG tablet Take 1 tablet (100 mg total) by mouth daily. 30 tablet 6  . cetirizine (ZYRTEC) 10 MG tablet Take 10 mg by mouth daily.    Marland Kitchen emtricitabine-tenofovir (TRUVADA) 200-300 MG per tablet Take 1 tablet by mouth daily.  30 tablet 11  . EPIPEN 2-PAK 0.3 MG/0.3ML SOAJ injection Inject 0.3 mLs as directed once.  0  . hydrochlorothiazide (HYDRODIURIL) 25 MG tablet TAKE ONE TABLET BY MOUTH DAILY 30 tablet 0  . hydrOXYzine (ATARAX/VISTARIL) 50 MG tablet Take 50 mg by mouth every 8 (eight) hours as needed for nausea or vomiting.     . lubiprostone (AMITIZA) 8 MCG capsule Take 1 capsule (8 mcg total) by mouth 2 (two) times daily with a meal.    . ondansetron (ZOFRAN) 4 MG tablet Take 1 tablet (4 mg total) by mouth every 4 (four) hours as needed for nausea or vomiting. 30 tablet 0  . OXcarbazepine (TRILEPTAL) 150 MG tablet Take 300 mg by mouth at bedtime.     Marland Kitchen oxyCODONE-acetaminophen (ROXICET) 5-325 MG/5ML solution Take by mouth every 4 (four) hours as needed for severe pain.    . pantoprazole (PROTONIX) 40 MG tablet Take 1 tablet (40 mg total) by mouth daily. 30 tablet 11  . Pitavastatin Calcium 4 MG TABS Take 4 mg by mouth at bedtime. Pitavastatin 4mg  or placebo    . raltegravir (ISENTRESS) 400 MG tablet Take 1 tablet (400 mg total) by mouth 2 (two) times daily. 60 tablet 11  . sertraline (ZOLOFT) 100 MG tablet Take 200 mg by mouth every morning. Takes 2 daily    . terbinafine (LAMISIL) 250 MG tablet Take 1 tablet (250 mg total) by mouth daily. 30 tablet 2   No current facility-administered medications for this visit.    Allergies as of 05/19/2015 - Review Complete 05/19/2015  Allergen Reaction Noted  .  Naproxen Anaphylaxis and Hives 06/03/2014  . Piroxicam Anaphylaxis 05/12/2015  . Abilify [aripiprazole]  05/12/2015  . Citalopram Hives 05/12/2015  . Codeine Nausea And Vomiting 03/25/2014  . Depakote [divalproex sodium] Swelling 05/12/2015  . Gabapentin  05/13/2014  . Haldol [haloperidol lactate] Other (See Comments) 03/25/2014  . Indomethacin Swelling 12/11/2014  . Invega [paliperidone] Other (See Comments) 06/10/2014  . Latuda [lurasidone hcl] Hives 05/12/2015  . Lithium Hives 06/03/2014  . Promethazine  Swelling 02/03/2015  . Trazodone and nefazodone Other (See Comments) 12/18/2014  . Lexapro [escitalopram oxalate] Rash 07/15/2013    ROS:  General: Negative for anorexia, weight loss, fever, chills, fatigue, weakness. ENT: Negative for hoarseness, difficulty swallowing , nasal congestion. CV: Negative for chest pain, angina, palpitations, dyspnea on exertion, peripheral edema.  Respiratory: Negative for dyspnea at rest, dyspnea on exertion, cough, sputum, wheezing.  GI: See history of present illness. GU:  Negative for dysuria, hematuria, urinary incontinence, urinary frequency, nocturnal urination.  Endo: Negative for unusual weight change.    Physical Examination:   BP 140/88 mmHg  Pulse 58  Temp(Src) 98.4 F (36.9 C) (Oral)  Ht 5\' 5"  (1.651 m)  Wt 284 lb 6.4 oz (129.003 kg)  BMI 47.33 kg/m2  LMP 08/11/2014  General: Well-nourished, well-developed in no acute distress.  Eyes: No icterus. Mouth: Oropharyngeal mucosa moist and pink , no lesions erythema or exudate. Lungs: Clear to auscultation bilaterally.  Heart: Regular rate and rhythm, no murmurs rubs or gallops.  Abdomen: Bowel sounds are normal, nontender, nondistended, no hepatosplenomegaly or masses, no abdominal bruits or hernia , no rebound or guarding.   Extremities: No lower extremity edema. No clubbing or deformities. Neuro: Alert and oriented x 4   Skin: Warm and dry, no jaundice.   Psych: Alert and cooperative, normal mood and affect. Lab Results  Component Value Date   ALT 25 04/22/2015   AST 20 04/22/2015   ALKPHOS 70 04/22/2015   BILITOT 0.3 04/22/2015   Lab Results  Component Value Date   CREATININE 0.69 04/22/2015   BUN 10 04/22/2015   NA 136 04/22/2015   K 4.2 04/22/2015   CL 102 04/22/2015   CO2 26 04/22/2015    Imaging Studies: Ct Lumbar Spine Wo Contrast  05/07/2015   CLINICAL DATA:  Chronic lumbago with bilateral radicular type symptoms  EXAM: CT LUMBAR SPINE WITHOUT CONTRAST  TECHNIQUE:  Multidetector CT imaging of the lumbar spine was performed without intravenous contrast administration. Multiplanar CT image reconstructions were also generated.  COMPARISON:  February 10, 2015 CT abdomen and pelvis with bony reformats  FINDINGS: The patient has undergone posterior pedicle screw fixation at L4-5 and L5-S1 bilaterally. The screws and plates appear intact. The screws traverse the respective pedicles bilaterally at these levels. There is a disc spacer at L4-5 which is intact. There is no erosive change or bony destruction. No fracture. There is minimal retrolisthesis of L5 on S1 felt to be due to spondylosis. No other spondylolisthesis. There is moderate disc space narrowing at T12-L1. Vacuum phenomenon is noted at L5-S1.  At T11-12, there is slight facet hypertrophy bilaterally. No nerve root edema or effacement. No disc extrusion or stenosis.  At T12-L1, there is mild facet hypertrophy bilaterally. No nerve root edema or effacement. No disc extrusion or stenosis.  At L1-2, there is mild facet hypertrophy bilaterally. No nerve root edema or effacement. No disc extrusion or stenosis.  At L2-3, there is mild facet hypertrophy bilaterally, modestly more on the right than on the left.  There is no nerve root edema or effacement. No disc extrusion or stenosis.  At L3-4, there is moderate facet hypertrophy bilaterally, slightly more on the right than on the left. No nerve root edema or effacement. No disc extrusion or stenosis.  At L4-5, there is postoperative change with moderate facet hypertrophy bilaterally. There is postoperative change posteriorly. There is no disc extrusion or stenosis. No nerve root edema or effacement is appreciable on noncontrast enhanced study.  At L5-S1, there is moderate facet hypertrophy bilaterally. There is mild diffuse disc bulging. There is exit foraminal narrowing bilaterally at this level due to bony hypertrophy. There is asymmetric disc protrusion in the medial exit foramen  on the right at L5-S1 which abuts and slightly effaces the right S1 nerve root. There is no well-defined disc extrusion or spinal stenosis, however.  There are no paraspinous lesions.  IMPRESSION: Postoperative change at L4-5 L5-S1. Asymmetric disc protrusion at the exit foramen on the right at L5-S1 which impresses upon and subtly effaces the proximal S1 nerve root on the right. Frank disc extrusion is not appreciable, however. No other nerve root edema or effacement is appreciable. No spinal stenosis. Relatively mild osteoarthritic changes noted at multiple levels. No fracture. Minimal retrolisthesis at L5-S1 is felt to be due to the underlying spondylosis.   Electronically Signed   By: Lowella Grip III M.D.   On: 05/07/2015 08:15

## 2015-05-19 NOTE — Patient Instructions (Addendum)
1. Once you feel like diarrhea has resolved, restart Amitiza but at new dose of 62mcg twice daily WITH FOOD. Call if doesn't help bloating and constipation.  2. Probiotic once daily for four weeks. [facility] is usually affordable. We did not have any samples today.  3. Call if diarrhea doesn't resolved.  4. Return to the office in six months.

## 2015-05-20 NOTE — Assessment & Plan Note (Signed)
Recommend she follow-up with [facility] surgery for new abdominal hernia if it becomes an issue. Currently asymptomatic.

## 2015-05-20 NOTE — Assessment & Plan Note (Signed)
Recent acute onset diarrhea with baseline constipation. Feels like diarrhea has essentially resolved. Had some constipation yesterday. Amitiza 8 g twice a day not as effective as it used to be. Initially try increasing to 24 g twice a day with food. If she fails to improve, would switch to Linzess as next option. Add a probiotic daily for the next month. Please call if diarrhea returns or bowel function does not improve with ongoing therapy.

## 2015-05-21 ENCOUNTER — Other Ambulatory Visit: Payer: Self-pay | Admitting: Gastroenterology

## 2015-05-21 MED ORDER — LUBIPROSTONE 24 MCG PO CAPS: 24.0000 ug | ORAL_CAPSULE | Freq: Two times a day (BID) | ORAL | Status: DC

## 2015-05-21 NOTE — Progress Notes (Signed)
cc'd to pcp 

## 2015-05-31 ENCOUNTER — Other Ambulatory Visit: Payer: Self-pay

## 2015-05-31 DIAGNOSIS — K429 Umbilical hernia without obstruction or gangrene: Secondary | ICD-10-CM

## 2015-06-01 ENCOUNTER — Other Ambulatory Visit (HOSPITAL_COMMUNITY): Payer: Self-pay | Admitting: General Surgery

## 2015-06-01 DIAGNOSIS — K429 Umbilical hernia without obstruction or gangrene: Secondary | ICD-10-CM

## 2015-06-02 ENCOUNTER — Other Ambulatory Visit: Payer: Self-pay | Admitting: *Deleted

## 2015-06-03 ENCOUNTER — Ambulatory Visit (INDEPENDENT_AMBULATORY_CARE_PROVIDER_SITE_OTHER): Payer: Medicaid Other | Admitting: Internal Medicine

## 2015-06-03 VITALS — BP 127/84 | HR 51 | Temp 98.1°F | Wt 285.0 lb

## 2015-06-03 DIAGNOSIS — M5440 Lumbago with sciatica, unspecified side: Secondary | ICD-10-CM | POA: Diagnosis not present

## 2015-06-03 DIAGNOSIS — B20 Human immunodeficiency virus [HIV] disease: Secondary | ICD-10-CM | POA: Diagnosis not present

## 2015-06-03 DIAGNOSIS — F209 Schizophrenia, unspecified: Secondary | ICD-10-CM

## 2015-06-03 NOTE — Progress Notes (Signed)
Patient ID: Rachel Underwood, female   DOB: 12-18-1970, 44 y.o.   MRN: [id number]       Patient ID: Rachel Underwood, female   DOB: Nov 04, 1971, 44 y.o.   MRN: [id number]  HPI 44yo F with hiv disease, CD 4 count of 670/VL<20, on RLG plus truvada. She is asking if she can have less pill burden. Since we last saw her, she had steroid injection for back pain as well as being evaluated for revision on abdominal hernia repair. She is doing well otherwise. Continues to care for her grand-daughter. She feels that her mood is well controlled  Outpatient Encounter Prescriptions as of 06/03/2015  Medication Sig  . acyclovir (ZOVIRAX) 400 MG tablet Take 400 mg by mouth 2 (two) times daily as needed (for prophylaxis).  Marland Kitchen albuterol (PROVENTIL HFA;VENTOLIN HFA) 108 (90 BASE) MCG/ACT inhaler Inhale 2 puffs into the lungs every 4 (four) hours as needed for wheezing or shortness of breath.  Marland Kitchen atenolol (TENORMIN) 100 MG tablet Take 1 tablet (100 mg total) by mouth daily.  . cetirizine (ZYRTEC) 10 MG tablet Take 10 mg by mouth daily.  Marland Kitchen emtricitabine-tenofovir (TRUVADA) 200-300 MG per tablet Take 1 tablet by mouth daily.  Marland Kitchen EPIPEN 2-PAK 0.3 MG/0.3ML SOAJ injection Inject 0.3 mLs as directed once.  . hydrochlorothiazide (HYDRODIURIL) 25 MG tablet TAKE ONE TABLET BY MOUTH DAILY  . hydrOXYzine (ATARAX/VISTARIL) 50 MG tablet Take 50 mg by mouth every 8 (eight) hours as needed for nausea or vomiting.   . lubiprostone (AMITIZA) 24 MCG capsule Take 1 capsule (24 mcg total) by mouth 2 (two) times daily with a meal.  . ondansetron (ZOFRAN) 4 MG tablet Take 1 tablet (4 mg total) by mouth every 4 (four) hours as needed for nausea or vomiting.  . OXcarbazepine (TRILEPTAL) 150 MG tablet Take 300 mg by mouth at bedtime.   Marland Kitchen oxyCODONE-acetaminophen (ROXICET) 5-325 MG/5ML solution Take 7.5 mg of oxycodone by mouth every 4 (four) hours as needed for severe pain.   . pantoprazole (PROTONIX) 40 MG tablet Take 1 tablet (40 mg total) by mouth  daily.  . Pitavastatin Calcium 4 MG TABS Take 4 mg by mouth at bedtime. Pitavastatin 74m or placebo  . raltegravir (ISENTRESS) 400 MG tablet Take 1 tablet (400 mg total) by mouth 2 (two) times daily.  . sertraline (ZOLOFT) 100 MG tablet Take 200 mg by mouth every morning. Takes 2 daily  . terbinafine (LAMISIL) 250 MG tablet Take 1 tablet (250 mg total) by mouth daily.   No facility-administered encounter medications on file as of 06/03/2015.     Patient Active Problem List   Diagnosis Date Noted  . Diarrhea 05/19/2015  . Cysts of both ovaries 05/12/2015  . Abdominal wall hernia 02/03/2015  . Periumbilical abdominal pain 02/03/2015  . Abdominal wall mass of periumbilical region 065/78/4696 . Schatzki's ring   . Diverticulosis of colon without hemorrhage   . Other hemorrhoids   . Rectal bleeding 12/11/2014  . GERD (gastroesophageal reflux disease) 12/11/2014  . Esophageal dysphagia 12/11/2014  . Constipation 12/11/2014  . Abdominal pain, generalized 12/11/2014  . S/P hysterectomy 09/01/2014  . Pelvic pain in female 07/22/2014  . Anovulation 06/18/2014  . DUB (dysfunctional uterine bleeding) 06/18/2014  . Abdominal wall pain 06/18/2014  . Menorrhagia with irregular cycle 06/03/2014  . Enlarged uterus 06/03/2014  . HTN (hypertension) 01/15/2014  . Influenza with other respiratory manifestations 01/07/2014  . Human immunodeficiency virus (HIV) disease 07/17/2013  . Schizophrenia 07/17/2013  . Bipolar disorder,  unspecified 07/17/2013     There are no preventive care reminders to display for this patient.   Review of Systems  Constitutional: Negative for fever, chills, diaphoresis, activity change, appetite change, fatigue and unexpected weight change.  HENT: Negative for congestion, sore throat, rhinorrhea, sneezing, trouble swallowing and sinus pressure.  Eyes: Negative for photophobia and visual disturbance.  Respiratory: Negative for cough, chest tightness, shortness of  breath, wheezing and stridor.  Cardiovascular: Negative for chest pain, palpitations and leg swelling.  Gastrointestinal: Negative for nausea, vomiting, abdominal pain, diarrhea, constipation, blood in stool, abdominal distention and anal bleeding.  Genitourinary: Negative for dysuria, hematuria, flank pain and difficulty urinating.  Musculoskeletal: Negative for myalgias, back pain, joint swelling, arthralgias and gait problem.  Skin: Negative for color change, pallor, rash and wound.  Neurological: Negative for dizziness, tremors, weakness and light-headedness.  Hematological: Negative for adenopathy. Does not bruise/bleed easily.  Psychiatric/Behavioral: Negative for behavioral problems, confusion, sleep disturbance, dysphoric mood, decreased concentration and agitation.    Physical Exam   BP 127/84 mmHg  Pulse 51  Temp(Src) 98.1 F (36.7 C) (Oral)  Wt 285 lb (129.275 kg)  LMP 08/11/2014 Physical Exam  Constitutional:  oriented to person, place, and time. appears well-developed and well-nourished. No distress.  HENT: Ada/AT, PERRLA, no scleral icterus Mouth/Throat: Oropharynx is clear and moist. No oropharyngeal exudate.  Cardiovascular: Normal rate, regular rhythm and normal heart sounds. Exam reveals no gallop and no friction rub.  No murmur heard.  Pulmonary/Chest: Effort normal and breath sounds normal. No respiratory distress.  has no wheezes.  Neck = supple, no nuchal rigidity Lymphadenopathy: no cervical adenopathy. No axillary adenopathy Psychiatric: a normal mood and affect.  behavior is normal.   Lab Results  Component Value Date   CD4TCELL 32* 04/22/2015   Lab Results  Component Value Date   CD4TABS 670 04/22/2015   CD4TABS 950 09/29/2014   CD4TABS 870 04/02/2014   Lab Results  Component Value Date   HIV1RNAQUANT <20 04/22/2015   Lab Results  Component Value Date   HEPBSAB POS* 07/15/2013   No results found for: RPR  CBC Lab Results  Component Value  Date   WBC 6.7 02/09/2015   RBC 3.48* 02/09/2015   HGB 11.5* 02/09/2015   HCT 33.5* 02/09/2015   PLT 200 02/09/2015   MCV 96.3 02/09/2015   MCH 33.0 02/09/2015   MCHC 34.3 02/09/2015   RDW 13.2 02/09/2015   LYMPHSABS 2.1 02/09/2015   MONOABS 0.5 02/09/2015   EOSABS 0.1 02/09/2015   BASOSABS 0.0 02/09/2015   BMET Lab Results  Component Value Date   NA 136 04/22/2015   K 4.2 04/22/2015   CL 102 04/22/2015   CO2 26 04/22/2015   GLUCOSE 92 04/22/2015   BUN 10 04/22/2015   CREATININE 0.69 04/22/2015   CALCIUM 9.1 04/22/2015   GFRNONAA >90 02/09/2015   GFRAA >90 02/09/2015     Assessment and Plan   hiv disease =wants to reduce pill burden. trilepta can decrease DLG concentration so we can consider BID dosing on tivicay. We will check hla [id number] and give triumeq in am and addn dlg dose at bedtime as opposed to the 3 tablets that she currently takes  schizophrenia = continue on current regimen, she appears well controlled without fluctuation in mood  Back pain = recommend to not get frequent steroid injections for pain managemetn as that increases risk for discitis. Will check in with her at next visit  rtc in 6 wk, anticipate that she  would have been on new regimen of triumeq in am plus dlg at bedtime

## 2015-06-04 ENCOUNTER — Other Ambulatory Visit: Payer: Medicaid Other

## 2015-06-04 ENCOUNTER — Ambulatory Visit (HOSPITAL_COMMUNITY)
Admission: RE | Admit: 2015-06-04 | Discharge: 2015-06-04 | Disposition: A | Discharge status: Home / Self Care | Payer: Medicaid Other | Source: Ambulatory Visit | Attending: General Surgery | Admitting: General Surgery

## 2015-06-04 DIAGNOSIS — K429 Umbilical hernia without obstruction or gangrene: Secondary | ICD-10-CM | POA: Diagnosis not present

## 2015-06-04 MED ORDER — IOHEXOL 300 MG/ML  SOLN
125.0000 mL | Freq: Once | INTRAMUSCULAR | Status: AC | PRN
  Administered 2015-06-04: 125 mL via INTRAVENOUS

## 2015-06-09 LAB — HLA B*5701: HLA-B 5701 W/RFLX HLA-B HIGH: NEGATIVE

## 2015-06-15 ENCOUNTER — Encounter (HOSPITAL_COMMUNITY): Payer: Self-pay | Admitting: Emergency Medicine

## 2015-06-15 ENCOUNTER — Emergency Department (HOSPITAL_COMMUNITY)
Admission: EM | Admit: 2015-06-15 | Discharge: 2015-06-16 | Disposition: A | Discharge status: Home / Self Care | Payer: Medicaid Other | Attending: Emergency Medicine | Admitting: Emergency Medicine

## 2015-06-15 DIAGNOSIS — I10 Essential (primary) hypertension: Secondary | ICD-10-CM | POA: Insufficient documentation

## 2015-06-15 DIAGNOSIS — Z3202 Encounter for pregnancy test, result negative: Secondary | ICD-10-CM | POA: Diagnosis not present

## 2015-06-15 DIAGNOSIS — K219 Gastro-esophageal reflux disease without esophagitis: Secondary | ICD-10-CM | POA: Insufficient documentation

## 2015-06-15 DIAGNOSIS — Z8619 Personal history of other infectious and parasitic diseases: Secondary | ICD-10-CM | POA: Insufficient documentation

## 2015-06-15 DIAGNOSIS — Z79899 Other long term (current) drug therapy: Secondary | ICD-10-CM | POA: Insufficient documentation

## 2015-06-15 DIAGNOSIS — Z8742 Personal history of other diseases of the female genital tract: Secondary | ICD-10-CM | POA: Insufficient documentation

## 2015-06-15 DIAGNOSIS — F329 Major depressive disorder, single episode, unspecified: Secondary | ICD-10-CM | POA: Diagnosis not present

## 2015-06-15 DIAGNOSIS — Z8739 Personal history of other diseases of the musculoskeletal system and connective tissue: Secondary | ICD-10-CM | POA: Diagnosis not present

## 2015-06-15 DIAGNOSIS — E663 Overweight: Secondary | ICD-10-CM | POA: Insufficient documentation

## 2015-06-15 DIAGNOSIS — Z21 Asymptomatic human immunodeficiency virus [HIV] infection status: Secondary | ICD-10-CM | POA: Diagnosis not present

## 2015-06-15 DIAGNOSIS — R1031 Right lower quadrant pain: Secondary | ICD-10-CM | POA: Diagnosis present

## 2015-06-15 DIAGNOSIS — J45909 Unspecified asthma, uncomplicated: Secondary | ICD-10-CM | POA: Diagnosis not present

## 2015-06-15 DIAGNOSIS — Z720 Tobacco use: Secondary | ICD-10-CM | POA: Insufficient documentation

## 2015-06-15 NOTE — ED Notes (Signed)
   06/15/15 2339  Abdominal  Gastrointestinal (WDL) X  Abdomen Inspection Soft;Obese  Tenderness Tender  Last Bowel Movement Date 06/15/15  pt states right sided abdominal pain on & off for the past 2 weeks. Worse today. Denies vomiting or diarreha. Pt says nausea is like normal.

## 2015-06-15 NOTE — ED Provider Notes (Signed)
CSN: [id number]     Arrival date & time 06/15/15  2228 History  This chart was scribed for Rachel Hacker, MD by Helane Gunther, ED Scribe. This patient was seen in room APA11/[facility]11 and the patient's care was started at 11:34 PM.    Chief Complaint  Patient presents with  . Abdominal Pain   The history is provided by the patient. No language interpreter was used.   HPI Comments: Rachel Underwood is a 44 y.o. female with a history of HIV, hypertension who presents to the Emergency Department complaining of intermittent, worsening, sharp right-sided abdominal pain onset 2 weeks ago. She reports associated nausea. She has never had similar pain. She has an appointment with her PCP on Thursday for assessment but that her grandmother here tonight and thought "I just figured I'd get checked out." Her last BM was this morning.currently she denies any pain. She does not have any new sexual partners. She denies fever, vomiting, dysuria, vaginal discharge.  Past Medical History  Diagnosis Date  . Depression   . HIV (human immunodeficiency virus infection)   . Hypertension   . Asthma   . Borderline personality disorder   . Menorrhagia with irregular cycle 06/03/2014  . Dysmenorrhea 06/03/2014  . Enlarged uterus 06/03/2014  . Herpes simplex without mention of complication   . Abnormal Pap smear of cervix 2005(EST)    ckc for abnl pap- [facility] [place]  . GERD (gastroesophageal reflux disease)   . Seizures     had as a teenager, unknown etiology and no meds.  . Fibromyalgia   . Osteoarthritis    Past Surgical History  Procedure Laterality Date  . Ankle surgery Right     repair of ligaments  . Back surgery    . Shoulder surgery Right     rotator cuff  . Cesarean section    . Ckc    . Abdominal hysterectomy N/A 09/01/2014    Procedure: HYSTERECTOMY ABDOMINAL;  Surgeon: Jonnie Kind, MD;  Location: [facility];  Service: Gynecology;  Laterality: N/A;  . Scar revision N/A 09/01/2014     Procedure: EXCISION OF CICATRIX;  Surgeon: Jonnie Kind, MD;  Location: [facility];  Service: Gynecology;  Laterality: N/A;  . Bilateral salpingectomy Bilateral 09/01/2014    Procedure: BILATERAL SALPINGECTOMY;  Surgeon: Jonnie Kind, MD;  Location: [facility];  Service: Gynecology;  Laterality: Bilateral;  . Excision of skin tag N/A 09/01/2014    Procedure: EXCISION OF SKIN NEVUS;  Surgeon: Jonnie Kind, MD;  Location: [facility];  Service: Gynecology;  Laterality: N/A;  . Colonoscopy N/A 12/23/2014    PPJ:KDTO canal hemorrhoids left-sided diverticula  . Esophagogastroduodenoscopy N/A 12/23/2014    IZT:IWPYKDX reflux/noncritical shatzkis ring s/p dilation/small HH  . Savory dilation N/A 12/23/2014    Procedure: SAVORY DILATION;  Surgeon: Daneil Dolin, MD;  Location: [facility];  Service: Endoscopy;  Laterality: N/A;  Venia Minks dilation N/A 12/23/2014    Procedure: Venia Minks DILATION;  Surgeon: Daneil Dolin, MD;  Location: [facility];  Service: Endoscopy;  Laterality: N/A;   Family History  Problem Relation Age of Onset  . Fibroids Mother   . Hypertension Mother   . Depression Mother   . Hypertension Father   . Mental illness Father   . Heart disease Maternal Grandmother   . Cancer Maternal Grandmother     breast  . Bipolar disorder Daughter   . Schizophrenia Daughter   . Cancer Paternal Grandfather  prostate  . Heart disease Paternal Grandfather   . Dementia Paternal Grandmother   . Heart disease Maternal Grandfather   . Alzheimer's disease Maternal Grandfather   . Anxiety disorder Daughter   . ADD / ADHD Son   . Irritable bowel syndrome Mother   . Colon cancer Neg Hx    History  Substance Use Topics  . Smoking status: Current Every Day Smoker -- 1.00 packs/day for 30 years    Types: Cigarettes    Start date: 12/04/1982  . Smokeless tobacco: Never Used  . Alcohol Use: No   OB History    Gravida Para Term Preterm AB TAB SAB Ectopic Multiple Living   10 3   7  7   3       Review of Systems  Constitutional: Negative for fever.  Respiratory: Negative for chest tightness and shortness of breath.   Cardiovascular: Negative for chest pain.  Gastrointestinal: Positive for nausea and abdominal pain. Negative for vomiting, diarrhea and constipation.  Genitourinary: Negative for dysuria, hematuria, vaginal discharge and pelvic pain.  Musculoskeletal: Negative for back pain.  Neurological: Negative for headaches.  All other systems reviewed and are negative.   Allergies  Naproxen; Piroxicam; Abilify; Citalopram; Codeine; Depakote; Gabapentin; Haldol; Indomethacin; Invega; Latuda; Lithium; Promethazine; Trazodone and nefazodone; and Lexapro  Home Medications   Prior to Admission medications   Medication Sig Start Date End Date Taking? Authorizing Provider  acyclovir (ZOVIRAX) 400 MG tablet Take 400 mg by mouth 2 (two) times daily as needed (for prophylaxis).    Historical Provider, MD  albuterol (PROVENTIL HFA;VENTOLIN HFA) 108 (90 BASE) MCG/ACT inhaler Inhale 2 puffs into the lungs every 4 (four) hours as needed for wheezing or shortness of breath.    Historical Provider, MD  atenolol (TENORMIN) 100 MG tablet Take 1 tablet (100 mg total) by mouth daily. 12/24/13   Truman Hayward, MD  cetirizine (ZYRTEC) 10 MG tablet Take 10 mg by mouth daily.    Historical Provider, MD  emtricitabine-tenofovir (TRUVADA) 200-300 MG per tablet Take 1 tablet by mouth daily. 03/04/15   Carlyle Basques, MD  EPIPEN 2-PAK 0.3 MG/0.3ML SOAJ injection Inject 0.3 mLs as directed once. 11/13/14   Historical Provider, MD  hydrochlorothiazide (HYDRODIURIL) 25 MG tablet TAKE ONE TABLET BY MOUTH DAILY 02/11/15   Jonnie Kind, MD  hydrOXYzine (ATARAX/VISTARIL) 50 MG tablet Take 50 mg by mouth every 8 (eight) hours as needed for nausea or vomiting.     Historical Provider, MD  lubiprostone (AMITIZA) 24 MCG capsule Take 1 capsule (24 mcg total) by mouth 2 (two) times daily with a meal. 05/21/15    Mahala Menghini, PA-C  ondansetron (ZOFRAN) 4 MG tablet Take 1 tablet (4 mg total) by mouth every 4 (four) hours as needed for nausea or vomiting. 05/11/15   Mahala Menghini, PA-C  OXcarbazepine (TRILEPTAL) 150 MG tablet Take 300 mg by mouth at bedtime.     Historical Provider, MD  oxyCODONE-acetaminophen (ROXICET) 5-325 MG/5ML solution Take 7.5 mg of oxycodone by mouth every 4 (four) hours as needed for severe pain.     Historical Provider, MD  pantoprazole (PROTONIX) 40 MG tablet Take 1 tablet (40 mg total) by mouth daily. 12/11/14   Mahala Menghini, PA-C  Pitavastatin Calcium 4 MG TABS Take 4 mg by mouth at bedtime. Pitavastatin 4mg  or placebo 05/19/14   Carlyle Basques, MD  raltegravir (ISENTRESS) 400 MG tablet Take 1 tablet (400 mg total) by mouth 2 (two) times daily.  03/04/15   Carlyle Basques, MD  sertraline (ZOLOFT) 100 MG tablet Take 200 mg by mouth every morning. Takes 2 daily    Historical Provider, MD  terbinafine (LAMISIL) 250 MG tablet Take 1 tablet (250 mg total) by mouth daily. 03/04/15   Carlyle Basques, MD   BP 129/97 mmHg  Pulse 101  Temp(Src) 98.7 F (37.1 C) (Oral)  Resp 15  Ht 5\' 5"  (1.651 m)  Wt 280 lb (127.007 kg)  BMI 46.59 kg/m2  SpO2 98%  LMP 08/11/2014 Physical Exam  Constitutional: She is oriented to person, place, and time. She appears well-developed and well-nourished. No distress.  Overweight  HENT:  Head: Normocephalic and atraumatic.  Cardiovascular: Normal rate, regular rhythm and normal heart sounds.   No murmur heard. Pulmonary/Chest: Effort normal and breath sounds normal. No respiratory distress. She has no wheezes.  Abdominal: Soft. Bowel sounds are normal. There is tenderness. There is no rebound and no guarding.  Mild tenderness to the lower mid and right abdomen without rebound or guarding  Neurological: She is alert and oriented to person, place, and time.  Skin: Skin is warm and dry.  Psychiatric: She has a normal mood and affect.  Nursing note and  vitals reviewed.   ED Course  Procedures  DIAGNOSTIC STUDIES: Oxygen Saturation is 98% on RA, normal by my interpretation.    COORDINATION OF CARE: 11:37 PM - Discussed plans to order diagnostic studies. Pt advised of plan for treatment and pt agrees.  Labs Review Labs Reviewed  COMPREHENSIVE METABOLIC PANEL - Abnormal; Notable for the following:    Glucose, Bld 109 (*)    All other components within normal limits  URINALYSIS, ROUTINE W REFLEX MICROSCOPIC (NOT AT [facility]) - Abnormal; Notable for the following:    Specific Gravity, Urine >1.030 (*)    All other components within normal limits  CBC WITH DIFFERENTIAL/PLATELET  LIPASE, BLOOD  PREGNANCY, URINE    Imaging Review No results found.   EKG Interpretation None      MDM   Final diagnoses:  Right lower quadrant abdominal pain    Patient presents with a two-week history of recurrent and ongoing lower abdominal pain. Associated nausea but no other associated symptoms. Nontoxic on exam. Tender without signs of peritonitis. She is status post hysterectomy and denies any vaginal complaints. She also reports that she's not currently sexually active. Basic labwork obtained and is normal. Given duration of symptoms, have low suspicion at this time for appendicitis especially given normal lab work and benign exam. Doubt ovarian torsion. At this time it is unclear what is causing the patient's pain; however, there does not appear to be an acute emergent process. Patient to follow-up with PCP as scheduled. She was given strict return precautions.  After history, exam, and medical workup I feel the patient has been appropriately medically screened and is safe for discharge home. Pertinent diagnoses were discussed with the patient. Patient was given return precautions.  I personally performed the services described in this documentation, which was scribed in my presence. The recorded information has been reviewed and is  accurate.    Rachel Hacker, MD 06/16/15 [phone]

## 2015-06-15 NOTE — ED Notes (Signed)
Pt. Reports sharp intermittent ride side abdominal x2 weeks. Pt. Reports nausea. Denies vomiting/diarrhea.

## 2015-06-16 ENCOUNTER — Other Ambulatory Visit: Payer: Self-pay | Admitting: Licensed Clinical Social Worker

## 2015-06-16 DIAGNOSIS — B351 Tinea unguium: Secondary | ICD-10-CM

## 2015-06-16 LAB — COMPREHENSIVE METABOLIC PANEL
ALT: 15 U/L (ref 14–54)
AST: 18 U/L (ref 15–41)
Albumin: 4.1 g/dL (ref 3.5–5.0)
Alkaline Phosphatase: 74 U/L (ref 38–126)
Anion gap: 5 (ref 5–15)
BUN: 10 mg/dL (ref 6–20)
CALCIUM: 8.9 mg/dL (ref 8.9–10.3)
CO2: 24 mmol/L (ref 22–32)
Chloride: 108 mmol/L (ref 101–111)
Creatinine, Ser: 0.83 mg/dL (ref 0.44–1.00)
GFR calc non Af Amer: >60 mL/min (ref >60)
Glucose, Bld: 109 mg/dL — ABNORMAL HIGH (ref 65–99)
Potassium: 3.8 mmol/L (ref 3.5–5.1)
Sodium: 137 mmol/L (ref 135–145)
TOTAL PROTEIN: 7.5 g/dL (ref 6.5–8.1)
Total Bilirubin: 0.5 mg/dL (ref 0.3–1.2)

## 2015-06-16 LAB — URINALYSIS, ROUTINE W REFLEX MICROSCOPIC
BILIRUBIN URINE: NEGATIVE
Glucose, UA: NEGATIVE mg/dL
Hgb urine dipstick: NEGATIVE
KETONES UR: NEGATIVE mg/dL
Leukocytes, UA: NEGATIVE
Nitrite: NEGATIVE
PROTEIN: NEGATIVE mg/dL
UROBILINOGEN UA: 0.2 mg/dL (ref 0.0–1.0)
pH: 5.5 (ref 5.0–8.0)

## 2015-06-16 LAB — CBC WITH DIFFERENTIAL/PLATELET
Basophils Absolute: 0 10*3/uL (ref 0.0–0.1)
Basophils Relative: 0 % (ref 0–1)
EOS ABS: 0.1 10*3/uL (ref 0.0–0.7)
EOS PCT: 1 % (ref 0–5)
HCT: 40.3 % (ref 36.0–46.0)
Hemoglobin: 14 g/dL (ref 12.0–15.0)
LYMPHS PCT: 30 % (ref 12–46)
Lymphs Abs: 2.4 10*3/uL (ref 0.7–4.0)
MCH: 31.7 pg (ref 26.0–34.0)
MCHC: 34.7 g/dL (ref 30.0–36.0)
MCV: 91.4 fL (ref 78.0–100.0)
Monocytes Absolute: 0.5 10*3/uL (ref 0.1–1.0)
Monocytes Relative: 7 % (ref 3–12)
Neutro Abs: 5 10*3/uL (ref 1.7–7.7)
Neutrophils Relative %: 62 % (ref 43–77)
PLATELETS: 169 10*3/uL (ref 150–400)
RBC: 4.41 MIL/uL (ref 3.87–5.11)
RDW: 12.9 % (ref 11.5–15.5)
WBC: 8.1 10*3/uL (ref 4.0–10.5)

## 2015-06-16 LAB — LIPASE, BLOOD: Lipase: 23 U/L (ref 22–51)

## 2015-06-16 LAB — PREGNANCY, URINE: PREG TEST UR: NEGATIVE

## 2015-06-16 MED ORDER — TERBINAFINE HCL 250 MG PO TABS: 250.0000 mg | ORAL_TABLET | Freq: Every day | ORAL | Status: DC

## 2015-06-16 NOTE — Discharge Instructions (Signed)
You were seen today for abdominal pain. Your workup is reassuring. You need to follow up with your primary physician.  Given the duration of your pain, I have low suspicion at this time for appendicitis. If your pain worsens or you have any new or worsening symptoms, you should be reevaluated.  Abdominal Pain, Women Abdominal (stomach, pelvic, or belly) pain can be caused by many things. It is important to tell your doctor:  The location of the pain.  Does it come and go or is it present all the time?  Are there things that start the pain (eating certain foods, exercise)?  Are there other symptoms associated with the pain (fever, nausea, vomiting, diarrhea)? All of this is helpful to know when trying to find the cause of the pain. CAUSES   Stomach: virus or bacteria infection, or ulcer.  Intestine: appendicitis (inflamed appendix), regional ileitis (Crohn's disease), ulcerative colitis (inflamed colon), irritable bowel syndrome, diverticulitis (inflamed diverticulum of the colon), or cancer of the stomach or intestine.  Gallbladder disease or stones in the gallbladder.  Kidney disease, kidney stones, or infection.  Pancreas infection or cancer.  Fibromyalgia (pain disorder).  Diseases of the female organs:  Uterus: fibroid (non-cancerous) tumors or infection.  Fallopian tubes: infection or tubal pregnancy.  Ovary: cysts or tumors.  Pelvic adhesions (scar tissue).  Endometriosis (uterus lining tissue growing in the pelvis and on the pelvic organs).  Pelvic congestion syndrome (female organs filling up with blood just before the menstrual period).  Pain with the menstrual period.  Pain with ovulation (producing an egg).  Pain with an IUD (intrauterine device, birth control) in the uterus.  Cancer of the female organs.  Functional pain (pain not caused by a disease, may improve without treatment).  Psychological pain.  Depression. DIAGNOSIS  Your doctor will decide  the seriousness of your pain by doing an examination.  Blood tests.  X-rays.  Ultrasound.  CT scan (computed tomography, special type of X-ray).  MRI (magnetic resonance imaging).  Cultures, for infection.  Barium enema (dye inserted in the large intestine, to better view it with X-rays).  Colonoscopy (looking in intestine with a lighted tube).  Laparoscopy (minor surgery, looking in abdomen with a lighted tube).  Major abdominal exploratory surgery (looking in abdomen with a large incision). TREATMENT  The treatment will depend on the cause of the pain.   Many cases can be observed and treated at home.  Over-the-counter medicines recommended by your caregiver.  Prescription medicine.  Antibiotics, for infection.  Birth control pills, for painful periods or for ovulation pain.  Hormone treatment, for endometriosis.  Nerve blocking injections.  Physical therapy.  Antidepressants.  Counseling with a psychologist or psychiatrist.  Minor or major surgery. HOME CARE INSTRUCTIONS   Do not take laxatives, unless directed by your caregiver.  Take over-the-counter pain medicine only if ordered by your caregiver. Do not take aspirin because it can cause an upset stomach or bleeding.  Try a clear liquid diet (broth or water) as ordered by your caregiver. Slowly move to a bland diet, as tolerated, if the pain is related to the stomach or intestine.  Have a thermometer and take your temperature several times a day, and record it.  Bed rest and sleep, if it helps the pain.  Avoid sexual intercourse, if it causes pain.  Avoid stressful situations.  Keep your follow-up appointments and tests, as your caregiver orders.  If the pain does not go away with medicine or surgery, you may  try:  Acupuncture.  Relaxation exercises (yoga, meditation).  Group therapy.  Counseling. SEEK MEDICAL CARE IF:   You notice certain foods cause stomach pain.  Your home care  treatment is not helping your pain.  You need stronger pain medicine.  You want your IUD removed.  You feel faint or lightheaded.  You develop nausea and vomiting.  You develop a rash.  You are having side effects or an allergy to your medicine. SEEK IMMEDIATE MEDICAL CARE IF:   Your pain does not go away or gets worse.  You have a fever.  Your pain is felt only in portions of the abdomen. The right side could possibly be appendicitis. The left lower portion of the abdomen could be colitis or diverticulitis.  You are passing blood in your stools (bright red or black tarry stools, with or without vomiting).  You have blood in your urine.  You develop chills, with or without a fever.  You pass out. MAKE SURE YOU:   Understand these instructions.  Will watch your condition.  Will get help right away if you are not doing well or get worse. Document Released: 09/17/2007 Document Revised: 04/06/2014 Document Reviewed: 10/07/2009 [facility] Patient Information 2015 [place], [place]. This information is not intended to replace advice given to you by your health care provider. Make sure you discuss any questions you have with your health care provider.

## 2015-06-16 NOTE — ED Notes (Signed)
Pt given ginger ale to drink after EDP spoke to pt.

## 2015-06-16 NOTE — ED Notes (Signed)
Pt alert & oriented x4, stable gait. Patient given discharge instructions, paperwork & prescription(s). Patient  instructed to stop at the registration desk to finish any additional paperwork. Patient verbalized understanding. Pt left department w/ no further questions. 

## 2015-06-22 ENCOUNTER — Other Ambulatory Visit: Payer: Self-pay | Admitting: Internal Medicine

## 2015-06-22 DIAGNOSIS — B20 Human immunodeficiency virus [HIV] disease: Secondary | ICD-10-CM

## 2015-06-22 MED ORDER — DOLUTEGRAVIR SODIUM 50 MG PO TABS: 50.0000 mg | ORAL_TABLET | Freq: Every day | ORAL | Status: DC

## 2015-06-22 MED ORDER — ABACAVIR-DOLUTEGRAVIR-LAMIVUD 600-50-300 MG PO TABS: 1.0000 | ORAL_TABLET | Freq: Every day | ORAL | Status: DC

## 2015-06-22 NOTE — Progress Notes (Signed)
Called pharmacy to change her to triomeq in am  And tivicay in the evening. Will d/c truvada plus RLG

## 2015-07-06 ENCOUNTER — Telehealth: Payer: Self-pay | Admitting: *Deleted

## 2015-07-06 NOTE — Telephone Encounter (Signed)
Patient called c/o a cough and believes it is bronchitis. Advised patient to call her PCP to request an appointment. She agreed to this. Myrtis Hopping

## 2015-07-10 ENCOUNTER — Other Ambulatory Visit: Payer: Self-pay | Admitting: Gastroenterology

## 2015-07-12 ENCOUNTER — Other Ambulatory Visit: Payer: Self-pay

## 2015-07-15 ENCOUNTER — Ambulatory Visit: Payer: Medicaid Other | Admitting: Internal Medicine

## 2015-07-16 ENCOUNTER — Emergency Department (HOSPITAL_COMMUNITY)
Admission: EM | Admit: 2015-07-16 | Discharge: 2015-07-16 | Disposition: A | Discharge status: Home / Self Care | Payer: Medicaid Other | Attending: Emergency Medicine | Admitting: Emergency Medicine

## 2015-07-16 ENCOUNTER — Encounter (HOSPITAL_COMMUNITY): Payer: Self-pay | Admitting: Emergency Medicine

## 2015-07-16 DIAGNOSIS — M1990 Unspecified osteoarthritis, unspecified site: Secondary | ICD-10-CM | POA: Insufficient documentation

## 2015-07-16 DIAGNOSIS — Z21 Asymptomatic human immunodeficiency virus [HIV] infection status: Secondary | ICD-10-CM | POA: Insufficient documentation

## 2015-07-16 DIAGNOSIS — Z8742 Personal history of other diseases of the female genital tract: Secondary | ICD-10-CM | POA: Insufficient documentation

## 2015-07-16 DIAGNOSIS — J45909 Unspecified asthma, uncomplicated: Secondary | ICD-10-CM | POA: Diagnosis not present

## 2015-07-16 DIAGNOSIS — F329 Major depressive disorder, single episode, unspecified: Secondary | ICD-10-CM | POA: Insufficient documentation

## 2015-07-16 DIAGNOSIS — I10 Essential (primary) hypertension: Secondary | ICD-10-CM | POA: Insufficient documentation

## 2015-07-16 DIAGNOSIS — Z79899 Other long term (current) drug therapy: Secondary | ICD-10-CM | POA: Diagnosis not present

## 2015-07-16 DIAGNOSIS — K219 Gastro-esophageal reflux disease without esophagitis: Secondary | ICD-10-CM | POA: Diagnosis not present

## 2015-07-16 DIAGNOSIS — R21 Rash and other nonspecific skin eruption: Secondary | ICD-10-CM | POA: Diagnosis present

## 2015-07-16 DIAGNOSIS — Z8659 Personal history of other mental and behavioral disorders: Secondary | ICD-10-CM | POA: Diagnosis not present

## 2015-07-16 DIAGNOSIS — F603 Borderline personality disorder: Secondary | ICD-10-CM | POA: Insufficient documentation

## 2015-07-16 DIAGNOSIS — Z720 Tobacco use: Secondary | ICD-10-CM | POA: Diagnosis not present

## 2015-07-16 DIAGNOSIS — L259 Unspecified contact dermatitis, unspecified cause: Secondary | ICD-10-CM | POA: Diagnosis not present

## 2015-07-16 MED ORDER — PREDNISONE 20 MG PO TABS: 60.0000 mg | ORAL_TABLET | Freq: Every day | ORAL | Status: AC

## 2015-07-16 MED ORDER — ERYTHROMYCIN 5 MG/GM OP OINT
1.0000 "application " | TOPICAL_OINTMENT | Freq: Four times a day (QID) | OPHTHALMIC | Status: DC
  Administered 2015-07-16: 1 via OPHTHALMIC
  Filled 2015-07-16: qty 3.5

## 2015-07-16 MED ORDER — PREDNISONE 10 MG PO TABS
60.0000 mg | ORAL_TABLET | ORAL | Status: AC
  Administered 2015-07-16: 60 mg via ORAL
  Filled 2015-07-16 (×2): qty 1

## 2015-07-16 NOTE — Discharge Instructions (Signed)
As discussed it is very important to take all medication as directed, and do not hesitate to return if you develop new, or concerning changes in your condition.    Otherwise, please take all medication as directed, and be sure to follow-up with your physician.  Please use the provided eye ointment 4 times daily for the next 3 days.

## 2015-07-16 NOTE — ED Notes (Signed)
MD at bedside. 

## 2015-07-16 NOTE — ED Notes (Signed)
Out is woods yesterday.  Rash to face, arms, neck and chest.  C/o itching and burning.

## 2015-07-16 NOTE — ED Provider Notes (Signed)
CSN: [id number]     Arrival date & time 07/16/15  1034 History  This chart was scribed for Carmin Muskrat, MD by Terressa Koyanagi, ED Scribe. This patient was seen in room APA17/APA17 and the patient's care was started at 11:12 AM.   Chief Complaint  Patient presents with  . Rash   The history is provided by the patient. No language interpreter was used.   PCP: Rosita Fire, MD HPI Comments: Antwonette Kendrick is a 44 y.o. female, with PMHx noted below, who presents to the Emergency Department complaining of rash with associated redness, itching and burning to the face, bilateral arms, neck and chest onset yesterday. Pt also complains of associated swelling of the face onset yesterday. Pt reports she was in the woods 4 days ago looking for her kittens but denies any other new exposures. Pt notes a Hx of similar breakouts reporting her last episode was one year ago.  Pt reports using oral and topical benadryl without relief. Pt denies fever, difficulty swallowing, difficulty breathing, or any other Sx at this time. Past Medical History  Diagnosis Date  . Depression   . HIV (human immunodeficiency virus infection)   . Hypertension   . Asthma   . Borderline personality disorder   . Menorrhagia with irregular cycle 06/03/2014  . Dysmenorrhea 06/03/2014  . Enlarged uterus 06/03/2014  . Herpes simplex without mention of complication   . Abnormal Pap smear of cervix 2005(EST)    ckc for abnl pap- [facility] [place]  . GERD (gastroesophageal reflux disease)   . Seizures     had as a teenager, unknown etiology and no meds.  . Fibromyalgia   . Osteoarthritis    Past Surgical History  Procedure Laterality Date  . Ankle surgery Right     repair of ligaments  . Back surgery    . Shoulder surgery Right     rotator cuff  . Cesarean section    . Ckc    . Abdominal hysterectomy N/A 09/01/2014    Procedure: HYSTERECTOMY ABDOMINAL;  Surgeon: Jonnie Kind, MD;  Location: [facility];  Service:  Gynecology;  Laterality: N/A;  . Scar revision N/A 09/01/2014    Procedure: EXCISION OF CICATRIX;  Surgeon: Jonnie Kind, MD;  Location: [facility];  Service: Gynecology;  Laterality: N/A;  . Bilateral salpingectomy Bilateral 09/01/2014    Procedure: BILATERAL SALPINGECTOMY;  Surgeon: Jonnie Kind, MD;  Location: [facility];  Service: Gynecology;  Laterality: Bilateral;  . Excision of skin tag N/A 09/01/2014    Procedure: EXCISION OF SKIN NEVUS;  Surgeon: Jonnie Kind, MD;  Location: [facility];  Service: Gynecology;  Laterality: N/A;  . Colonoscopy N/A 12/23/2014    RJJ:OACZ canal hemorrhoids left-sided diverticula  . Esophagogastroduodenoscopy N/A 12/23/2014    YSA:YTKZSWF reflux/noncritical shatzkis ring s/p dilation/small HH  . Savory dilation N/A 12/23/2014    Procedure: SAVORY DILATION;  Surgeon: Daneil Dolin, MD;  Location: [facility];  Service: Endoscopy;  Laterality: N/A;  Venia Minks dilation N/A 12/23/2014    Procedure: Venia Minks DILATION;  Surgeon: Daneil Dolin, MD;  Location: [facility];  Service: Endoscopy;  Laterality: N/A;   Family History  Problem Relation Age of Onset  . Fibroids Mother   . Hypertension Mother   . Depression Mother   . Hypertension Father   . Mental illness Father   . Heart disease Maternal Grandmother   . Cancer Maternal Grandmother     breast  . Bipolar disorder  Daughter   . Schizophrenia Daughter   . Cancer Paternal Grandfather     prostate  . Heart disease Paternal Grandfather   . Dementia Paternal Grandmother   . Heart disease Maternal Grandfather   . Alzheimer's disease Maternal Grandfather   . Anxiety disorder Daughter   . ADD / ADHD Son   . Irritable bowel syndrome Mother   . Colon cancer Neg Hx    Social History  Substance Use Topics  . Smoking status: Current Every Day Smoker -- 1.00 packs/day for 30 years    Types: Cigarettes    Start date: 12/04/1982  . Smokeless tobacco: Never Used  . Alcohol Use: No   OB History    Gravida  Para Term Preterm AB TAB SAB Ectopic Multiple Living   10 3   7  7   3      Review of Systems  Constitutional:       Per HPI, otherwise negative  HENT:       Per HPI, otherwise negative  Respiratory:       Per HPI, otherwise negative  Cardiovascular:       Per HPI, otherwise negative  Gastrointestinal: Negative for vomiting.  Endocrine:       Negative aside from HPI  Genitourinary:       Neg aside from HPI   Musculoskeletal:       Per HPI, otherwise negative  Skin: Positive for color change.  Neurological: Negative for syncope.      Allergies  Naproxen; Piroxicam; Abilify; Citalopram; Codeine; Depakote; Gabapentin; Haldol; Indomethacin; Invega; Latuda; Lithium; Promethazine; Trazodone and nefazodone; and Lexapro  Home Medications   Prior to Admission medications   Medication Sig Start Date End Date Taking? Authorizing Provider  Abacavir-Dolutegravir-Lamivud (TRIUMEQ) 600-50-300 MG TABS Take 1 Dose by mouth daily with breakfast. 06/22/15   Carlyle Basques, MD  acyclovir (ZOVIRAX) 400 MG tablet Take 400 mg by mouth 2 (two) times daily as needed (for prophylaxis).    Historical Provider, MD  albuterol (PROVENTIL HFA;VENTOLIN HFA) 108 (90 BASE) MCG/ACT inhaler Inhale 2 puffs into the lungs every 4 (four) hours as needed for wheezing or shortness of breath.    Historical Provider, MD  atenolol (TENORMIN) 100 MG tablet Take 1 tablet (100 mg total) by mouth daily. 12/24/13   Truman Hayward, MD  cetirizine (ZYRTEC) 10 MG tablet Take 10 mg by mouth daily.    Historical Provider, MD  dolutegravir (TIVICAY) 50 MG tablet Take 1 tablet (50 mg total) by mouth at bedtime. 06/22/15   Carlyle Basques, MD  EPIPEN 2-PAK 0.3 MG/0.3ML SOAJ injection Inject 0.3 mLs as directed once. 11/13/14   Historical Provider, MD  hydrochlorothiazide (HYDRODIURIL) 25 MG tablet TAKE ONE TABLET BY MOUTH DAILY 02/11/15   Jonnie Kind, MD  hydrOXYzine (ATARAX/VISTARIL) 50 MG tablet Take 50 mg by mouth every 8  (eight) hours as needed for nausea or vomiting.     Historical Provider, MD  lubiprostone (AMITIZA) 24 MCG capsule Take 1 capsule (24 mcg total) by mouth 2 (two) times daily with a meal. 05/21/15   Mahala Menghini, PA-C  ondansetron (ZOFRAN) 4 MG tablet Take 1 tablet (4 mg total) by mouth every 4 (four) hours as needed for nausea or vomiting. 05/11/15   Mahala Menghini, PA-C  OXcarbazepine (TRILEPTAL) 150 MG tablet Take 300 mg by mouth at bedtime.     Historical Provider, MD  oxyCODONE-acetaminophen (ROXICET) 5-325 MG/5ML solution Take 7.5 mg of oxycodone by mouth every  4 (four) hours as needed for severe pain.     Historical Provider, MD  pantoprazole (PROTONIX) 40 MG tablet Take 1 tablet (40 mg total) by mouth daily. 12/11/14   Mahala Menghini, PA-C  Pitavastatin Calcium 4 MG TABS Take 4 mg by mouth at bedtime. Pitavastatin 4mg  or placebo 05/19/14   Carlyle Basques, MD  sertraline (ZOLOFT) 100 MG tablet Take 200 mg by mouth every morning. Takes 2 daily    Historical Provider, MD  terbinafine (LAMISIL) 250 MG tablet Take 1 tablet (250 mg total) by mouth daily. 06/16/15   Carlyle Basques, MD   Triage Vitals: BP 108/73 mmHg  Pulse 62  Temp(Src) 97.9 F (36.6 C) (Oral)  Resp 16  Ht 5\' 5"  (1.651 m)  SpO2 99%  LMP 08/11/2014 Physical Exam  Constitutional: She is oriented to person, place, and time. She appears well-developed and well-nourished. No distress.  HENT:  Head: Normocephalic.  Mouth/Throat: Oropharynx is clear and moist.  Facial erythema, edema symmetrically, with both orbits involved  Eyes: Conjunctivae and EOM are normal. Right eye exhibits no discharge. Left eye exhibits no discharge.  Cardiovascular: Normal rate, regular rhythm and normal heart sounds.   Pulmonary/Chest: Effort normal and breath sounds normal. No stridor. No respiratory distress.  Abdominal: She exhibits no distension.  Musculoskeletal: She exhibits no edema.  Neurological: She is alert and oriented to person, place, and  time. No cranial nerve deficit.  Skin: Skin is warm and dry. Rash noted.  Psychiatric: She has a normal mood and affect.  Nursing note and vitals reviewed.   ED Course  Procedures (including critical care time) COORDINATION OF CARE: 11:15 AM: Discussed treatment plan which includes steroids and eye meds with pt at bedside; patient verbalizes understanding and agrees with treatment plan.  MDM   Final diagnoses:  Contact dermatitis   patient presents several days after exposure to poison ivy with cutaneous lesions on her arms, chest, face. No evidence for anaphylaxis, nor oropharyngeal compromise. However, given the facial swelling, patient did receive a short course of steroids, and is already taking anti-histamines. Patient was prepared for discharge with further evaluation, management as an outpatient.  I personally performed the services described in this documentation, which was scribed in my presence. The recorded information has been reviewed and is accurate.     Carmin Muskrat, MD 07/16/15 1556

## 2015-07-19 ENCOUNTER — Telehealth: Payer: Self-pay

## 2015-07-19 NOTE — Telephone Encounter (Signed)
Do we know which med needs refill?

## 2015-07-19 NOTE — Telephone Encounter (Signed)
This was open in error

## 2015-08-16 ENCOUNTER — Other Ambulatory Visit: Payer: Self-pay

## 2015-08-17 MED ORDER — ONDANSETRON HCL 4 MG PO TABS: 4.0000 mg | ORAL_TABLET | ORAL | Status: DC | PRN

## 2015-08-19 ENCOUNTER — Encounter (INDEPENDENT_AMBULATORY_CARE_PROVIDER_SITE_OTHER): Payer: Medicaid Other | Admitting: *Deleted

## 2015-08-19 VITALS — BP 121/82 | HR 65 | Temp 98.4°F | Resp 18 | Wt 289.5 lb

## 2015-08-19 DIAGNOSIS — Z006 Encounter for examination for normal comparison and control in clinical research program: Secondary | ICD-10-CM

## 2015-08-23 NOTE — Progress Notes (Signed)
Rachel Underwood is here for A5332, month 16. She denies any muscle aches/weakness. She states she has not missed any study medication. #57 pill returned at visit. Vital are stable. No blood drawn at this visit. Study medication was dispensed. She received $50 gift card for visit. Next appointment in January. Eliezer Champagne RN

## 2015-09-03 ENCOUNTER — Telehealth: Payer: Self-pay

## 2015-09-03 NOTE — Telephone Encounter (Signed)
Pt is calling because she is wanting something strong the the zofran . She is having to take 2 pill every 4 hours and she is still vomiting. SHe had a refill on 08/17/15 with 30 pills and she is out now. Please advise

## 2015-09-10 MED ORDER — ONDANSETRON HCL 4 MG PO TABS: 4.0000 mg | ORAL_TABLET | ORAL | Status: DC | PRN

## 2015-09-10 NOTE — Telephone Encounter (Signed)
Pt is aware, Her new phone is [phone]

## 2015-09-10 NOTE — Telephone Encounter (Signed)
I'm sorry for the late response. I must have "unhighlighted" this and didn't realize it was still here.   We haven't seen her since June 2016.   Make sure she is taking Protonix once daily.  Take Zofran with meals. Follow a liquid diet for 24 hours and advance as tolerated. Concern for underlying gastroparesis. Please arrange non-urgent office visit. To ED if unable to tolerate liquids and has signs/symptoms of dehydration. She is allergic to Phenergan, so I can't do that.

## 2015-10-05 ENCOUNTER — Telehealth: Payer: Self-pay | Admitting: *Deleted

## 2015-10-05 DIAGNOSIS — B20 Human immunodeficiency virus [HIV] disease: Secondary | ICD-10-CM

## 2015-10-05 NOTE — Telephone Encounter (Signed)
Pt reporting problems with new HIV regimen:  Trouble breathing, stomach pain, increased urination and increased blood pressure.  Pt is having difficulty tolerating these symptoms.  Pt is wanting to switch back to her previous regimen.  She is unable to come for an appt in November due to other MD appts.  Pt given a follow-up appt for Wed., Dec. 7 at 2:45 PM with Dr. Baxter Flattery.  MD please advise about medication changes.  Pt is expecting a call back.

## 2015-10-07 NOTE — Telephone Encounter (Signed)
Can switch her back to her old regimen

## 2015-10-08 ENCOUNTER — Telehealth: Payer: Self-pay | Admitting: *Deleted

## 2015-10-08 NOTE — Telephone Encounter (Signed)
Stp who wants to establish care here. Pt sees psych, HIV specialist and pain management and will continue to see them but just needs someone to do her physicals and refills on her inhalers and allergy medications. Pt was given appt with Dr.Bradshaw and aware to arrive 30 minutes prior.

## 2015-10-12 MED ORDER — ELVITEG-COBIC-EMTRICIT-TENOFDF 150-150-200-300 MG PO TABS: 1.0000 | ORAL_TABLET | Freq: Every day | ORAL | Status: DC

## 2015-10-12 NOTE — Addendum Note (Signed)
Addended by: Lorne Skeens D on: 10/12/2015 04:32 PM   Modules accepted: Orders, Medications

## 2015-10-12 NOTE — Telephone Encounter (Signed)
Phone call to the pt.  Wants to change back to Stribild, previous regimen.  Order for Stribild called to [facility] in [place].  Pt notified that drug will be ready today.

## 2015-10-19 ENCOUNTER — Telehealth: Payer: Self-pay | Admitting: Internal Medicine

## 2015-10-19 MED ORDER — DEXLANSOPRAZOLE 60 MG PO CPDR: 60.0000 mg | DELAYED_RELEASE_CAPSULE | Freq: Every day | ORAL | Status: DC

## 2015-10-19 NOTE — Telephone Encounter (Signed)
rx sent for dexilant.

## 2015-10-19 NOTE — Addendum Note (Signed)
Addended by: Mahala Menghini on: 10/19/2015 12:40 PM   Modules accepted: Orders

## 2015-10-19 NOTE — Telephone Encounter (Signed)
Sent pt message via mychart

## 2015-10-19 NOTE — Telephone Encounter (Signed)
Patient called wanting to speak with LSL. I told her LSL was seeing patients. Pt wants something stronger than what she is taking for reflux. She uses [facility] and needs this called in before 2 pm. Please advise. [phone]

## 2015-10-19 NOTE — Telephone Encounter (Signed)
Pt is taking protonix

## 2015-10-26 ENCOUNTER — Other Ambulatory Visit: Payer: Self-pay | Admitting: Gastroenterology

## 2015-11-09 ENCOUNTER — Ambulatory Visit (INDEPENDENT_AMBULATORY_CARE_PROVIDER_SITE_OTHER): Payer: Medicaid Other | Admitting: Family Medicine

## 2015-11-09 ENCOUNTER — Encounter: Payer: Self-pay | Admitting: Family Medicine

## 2015-11-09 VITALS — BP 132/86 | HR 53 | Temp 98.3°F | Ht 64.0 in | Wt 286.0 lb

## 2015-11-09 DIAGNOSIS — R519 Headache, unspecified: Secondary | ICD-10-CM | POA: Insufficient documentation

## 2015-11-09 DIAGNOSIS — G8929 Other chronic pain: Secondary | ICD-10-CM | POA: Diagnosis not present

## 2015-11-09 DIAGNOSIS — R51 Headache: Secondary | ICD-10-CM | POA: Diagnosis not present

## 2015-11-09 DIAGNOSIS — M549 Dorsalgia, unspecified: Secondary | ICD-10-CM

## 2015-11-09 DIAGNOSIS — Z9889 Other specified postprocedural states: Secondary | ICD-10-CM | POA: Insufficient documentation

## 2015-11-09 MED ORDER — TOPIRAMATE 50 MG PO TABS: ORAL_TABLET | ORAL | Status: DC

## 2015-11-09 MED ORDER — ONDANSETRON HCL 8 MG PO TABS: 8.0000 mg | ORAL_TABLET | Freq: Three times a day (TID) | ORAL | Status: DC | PRN

## 2015-11-09 NOTE — Patient Instructions (Addendum)
Great to meet you!  Follow up in about 4 weeks  I have sent a higher dose of zofran  Lets start topamax for headache prophylaxis.  1/2 pill for 8 days then 1 pill daily

## 2015-11-09 NOTE — Progress Notes (Signed)
HPI  Patient presents today to establish care.  Headaches Patient explains that she's had recurrent headaches over the last 6 days better non-intractable that have been mostly coming:. Besides this current exacerbation she generally has about 6 headaches a month She describes her headaches as right sided throbbing type headaches associated with nausea and worsened by certain smells and sometimes lights. Most of the time they last several hours but occasionally they resolve by themselves after 2 hours. She takes ibuprofen for abortive therapy which helps some, however she is cautious with this medication if she has a history of peptic ulcer She has not tried Tylenol.  Chronic back and neck pain She has an established relationship [facility], she also gets spine injections at the spine center in [facility], she ask for another referral there today as her Medicaid card has changed.  She is pursuing permanent disability through [employer] currently.  She denies any weakness today, she states that sometimes she has weakness in the hands due to carpal tunnel syndrome.  She has complex past medical history with HIV that is well controlled, hypertension, bipolar disorder, and schizophrenia. She is status post hysterectomy and uses condoms  PMH: Smoking status noted Past medical, surgical, social, family history reviewed and updated in EMR ROS: Per HPI  Objective: BP 132/86 mmHg  Pulse 53  Temp(Src) 98.3 F (36.8 C) (Oral)  Ht 5\' 4"  (1.626 m)  Wt 286 lb (129.729 kg)  BMI 49.07 kg/m2  LMP 08/11/2014 Gen: NAD, alert, cooperative with exam HEENT: NCAT, PERRLA CV: RRR, good S1/S2, no murmur Resp: CTABL, no wheezes, non-labored Abd: SNTND, BS present, no guarding or organomegaly, obese Ext: No edema, warm Neuro: Alert and oriented, strength 5/5 and sensation intact in all 4 extremities, normal gait  Assessment and plan:  # Headaches Consistent with migraine  headaches, however she does have some better bit too short to fit into the normal characteristics of migraine headaches. She is already on good dose of beta blocker, this is possibly helping control her to her usual 6 headaches per month. Also add on Topamax for prophylaxis, I am hesitant to start TCAs or SNRIs with her concomitant bipolar disorder and schizophrenia well managed on Trileptal  Ask her to avoid ibuprofen as she has a history of peptic ulcer disease Tylenol for abortive therapy, no current headache Follow-up one month  # Chronic pain Chronic neck and back pain with some right-sided leg symptoms of sciatica Referring to the spine Center in [place], she has an established relationship there. Also on chronic narcotics from pain management  # Hypertension Good control, labs up-to-date  She has several specialists including  infectious disease Dr. Graylon [facility], [facility]- Dr. Delorise Jackson [facility]- Neil Crouch, at [facility]- Dr. Bronson Ing  Orders Placed This Encounter  Procedures  . Ambulatory referral to Spine Surgery    Referral Priority:  Routine    Referral Type:  Surgical    Referral Reason:  Specialty Services Required    Requested Specialty:  Neurosurgery    Number of Visits Requested:  1    Meds ordered this encounter  Medications  . hydrocortisone (ANUSOL-HC) 2.5 % rectal cream    Sig: Place 1 application rectally 2 (two) times daily.  Marland Kitchen topiramate (TOPAMAX) 50 MG tablet    Sig: Take 1/2 tab daily for 8 days then take 1 pill dialy    Dispense:  30 tablet    Refill:  3  .  ondansetron (ZOFRAN) 8 MG tablet    Sig: Take 1 tablet (8 mg total) by mouth every 8 (eight) hours as needed for nausea or vomiting.    Dispense:  60 tablet    Refill:  [place], MD [facility] 11/09/2015, 1:55 PM

## 2015-11-10 ENCOUNTER — Telehealth: Payer: Self-pay | Admitting: Family Medicine

## 2015-11-10 ENCOUNTER — Ambulatory Visit (INDEPENDENT_AMBULATORY_CARE_PROVIDER_SITE_OTHER): Payer: Medicaid Other | Admitting: Internal Medicine

## 2015-11-10 ENCOUNTER — Encounter: Payer: Self-pay | Admitting: Internal Medicine

## 2015-11-10 VITALS — BP 117/82 | HR 51 | Temp 98.1°F | Ht 64.0 in | Wt 285.0 lb

## 2015-11-10 DIAGNOSIS — F209 Schizophrenia, unspecified: Secondary | ICD-10-CM | POA: Diagnosis not present

## 2015-11-10 DIAGNOSIS — B20 Human immunodeficiency virus [HIV] disease: Secondary | ICD-10-CM | POA: Diagnosis not present

## 2015-11-10 DIAGNOSIS — G43111 Migraine with aura, intractable, with status migrainosus: Secondary | ICD-10-CM | POA: Diagnosis not present

## 2015-11-10 LAB — CBC WITH DIFFERENTIAL/PLATELET
BASOS ABS: 0 10*3/uL (ref 0.0–0.1)
BASOS PCT: 0 % (ref 0–1)
EOS ABS: 0.1 10*3/uL (ref 0.0–0.7)
EOS PCT: 1 % (ref 0–5)
HCT: 39 % (ref 36.0–46.0)
Hemoglobin: 13.5 g/dL (ref 12.0–15.0)
Lymphocytes Relative: 36 % (ref 12–46)
Lymphs Abs: 2.5 10*3/uL (ref 0.7–4.0)
MCH: 32.8 pg (ref 26.0–34.0)
MCHC: 34.6 g/dL (ref 30.0–36.0)
MCV: 94.9 fL (ref 78.0–100.0)
MONO ABS: 0.6 10*3/uL (ref 0.1–1.0)
MPV: 9 fL (ref 8.6–12.4)
Monocytes Relative: 8 % (ref 3–12)
Neutro Abs: 3.8 10*3/uL (ref 1.7–7.7)
Neutrophils Relative %: 55 % (ref 43–77)
PLATELETS: 208 10*3/uL (ref 150–400)
RBC: 4.11 MIL/uL (ref 3.87–5.11)
RDW: 13.4 % (ref 11.5–15.5)
WBC: 6.9 10*3/uL (ref 4.0–10.5)

## 2015-11-10 LAB — COMPLETE METABOLIC PANEL WITH GFR
ALT: 12 U/L (ref 6–29)
AST: 14 U/L (ref 10–30)
Albumin: 4.2 g/dL (ref 3.6–5.1)
Alkaline Phosphatase: 67 U/L (ref 33–115)
BUN: 7 mg/dL (ref 7–25)
CALCIUM: 9.7 mg/dL (ref 8.6–10.2)
CHLORIDE: 92 mmol/L — AB (ref 98–110)
CO2: 27 mmol/L (ref 20–31)
Creat: 0.72 mg/dL (ref 0.50–1.10)
GFR, Est African American: >89 mL/min (ref >=60)
GFR, Est Non African American: >89 mL/min (ref >=60)
GLUCOSE: 98 mg/dL (ref 65–99)
POTASSIUM: 5.6 mmol/L — AB (ref 3.5–5.3)
SODIUM: 124 mmol/L — AB (ref 135–146)
Total Bilirubin: 0.3 mg/dL (ref 0.2–1.2)
Total Protein: 6.7 g/dL (ref 6.1–8.1)

## 2015-11-10 NOTE — Progress Notes (Signed)
Patient ID: Rachel Underwood, female   DOB: 12-15-1970, 44 y.o.   MRN: [id number]      RFV: hiv follow up  Patient ID: Rachel Underwood, female   DOB: 1971/06/03, 44 y.o.   MRN: [id number]  HPI  44yo F with well controlled hiv disease, also has migraine x 7d saw pcp for management yesterday. She has ha-bitemporal, n/v/, light sensitive, noise sensitivity.  She is not feeling well due to her migraine. Outpatient Encounter Prescriptions as of 11/10/2015  Medication Sig  . acyclovir (ZOVIRAX) 400 MG tablet Take 400 mg by mouth 2 (two) times daily as needed (for prophylaxis).  Marland Kitchen albuterol (PROVENTIL HFA;VENTOLIN HFA) 108 (90 BASE) MCG/ACT inhaler Inhale 2 puffs into the lungs every 4 (four) hours as needed for wheezing or shortness of breath.  Marland Kitchen atenolol (TENORMIN) 100 MG tablet Take 1 tablet (100 mg total) by mouth daily.  . cetirizine (ZYRTEC) 10 MG tablet Take 10 mg by mouth daily.  Marland Kitchen dexlansoprazole (DEXILANT) 60 MG capsule Take 1 capsule (60 mg total) by mouth daily.  Marland Kitchen elvitegravir-cobicistat-emtricitabine-tenofovir (STRIBILD) 150-150-200-300 MG TABS tablet Take 1 tablet by mouth daily with breakfast.  . EPIPEN 2-PAK 0.3 MG/0.3ML SOAJ injection Inject 0.3 mLs as directed once.  . hydrochlorothiazide (HYDRODIURIL) 25 MG tablet TAKE ONE TABLET BY MOUTH DAILY  . hydrocortisone (ANUSOL-HC) 2.5 % rectal cream Place 1 application rectally 2 (two) times daily.  . hydrOXYzine (ATARAX/VISTARIL) 50 MG tablet Take 50 mg by mouth every 8 (eight) hours as needed for nausea or vomiting.   . lubiprostone (AMITIZA) 24 MCG capsule Take 1 capsule (24 mcg total) by mouth 2 (two) times daily with a meal.  . ondansetron (ZOFRAN) 8 MG tablet Take 1 tablet (8 mg total) by mouth every 8 (eight) hours as needed for nausea or vomiting.  . OXcarbazepine (TRILEPTAL) 150 MG tablet Take 300 mg by mouth at bedtime.   Marland Kitchen oxyCODONE-acetaminophen (ROXICET) 5-325 MG/5ML solution Take 7.5 mg of oxycodone by mouth every 4 (four) hours as  needed for severe pain.   . Pitavastatin Calcium 4 MG TABS Take 4 mg by mouth at bedtime. Pitavastatin 4mg  or placebo  . sertraline (ZOLOFT) 100 MG tablet Take 200 mg by mouth every morning. Takes 2 daily  . topiramate (TOPAMAX) 50 MG tablet Take 1/2 tab daily for 8 days then take 1 pill dialy   No facility-administered encounter medications on file as of 11/10/2015.     Patient Active Problem List   Diagnosis Date Noted  . Headache 11/09/2015  . Chronic back pain 11/09/2015  . Diarrhea 05/19/2015  . Cysts of both ovaries 05/12/2015  . Abdominal wall hernia 02/03/2015  . Periumbilical abdominal pain 02/03/2015  . Abdominal wall mass of periumbilical region [id number]  . Schatzki's ring   . Diverticulosis of colon without hemorrhage   . Other hemorrhoids   . Rectal bleeding 12/11/2014  . GERD (gastroesophageal reflux disease) 12/11/2014  . Esophageal dysphagia 12/11/2014  . Constipation 12/11/2014  . Abdominal pain, generalized 12/11/2014  . S/P hysterectomy 09/01/2014  . Pelvic pain in female 07/22/2014  . Anovulation 06/18/2014  . DUB (dysfunctional uterine bleeding) 06/18/2014  . Abdominal wall pain 06/18/2014  . Menorrhagia with irregular cycle 06/03/2014  . Enlarged uterus 06/03/2014  . HTN (hypertension) 01/15/2014  . Influenza with other respiratory manifestations 01/07/2014  . Human immunodeficiency virus (HIV) disease ([place]) 07/17/2013  . Schizophrenia ([place]) 07/17/2013  . Bipolar disorder, unspecified ([place]) 07/17/2013     There are no preventive care reminders  to display for this patient.   Review of Systems 10 point reviewed, positive pertinents listed in hpi. No fever, chills, nightsweats, + HA Physical Exam   BP 117/82 mmHg  Pulse 51  Temp(Src) 98.1 F (36.7 C) (Oral)  Ht 5\' 4"  (1.626 m)  Wt 285 lb (129.275 kg)  BMI 48.90 kg/m2  LMP 08/11/2014 Physical Exam  Constitutional:  oriented to person, place, and time. appears well-developed and well-nourished.  Looks in discomfort HENT: [place]/AT, PERRLA, no scleral icterus Mouth/Throat: Oropharynx is clear and moist. No oropharyngeal exudate.  Cardiovascular: Normal rate, regular rhythm and normal heart sounds. Exam reveals no gallop and no friction rub.  No murmur heard.  Pulmonary/Chest: Effort normal and breath sounds normal. No respiratory distress.  has no wheezes.  Neck = supple, no nuchal rigidity Lymphadenopathy: no cervical adenopathy. No axillary adenopathy Neurological: alert and oriented to person, place, and time.  Skin: Skin is warm and dry. No rash noted. No erythema.  Psychiatric: a normal mood and affect.  behavior is normal.   Lab Results  Component Value Date   CD4TCELL 32* 04/22/2015   Lab Results  Component Value Date   CD4TABS 670 04/22/2015   CD4TABS 950 09/29/2014   CD4TABS 870 04/02/2014   Lab Results  Component Value Date   HIV1RNAQUANT <20 04/22/2015   Lab Results  Component Value Date   HEPBSAB POS* 07/15/2013   No results found for: RPR  CBC Lab Results  Component Value Date   WBC 8.1 06/16/2015   RBC 4.41 06/16/2015   HGB 14.0 06/16/2015   HCT 40.3 06/16/2015   PLT 169 06/16/2015   MCV 91.4 06/16/2015   MCH 31.7 06/16/2015   MCHC 34.7 06/16/2015   RDW 12.9 06/16/2015   LYMPHSABS 2.4 06/16/2015   MONOABS 0.5 06/16/2015   EOSABS 0.1 06/16/2015   BASOSABS 0.0 06/16/2015   BMET Lab Results  Component Value Date   NA 137 06/16/2015   K 3.8 06/16/2015   CL 108 06/16/2015   CO2 24 06/16/2015   GLUCOSE 109* 06/16/2015   BUN 10 06/16/2015   CREATININE 0.83 06/16/2015   CALCIUM 8.9 06/16/2015   GFRNONAA >60 06/16/2015   GFRAA >60 06/16/2015     Assessment and Plan  hiv disease = previously well controlled. No change in adherence. Will plan to ocntinue with current regimen and will get labs  Migraine = use prn zofran. Seen by PCP yesterday to make adjustments for migraine rescue.  schizophrenia = stable. Continue on current  regimen

## 2015-11-11 LAB — T-HELPER CELL (CD4) - (RCID CLINIC ONLY)
CD4 T CELL ABS: 770 /uL (ref 400–2700)
CD4 T CELL HELPER: 31 % — AB (ref 33–55)

## 2015-11-12 LAB — HIV-1 RNA QUANT-NO REFLEX-BLD
HIV 1 RNA Quant: <20 copies/mL (ref <20)
HIV-1 RNA Quant, Log: <1.3 Log copies/mL (ref <1.30)

## 2015-11-16 ENCOUNTER — Ambulatory Visit (INDEPENDENT_AMBULATORY_CARE_PROVIDER_SITE_OTHER): Payer: Medicaid Other | Admitting: Family Medicine

## 2015-11-16 ENCOUNTER — Encounter: Payer: Self-pay | Admitting: Family Medicine

## 2015-11-16 VITALS — BP 131/86 | HR 52 | Temp 98.1°F | Ht 64.0 in | Wt 283.2 lb

## 2015-11-16 DIAGNOSIS — A499 Bacterial infection, unspecified: Secondary | ICD-10-CM

## 2015-11-16 DIAGNOSIS — N898 Other specified noninflammatory disorders of vagina: Secondary | ICD-10-CM

## 2015-11-16 DIAGNOSIS — N760 Acute vaginitis: Secondary | ICD-10-CM | POA: Diagnosis not present

## 2015-11-16 DIAGNOSIS — B9689 Other specified bacterial agents as the cause of diseases classified elsewhere: Secondary | ICD-10-CM

## 2015-11-16 LAB — POCT WET PREP (WET MOUNT): KOH WET PREP POC: POSITIVE

## 2015-11-16 MED ORDER — METRONIDAZOLE 500 MG PO TABS: 500.0000 mg | ORAL_TABLET | Freq: Two times a day (BID) | ORAL | Status: DC

## 2015-11-16 NOTE — Patient Instructions (Signed)
Great to see you!  Do not drink alcohol while taking this medicine.   See the information I printed out for you for details

## 2015-11-16 NOTE — Progress Notes (Signed)
   HPI  Patient presents today her vaginal discharge.  Patient points that she's had increased vaginal discharge over the last 2 weeks. She denies itching, burning, or vaginal irritation. She's been sexually active with 1 partner in the last 6 months and has100% continent compliance considering that she is HIV positive and is very careful.  She denies fever, chills, sweats  PMH: Smoking status noted ROS: Per HPI  Objective: BP 131/86 mmHg  Pulse 52  Temp(Src) 98.1 F (36.7 C) (Oral)  Ht 5\' 4"  (1.626 m)  Wt 283 lb 3.2 oz (128.459 kg)  BMI 48.59 kg/m2  LMP 08/11/2014 Gen: NAD, alert, cooperative with exam HEENT: NCAT CV: RRR, good S1/S2, no murmur Resp: CTABL, no wheezes, non-labored Ext: No edema, warm Neuro: Alert and oriented, No gross deficits  Assessment and plan:  # bacterial vaginosis Treat with Flagyl Discussed usual course of illness and ways to decrease incidence   Orders Placed This Encounter  Procedures  . POCT Wet Prep [facility])    Meds ordered this encounter  Medications  . metroNIDAZOLE (FLAGYL) 500 MG tablet    Sig: Take 1 tablet (500 mg total) by mouth 2 (two) times daily.    Dispense:  14 tablet    Refill:  0    Laroy Apple, MD [facility] 11/16/2015, 5:06 PM

## 2015-11-18 ENCOUNTER — Telehealth: Payer: Self-pay | Admitting: Family Medicine

## 2015-11-18 ENCOUNTER — Encounter: Payer: Self-pay | Admitting: Gastroenterology

## 2015-11-18 ENCOUNTER — Ambulatory Visit (INDEPENDENT_AMBULATORY_CARE_PROVIDER_SITE_OTHER): Payer: Medicaid Other | Admitting: Gastroenterology

## 2015-11-18 VITALS — BP 122/60 | HR 53 | Temp 97.9°F | Ht 64.5 in | Wt 286.0 lb

## 2015-11-18 DIAGNOSIS — K2100 Gastro-esophageal reflux disease with esophagitis, without bleeding: Secondary | ICD-10-CM

## 2015-11-18 DIAGNOSIS — K5900 Constipation, unspecified: Secondary | ICD-10-CM | POA: Diagnosis not present

## 2015-11-18 MED ORDER — LINACLOTIDE 290 MCG PO CAPS
290.0000 ug | ORAL_CAPSULE | Freq: Every day | ORAL | Status: DC
Start: 2015-11-18 — End: 2016-11-30

## 2015-11-18 NOTE — Patient Instructions (Signed)
1. Stop Amitiza. Start Linzess 28mcg daily on empty stomach for constipation. RX sent to your pharmacy. 2. Return to the office in 6 months or call sooner if needed.

## 2015-11-18 NOTE — Assessment & Plan Note (Signed)
Continues to have hard stools, infrequent stools on Amitiza 24 g twice a day complains of bloating and discomfort. Has tried probiotics and yogurt without relief. Stop Amitiza. Trial of Linzess 285mcg daily. If she fails Linzess, then we should consider Movantik or Relistor in the setting of opioids.

## 2015-11-18 NOTE — Assessment & Plan Note (Signed)
Doing well on Dexilant. Continue anti-reflex measures. Return to the office in 6 months.

## 2015-11-18 NOTE — Telephone Encounter (Signed)
Patient advised that if she is allergic to latex then it is not advised to use latex condoms. She should continue using condoms but should not use latex.

## 2015-11-18 NOTE — Progress Notes (Signed)
Primary Care Physician: Kenn File, MD  Primary Gastroenterologist:  Garfield Cornea, MD   Chief Complaint  Patient presents with  . Follow-up    HPI: Rachel Underwood is a 44 y.o. female here for six-month follow-up. She has a history of GERD, nausea. Last seen in June 2016. Was having issues with diarrhea at that time, in the setting of baseline constipation, self-limiting.Since her last office visit we switched her from pantoprazole to [place] for poorly controlled reflux. EGD earlier this year back in January with erosive reflux esophagitis, chronic inactive gastritis but negative H pylori. Colonoscopy with anal canal hemorrhoids, you left-sided diverticula.  Heartburn well-controlled on Dexilant. No dysphagia. Vague abdominal pain and bloating. Complains of a lot of gas. BM still difficult. [place] 2-3. No melena, brbpr. BM every few days. On amitiza 21mcg bid. Failed probiotics. Tried twice. Eating yogurt again.   Current Outpatient Prescriptions  Medication Sig Dispense Refill  . acyclovir (ZOVIRAX) 400 MG tablet Take 400 mg by mouth 2 (two) times daily as needed (for prophylaxis).    Marland Kitchen albuterol (PROVENTIL HFA;VENTOLIN HFA) 108 (90 BASE) MCG/ACT inhaler Inhale 2 puffs into the lungs every 4 (four) hours as needed for wheezing or shortness of breath.    Marland Kitchen atenolol (TENORMIN) 100 MG tablet Take 1 tablet (100 mg total) by mouth daily. 30 tablet 6  . cetirizine (ZYRTEC) 10 MG tablet Take 10 mg by mouth daily.    Marland Kitchen dexlansoprazole (DEXILANT) 60 MG capsule Take 1 capsule (60 mg total) by mouth daily. 30 capsule 11  . elvitegravir-cobicistat-emtricitabine-tenofovir (STRIBILD) 150-150-200-300 MG TABS tablet Take 1 tablet by mouth daily with breakfast. 30 tablet 11  . EPIPEN 2-PAK 0.3 MG/0.3ML SOAJ injection Inject 0.3 mLs as directed once.  0  . hydrochlorothiazide (HYDRODIURIL) 25 MG tablet TAKE ONE TABLET BY MOUTH DAILY 30 tablet 0  . hydrocortisone (ANUSOL-HC) 2.5 % rectal cream  Place 1 application rectally 2 (two) times daily.    . hydrOXYzine (ATARAX/VISTARIL) 50 MG tablet Take 50 mg by mouth every 8 (eight) hours as needed for nausea or vomiting.     . lubiprostone (AMITIZA) 24 MCG capsule Take 1 capsule (24 mcg total) by mouth 2 (two) times daily with a meal. 60 capsule 5  . metroNIDAZOLE (FLAGYL) 500 MG tablet Take 1 tablet (500 mg total) by mouth 2 (two) times daily. 14 tablet 0  . ondansetron (ZOFRAN) 8 MG tablet Take 1 tablet (8 mg total) by mouth every 8 (eight) hours as needed for nausea or vomiting. 60 tablet 2  . OXcarbazepine (TRILEPTAL) 150 MG tablet Take 300 mg by mouth at bedtime.     Marland Kitchen oxyCODONE-acetaminophen (ROXICET) 5-325 MG/5ML solution Take 7.5 mg of oxycodone by mouth every 4 (four) hours as needed for severe pain.     . Pitavastatin Calcium 4 MG TABS Take 4 mg by mouth at bedtime. Pitavastatin 4mg  or placebo    . sertraline (ZOLOFT) 100 MG tablet Take 200 mg by mouth every morning. Takes 2 daily    . topiramate (TOPAMAX) 50 MG tablet Take 1/2 tab daily for 8 days then take 1 pill dialy 30 tablet 3   No current facility-administered medications for this visit.    Allergies as of 11/18/2015 - Review Complete 11/18/2015  Allergen Reaction Noted  . Naproxen Anaphylaxis and Hives 06/03/2014  . Piroxicam Anaphylaxis 05/12/2015  . Abilify [aripiprazole]  05/12/2015  . Citalopram Hives 05/12/2015  . Codeine Nausea And Vomiting 03/25/2014  . Depakote [divalproex  sodium] Swelling 05/12/2015  . Gabapentin  05/13/2014  . Haldol [haloperidol lactate] Other (See Comments) 03/25/2014  . Indomethacin Swelling 12/11/2014  . Invega [paliperidone] Other (See Comments) 06/10/2014  . Latuda [lurasidone hcl] Hives 05/12/2015  . Lithium Hives 06/03/2014  . Promethazine Swelling 02/03/2015  . Trazodone and nefazodone Other (See Comments) 12/18/2014  . Lexapro [escitalopram oxalate] Rash 07/15/2013    ROS:  General: Negative for anorexia, weight loss,  fever, chills, fatigue, weakness. ENT: Negative for hoarseness, difficulty swallowing , nasal congestion. CV: Negative for chest pain, angina, palpitations, dyspnea on exertion, peripheral edema.  Respiratory: Negative for dyspnea at rest, dyspnea on exertion, cough, sputum, wheezing.  GI: See history of present illness. GU:  Negative for dysuria, hematuria, urinary incontinence, urinary frequency, nocturnal urination.  Endo: Negative for unusual weight change.    Physical Examination:   BP 122/60 mmHg  Pulse 53  Temp(Src) 97.9 F (36.6 C) (Oral)  Ht 5' 4.5" (1.638 m)  Wt 286 lb (129.729 kg)  BMI 48.35 kg/m2  LMP 08/11/2014  General: Well-nourished, well-developed in no acute distress.  Eyes: No icterus. Mouth: Oropharyngeal mucosa moist and pink , no lesions erythema or exudate. Lungs: Clear to auscultation bilaterally.  Heart: Regular rate and rhythm, no murmurs rubs or gallops.  Abdomen: Bowel sounds are normal, nontender, nondistended, no hepatosplenomegaly or masses,  Extremities: No lower extremity edema. No clubbing or deformities. Neuro: Alert and oriented x 4   Skin: Warm and dry, no jaundice.   Psych: Alert and cooperative, normal mood and affect.  Labs:  Lab Results  Component Value Date   CREATININE 0.72 11/10/2015   BUN 7 11/10/2015   NA 124* 11/10/2015   K 5.6* 11/10/2015   CL 92* 11/10/2015   CO2 27 11/10/2015   Lab Results  Component Value Date   ALT 12 11/10/2015   AST 14 11/10/2015   ALKPHOS 67 11/10/2015   BILITOT 0.3 11/10/2015   Lab Results  Component Value Date   WBC 6.9 11/10/2015   HGB 13.5 11/10/2015   HCT 39.0 11/10/2015   MCV 94.9 11/10/2015   PLT 208 11/10/2015    Imaging Studies: No results found.

## 2015-11-19 ENCOUNTER — Other Ambulatory Visit: Payer: Self-pay | Admitting: Internal Medicine

## 2015-11-19 DIAGNOSIS — E875 Hyperkalemia: Secondary | ICD-10-CM

## 2015-11-19 NOTE — Progress Notes (Signed)
CC'ED TO PCP 

## 2015-11-19 NOTE — Progress Notes (Signed)
Wanted her to repeat BMP to see if hyperkalemia.

## 2015-11-22 ENCOUNTER — Other Ambulatory Visit: Payer: Medicaid Other

## 2015-11-22 DIAGNOSIS — E875 Hyperkalemia: Secondary | ICD-10-CM

## 2015-11-23 ENCOUNTER — Telehealth: Payer: Self-pay | Admitting: Family Medicine

## 2015-11-23 LAB — BASIC METABOLIC PANEL
BUN: 9 mg/dL (ref 7–25)
CALCIUM: 8.9 mg/dL (ref 8.6–10.2)
CO2: 25 mmol/L (ref 20–31)
CREATININE: 0.69 mg/dL (ref 0.50–1.10)
Chloride: 102 mmol/L (ref 98–110)
Glucose, Bld: 66 mg/dL — ABNORMAL LOW (ref 70–99)
Potassium: 4.7 mmol/L (ref 3.5–5.3)
SODIUM: 129 mmol/L — AB (ref 135–146)

## 2015-11-23 LAB — CORTISOL: Cortisol, Plasma: 13.6 ug/dL

## 2015-11-23 NOTE — Telephone Encounter (Signed)
Patient advised that she would need to be seen. She was advised that she can try warm compresses and see if that helps any.

## 2015-11-25 ENCOUNTER — Telehealth: Payer: Self-pay | Admitting: Family Medicine

## 2015-11-25 MED ORDER — METRONIDAZOLE 0.75 % VA GEL: 1.0000 | Freq: Two times a day (BID) | VAGINAL | Status: DC

## 2015-11-25 NOTE — Telephone Encounter (Signed)
Pt aware.

## 2015-11-25 NOTE — Telephone Encounter (Signed)
Try metrogel, if no resolution will need a follow up with pelvic exam.   Laroy Apple, MD [facility] 11/25/2015, 9:47 AM

## 2015-11-25 NOTE — Telephone Encounter (Signed)
Pt has finished all of the Flagyl - now still having itching and discharge.  Could she have something else called in?

## 2015-11-26 ENCOUNTER — Telehealth: Payer: Self-pay | Admitting: Family Medicine

## 2015-11-26 MED ORDER — FLUCONAZOLE 150 MG PO TABS: ORAL_TABLET | ORAL | Status: DC

## 2015-11-26 NOTE — Telephone Encounter (Signed)
Pt aware.

## 2015-11-26 NOTE — Telephone Encounter (Signed)
Treating for yeast infection, last Wet prep looked like BV but patient concernd as she still has symptoms.   Laroy Apple, MD [facility] 11/26/2015, 12:03 PM

## 2015-11-30 ENCOUNTER — Other Ambulatory Visit: Payer: Self-pay | Admitting: Family Medicine

## 2015-12-01 ENCOUNTER — Other Ambulatory Visit: Payer: Self-pay | Admitting: *Deleted

## 2015-12-01 ENCOUNTER — Telehealth: Payer: Self-pay | Admitting: Family Medicine

## 2015-12-01 ENCOUNTER — Other Ambulatory Visit: Payer: Self-pay | Admitting: Family Medicine

## 2015-12-01 MED ORDER — VARENICLINE TARTRATE 1 MG PO TABS: 1.0000 mg | ORAL_TABLET | Freq: Two times a day (BID) | ORAL | Status: DC

## 2015-12-01 MED ORDER — VARENICLINE TARTRATE 0.5 MG X 11 & 1 MG X 42 PO MISC: ORAL | Status: DC

## 2015-12-01 NOTE — Telephone Encounter (Signed)
No message needed °

## 2015-12-01 NOTE — Telephone Encounter (Signed)
Rachel Underwood patient

## 2015-12-01 NOTE — Telephone Encounter (Signed)
Chantix scripts sent to pharmacy and patient aware.

## 2015-12-01 NOTE — Telephone Encounter (Signed)
Refill as requested, 3 mos

## 2015-12-03 ENCOUNTER — Other Ambulatory Visit: Payer: Self-pay | Admitting: Family Medicine

## 2015-12-03 DIAGNOSIS — Z1231 Encounter for screening mammogram for malignant neoplasm of breast: Secondary | ICD-10-CM

## 2015-12-09 ENCOUNTER — Encounter: Payer: Self-pay | Admitting: *Deleted

## 2015-12-09 ENCOUNTER — Encounter (INDEPENDENT_AMBULATORY_CARE_PROVIDER_SITE_OTHER): Payer: Self-pay | Admitting: *Deleted

## 2015-12-09 VITALS — BP 136/89 | HR 49 | Temp 97.7°F | Resp 16 | Wt 279.2 lb

## 2015-12-09 DIAGNOSIS — Z006 Encounter for examination for normal comparison and control in clinical research program: Secondary | ICD-10-CM

## 2015-12-09 NOTE — Progress Notes (Signed)
[id number] - Randomized Trial to prevent Vascular Events in HIV (REPRIEVE study).  Med: Pitavastatin/Placebo 4mg  PO Daily Duration: 3.5 - 6 years.    Rachel Underwood is here for A5332, month 20. She has been adherent to her meds. #38 pills remain. She has switch back to her STRIBILD. Since the last time I saw her, her son has been placed in jail. He will be participating in a drug rehab program and she feels this is the best case for her son. He will be in jail for 3 years. She was given $50 gift card for study visits and next appointment is scheduled for Apr 24, 2016 @ 2pm. Eliezer Champagne RN

## 2015-12-10 ENCOUNTER — Ambulatory Visit: Payer: Medicaid Other | Admitting: Family Medicine

## 2015-12-23 ENCOUNTER — Encounter: Payer: Self-pay | Admitting: Cardiovascular Disease

## 2015-12-23 ENCOUNTER — Ambulatory Visit (INDEPENDENT_AMBULATORY_CARE_PROVIDER_SITE_OTHER): Payer: Medicaid Other | Admitting: Cardiovascular Disease

## 2015-12-23 VITALS — BP 128/72 | HR 63 | Ht 64.5 in | Wt 275.0 lb

## 2015-12-23 DIAGNOSIS — R002 Palpitations: Secondary | ICD-10-CM | POA: Diagnosis not present

## 2015-12-23 DIAGNOSIS — I10 Essential (primary) hypertension: Secondary | ICD-10-CM

## 2015-12-23 DIAGNOSIS — I471 Supraventricular tachycardia: Secondary | ICD-10-CM

## 2015-12-23 MED ORDER — ATENOLOL 100 MG PO TABS: 100.0000 mg | ORAL_TABLET | Freq: Every day | ORAL | Status: DC

## 2015-12-23 NOTE — Progress Notes (Signed)
Patient ID: Rachel Underwood, female   DOB: [birth year], 45 y.o.   MRN: [id number]      SUBJECTIVE: The patient presents for follow-up of palpitations and PSVT as well as hypertension. She has a history of hypertension, HIV, and bipolar disorder, and a strong family history of premature coronary artery disease.  She has struggled with palpitations since childhood. She also has a lot of stress in her life. She has a son in prison. She also has difficulties with her daughter. She is interested in seeing electrophysiology for possible SVT ablation.   Review of Systems: As per "subjective", otherwise negative.  Allergies  Allergen Reactions  . Naproxen Anaphylaxis and Hives  . Piroxicam Anaphylaxis  . Abilify [Aripiprazole]   . Citalopram Hives  . Codeine Nausea And Vomiting  . Depakote [Divalproex Sodium] Swelling  . Gabapentin     Hives   . Haldol [Haloperidol Lactate] Other (See Comments)    Slurs speech and "stumble"  . Indomethacin Swelling    Dizzy, tigling of face  . Invega [Paliperidone] Other (See Comments)    Slurred speech, drooling  . Latuda [Lurasidone Hcl] Hives  . Lithium Hives  . Promethazine Swelling    Arms and hands  . Trazodone And Nefazodone Other (See Comments)    Dry mouth, congestion, severe dryness.  [name] [Escitalopram Oxalate] Rash    Current Outpatient Prescriptions  Medication Sig Dispense Refill  . acyclovir (ZOVIRAX) 400 MG tablet Take 400 mg by mouth 2 (two) times daily as needed (for prophylaxis).    Marland Kitchen albuterol (PROVENTIL HFA;VENTOLIN HFA) 108 (90 BASE) MCG/ACT inhaler Inhale 2 puffs into the lungs every 4 (four) hours as needed for wheezing or shortness of breath.    Marland Kitchen atenolol (TENORMIN) 100 MG tablet Take 1 tablet (100 mg total) by mouth daily. 30 tablet 6  . dexlansoprazole (DEXILANT) 60 MG capsule Take 1 capsule (60 mg total) by mouth daily. 30 capsule 11  . elvitegravir-cobicistat-emtricitabine-tenofovir (STRIBILD) 150-150-200-300 MG TABS  tablet Take 1 tablet by mouth daily with breakfast. 30 tablet 11  . EPIPEN 2-PAK 0.3 MG/0.3ML SOAJ injection Inject 0.3 mLs as directed once.  0  . hydrochlorothiazide (HYDRODIURIL) 25 MG tablet TAKE ONE TABLET BY MOUTH DAILY 30 tablet 0  . hydrOXYzine (ATARAX/VISTARIL) 50 MG tablet Take 50 mg by mouth every 8 (eight) hours as needed for nausea or vomiting.     . Linaclotide (LINZESS) 290 MCG CAPS capsule Take 1 capsule (290 mcg total) by mouth daily. On empty stomach 30 capsule 11  . ondansetron (ZOFRAN) 8 MG tablet Take 1 tablet (8 mg total) by mouth every 8 (eight) hours as needed for nausea or vomiting. 60 tablet 2  . OXcarbazepine (TRILEPTAL) 150 MG tablet Take 300 mg by mouth at bedtime.     . Pitavastatin Calcium 4 MG TABS Take 4 mg by mouth at bedtime. Pitavastatin 4mg  or placebo    . sertraline (ZOLOFT) 100 MG tablet Take 200 mg by mouth every morning. Takes 2 daily    . topiramate (TOPAMAX) 50 MG tablet Take 1/2 tab daily for 8 days then take 1 pill dialy 30 tablet 3  . varenicline (CHANTIX CONTINUING MONTH PAK) 1 MG tablet Take 1 tablet (1 mg total) by mouth 2 (two) times daily. 60 tablet 1   No current facility-administered medications for this visit.    Past Medical History  Diagnosis Date  . Depression   . HIV (human immunodeficiency virus infection) ([place])   . Hypertension   .  Asthma   . Borderline personality disorder   . Menorrhagia with irregular cycle 06/03/2014  . Dysmenorrhea 06/03/2014  . Enlarged uterus 06/03/2014  . Herpes simplex without mention of complication   . Abnormal Pap smear of cervix 2005(EST)    ckc for abnl pap- [facility] [place]  . GERD (gastroesophageal reflux disease)   . Seizures ([place])     had as a teenager, unknown etiology and no meds.  . Fibromyalgia   . Osteoarthritis   . Allergy   . Hyperlipidemia   . Bipolar 1 disorder ([place])   . BPD (bronchopulmonary dysplasia)     Past Surgical History  Procedure Laterality Date  . Ankle  surgery Right     repair of ligaments  . Back surgery    . Shoulder surgery Right     rotator cuff  . Cesarean section    . Ckc    . Abdominal hysterectomy N/A 09/01/2014    Procedure: HYSTERECTOMY ABDOMINAL;  Surgeon: Jonnie Kind, MD;  Location: [facility];  Service: Gynecology;  Laterality: N/A;  . Scar revision N/A 09/01/2014    Procedure: EXCISION OF CICATRIX;  Surgeon: Jonnie Kind, MD;  Location: [facility];  Service: Gynecology;  Laterality: N/A;  . Bilateral salpingectomy Bilateral 09/01/2014    Procedure: BILATERAL SALPINGECTOMY;  Surgeon: Jonnie Kind, MD;  Location: [facility];  Service: Gynecology;  Laterality: Bilateral;  . Excision of skin tag N/A 09/01/2014    Procedure: EXCISION OF SKIN NEVUS;  Surgeon: Jonnie Kind, MD;  Location: [facility];  Service: Gynecology;  Laterality: N/A;  . Colonoscopy N/A 12/23/2014    [id number] canal hemorrhoids left-sided diverticula  . Esophagogastroduodenoscopy N/A 12/23/2014    [id number] reflux/noncritical shatzkis ring s/p dilation/small HH  . Savory dilation N/A 12/23/2014    Procedure: SAVORY DILATION;  Surgeon: Daneil Dolin, MD;  Location: [facility];  Service: Endoscopy;  Laterality: N/A;  Venia Minks dilation N/A 12/23/2014    Procedure: Venia Minks DILATION;  Surgeon: Daneil Dolin, MD;  Location: [facility];  Service: Endoscopy;  Laterality: N/A;  . Fracture surgery    . Spine surgery      Social History   Social History  . Marital Status: Divorced    Spouse Name: N/A  . Number of Children: 3  . Years of Education: N/A   Occupational History  . Not on file.   Social History Main Topics  . Smoking status: Current Every Day Smoker -- 0.50 packs/day for 30 years    Types: Cigarettes    Start date: 12/04/1982  . Smokeless tobacco: Never Used  . Alcohol Use: No  . 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 Sexual Activity:    Partners: Male    Birth Control/ Protection: Condom    Other Topics Concern  . Not on file   Social History Narrative     Filed Vitals:   12/23/15 1034  BP: 128/72  Pulse: 63  Height: 5' 4.5" (1.638 m)  Weight: 275 lb (124.739 kg)  SpO2: 95%    PHYSICAL EXAM General: NAD HEENT: Normal. Neck: No JVD, no thyromegaly. Lungs: Clear to auscultation bilaterally with normal respiratory effort. CV: Nondisplaced PMI.  Regular rate and rhythm, normal S1/S2, no S3/S4, no murmur. Trace pretibial and periankle edema.  No carotid bruit.   Abdomen: Soft, obese  Neurologic: Alert and oriented x 3.  Psych: Normal affect.  Skin: Normal. Musculoskeletal: No gross deformities. Extremities: No clubbing or cyanosis.   ECG: Most recent ECG reviewed.      ASSESSMENT AND PLAN: 1. Palpitations/PSVT: Currently on atenolol. Continues to struggle with palpitations. Wants to see if she is a candidate for ablation. I will make an EP referral.  2. Essential HTN: Controlled on current dose of atenolol. No changes. Needs weight loss.  Dispo: f/u prn with me. Will make EP referral.   Kate Sable, M.D., F.A.C.C.

## 2015-12-23 NOTE — Patient Instructions (Signed)
Your physician recommends that you schedule a follow-up appointment  As needed with Dr.Koneswaran  You have been referred to Electrophysiologist Dr.Taylor   Your physician recommends that you continue on your current medications as directed. Please refer to the Current Medication list given to you today.      Thank you for choosing [facility] !

## 2015-12-31 ENCOUNTER — Ambulatory Visit: Payer: Medicaid Other | Admitting: Family Medicine

## 2016-01-03 ENCOUNTER — Encounter: Payer: Self-pay | Admitting: Family Medicine

## 2016-01-03 ENCOUNTER — Ambulatory Visit (INDEPENDENT_AMBULATORY_CARE_PROVIDER_SITE_OTHER): Payer: Medicaid Other | Admitting: Family Medicine

## 2016-01-03 VITALS — BP 110/72 | HR 73 | Temp 98.2°F | Ht 64.5 in | Wt 279.4 lb

## 2016-01-03 DIAGNOSIS — R300 Dysuria: Secondary | ICD-10-CM

## 2016-01-03 DIAGNOSIS — N898 Other specified noninflammatory disorders of vagina: Secondary | ICD-10-CM

## 2016-01-03 DIAGNOSIS — Z720 Tobacco use: Secondary | ICD-10-CM | POA: Diagnosis not present

## 2016-01-03 DIAGNOSIS — L293 Anogenital pruritus, unspecified: Secondary | ICD-10-CM

## 2016-01-03 DIAGNOSIS — L989 Disorder of the skin and subcutaneous tissue, unspecified: Secondary | ICD-10-CM | POA: Diagnosis not present

## 2016-01-03 DIAGNOSIS — R42 Dizziness and giddiness: Secondary | ICD-10-CM

## 2016-01-03 LAB — POCT UA - MICROSCOPIC ONLY
CASTS, UR, LPF, POC: NEGATIVE
CRYSTALS, UR, HPF, POC: NEGATIVE
Mucus, UA: NEGATIVE
Yeast, UA: NEGATIVE

## 2016-01-03 LAB — POCT URINALYSIS DIPSTICK
BILIRUBIN UA: NEGATIVE
GLUCOSE UA: NEGATIVE
KETONES UA: NEGATIVE
LEUKOCYTES UA: NEGATIVE
Nitrite, UA: NEGATIVE
Protein, UA: NEGATIVE
RBC UA: NEGATIVE
Spec Grav, UA: >=1.03
Urobilinogen, UA: NEGATIVE
pH, UA: 5

## 2016-01-03 LAB — POCT WET PREP (WET MOUNT): Trichomonas Wet Prep HPF POC: NEGATIVE

## 2016-01-03 MED ORDER — FLUCONAZOLE 150 MG PO TABS: 150.0000 mg | ORAL_TABLET | Freq: Once | ORAL | Status: DC

## 2016-01-03 MED ORDER — VARENICLINE TARTRATE 1 MG PO TABS: 1.0000 mg | ORAL_TABLET | Freq: Two times a day (BID) | ORAL | Status: DC

## 2016-01-03 MED ORDER — TOPIRAMATE 50 MG PO TABS: 50.0000 mg | ORAL_TABLET | Freq: Two times a day (BID) | ORAL | Status: DC

## 2016-01-03 MED ORDER — NYSTATIN 100000 UNIT/GM EX CREA: 1.0000 "application " | TOPICAL_CREAM | Freq: Two times a day (BID) | CUTANEOUS | Status: DC

## 2016-01-03 NOTE — Patient Instructions (Signed)
Great to see you!  Try the exercises for vertigo (BPPV)  Use the cream for the itching and burning  Benign Positional Vertigo Vertigo is the feeling that you or your surroundings are moving when they are not. Benign positional vertigo is the most common form of vertigo. The cause of this condition is not serious (is benign). This condition is triggered by certain movements and positions (is positional). This condition can be dangerous if it occurs while you are doing something that could endanger you or others, such as driving.  CAUSES In many cases, the cause of this condition is not known. It may be caused by a disturbance in an area of the inner ear that helps your brain to sense movement and balance. This disturbance can be caused by a viral infection (labyrinthitis), head injury, or repetitive motion. RISK FACTORS This condition is more likely to develop in:  Women.  People who are 79 years of age or older. SYMPTOMS Symptoms of this condition usually happen when you move your head or your eyes in different directions. Symptoms may start suddenly, and they usually last for less than a minute. Symptoms may include:  Loss of balance and falling.  Feeling like you are spinning or moving.  Feeling like your surroundings are spinning or moving.  Nausea and vomiting.  Blurred vision.  Dizziness.  Involuntary eye movement (nystagmus). Symptoms can be mild and cause only slight annoyance, or they can be severe and interfere with daily life. Episodes of benign positional vertigo may return (recur) over time, and they may be triggered by certain movements. Symptoms may improve over time. DIAGNOSIS This condition is usually diagnosed by medical history and a physical exam of the head, neck, and ears. You may be referred to a health care provider who specializes in ear, nose, and throat (ENT) problems (otolaryngologist) or a provider who specializes in disorders of the nervous system  (neurologist). You may have additional testing, including:  MRI.  A CT scan.  Eye movement tests. Your health care provider may ask you to change positions quickly while he or she watches you for symptoms of benign positional vertigo, such as nystagmus. Eye movement may be tested with an electronystagmogram (ENG), caloric stimulation, the Dix-Hallpike test, or the roll test.  An electroencephalogram (EEG). This records electrical activity in your brain.  Hearing tests. TREATMENT Usually, your health care provider will treat this by moving your head in specific positions to adjust your inner ear back to normal. Surgery may be needed in severe cases, but this is rare. In some cases, benign positional vertigo may resolve on its own in 2-4 weeks. HOME CARE INSTRUCTIONS Safety  Move slowly.Avoid sudden body or head movements.  Avoid driving.  Avoid operating heavy machinery.  Avoid doing any tasks that would be dangerous to you or others if a vertigo episode would occur.  If you have trouble walking or keeping your balance, try using a cane for stability. If you feel dizzy or unstable, sit down right away.  Return to your normal activities as told by your health care provider. Ask your health care provider what activities are safe for you. General Instructions  Take over-the-counter and prescription medicines only as told by your health care provider.  Avoid certain positions or movements as told by your health care provider.  Drink enough fluid to keep your urine clear or pale yellow.  Keep all follow-up visits as told by your health care provider. This is important. [place]  IF:  You have a fever.  Your condition gets worse or you develop new symptoms.  Your family or friends notice any behavioral changes.  Your nausea or vomiting gets worse.  You have numbness or a "pins and needles" sensation. SEEK IMMEDIATE MEDICAL CARE IF:  You have difficulty speaking or  moving.  You are always dizzy.  You faint.  You develop severe headaches.  You have weakness in your legs or arms.  You have changes in your hearing or vision.  You develop a stiff neck.  You develop sensitivity to light.   This information is not intended to replace advice given to you by your health care provider. Make sure you discuss any questions you have with your health care provider.   Document Released: 08/28/2006 Document Revised: 08/11/2015 Document Reviewed: 03/15/2015 Elsevier Interactive Patient Education [employer].

## 2016-01-03 NOTE — Progress Notes (Signed)
   HPI  Patient presents today 30 discussed dizziness and vaginal discharge.  Dizziness Patient explained room spinning sensation and dizziness intermittently for aboutone year. She states that it seems to be getting worse lately. This has caused her to falll landing on her right shoulder causing an required surgery.  Dysuria, vaginal discharge Patient notes perineall itching and irritation, slight white colored vaginal discharge, and dysuria described as warm sensation over the last week or so. She is treated last month for bacterial vaginosis  with improvement. She denies fever, chills, sweats  Smoking cessation, she is doing well with Chantix, requests refill   PMH: Smoking status noted ROS: Per HPI  Objective: BP 110/72 mmHg  Pulse 73  Temp(Src) 98.2 F (36.8 C) (Oral)  Ht 5' 4.5" (1.638 m)  Wt 279 lb 6.4 oz (126.735 kg)  BMI 47.24 kg/m2  LMP 08/11/2014 Gen: NAD, alert, cooperative with exam HEENT: NCAT CV: RRR, good S1/S2, no murmur Resp: CTABL, no wheezes, non-labored Ext: No edema, warm  Assessment and plan:  # Dizziness BPPV from Hx None today Discussed epley's maneuvers and watched video, handout given as well   # Perineal irritation Suspect external candidalk infection Nystatin cream  # Smoking cessation Doing well on chantix Refilled    Orders Placed This Encounter  Procedures  . POCT urinalysis dipstick  . POCT UA - Microscopic Only  . POCT Wet Prep [facility])    Meds ordered this encounter  Medications  . topiramate (TOPAMAX) 50 MG tablet    Sig: Take 1 tablet (50 mg total) by mouth 2 (two) times daily. Take 1/2 tab daily for 8 days then take 1 pill dialy    Dispense:  60 tablet    Refill:  3  . varenicline (CHANTIX CONTINUING MONTH PAK) 1 MG tablet    Sig: Take 1 tablet (1 mg total) by mouth 2 (two) times daily.    Dispense:  60 tablet    Refill:  1  . fluconazole (DIFLUCAN) 150 MG tablet    Sig: Take 1 tablet (150 mg total) by  mouth once.    Dispense:  1 tablet    Refill:  0  . nystatin cream (MYCOSTATIN)    Sig: Apply 1 application topically 2 (two) times daily.    Dispense:  30 g    Refill:  [place], MD [facility] 01/03/2016, 11:19 AM

## 2016-01-27 ENCOUNTER — Other Ambulatory Visit: Payer: Self-pay | Admitting: *Deleted

## 2016-01-27 MED ORDER — ACYCLOVIR 400 MG PO TABS: 400.0000 mg | ORAL_TABLET | Freq: Two times a day (BID) | ORAL | Status: DC | PRN

## 2016-01-28 ENCOUNTER — Ambulatory Visit (HOSPITAL_COMMUNITY)
Admission: RE | Admit: 2016-01-28 | Discharge: 2016-01-28 | Disposition: A | Discharge status: Home / Self Care | Payer: Medicaid Other | Source: Ambulatory Visit | Attending: Family Medicine | Admitting: Family Medicine

## 2016-01-28 DIAGNOSIS — Z1231 Encounter for screening mammogram for malignant neoplasm of breast: Secondary | ICD-10-CM | POA: Insufficient documentation

## 2016-02-03 ENCOUNTER — Ambulatory Visit: Payer: Medicaid Other | Admitting: Internal Medicine

## 2016-02-08 ENCOUNTER — Encounter: Payer: Self-pay | Admitting: Internal Medicine

## 2016-02-08 ENCOUNTER — Ambulatory Visit (INDEPENDENT_AMBULATORY_CARE_PROVIDER_SITE_OTHER): Payer: Medicaid Other | Admitting: Internal Medicine

## 2016-02-08 VITALS — BP 110/79 | HR 59 | Temp 98.2°F | Wt 279.0 lb

## 2016-02-08 DIAGNOSIS — B20 Human immunodeficiency virus [HIV] disease: Secondary | ICD-10-CM | POA: Diagnosis not present

## 2016-02-08 DIAGNOSIS — G4730 Sleep apnea, unspecified: Secondary | ICD-10-CM

## 2016-02-08 DIAGNOSIS — R110 Nausea: Secondary | ICD-10-CM

## 2016-02-08 NOTE — Progress Notes (Signed)
Patient ID: Rachel Underwood, female   DOB: 01/20/1971, 45 y.o.   MRN: [id number]      RFV: follow up for HIV disease Patient ID: Rachel Underwood, female   DOB: [birth year], 45 y.o.   MRN: [id number]  HPI 45yo F with HIv disease, schizophrenia. CD 4 count of 770/VL<20 currently on stribild doing well with her current regimen with adherence, though in the last 2 wks she has noticed increasing nausea still persists despite premedicating with zofran. She also has not had any other changes except for taking medicine for sleep..  She reports deep snoring, feeling fatigue in the daytime, easily nods off to sleep during the day  Constipation = takes linzess to help with symptoms  Outpatient Encounter Prescriptions as of 02/08/2016  Medication Sig  . acyclovir (ZOVIRAX) 400 MG tablet Take 1 tablet (400 mg total) by mouth 2 (two) times daily as needed (for prophylaxis).  Marland Kitchen albuterol (PROVENTIL HFA;VENTOLIN HFA) 108 (90 BASE) MCG/ACT inhaler Inhale 2 puffs into the lungs every 4 (four) hours as needed for wheezing or shortness of breath.  Marland Kitchen atenolol (TENORMIN) 100 MG tablet Take 1 tablet (100 mg total) by mouth daily.  Marland Kitchen dexlansoprazole (DEXILANT) 60 MG capsule Take 1 capsule (60 mg total) by mouth daily.  Marland Kitchen elvitegravir-cobicistat-emtricitabine-tenofovir (STRIBILD) 150-150-200-300 MG TABS tablet Take 1 tablet by mouth daily with breakfast.  . EPIPEN 2-PAK 0.3 MG/0.3ML SOAJ injection Inject 0.3 mLs as directed once.  . hydrochlorothiazide (HYDRODIURIL) 25 MG tablet TAKE ONE TABLET BY MOUTH DAILY  . hydrOXYzine (ATARAX/VISTARIL) 50 MG tablet Take 50 mg by mouth every 8 (eight) hours as needed for nausea or vomiting.   . Linaclotide (LINZESS) 290 MCG CAPS capsule Take 1 capsule (290 mcg total) by mouth daily. On empty stomach  . nystatin cream (MYCOSTATIN) Apply 1 application topically 2 (two) times daily.  . ondansetron (ZOFRAN) 8 MG tablet Take 1 tablet (8 mg total) by mouth every 8 (eight) hours as needed for nausea  or vomiting.  . OXcarbazepine (TRILEPTAL) 150 MG tablet Take 300 mg by mouth 2 (two) times daily.   . Pitavastatin Calcium 4 MG TABS Take 4 mg by mouth at bedtime. Pitavastatin 4mg  or placebo  . sertraline (ZOLOFT) 100 MG tablet Take 250 mg by mouth every morning. Takes 2 daily  . topiramate (TOPAMAX) 50 MG tablet Take 1 tablet (50 mg total) by mouth 2 (two) times daily.  . varenicline (CHANTIX CONTINUING MONTH PAK) 1 MG tablet Take 1 tablet (1 mg total) by mouth 2 (two) times daily.   No facility-administered encounter medications on file as of 02/08/2016.     Patient Active Problem List   Diagnosis Date Noted  . Headache 11/09/2015  . Chronic back pain 11/09/2015  . Diarrhea 05/19/2015  . Cysts of both ovaries 05/12/2015  . Abdominal wall hernia 02/03/2015  . Periumbilical abdominal pain 02/03/2015  . Abdominal wall mass of periumbilical region [id number]  . Schatzki's ring   . Diverticulosis of colon without hemorrhage   . Other hemorrhoids   . Rectal bleeding 12/11/2014  . GERD (gastroesophageal reflux disease) 12/11/2014  . Esophageal dysphagia 12/11/2014  . Constipation 12/11/2014  . Abdominal pain, generalized 12/11/2014  . S/P hysterectomy 09/01/2014  . Pelvic pain in female 07/22/2014  . Anovulation 06/18/2014  . DUB (dysfunctional uterine bleeding) 06/18/2014  . Abdominal wall pain 06/18/2014  . Menorrhagia with irregular cycle 06/03/2014  . Enlarged uterus 06/03/2014  . HTN (hypertension) 01/15/2014  . Influenza with other respiratory manifestations 01/07/2014  .  Human immunodeficiency virus (HIV) disease ([place]) 07/17/2013  . Schizophrenia ([place]) 07/17/2013  . Bipolar disorder, unspecified ([place]) 07/17/2013     There are no preventive care reminders to display for this patient.   Review of Systems Per hpi  Physical Exam   Temp(Src) 98.2 F (36.8 C) (Oral)  Wt 279 lb (126.554 kg)  LMP 08/11/2014 Physical Exam  Constitutional:  oriented to person, place, and  time. appears well-developed and well-nourished. No distress.  HENT: [place]/AT, PERRLA, no scleral icterus Mouth/Throat: Oropharynx is clear and moist. No oropharyngeal exudate.  Cardiovascular: Normal rate, regular rhythm and normal heart sounds. Exam reveals no gallop and no friction rub.  No murmur heard.  Pulmonary/Chest: Effort normal and breath sounds normal. No respiratory distress.  has no wheezes.  Neck = supple, no nuchal rigidity Abdominal: Soft. Bowel sounds are normal.  exhibits no distension. There is no tenderness.  Lymphadenopathy: no cervical adenopathy. No axillary adenopathy Neurological: alert and oriented to person, place, and time.  Skin: Skin is warm and dry. No rash noted. No erythema.  Psychiatric: a normal mood and affect.  behavior is normal.   Lab Results  Component Value Date   CD4TCELL 31* 11/10/2015   Lab Results  Component Value Date   CD4TABS 770 11/10/2015   CD4TABS 670 04/22/2015   CD4TABS 950 09/29/2014   Lab Results  Component Value Date   HIV1RNAQUANT <20 11/10/2015   Lab Results  Component Value Date   HEPBSAB POS* 07/15/2013   No results found for: RPR  CBC Lab Results  Component Value Date   WBC 6.9 11/10/2015   RBC 4.11 11/10/2015   HGB 13.5 11/10/2015   HCT 39.0 11/10/2015   PLT 208 11/10/2015   MCV 94.9 11/10/2015   MCH 32.8 11/10/2015   MCHC 34.6 11/10/2015   RDW 13.4 11/10/2015   LYMPHSABS 2.5 11/10/2015   MONOABS 0.6 11/10/2015   EOSABS 0.1 11/10/2015   BASOSABS 0.0 11/10/2015   BMET Lab Results  Component Value Date   NA 129* 11/22/2015   K 4.7 11/22/2015   CL 102 11/22/2015   CO2 25 11/22/2015   GLUCOSE 66* 11/22/2015   BUN 9 11/22/2015   CREATININE 0.69 11/22/2015   CALCIUM 8.9 11/22/2015   GFRNONAA >89 11/10/2015   GFRAA >89 11/10/2015     Assessment and Plan  hiv disease = well controlled, though she has nausea. Will try to move to night time to see if it improves symptoms of nausea  Nausea =continue  to premedicate  Daytime sleepiness =concern for sleep apnea. Will order sleep study

## 2016-02-09 ENCOUNTER — Telehealth: Payer: Self-pay

## 2016-02-09 NOTE — Telephone Encounter (Signed)
Pt is aware and she will call her ID doctor

## 2016-02-09 NOTE — Telephone Encounter (Signed)
Dexilant is a dual release drug, part of the medication is release immediately and then additional dose several hours later. Really not one that we should increase to twice daily.  If okay with her ID doctor, I would recommend continue Dexilant before breakfast and then take Zantac 150mg  at bedtime.  We can call in RX once she has that approved. Some of these medications can interfere with antiviral medications.

## 2016-02-09 NOTE — Telephone Encounter (Signed)
Pt is calling to see if her Dexilant could be increased to BID. Her reflux is waking her up at night.

## 2016-02-14 ENCOUNTER — Encounter: Payer: Self-pay | Admitting: Internal Medicine

## 2016-02-14 ENCOUNTER — Ambulatory Visit (INDEPENDENT_AMBULATORY_CARE_PROVIDER_SITE_OTHER): Payer: Medicaid Other | Admitting: Internal Medicine

## 2016-02-14 VITALS — BP 128/94 | HR 54 | Ht 64.5 in | Wt 279.8 lb

## 2016-02-14 DIAGNOSIS — R002 Palpitations: Secondary | ICD-10-CM

## 2016-02-14 DIAGNOSIS — I10 Essential (primary) hypertension: Secondary | ICD-10-CM | POA: Diagnosis not present

## 2016-02-14 MED ORDER — FLECAINIDE ACETATE 50 MG PO TABS: 50.0000 mg | ORAL_TABLET | Freq: Two times a day (BID) | ORAL | Status: DC

## 2016-02-14 NOTE — Patient Instructions (Addendum)
Medication Instructions:  Your physician has recommended you make the following change in your medication:  1) START Flecainide 50 mg by mouth TWICE daily    Labwork: none  Testing/Procedures: none  Follow-Up: Your physician recommends that you schedule a follow-up appointment in: 6-8 weeks in [place] office with Dr. Lovena Le.   Any Other Special Instructions Will Be Listed Below (If Applicable).     If you need a refill on your cardiac medications before your next appointment, please call your pharmacy.

## 2016-02-14 NOTE — Progress Notes (Signed)
HPI Rachel Underwood is referred today for evaluation of palpitations. She has a h/o palpitations which can occur at any time. She has worn a cardiac monitor and has minimal documentation of SVT. I noted 3-5 beats of NS SVT and occaisional PAC's and PVC's. She has never had a frank episode of sustained SVT. She denies syncope. She notes that she sleeps poorly and her energy level is reduced. Her episodes last no more than 1-2 minutes. She notes that at time her heart beats hard and irregularly.  Allergies  Allergen Reactions  . Naproxen Anaphylaxis and Hives  . Piroxicam Anaphylaxis  . Abilify [Aripiprazole]   . Citalopram Hives  . Codeine Nausea And Vomiting  . Depakote [Divalproex Sodium] Swelling  . Gabapentin     Hives   . Haldol [Haloperidol Lactate] Other (See Comments)    Slurs speech and "stumble"  . Indomethacin Swelling    Dizzy, tigling of face  . Invega [Paliperidone] Other (See Comments)    Slurred speech, drooling  . Latuda [Lurasidone Hcl] Itching  . Lithium Hives  . Promethazine Swelling    Arms and hands  . Trazodone And Nefazodone Other (See Comments)    Dry mouth, congestion, severe dryness.  [name] [Escitalopram Oxalate] Rash     Current Outpatient Prescriptions  Medication Sig Dispense Refill  . acyclovir (ZOVIRAX) 400 MG tablet Take 1 tablet (400 mg total) by mouth 2 (two) times daily as needed (for prophylaxis). 60 tablet 5  . albuterol (PROVENTIL HFA;VENTOLIN HFA) 108 (90 BASE) MCG/ACT inhaler Inhale 2 puffs into the lungs every 4 (four) hours as needed for wheezing or shortness of breath.    Marland Kitchen atenolol (TENORMIN) 100 MG tablet Take 1 tablet (100 mg total) by mouth daily. 30 tablet 11  . dexlansoprazole (DEXILANT) 60 MG capsule Take 1 capsule (60 mg total) by mouth daily. 30 capsule 11  . elvitegravir-cobicistat-emtricitabine-tenofovir (STRIBILD) 150-150-200-300 MG TABS tablet Take 1 tablet by mouth daily with breakfast. 30 tablet 11  . EPIPEN 2-PAK  0.3 MG/0.3ML SOAJ injection Inject 0.3 mLs as directed once.  0  . hydrochlorothiazide (HYDRODIURIL) 25 MG tablet TAKE ONE TABLET BY MOUTH DAILY 30 tablet 0  . Linaclotide (LINZESS) 290 MCG CAPS capsule Take 1 capsule (290 mcg total) by mouth daily. On empty stomach 30 capsule 11  . ondansetron (ZOFRAN) 8 MG tablet Take 1 tablet (8 mg total) by mouth every 8 (eight) hours as needed for nausea or vomiting. 60 tablet 2  . OXcarbazepine (TRILEPTAL) 150 MG tablet Take 300 mg by mouth 2 (two) times daily.     . Pitavastatin Calcium 4 MG TABS Take 4 mg by mouth at bedtime. Pitavastatin 4mg  or placebo    . prazosin (MINIPRESS) 1 MG capsule Take 1 mg by mouth at bedtime. For nightmares  1  . sertraline (ZOLOFT) 100 MG tablet Take 250 mg by mouth every morning. Takes 2 daily    . topiramate (TOPAMAX) 50 MG tablet Take 1 tablet (50 mg total) by mouth 2 (two) times daily. 60 tablet 3  . flecainide (TAMBOCOR) 50 MG tablet Take 1 tablet (50 mg total) by mouth 2 (two) times daily. 30 tablet 3  . nystatin cream (MYCOSTATIN) Apply 1 application topically 2 (two) times daily. (Patient not taking: Reported on 02/14/2016) 30 g 0   No current facility-administered medications for this visit.     Past Medical History  Diagnosis Date  . Depression   . HIV (human immunodeficiency virus  infection) ([place])   . Hypertension   . Asthma   . Borderline personality disorder   . Menorrhagia with irregular cycle 06/03/2014  . Dysmenorrhea 06/03/2014  . Enlarged uterus 06/03/2014  . Herpes simplex without mention of complication   . Abnormal Pap smear of cervix 2005(EST)    ckc for abnl pap- [facility] [place]  . GERD (gastroesophageal reflux disease)   . Seizures ([place])     had as a teenager, unknown etiology and no meds.  . Fibromyalgia   . Osteoarthritis   . Allergy   . Hyperlipidemia   . Bipolar 1 disorder ([place])   . BPD (bronchopulmonary dysplasia)   . Carpal tunnel syndrome     bilateral wrist     ROS:   All systems reviewed and negative except as noted in the HPI.   Past Surgical History  Procedure Laterality Date  . Ankle surgery Right     repair of ligaments  . Back surgery    . Shoulder surgery Right     rotator cuff  . Cesarean section    . Ckc    . Abdominal hysterectomy N/A 09/01/2014    Procedure: HYSTERECTOMY ABDOMINAL;  Surgeon: Jonnie Kind, MD;  Location: [facility];  Service: Gynecology;  Laterality: N/A;  . Scar revision N/A 09/01/2014    Procedure: EXCISION OF CICATRIX;  Surgeon: Jonnie Kind, MD;  Location: [facility];  Service: Gynecology;  Laterality: N/A;  . Bilateral salpingectomy Bilateral 09/01/2014    Procedure: BILATERAL SALPINGECTOMY;  Surgeon: Jonnie Kind, MD;  Location: [facility];  Service: Gynecology;  Laterality: Bilateral;  . Excision of skin tag N/A 09/01/2014    Procedure: EXCISION OF SKIN NEVUS;  Surgeon: Jonnie Kind, MD;  Location: [facility];  Service: Gynecology;  Laterality: N/A;  . Colonoscopy N/A 12/23/2014    [id number] canal hemorrhoids left-sided diverticula  . Esophagogastroduodenoscopy N/A 12/23/2014    [id number] reflux/noncritical shatzkis ring s/p dilation/small HH  . Savory dilation N/A 12/23/2014    Procedure: SAVORY DILATION;  Surgeon: Daneil Dolin, MD;  Location: [facility];  Service: Endoscopy;  Laterality: N/A;  Venia Minks dilation N/A 12/23/2014    Procedure: Venia Minks DILATION;  Surgeon: Daneil Dolin, MD;  Location: [facility];  Service: Endoscopy;  Laterality: N/A;  . Fracture surgery    . Spine surgery       Family History  Problem Relation Age of Onset  . Fibroids Mother   . Hypertension Mother   . Depression Mother   . Irritable bowel syndrome Mother   . Arthritis Mother   . Heart disease Mother   . Mental illness Mother   . Stroke Mother   . Vision loss Mother   . Hypertension Father   . Mental illness Father   . Arthritis Father   . Heart disease Father   . Vision loss Father   . Heart disease  Maternal Grandmother   . Cancer Maternal Grandmother     breast  . Depression Maternal Grandmother   . Hyperlipidemia Maternal Grandmother   . Hypertension Maternal Grandmother   . Bipolar disorder Daughter   . Schizophrenia Daughter   . Cancer Paternal Grandfather     prostate  . Heart disease Paternal Grandfather   . Hyperlipidemia Paternal Grandfather   . Hypertension Paternal Grandfather   . Dementia Paternal Grandmother   . Hyperlipidemia Paternal Grandmother   . Hypertension Paternal Grandmother   . Heart disease Maternal Grandfather   .  Alzheimer's disease Maternal Grandfather   . Hyperlipidemia Maternal Grandfather   . Hypertension Maternal Grandfather   . Anxiety disorder Daughter   . ADD / ADHD Son   . Colon cancer Neg Hx   . Hypertension Brother   . Early death Brother   . Learning disabilities Brother   . Mental illness Brother      Social History   Social History  . Marital Status: Divorced    Spouse Name: N/A  . Number of Children: 3  . Years of Education: N/A   Occupational History  . Not on file.   Social History Main Topics  . Smoking status: Current Every Day Smoker -- 0.50 packs/day for 30 years    Types: Cigarettes    Start date: 12/04/1982  . Smokeless tobacco: Never Used  . Alcohol Use: No  . 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 Sexual Activity:    Partners: Male    Birth Control/ Protection: Condom   Other Topics Concern  . Not on file   Social History Narrative     BP 128/94 mmHg  Pulse 54  Ht 5' 4.5" (1.638 m)  Wt 279 lb 12.8 oz (126.916 kg)  BMI 47.30 kg/m2  LMP 08/11/2014  Physical Exam:  Obese  appearing NAD HEENT: Unremarkable Neck:  6 cm JVD, no thyromegally Lymphatics:  No adenopathy Back:  No CVA tenderness Lungs:  Clear with no wheezes, rales, or rhonchi HEART:  Regular rate rhythm, no murmurs, no rubs, no clicks Abd:  soft, positive bowel sounds, no  organomegally, no rebound, no guarding Ext:  2 plus pulses, no edema, no cyanosis, no clubbing Skin:  No rashes no nodules Neuro:  CN II through XII intact, motor grossly intact  EKG - nsr  Heart monitor - nsr with NS atrial tachy  Assess/Plan: 1. Palpitations - I suspect she is having PAC's and PVC's by her history. In addition, she could have NS atrial tachycardia. Doubt AVNRT. She is not pre-excited. I have recommended a trial of low dose flecainide with uptitration as her symptoms dictate. I have also asked her to reduce her caffeine intake.  2. Obesity - I suspect she has sleep apnea. She is undergoing evaluation. 3. HTN - Her blood pressure is reasonably well controlled. Will follow.   Mikle Bosworth.D.

## 2016-02-17 ENCOUNTER — Ambulatory Visit (INDEPENDENT_AMBULATORY_CARE_PROVIDER_SITE_OTHER): Payer: Medicaid Other | Admitting: Family Medicine

## 2016-02-17 ENCOUNTER — Encounter: Payer: Self-pay | Admitting: Family Medicine

## 2016-02-17 VITALS — BP 115/67 | HR 72 | Temp 97.5°F | Ht 64.5 in | Wt 277.0 lb

## 2016-02-17 DIAGNOSIS — G8929 Other chronic pain: Secondary | ICD-10-CM

## 2016-02-17 DIAGNOSIS — G5603 Carpal tunnel syndrome, bilateral upper limbs: Secondary | ICD-10-CM | POA: Insufficient documentation

## 2016-02-17 DIAGNOSIS — J301 Allergic rhinitis due to pollen: Secondary | ICD-10-CM | POA: Diagnosis not present

## 2016-02-17 DIAGNOSIS — M549 Dorsalgia, unspecified: Secondary | ICD-10-CM

## 2016-02-17 MED ORDER — EPIPEN 2-PAK 0.3 MG/0.3ML IJ SOAJ: 0.3000 mg | Freq: Once | INTRAMUSCULAR | Status: DC

## 2016-02-17 MED ORDER — FLUTICASONE PROPIONATE 50 MCG/ACT NA SUSP: 2.0000 | Freq: Every day | NASAL | Status: DC

## 2016-02-17 NOTE — Progress Notes (Signed)
   HPI  Patient presents today here to discuss need for home health, allergic rhinitis, and medication refill.  She explains that due to her shoulder pain and difficulty operating early in the morning she would like to request home health. She does not need any assistive devices, has no mental confusion, or any other compelling reason to get home health.  She did request assistive devices for her apt  Allergic rhinitis C/o severe symps helped but not resolved by zyrtec Flonase previously burned hernose but she is willing to try  Needs refill on epi pen  Requests L hand cock up splint for carpal tinelsymps   PMH: Smoking status noted ROS: Per HPI  Objective: BP 115/67 mmHg  Pulse 72  Temp(Src) 97.5 F (36.4 C) (Oral)  Ht 5' 4.5" (1.638 m)  Wt 277 lb (125.646 kg)  BMI 46.83 kg/m2  LMP 08/11/2014 Gen: NAD, alert, cooperative with exam HEENT: NCAT, L sided nare swollen, TMs WNL BL CV: RRR, good S1/S2, no murmur Resp: CTABL, no wheezes, non-labored Ext: No edema, warm Neuro: Alert and oriented, No gross deficits  Assessment and plan:  # MSK pain Written letter of support for assistive devices No HH need a t this time  # Allergic rhinitis Add flonase, may need to add singulair  # Carpal tunnel L writs- cock up splint Rx written   Laroy Apple, MD [facility] 02/17/2016, 9:42 AM

## 2016-02-17 NOTE — Patient Instructions (Addendum)
Grreat to see you!  Lets see you back in 4 months for routine follow up   Add flonase for allergic rhinitis

## 2016-02-18 ENCOUNTER — Telehealth: Payer: Self-pay | Admitting: Family Medicine

## 2016-02-18 NOTE — Telephone Encounter (Signed)
I am ok with that, can we possibly call it in for [employer] in [place].   Laroy Apple, MD [facility] 02/18/2016, 3:02 PM

## 2016-02-18 NOTE — Telephone Encounter (Signed)
Rx faxed

## 2016-02-22 ENCOUNTER — Telehealth: Payer: Self-pay

## 2016-02-22 NOTE — Telephone Encounter (Signed)
x

## 2016-03-01 ENCOUNTER — Other Ambulatory Visit: Payer: Self-pay

## 2016-03-01 MED ORDER — CETIRIZINE HCL 10 MG PO TABS: 10.0000 mg | ORAL_TABLET | Freq: Every day | ORAL | Status: DC

## 2016-03-01 NOTE — Telephone Encounter (Signed)
Last seen 02/17/16  Dr Wendi Snipes  This med not on EPIC list

## 2016-03-02 ENCOUNTER — Telehealth: Payer: Self-pay | Admitting: *Deleted

## 2016-03-02 DIAGNOSIS — G4700 Insomnia, unspecified: Secondary | ICD-10-CM

## 2016-03-02 DIAGNOSIS — G4730 Sleep apnea, unspecified: Principal | ICD-10-CM

## 2016-03-02 NOTE — Telephone Encounter (Signed)
Pt asking "Has Sleep Study been ordered/arranged?"  This was discussed at the pt's last office visit with Dr. Baxter Flattery.  RN reviewed pt's last office visit notes.  Dr. Storm Frisk notes mention sleep study although no order was placed.

## 2016-03-03 NOTE — Telephone Encounter (Signed)
i am not sure if we did this yet. Do you know how to arrange sleep study?

## 2016-03-07 ENCOUNTER — Telehealth: Payer: Self-pay | Admitting: Internal Medicine

## 2016-03-07 NOTE — Telephone Encounter (Signed)
Pt called stating that the flecainide (TAMBOCOR) 50 MG tablet  Dr. Lovena Le put her on is making her ankles & legs swell

## 2016-03-07 NOTE — Telephone Encounter (Signed)
Will forward to Dr. Taylor for advice.  

## 2016-03-08 MED ORDER — FUROSEMIDE 20 MG PO TABS: 20.0000 mg | ORAL_TABLET | Freq: Every day | ORAL | Status: DC

## 2016-03-08 NOTE — Telephone Encounter (Signed)
Patient notified of Lasix 20 mg daily for the next 3 days. Patient voiced understanding.

## 2016-03-08 NOTE — Telephone Encounter (Signed)
Pt called back stating her feet, ankles were swollen so bad that she could not wear shoes. She was wondering if she could get a fluid pill to take the swelling down that the flecainide caused. She has thrown the medication away, but the swelling will not go away.  Sent a staff message to Dr. Bronson Ing for advise.

## 2016-03-08 NOTE — Telephone Encounter (Signed)
-----   Message from Herminio Commons, MD sent at 03/08/2016  4:43 PM EDT ----- Lasix 20 mg daily x 3 days. Contact Dr. Lovena Le to let him know.  ----- Message -----    From: Drema Dallas, CMA    Sent: 03/08/2016   3:55 PM      To: Herminio Commons, MD  Pt was prescribed flecainide by Dr. Lovena Le a few weeks ago- which caused her feet and ankles to swell awful. She has thrown out the medication but would like to know if you can give her a fluid pill for a few days to get rid of the swelling because she cannot fit her shoes. She is a little upset because noone ever got back with her about a previous message. Please advise. Thanks.Marland Kitchen

## 2016-03-10 ENCOUNTER — Telehealth: Payer: Self-pay | Admitting: Internal Medicine

## 2016-03-10 NOTE — Telephone Encounter (Signed)
Pt heart racing and skipping beats, worse yesterday, pt was put on med to get her heart back in rhythm , med worked but caused hands, feet and ankles were swollen, was given rx for Lasix for 3 days, to day is last day,  swelling better. Pls advise pt asking next step plan of tx, can not sleep at night [phone]

## 2016-03-10 NOTE — Telephone Encounter (Signed)
Left message on machine for patient to return call Monday

## 2016-03-10 NOTE — Telephone Encounter (Signed)
Order placed for Sleep Study to be performed at [facility].  [facility] will call the pt to arrange the study.  Please co-sign order.

## 2016-03-10 NOTE — Addendum Note (Signed)
Addended by: Lorne Skeens D on: 03/10/2016 02:44 PM   Modules accepted: Orders

## 2016-03-12 IMAGING — CT CT L SPINE W/O CM
3 of 8 series · 11 of 33 positions shown, 13 images · non-contrast
Comparison: February 10, 2015 CT abdomen and pelvis with bony reformats

CLINICAL DATA: Chronic lumbago with bilateral radicular type
symptoms

EXAM:
CT LUMBAR SPINE WITHOUT CONTRAST
TECHNIQUE: Multidetector CT imaging of the lumbar spine was performed without
intravenous contrast administration. Multiplanar CT image
reconstructions were also generated.

[Series 3: lumbar spine 2.0 b30s · axial · 0.31mm/px · z∈[-362,-234]mm · 3 of 129 slices shown, 4 images]
[im 33/129  soft-tissue]
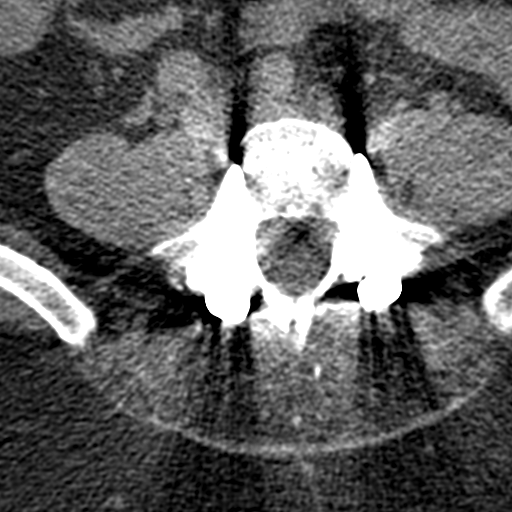
[im 33/129  bone]
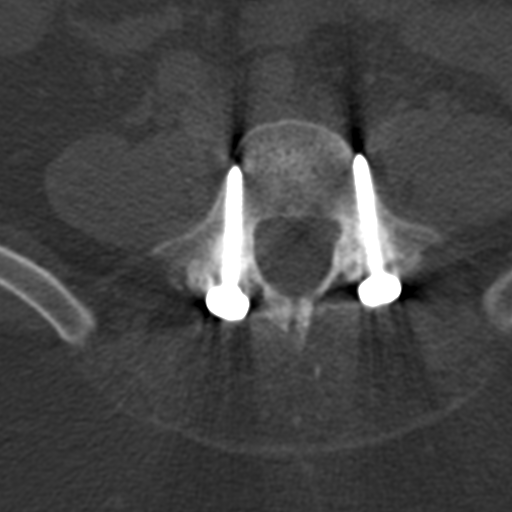
[im 65/129  bone]
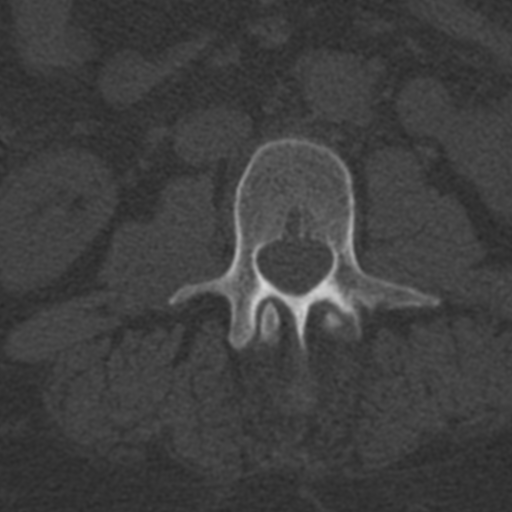
[im 97/129  bone]
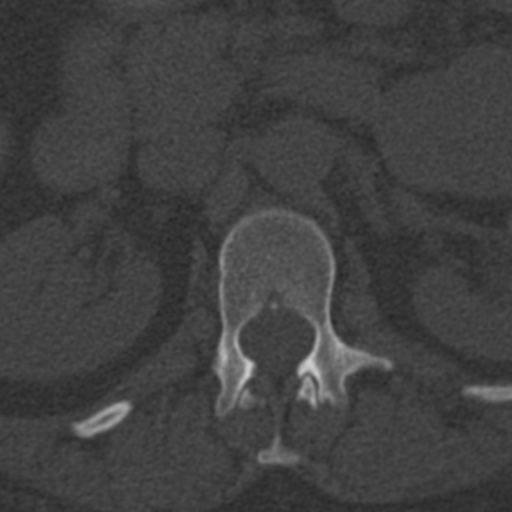

[Series 4: lumbar spine 2.0 spo · coronal · 0.30mm/px · 3 of 77 slices shown]
[im 16/77  bone]
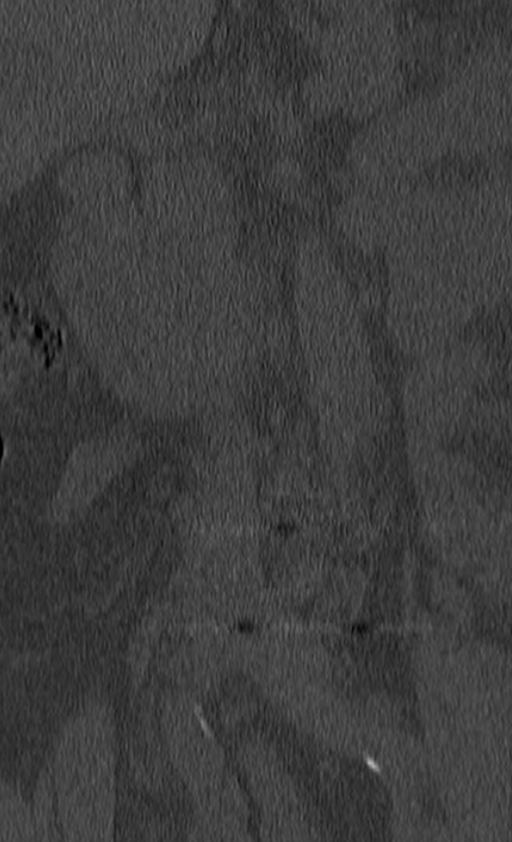
[im 31/77  bone]
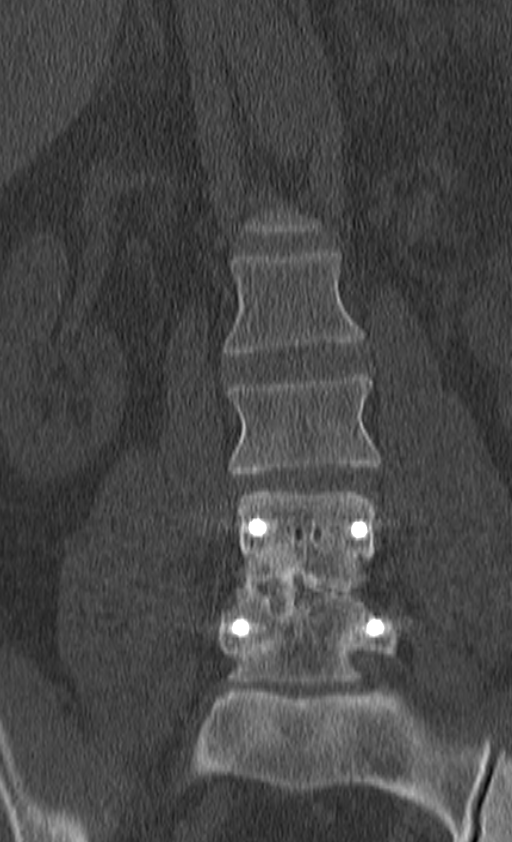
[im 46/77  bone]
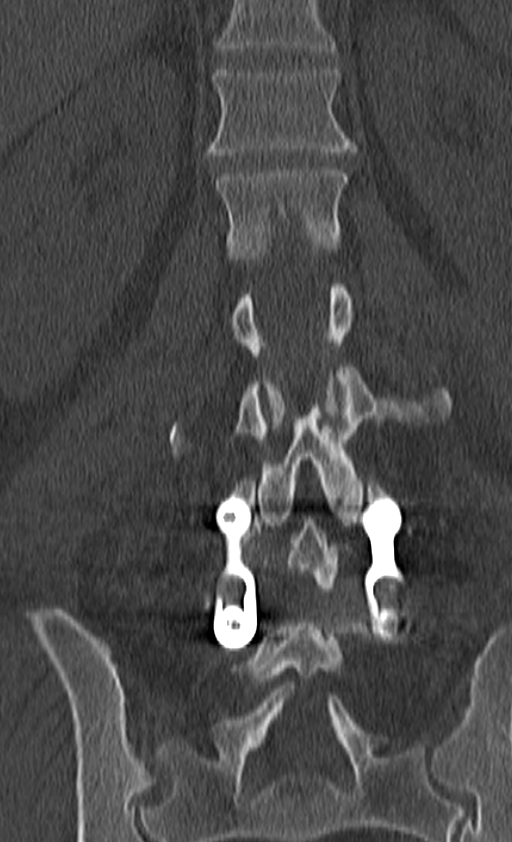

[Series 5: lumbar spine 2.0 spo sag · sagittal · 0.30mm/px · 5 of 67 slices shown, 6 images]
[im 23/67  bone]
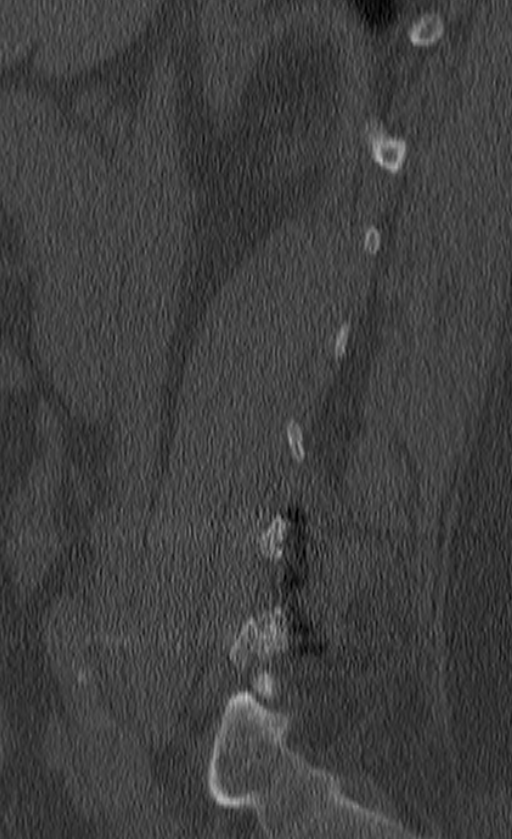
[im 28/67  bone]
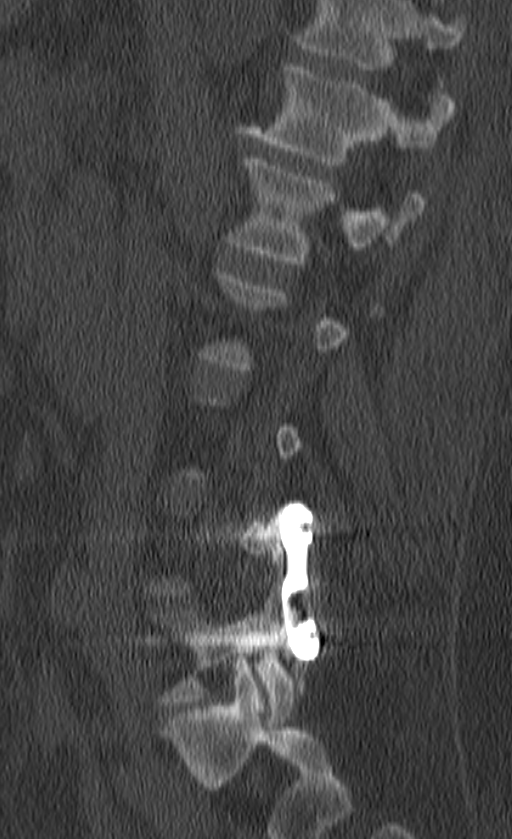
[im 34/67  soft-tissue]
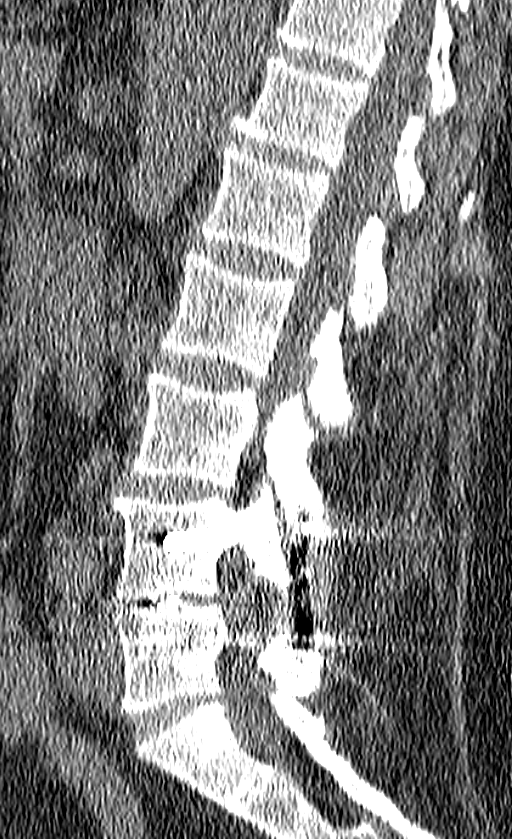
[im 34/67  bone]
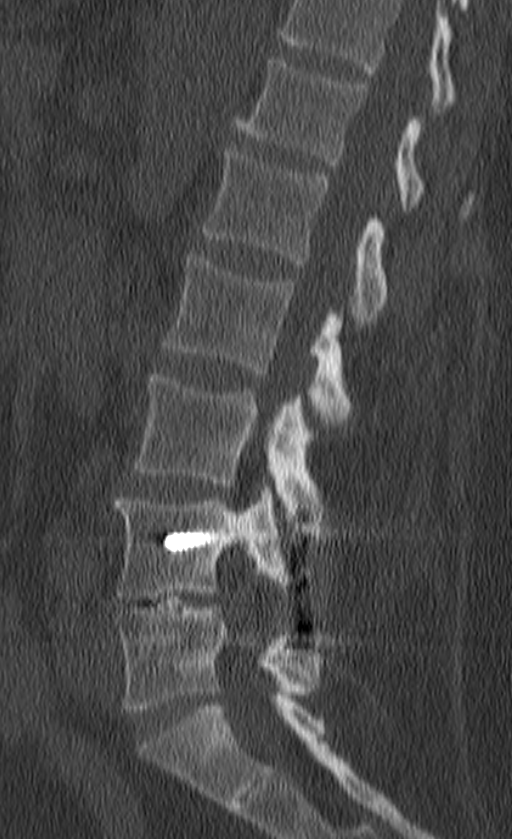
[im 39/67  bone]
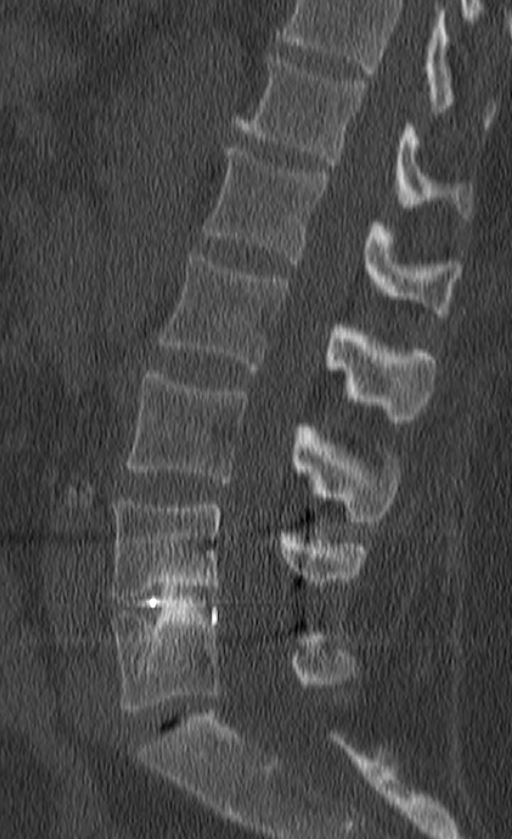
[im 45/67  bone]
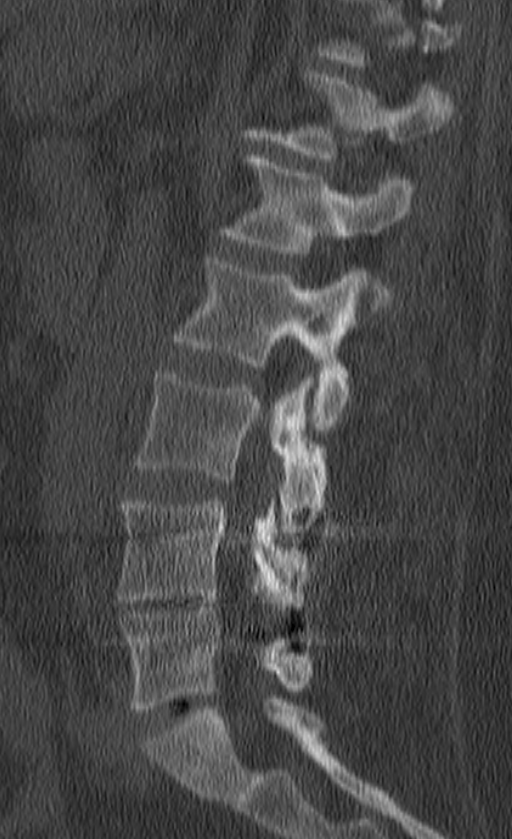

[11 of 33 positions shown; findings below may reference images not displayed]

FINDINGS: The patient has undergone posterior pedicle screw fixation at L4-5
and L5-S1 bilaterally. The screws and plates appear intact. The
screws traverse the respective pedicles bilaterally at these levels.
There is a disc spacer at L4-5 which is intact. There is no erosive
change or bony destruction. No fracture. There is minimal
retrolisthesis of L5 on S1 felt to be due to spondylosis. No other
spondylolisthesis. There is moderate disc space narrowing at T12-L1.
Vacuum phenomenon is noted at L5-S1.

At T11-12, there is slight facet hypertrophy bilaterally. No nerve
root edema or effacement. No disc extrusion or stenosis.

At T12-L1, there is mild facet hypertrophy bilaterally. No nerve
root edema or effacement. No disc extrusion or stenosis.

At L1-2, there is mild facet hypertrophy bilaterally. No nerve root
edema or effacement. No disc extrusion or stenosis.

At L2-3, there is mild facet hypertrophy bilaterally, modestly more
on the right than on the left. There is no nerve root edema or
effacement. No disc extrusion or stenosis.

At L3-4, there is moderate facet hypertrophy bilaterally, slightly
more on the right than on the left. No nerve root edema or
effacement. No disc extrusion or stenosis.

At L4-5, there is postoperative change with moderate facet
hypertrophy bilaterally. There is postoperative change posteriorly.
There is no disc extrusion or stenosis. No nerve root edema or
effacement is appreciable on noncontrast enhanced study.

At L5-S1, there is moderate facet hypertrophy bilaterally. There is
mild diffuse disc bulging. There is exit foraminal narrowing
bilaterally at this level due to bony hypertrophy. There is
asymmetric disc protrusion in the medial exit foramen on the right
at L5-S1 which abuts and slightly effaces the right S1 nerve root.
There is no well-defined disc extrusion or spinal stenosis, however.

There are no paraspinous lesions.
IMPRESSION: Postoperative change at L4-5 L5-S1. Asymmetric disc protrusion at
the exit foramen on the right at L5-S1 which impresses upon and
subtly effaces the proximal S1 nerve root on the right. Frank disc
extrusion is not appreciable, however. No other nerve root edema or
effacement is appreciable. No spinal stenosis. Relatively mild
osteoarthritic changes noted at multiple levels. No fracture.
Minimal retrolisthesis at L5-S1 is felt to be due to the underlying
spondylosis.

## 2016-03-13 NOTE — Telephone Encounter (Addendum)
Discussed with Dr Lovena Le will try Propafenone 225mg  bid.  When patient calls me back will discuss.  I have left her a message to call me

## 2016-03-23 ENCOUNTER — Ambulatory Visit: Payer: Medicaid Other | Admitting: Family Medicine

## 2016-03-30 ENCOUNTER — Ambulatory Visit (INDEPENDENT_AMBULATORY_CARE_PROVIDER_SITE_OTHER): Payer: Medicaid Other | Admitting: Family Medicine

## 2016-03-30 ENCOUNTER — Encounter: Payer: Self-pay | Admitting: Family Medicine

## 2016-03-30 VITALS — BP 118/78 | HR 57 | Temp 97.7°F | Ht 64.5 in | Wt 281.8 lb

## 2016-03-30 DIAGNOSIS — F319 Bipolar disorder, unspecified: Secondary | ICD-10-CM

## 2016-03-30 DIAGNOSIS — G5603 Carpal tunnel syndrome, bilateral upper limbs: Secondary | ICD-10-CM

## 2016-03-30 DIAGNOSIS — J301 Allergic rhinitis due to pollen: Secondary | ICD-10-CM | POA: Diagnosis not present

## 2016-03-30 MED ORDER — MONTELUKAST SODIUM 10 MG PO TABS: 10.0000 mg | ORAL_TABLET | Freq: Every day | ORAL | Status: DC

## 2016-03-30 NOTE — Progress Notes (Signed)
   HPI  Patient presents today here to discuss allergic rhinitis, mood disorder, and carpal tunnel syndrome.  Carpal tunnel syndrome long-standing right sided, less severe left-sided symptoms. Explains that over the last few months she's had worsening symptoms with very severe numbness and tingling of the first 3 fingers in the little bit of the fourth and fifth digit. She has talked about this with her orthopedic surgeon who recommends that she get a referral to him for carpal tunnel release. She's tried SPLINTS which has worsened the pain this episode, previously she had good response to it.  Allergic rhinitis Using Zyrtec and Flonase daily with good improvement, however she has persistent sneezing and nasal symptoms.  Mood disorder Has bipolar disorder, borderline personality disorder, states that she would like to change her medication prescribing to our office. Stopped her medications currently and feels well, denies any depression, anxiety, or mania at this time.   PMH: Smoking status noted ROS: Per HPI  Objective: BP 118/78 mmHg  Pulse 57  Temp(Src) 97.7 F (36.5 C) (Oral)  Ht 5' 4.5" (1.638 m)  Wt 281 lb 12.8 oz (127.824 kg)  BMI 47.64 kg/m2  LMP 08/11/2014 Gen: NAD, alert, cooperative with exam HEENT: NCAT, nares with swollen turbinates bilaterally CV: RRR, good S1/S2, no murmur Resp: CTABL, no wheezes, non-labored Ext: No edema, warm Neuro: Alert and oriented, No gross deficits  Assessment and plan:  # Allergic rhinitis Helped by Flonase and Zyrtec, add Singulair Follow-up in 3 months  # Bilateral carpal tunnel syndrome Orthopedic referral placed per her request, no thenar wasting appreciated  # Bipolar disorder, mood disorder Discussed options for her, I recommended follow-up with psychiatry and restarting her medications. At this time she seems like she is doing very well without medications Recommended [facility], given packet with  several numbers      Orders Placed This Encounter  Procedures  . Ambulatory referral to Psychiatry    Referral Priority:  Routine    Referral Type:  Psychiatric    Referral Reason:  Specialty Services Required    Requested Specialty:  Psychiatry    Number of Visits Requested:  1  . Ambulatory referral to Orthopedic Surgery    Referral Priority:  Routine    Referral Type:  Surgical    Referral Reason:  Specialty Services Required    Requested Specialty:  Orthopedic Surgery    Number of Visits Requested:  1    Meds ordered this encounter  Medications  . montelukast (SINGULAIR) 10 MG tablet    Sig: Take 1 tablet (10 mg total) by mouth at bedtime.    Dispense:  30 tablet    Refill:  [place], MD [facility] 03/30/2016, 3:10 PM

## 2016-03-30 NOTE — Patient Instructions (Signed)
Great to see you!  I have sent a couple of referrals, you will have to call behavioral health  Start singulair for your allergies

## 2016-03-31 ENCOUNTER — Telehealth: Payer: Self-pay | Admitting: Pharmacist Clinician (PhC)/ Clinical Pharmacy Specialist

## 2016-03-31 NOTE — Telephone Encounter (Signed)
Rachel Underwood called to ask if it's ok for her to take [place] with her HIV Stribild. I told her that it was ok. While reviewing her med list, Trileptal is also on the profile. This could be a major issue with the stribild. Although her VL has been undetectable. However, she has tapered herself off all psych meds and is in the process of finding a new MD to manage her mental health.

## 2016-04-04 ENCOUNTER — Telehealth: Payer: Self-pay | Admitting: Internal Medicine

## 2016-04-04 NOTE — Telephone Encounter (Signed)
Routing to LSL 

## 2016-04-04 NOTE — Telephone Encounter (Signed)
Pt called to say that Dr Wilder Glade told her it would be OK to increase her Dexilant while she is taking her HIV medications.

## 2016-04-05 ENCOUNTER — Other Ambulatory Visit (HOSPITAL_COMMUNITY): Payer: Self-pay | Admitting: Respiratory Therapy

## 2016-04-05 NOTE — Telephone Encounter (Signed)
As discussed before, I would advise keeping Dexilant to once in AM (it is a dual release drug) but we can add zantac in evening. Let me know if she agrees and I will send in rx for zantac.

## 2016-04-06 ENCOUNTER — Encounter: Payer: Self-pay | Admitting: Internal Medicine

## 2016-04-06 ENCOUNTER — Ambulatory Visit (INDEPENDENT_AMBULATORY_CARE_PROVIDER_SITE_OTHER): Payer: Medicaid Other | Admitting: Internal Medicine

## 2016-04-06 VITALS — BP 102/64 | HR 60 | Ht 66.0 in | Wt 281.0 lb

## 2016-04-06 DIAGNOSIS — R002 Palpitations: Secondary | ICD-10-CM | POA: Diagnosis not present

## 2016-04-06 MED ORDER — PROPAFENONE HCL 225 MG PO TABS: 225.0000 mg | ORAL_TABLET | Freq: Three times a day (TID) | ORAL | Status: DC

## 2016-04-06 NOTE — Progress Notes (Signed)
HPI Rachel Underwood returns today for ongoing evaluation of palpitations. She has a h/o palpitations which can occur at any time. She denies syncope. She notes that she sleeps poorly and her energy level is reduced. She is pending sleep evaluation. Her episodes last no more than 1-2 minutes. She notes that at time her heart beats hard and irregularly. When I saw her last, we started a trial of flecainide. In the interim, she has been unable to take the flecainide because of swelling in her legs. Allergies  Allergen Reactions  . Flecainide Swelling  . Naproxen Anaphylaxis and Hives  . Piroxicam Anaphylaxis  . Abilify [Aripiprazole]   . Citalopram Hives  . Codeine Nausea And Vomiting  . Depakote [Divalproex Sodium] Swelling  . Gabapentin     Hives   . Haldol [Haloperidol Lactate] Other (See Comments)    Slurs speech and "stumble"  . Indomethacin Swelling    Dizzy, tigling of face  . Invega [Paliperidone] Other (See Comments)    Slurred speech, drooling  . Latuda [Lurasidone Hcl] Itching  . Lithium Hives  . Promethazine Swelling    Arms and hands  . Trazodone And Nefazodone Other (See Comments)    Dry mouth, congestion, severe dryness.  Rachel Underwood [Escitalopram Oxalate] Rash     Current Outpatient Prescriptions  Medication Sig Dispense Refill  . acyclovir (ZOVIRAX) 400 MG tablet Take 1 tablet (400 mg total) by mouth 2 (two) times daily as needed (for prophylaxis). 60 tablet 5  . albuterol (PROVENTIL HFA;VENTOLIN HFA) 108 (90 BASE) MCG/ACT inhaler Inhale 2 puffs into the lungs every 4 (four) hours as needed for wheezing or shortness of breath. Reported on 04/06/2016    . atenolol (TENORMIN) 100 MG tablet Take 1 tablet (100 mg total) by mouth daily. 30 tablet 11  . cetirizine (ZYRTEC) 10 MG tablet Take 1 tablet (10 mg total) by mouth daily. 30 tablet 5  . dexlansoprazole (DEXILANT) 60 MG capsule Take 1 capsule (60 mg total) by mouth daily. 30 capsule 11  .  elvitegravir-cobicistat-emtricitabine-tenofovir (STRIBILD) 150-150-200-300 MG TABS tablet Take 1 tablet by mouth daily with breakfast. 30 tablet 11  . EPIPEN 2-PAK 0.3 MG/0.3ML SOAJ injection Inject 0.3 mLs (0.3 mg total) into the skin once. 1 Device 0  . Linaclotide (LINZESS) 290 MCG CAPS capsule Take 1 capsule (290 mcg total) by mouth daily. On empty stomach 30 capsule 11  . montelukast (SINGULAIR) 10 MG tablet Take 1 tablet (10 mg total) by mouth at bedtime. 30 tablet 3  . ondansetron (ZOFRAN) 8 MG tablet Take 1 tablet (8 mg total) by mouth every 8 (eight) hours as needed for nausea or vomiting. 60 tablet 2  . oxyCODONE-acetaminophen (PERCOCET) 10-325 MG tablet Take 1 tablet by mouth every 4 (four) hours as needed for pain.    . Pitavastatin Calcium 4 MG TABS Take 4 mg by mouth at bedtime. Pitavastatin 4mg  or placebo    . sertraline (ZOLOFT) 100 MG tablet Take 250 mg by mouth every morning. Takes 2 daily    . topiramate (TOPAMAX) 50 MG tablet Take 1 tablet (50 mg total) by mouth 2 (two) times daily. 60 tablet 3   No current facility-administered medications for this visit.     Past Medical History  Diagnosis Date  . Depression   . HIV (human immunodeficiency virus infection) ([place])   . Hypertension   . Asthma   . Borderline personality disorder   . Menorrhagia with irregular cycle 06/03/2014  .  Dysmenorrhea 06/03/2014  . Enlarged uterus 06/03/2014  . Herpes simplex without mention of complication   . Abnormal Pap smear of cervix 2005(EST)    ckc for abnl pap- [facility] [place]  . GERD (gastroesophageal reflux disease)   . Seizures ([place])     had as a teenager, unknown etiology and no meds.  . Fibromyalgia   . Osteoarthritis   . Allergy   . Hyperlipidemia   . Bipolar 1 disorder ([place])   . BPD (bronchopulmonary dysplasia)   . Carpal tunnel syndrome     bilateral wrist    ROS:   All systems reviewed and negative except as noted in the HPI.   Past Surgical History    Procedure Laterality Date  . Ankle surgery Right     repair of ligaments  . Back surgery    . Shoulder surgery Right     rotator cuff  . Cesarean section    . Ckc    . Abdominal hysterectomy N/A 09/01/2014    Procedure: HYSTERECTOMY ABDOMINAL;  Surgeon: Jonnie Kind, MD;  Location: [facility];  Service: Gynecology;  Laterality: N/A;  . Scar revision N/A 09/01/2014    Procedure: EXCISION OF CICATRIX;  Surgeon: Jonnie Kind, MD;  Location: [facility];  Service: Gynecology;  Laterality: N/A;  . Bilateral salpingectomy Bilateral 09/01/2014    Procedure: BILATERAL SALPINGECTOMY;  Surgeon: Jonnie Kind, MD;  Location: [facility];  Service: Gynecology;  Laterality: Bilateral;  . Excision of skin tag N/A 09/01/2014    Procedure: EXCISION OF SKIN NEVUS;  Surgeon: Jonnie Kind, MD;  Location: [facility];  Service: Gynecology;  Laterality: N/A;  . Colonoscopy N/A 12/23/2014    [id number] canal hemorrhoids left-sided diverticula  . Esophagogastroduodenoscopy N/A 12/23/2014    [id number] reflux/noncritical shatzkis ring s/p dilation/small HH  . Savory dilation N/A 12/23/2014    Procedure: SAVORY DILATION;  Surgeon: Daneil Dolin, MD;  Location: [facility];  Service: Endoscopy;  Laterality: N/A;  Venia Minks dilation N/A 12/23/2014    Procedure: Venia Minks DILATION;  Surgeon: Daneil Dolin, MD;  Location: [facility];  Service: Endoscopy;  Laterality: N/A;  . Fracture surgery    . Spine surgery       Family History  Problem Relation Age of Onset  . Fibroids Mother   . Hypertension Mother   . Depression Mother   . Irritable bowel syndrome Mother   . Arthritis Mother   . Heart disease Mother   . Mental illness Mother   . Stroke Mother   . Vision loss Mother   . Hypertension Father   . Mental illness Father   . Arthritis Father   . Heart disease Father   . Vision loss Father   . Heart disease Maternal Grandmother   . Cancer Maternal Grandmother     breast  . Depression Maternal Grandmother   .  Hyperlipidemia Maternal Grandmother   . Hypertension Maternal Grandmother   . Bipolar disorder Daughter   . Schizophrenia Daughter   . Cancer Paternal Grandfather     prostate  . Heart disease Paternal Grandfather   . Hyperlipidemia Paternal Grandfather   . Hypertension Paternal Grandfather   . Dementia Paternal Grandmother   . Hyperlipidemia Paternal Grandmother   . Hypertension Paternal Grandmother   . Heart disease Maternal Grandfather   . Alzheimer's disease Maternal Grandfather   . Hyperlipidemia Maternal Grandfather   . Hypertension Maternal Grandfather   . Anxiety disorder Daughter   .  ADD / ADHD Son   . Colon cancer Neg Hx   . Hypertension Brother   . Early death Brother   . Learning disabilities Brother   . Mental illness Brother      Social History   Social History  . Marital Status: Divorced    Spouse Name: N/A  . Number of Children: 3  . Years of Education: N/A   Occupational History  . Not on file.   Social History Main Topics  . Smoking status: Current Every Day Smoker -- 0.50 packs/day for 30 years    Types: Cigarettes    Start date: 12/04/1982  . Smokeless tobacco: Never Used  . Alcohol Use: No  . 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 Sexual Activity:    Partners: Male    Birth Control/ Protection: Condom   Other Topics Concern  . Not on file   Social History Narrative     BP 102/64 mmHg  Pulse 60  Ht 5\' 6"  (1.676 m)  Wt 281 lb (127.461 kg)  BMI 45.38 kg/m2  SpO2 98%  LMP 08/11/2014  Physical Exam:  Obese  appearing NAD HEENT: Unremarkable Neck:  6 cm JVD, no thyromegally Lymphatics:  No adenopathy Back:  No CVA tenderness Lungs:  Clear with no wheezes, rales, or rhonchi HEART:  Regular rate rhythm, no murmurs, no rubs, no clicks Abd:  soft, positive bowel sounds, no organomegally, no rebound, no guarding Ext:  2 plus pulses, no edema, no cyanosis, no clubbing Skin:  No rashes no  nodules Neuro:  CN II through XII intact, motor grossly intact  Heart monitor - nsr with NS atrial tachy  Assess/Plan: 1. Palpitations - I have discussed the relatively benign nature of her symptoms. She is very symptomatic. She will try propafenone as she could not take flecainide. 2. Obesity - I suspect she has sleep apnea. She is undergoing evaluation. I have strongly encouraged the patient to reduce her consumption of sweat drinks. 3. HTN - Her blood pressure is reasonably well controlled. Will follow.  4. Asthma/copd - she will remain on her bronchodilators. Will follow. No wheezes today.  Mikle Bosworth.D.

## 2016-04-06 NOTE — Patient Instructions (Signed)
Your physician wants you to follow-up in: 1 Year with Dr. Lovena Le. You will receive a reminder letter in the mail two months in advance. If you don't receive a letter, please call our office to schedule the follow-up appointment.  Your physician has recommended you make the following change in your medication:  Start Prophfenone 225 mg Two Times Daily  Your physician recommends that you schedule a follow-up appointment for an EKG in 2 Weeks  If you need a refill on your cardiac medications before your next appointment, please call your pharmacy.  Thank you for choosing [facility]!

## 2016-04-06 NOTE — Telephone Encounter (Signed)
Tried to call- NA 

## 2016-04-07 MED ORDER — RANITIDINE HCL 150 MG PO TABS: 150.0000 mg | ORAL_TABLET | Freq: Every day | ORAL | Status: DC

## 2016-04-07 NOTE — Telephone Encounter (Signed)
Pt is aware. Ok to send in zantac to [facility] in [place].

## 2016-04-07 NOTE — Telephone Encounter (Signed)
RX done. 

## 2016-04-07 NOTE — Addendum Note (Signed)
Addended by: Mahala Menghini on: 04/07/2016 01:34 PM   Modules accepted: Orders

## 2016-04-24 ENCOUNTER — Telehealth (HOSPITAL_COMMUNITY): Payer: Self-pay | Admitting: *Deleted

## 2016-04-24 NOTE — Telephone Encounter (Signed)
spoke with pt to sch appt with provider due to ref from [facility] and per pt, she's just going to stay where she's at and not sch with this office.

## 2016-04-25 ENCOUNTER — Ambulatory Visit: Payer: Medicaid Other | Attending: Internal Medicine | Admitting: Neurology

## 2016-04-25 VITALS — Ht 66.0 in | Wt 281.0 lb

## 2016-04-25 DIAGNOSIS — G4730 Sleep apnea, unspecified: Secondary | ICD-10-CM

## 2016-04-25 DIAGNOSIS — G4700 Insomnia, unspecified: Secondary | ICD-10-CM | POA: Diagnosis present

## 2016-04-25 DIAGNOSIS — G4733 Obstructive sleep apnea (adult) (pediatric): Secondary | ICD-10-CM | POA: Diagnosis not present

## 2016-04-27 ENCOUNTER — Ambulatory Visit (INDEPENDENT_AMBULATORY_CARE_PROVIDER_SITE_OTHER): Payer: Medicaid Other

## 2016-04-27 DIAGNOSIS — Z79899 Other long term (current) drug therapy: Secondary | ICD-10-CM

## 2016-04-27 NOTE — Procedures (Signed)
Chelsea A. Merlene Laughter, MD     [email or web address]             NOCTURNAL POLYSOMNOGRAPHY   LOCATION: [facility]   Patient Name: Rachel Underwood, Rachel Underwood Study Date: 04/25/2016 Gender: Female D.O.B: 08-26-1971 Age (years): 89 Referring Provider: Not Available Height (inches): 66 Interpreting Physician: Phillips Odor MD, ABSM Weight (lbs): 281 RPSGT: Rosebud Poles BMI: 45 MRN: [id number] Neck Size: 16.50 CLINICAL INFORMATION Sleep Study Type: NPSG Indication for sleep study: Insomnia Epworth Sleepiness Score: 10 SLEEP STUDY TECHNIQUE As per the AASM Manual for the Scoring of Sleep and Associated Events v2.3 (Nivia 2016) with a hypopnea requiring 4% desaturations. The channels recorded and monitored were frontal, central and occipital EEG, electrooculogram (EOG), submentalis EMG (chin), nasal and oral airflow, thoracic and abdominal wall motion, anterior tibialis EMG, snore microphone, electrocardiogram, and pulse oximetry. MEDICATIONS Patient's medications include: N/A. Medications self-administered by patient during sleep study : No sleep medicine administered.  Current outpatient prescriptions:  .  acyclovir (ZOVIRAX) 400 MG tablet, Take 1 tablet (400 mg total) by mouth 2 (two) times daily as needed (for prophylaxis)., Disp: 60 tablet, Rfl: 5 .  albuterol (PROVENTIL HFA;VENTOLIN HFA) 108 (90 BASE) MCG/ACT inhaler, Inhale 2 puffs into the lungs every 4 (four) hours as needed for wheezing or shortness of breath. Reported on 04/06/2016, Disp: , Rfl:  .  atenolol (TENORMIN) 100 MG tablet, Take 1 tablet (100 mg total) by mouth daily., Disp: 30 tablet, Rfl: 11 .  cetirizine (ZYRTEC) 10 MG tablet, Take 1 tablet (10 mg total) by mouth daily., Disp: 30 tablet, Rfl: 5 .  dexlansoprazole (DEXILANT) 60 MG capsule, Take 1 capsule (60 mg total) by mouth daily., Disp: 30 capsule, Rfl: 11 .  elvitegravir-cobicistat-emtricitabine-tenofovir (STRIBILD) 150-150-200-300 MG TABS tablet, Take 1  tablet by mouth daily with breakfast., Disp: 30 tablet, Rfl: 11 .  EPIPEN 2-PAK 0.3 MG/0.3ML SOAJ injection, Inject 0.3 mLs (0.3 mg total) into the skin once., Disp: 1 Device, Rfl: 0 .  Linaclotide (LINZESS) 290 MCG CAPS capsule, Take 1 capsule (290 mcg total) by mouth daily. On empty stomach, Disp: 30 capsule, Rfl: 11 .  montelukast (SINGULAIR) 10 MG tablet, Take 1 tablet (10 mg total) by mouth at bedtime., Disp: 30 tablet, Rfl: 3 .  ondansetron (ZOFRAN) 8 MG tablet, Take 1 tablet (8 mg total) by mouth every 8 (eight) hours as needed for nausea or vomiting., Disp: 60 tablet, Rfl: 2 .  oxyCODONE-acetaminophen (PERCOCET) 10-325 MG tablet, Take 1 tablet by mouth every 4 (four) hours as needed for pain., Disp: , Rfl:  .  Pitavastatin Calcium 4 MG TABS, Take 4 mg by mouth at bedtime. Pitavastatin 4mg  or placebo, Disp: , Rfl:  .  propafenone (RYTHMOL) 225 MG tablet, Take 1 tablet (225 mg total) by mouth every 8 (eight) hours., Disp: 60 tablet, Rfl: 11 .  ranitidine (ZANTAC) 150 MG tablet, Take 1 tablet (150 mg total) by mouth at bedtime., Disp: 30 tablet, Rfl: 5 .  sertraline (ZOLOFT) 100 MG tablet, Take 250 mg by mouth every morning. Takes 2 daily, Disp: , Rfl:  .  topiramate (TOPAMAX) 50 MG tablet, Take 1 tablet (50 mg total) by mouth 2 (two) times daily., Disp: 60 tablet, Rfl: 3  SLEEP ARCHITECTURE The study was initiated at 9:41:23 PM and ended at 4:28:08 AM. Sleep onset time was 7.3 minutes and the sleep efficiency was 90.7%. The total sleep time was 369.0 minutes. Stage REM latency was 79.5 minutes. The patient spent 2.71% of the night  in stage N1 sleep, 51.22% in stage N2 sleep, 21.68% in stage N3 and 24.39% in REM. Alpha intrusion was absent. Supine sleep was 0.00%. RESPIRATORY PARAMETERS The overall apnea/hypopnea index (AHI) was 0.5 per hour. There were 0 total apneas, including 0 obstructive, 0 central and 0 mixed apneas. There were 3 hypopneas and 0 RERAs. The AHI during Stage REM sleep  was 2.0 per hour. AHI while supine was N/A per hour. The mean oxygen saturation was 94.76%. The minimum SpO2 during sleep was 90.00%. Moderate snoring was noted during this study. CARDIAC DATA The 2 lead EKG demonstrated sinus rhythm. The mean heart rate was N/A beats per minute. Other EKG findings include: None. LEG MOVEMENT DATA The total PLMS were 15 with a resulting PLMS index of 2.44. Associated arousal with leg movement index was 1.3.   IMPRESSIONS - No significant obstructive sleep apnea occurred during this study. - No significant central sleep apnea occurred during this study. - The patient had minimal or no oxygen desaturation. - The patient snored with Moderate snoring volume.   Delano Metz, MD Diplomate, American Board of Sleep Medicine.

## 2016-04-27 NOTE — Progress Notes (Signed)
Pt came in for an EKG. She stated she is still having palpitations as often as she was before starting propafenone 225 mg- 1 tablet every 8 hrs. Please advise.

## 2016-05-04 ENCOUNTER — Ambulatory Visit: Payer: Self-pay | Admitting: Family Medicine

## 2016-05-05 ENCOUNTER — Encounter: Payer: Self-pay | Admitting: *Deleted

## 2016-05-10 ENCOUNTER — Encounter (INDEPENDENT_AMBULATORY_CARE_PROVIDER_SITE_OTHER): Payer: Medicaid Other | Admitting: *Deleted

## 2016-05-10 VITALS — BP 109/74 | HR 57 | Temp 97.3°F | Resp 16 | Wt 282.0 lb

## 2016-05-10 DIAGNOSIS — Z21 Asymptomatic human immunodeficiency virus [HIV] infection status: Secondary | ICD-10-CM

## 2016-05-10 DIAGNOSIS — Z006 Encounter for examination for normal comparison and control in clinical research program: Secondary | ICD-10-CM

## 2016-05-10 LAB — CBC WITH DIFFERENTIAL/PLATELET
BASOS ABS: 0 {cells}/uL (ref 0–200)
BASOS PCT: 0 %
EOS ABS: 66 {cells}/uL (ref 15–500)
Eosinophils Relative: 1 %
HEMATOCRIT: 35.8 % (ref 35.0–45.0)
HEMOGLOBIN: 12.2 g/dL (ref 11.7–15.5)
LYMPHS ABS: 1782 {cells}/uL (ref 850–3900)
Lymphocytes Relative: 27 %
MCH: 32.8 pg (ref 27.0–33.0)
MCHC: 34.1 g/dL (ref 32.0–36.0)
MCV: 96.2 fL (ref 80.0–100.0)
MONO ABS: 528 {cells}/uL (ref 200–950)
MPV: 8.9 fL (ref 7.5–12.5)
Monocytes Relative: 8 %
NEUTROS ABS: 4224 {cells}/uL (ref 1500–7800)
Neutrophils Relative %: 64 %
Platelets: 197 10*3/uL (ref 140–400)
RBC: 3.72 MIL/uL — ABNORMAL LOW (ref 3.80–5.10)
RDW: 13.3 % (ref 11.0–15.0)
WBC: 6.6 10*3/uL (ref 3.8–10.8)

## 2016-05-10 LAB — COMPREHENSIVE METABOLIC PANEL
ALBUMIN: 3.5 g/dL — AB (ref 3.6–5.1)
ALT: 9 U/L (ref 6–29)
AST: 8 U/L — AB (ref 10–30)
Alkaline Phosphatase: 54 U/L (ref 33–115)
BILIRUBIN TOTAL: 0.3 mg/dL (ref 0.2–1.2)
BUN: 9 mg/dL (ref 7–25)
CALCIUM: 8.6 mg/dL (ref 8.6–10.2)
CHLORIDE: 108 mmol/L (ref 98–110)
CO2: 23 mmol/L (ref 20–31)
Creat: 0.63 mg/dL (ref 0.50–1.10)
GLUCOSE: 98 mg/dL (ref 65–99)
POTASSIUM: 3.8 mmol/L (ref 3.5–5.3)
Sodium: 139 mmol/L (ref 135–146)
Total Protein: 5.8 g/dL — ABNORMAL LOW (ref 6.1–8.1)

## 2016-05-10 NOTE — Progress Notes (Signed)
Rachel Underwood is here for her Month 24 visit for Reprieve, A Randomized Trial to Prevent Vascular Events in HIV (study drug is Pitavastatin 4mg  or placebo). She says she has been seen by cardiology for palpitations few months ago and is now on Rythmol to help control them. She had apparently tried a course of flecainide which caused some swelling and then was switched to the rythmol. She also had a sleep apnea study which was normal. She says she has been taking her pitavastatin fairly regularly and has missed 1 dose out of the past 7. She is to return in September for the next study visit.

## 2016-05-10 NOTE — Addendum Note (Signed)
Addended by: Georges Lynch C on: 05/10/2016 11:26 AM   Modules accepted: Orders

## 2016-05-11 LAB — HIV-1 RNA QUANT-NO REFLEX-BLD

## 2016-05-11 NOTE — Addendum Note (Signed)
Addended by: Dolan Amen D on: 05/11/2016 09:30 AM   Modules accepted: Orders

## 2016-05-12 LAB — T-HELPER CELL (CD4) - (RCID CLINIC ONLY)
CD4 % Helper T Cell: 35 % (ref 33–55)
CD4 T Cell Abs: 710 /uL (ref 400–2700)

## 2016-05-18 ENCOUNTER — Ambulatory Visit: Payer: Medicaid Other | Admitting: Gastroenterology

## 2016-06-19 ENCOUNTER — Ambulatory Visit: Payer: Medicaid Other | Admitting: Family Medicine

## 2016-06-21 ENCOUNTER — Ambulatory Visit: Payer: Medicaid Other | Admitting: Family Medicine

## 2016-06-22 ENCOUNTER — Encounter: Payer: Self-pay | Admitting: Family Medicine

## 2016-06-26 ENCOUNTER — Telehealth: Payer: Self-pay | Admitting: Gastroenterology

## 2016-06-26 ENCOUNTER — Ambulatory Visit: Payer: Medicaid Other | Admitting: Gastroenterology

## 2016-06-26 ENCOUNTER — Encounter: Payer: Self-pay | Admitting: Gastroenterology

## 2016-06-26 NOTE — Telephone Encounter (Signed)
PT WAS A NO SHOW AND LETTER SENT  °

## 2016-07-03 ENCOUNTER — Telehealth: Payer: Self-pay | Admitting: Family Medicine

## 2016-07-03 NOTE — Telephone Encounter (Signed)
Covering PCP. Please advise

## 2016-07-03 NOTE — Telephone Encounter (Signed)
I don't see where we have referred her to pain management before. She was referred to physical medicine and rehab in 11/2015. She will need to be seen to evaluate for that referral or wait until Dr. Wendi Snipes comes back.

## 2016-07-03 NOTE — Telephone Encounter (Signed)
Patient aware.

## 2016-07-21 ENCOUNTER — Encounter: Payer: Self-pay | Admitting: Family Medicine

## 2016-07-21 ENCOUNTER — Ambulatory Visit (INDEPENDENT_AMBULATORY_CARE_PROVIDER_SITE_OTHER): Payer: Medicaid Other | Admitting: Family Medicine

## 2016-07-21 VITALS — BP 107/72 | HR 87 | Temp 97.7°F | Ht 66.0 in | Wt 277.0 lb

## 2016-07-21 DIAGNOSIS — G8929 Other chronic pain: Secondary | ICD-10-CM

## 2016-07-21 DIAGNOSIS — Z9119 Patient's noncompliance with other medical treatment and regimen: Secondary | ICD-10-CM

## 2016-07-21 DIAGNOSIS — I10 Essential (primary) hypertension: Secondary | ICD-10-CM

## 2016-07-21 DIAGNOSIS — Z91199 Patient's noncompliance with other medical treatment and regimen due to unspecified reason: Secondary | ICD-10-CM

## 2016-07-21 DIAGNOSIS — M549 Dorsalgia, unspecified: Secondary | ICD-10-CM

## 2016-07-21 DIAGNOSIS — G5603 Carpal tunnel syndrome, bilateral upper limbs: Secondary | ICD-10-CM | POA: Diagnosis not present

## 2016-07-21 NOTE — Patient Instructions (Signed)
Great to see you!  We will work on a referral to pain management  Be sure to start back your HIV meds right away  Be sure to see Dr. Baxter Flattery and your psychiatrist as scheduled

## 2016-07-21 NOTE — Progress Notes (Signed)
   HPI  Patient presents today requesting pain management referral.  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 She's been treated for quite a while with oxycodone from a local pain management clinic. She states that she's had a very bad experience at clinic, often having to wait 5-6 hours to be seen in filling out 20-30 pages paperwork at every visit. She has had a recent drug test positive for cocaine, however she states that the pain clinic has been working with her and that she's had a negative urine test and should be reinstated to get narcotics again in the next few weeks.  Patient has stopped all of her medications including her HIV medications for the last 5 months.  Also stopped all of her psychiatric medications, she has follow-up with ID on September 6, she has follow-up with psychiatry in about 1-1/2 weeks.   PMH: Smoking status noted ROS: Per HPI  Objective: BP 107/72   Pulse 87   Temp 97.7 F (36.5 C) (Oral)   Ht 5\' 6"  (1.676 m)   Wt 277 lb (125.6 kg)   LMP 08/11/2014   BMI 44.71 kg/m  Gen: NAD, alert, cooperative with exam HEENT: NCAT, EOMI, PERRL CV: RRR, good S1/S2, no murmur Resp: CTABL, no wheezes, non-labored Ext: No edema, warm Neuro: Alert and oriented, No gross deficits  Depression screen [facility] 2/9 07/21/2016 03/30/2016 02/08/2016 01/03/2016 11/10/2015  Decreased Interest 2 1 0 2 2  Down, Depressed, Hopeless 2 1 0 3 2  PHQ - 2 Score 4 2 0 5 4  Altered sleeping 2 - - 3 2  Tired, decreased energy 2 - - 3 2  Change in appetite 2 - - 3 2  Feeling bad or failure about yourself  1 - - 1 2  Trouble concentrating 1 - - 3 2  Moving slowly or fidgety/restless - - - 0 1  Suicidal thoughts 0 - - 0 0  PHQ-9 Score 12 - - 18 15  Difficult doing work/chores Somewhat difficult - - - Somewhat difficult  Some recent data might be hidden     Assessment and plan:  # Back pain, carpel tunnel  syndrome Chronic pain in several areas. Understand her complaints about her current pain medication clinic, however she has had a recent positive drug test and has significant mental illness. I have encouraged her to return to her psychiatrist which she has full intensity. I have referred her to another pain clinic. I've explained that it may be difficult to get placed there.  # Hypertension Low-pressure very reasonable today after stopping 5 months. Discontinue atenolol  # Non-Compliance Encouraged her to restart her HIV meds and her psychiatric medications after follow-up with psychiatrist.    Laroy Apple, MD [facility] 07/21/2016, 3:48 PM

## 2016-08-09 ENCOUNTER — Ambulatory Visit: Payer: Medicaid Other | Admitting: Internal Medicine

## 2016-08-09 ENCOUNTER — Encounter (INDEPENDENT_AMBULATORY_CARE_PROVIDER_SITE_OTHER): Payer: Medicaid Other | Admitting: *Deleted

## 2016-08-09 VITALS — BP 135/100 | HR 78 | Temp 98.4°F | Wt 278.0 lb

## 2016-08-09 DIAGNOSIS — Z006 Encounter for examination for normal comparison and control in clinical research program: Secondary | ICD-10-CM

## 2016-08-09 NOTE — Progress Notes (Signed)
Rachel Underwood is here for month 28 for Reprieve, A Randomized Trial to Prevent Vascular Events in HIV (study drug is Pitavastatin 4mg  or placebo). She denies any new problems but is asking for some lamisil for her nails. She says she is also breaking out around her buttocks. She is supposed to see Dr. Baxter Flattery next week. Her adherence for study meds was not good this week, she said she has been moving this week and got off schedule. She will return for study in January.

## 2016-08-11 ENCOUNTER — Telehealth: Payer: Self-pay

## 2016-08-11 NOTE — Telephone Encounter (Signed)
Pt called about referral status; I explained that multiple attempts had been made for an appointment and CPS had even sent a letter with no response from her. I offered her the number to call, but pt refused, stating she would have to call back later. I let her know that I would be documenting in her chart.

## 2016-08-15 ENCOUNTER — Ambulatory Visit: Payer: Medicaid Other | Admitting: Internal Medicine

## 2016-08-21 ENCOUNTER — Telehealth: Payer: Self-pay | Admitting: Family Medicine

## 2016-08-21 DIAGNOSIS — G8929 Other chronic pain: Secondary | ICD-10-CM

## 2016-08-21 DIAGNOSIS — M549 Dorsalgia, unspecified: Principal | ICD-10-CM

## 2016-08-21 NOTE — Telephone Encounter (Signed)
New referral written  Laroy Apple, MD [facility] 08/21/2016, 11:49 AM

## 2016-09-01 ENCOUNTER — Encounter: Payer: Self-pay | Admitting: Family Medicine

## 2016-09-04 ENCOUNTER — Ambulatory Visit: Payer: Medicaid Other | Admitting: Family Medicine

## 2016-09-18 ENCOUNTER — Telehealth: Payer: Self-pay | Admitting: *Deleted

## 2016-09-18 NOTE — Telephone Encounter (Signed)
Rachel Underwood called and said she had moved to [place], [place] and wanted to know if there was a research site there. She will be transferring her care there for ID soon. I checked and the closest site is [place]. I will let her know and she still has the option to come back here for study visits 2-3 times a year.

## 2016-09-25 ENCOUNTER — Telehealth: Payer: Self-pay | Admitting: Family Medicine

## 2016-09-25 MED ORDER — CYCLOBENZAPRINE HCL 10 MG PO TABS: 10.0000 mg | ORAL_TABLET | Freq: Three times a day (TID) | ORAL | 1 refills | Status: DC | PRN

## 2016-09-25 NOTE — Telephone Encounter (Signed)
Pt moved to [place] end of Sept, MCD just got straightened out & will not start till Nov 1st so she will not be able to find a new provider and pain mngt provider till after then. She does not want pain medication just needs muscle relaxer to be sent to [facility] in [place].

## 2016-09-25 NOTE — Telephone Encounter (Signed)
Pt aware Rx sent to pharmacy 

## 2016-09-25 NOTE — Telephone Encounter (Signed)
Unfortunately I am limited as to what I can do for her pain.  I have sent flexeril.   Otherwise reasonable OTC options are aleve and tylenol. Also topical creams like biofreeze and asper creme may e helpful.   Consider warm moist compresses.   Laroy Apple, MD [facility] 09/25/2016, 10:01 AM

## 2016-10-11 ENCOUNTER — Ambulatory Visit: Payer: Medicaid Other | Admitting: Internal Medicine

## 2016-10-23 ENCOUNTER — Telehealth: Payer: Self-pay | Admitting: Internal Medicine

## 2016-10-23 NOTE — Telephone Encounter (Signed)
Pt called asking to speak with LSL or nurse.  Patient has moved to [place], [place] and is having a hard time getting her medications filled. She said it would be [place] before she could be seen by a GI doctor there.  [phone]

## 2016-10-24 NOTE — Telephone Encounter (Signed)
Rachel Underwood, call the patient back and have her pharmacy send [place] a refill request, please

## 2016-10-24 NOTE — Telephone Encounter (Signed)
I called patient and she is going to call the pharmacy in [place] and have them send [place] a refill request/

## 2016-10-29 ENCOUNTER — Other Ambulatory Visit: Payer: Self-pay | Admitting: Gastroenterology

## 2016-10-30 ENCOUNTER — Other Ambulatory Visit: Payer: Self-pay | Admitting: *Deleted

## 2016-10-30 DIAGNOSIS — B20 Human immunodeficiency virus [HIV] disease: Secondary | ICD-10-CM

## 2016-10-30 MED ORDER — ELVITEG-COBIC-EMTRICIT-TENOFDF 150-150-200-300 MG PO TABS: 1.0000 | ORAL_TABLET | Freq: Every day | ORAL | 0 refills | Status: DC

## 2016-10-30 NOTE — Telephone Encounter (Signed)
Patient has recently moved permanently to [place], [place], October 2017.  Will give the patient a 30-day prescription for Strib[place] due to move to [place] so that she can have time to find a new provider.  Patient previously spoke with K. Epperson about this issue.

## 2016-11-14 DIAGNOSIS — F3170 Bipolar disorder, currently in remission, most recent episode unspecified: Secondary | ICD-10-CM | POA: Insufficient documentation

## 2016-11-23 ENCOUNTER — Telehealth: Payer: Self-pay | Admitting: Internal Medicine

## 2016-11-23 NOTE — Telephone Encounter (Signed)
Please give pt a call--having migraines. [phone]

## 2016-11-24 NOTE — Telephone Encounter (Signed)
Spoke with pt., she stated she has had a migraine for 4 days. I advised her to call her PCP.

## 2016-11-28 ENCOUNTER — Other Ambulatory Visit: Payer: Self-pay | Admitting: Internal Medicine

## 2016-11-28 DIAGNOSIS — B20 Human immunodeficiency virus [HIV] disease: Secondary | ICD-10-CM

## 2016-11-30 ENCOUNTER — Other Ambulatory Visit: Payer: Self-pay

## 2016-11-30 MED ORDER — LINACLOTIDE 290 MCG PO CAPS: 290.0000 ug | ORAL_CAPSULE | Freq: Every day | ORAL | 11 refills | Status: DC

## 2016-12-30 ENCOUNTER — Other Ambulatory Visit: Payer: Self-pay | Admitting: Internal Medicine

## 2016-12-30 DIAGNOSIS — B20 Human immunodeficiency virus [HIV] disease: Secondary | ICD-10-CM

## 2017-01-01 ENCOUNTER — Telehealth: Payer: Self-pay | Admitting: Family Medicine

## 2017-01-01 MED ORDER — CYCLOBENZAPRINE HCL 10 MG PO TABS: 10.0000 mg | ORAL_TABLET | Freq: Three times a day (TID) | ORAL | 1 refills | Status: DC | PRN

## 2017-01-01 NOTE — Addendum Note (Signed)
Addended by: Timmothy Euler on: 01/01/2017 05:10 PM   Modules accepted: Orders

## 2017-01-01 NOTE — Telephone Encounter (Signed)
done

## 2017-01-01 NOTE — Telephone Encounter (Signed)
Rx sent  Laroy Apple, MD [facility] 01/01/2017, 5:10 PM

## 2017-01-02 ENCOUNTER — Other Ambulatory Visit: Payer: Self-pay

## 2017-01-02 NOTE — Telephone Encounter (Signed)
Pt aware Rx sent to pharmacy 

## 2017-01-02 NOTE — Telephone Encounter (Signed)
Last seen 07/21/16, please advise.

## 2017-03-01 ENCOUNTER — Other Ambulatory Visit: Payer: Self-pay | Admitting: Gastroenterology

## 2017-04-10 ENCOUNTER — Encounter (INDEPENDENT_AMBULATORY_CARE_PROVIDER_SITE_OTHER): Payer: Medicaid Other | Admitting: *Deleted

## 2017-04-10 VITALS — BP 126/87 | HR 75 | Temp 98.1°F | Wt 256.5 lb

## 2017-04-10 DIAGNOSIS — Z006 Encounter for examination for normal comparison and control in clinical research program: Secondary | ICD-10-CM

## 2017-04-10 NOTE — Progress Notes (Signed)
Rachel Underwood is here for her month 36 visit for Reprieve. She has moved to [place] and lives wih her daughter and her daughter's step mother, who she helps take care of. She seems in good spirits and is pleased with her medical care at [facility]. She has been having neck and lower back pain and has been taking flexeril to help with that. She had runout of her meds for about 2 weeks but is taking them regularly now. She will be returning in August for the next visit.

## 2017-06-04 DIAGNOSIS — I10 Essential (primary) hypertension: Secondary | ICD-10-CM | POA: Diagnosis not present

## 2017-06-05 DIAGNOSIS — R0683 Snoring: Secondary | ICD-10-CM | POA: Diagnosis not present

## 2017-06-05 DIAGNOSIS — Z1389 Encounter for screening for other disorder: Secondary | ICD-10-CM | POA: Diagnosis not present

## 2017-06-14 DIAGNOSIS — Z01810 Encounter for preprocedural cardiovascular examination: Secondary | ICD-10-CM | POA: Diagnosis not present

## 2017-06-27 ENCOUNTER — Other Ambulatory Visit: Payer: Self-pay | Admitting: Nurse Practitioner

## 2017-07-09 ENCOUNTER — Encounter: Payer: Medicaid Other | Admitting: *Deleted

## 2017-07-11 ENCOUNTER — Encounter (INDEPENDENT_AMBULATORY_CARE_PROVIDER_SITE_OTHER): Payer: Medicaid Other | Admitting: *Deleted

## 2017-07-11 VITALS — BP 116/82 | HR 72 | Temp 98.5°F | Wt 261.5 lb

## 2017-07-11 DIAGNOSIS — Z006 Encounter for examination for normal comparison and control in clinical research program: Secondary | ICD-10-CM

## 2017-07-12 NOTE — Progress Notes (Signed)
Rachel Underwood is here for her month 40 visit for Reprieve, A Randomized Trial to Prevent Vascular Events in HIV (study drug is Pitavastatin 4mg  or placebo). She said she recently had an implantable device put in her chest to control her arrythmias. I am requesting records from her physician to determine if this is something that needs to be reported to Reprieve as an adjudicated event. She said she had it done as an outpatient at [facility]. She denies any other new problems, no issues with adherence.She has switched her ARVs to [employer]. She will be returning in December for the next study visit.

## 2017-07-19 DIAGNOSIS — Z79891 Long term (current) use of opiate analgesic: Secondary | ICD-10-CM | POA: Diagnosis not present

## 2017-07-19 DIAGNOSIS — Z5181 Encounter for therapeutic drug level monitoring: Secondary | ICD-10-CM | POA: Diagnosis not present

## 2017-07-19 DIAGNOSIS — M545 Low back pain: Secondary | ICD-10-CM | POA: Diagnosis not present

## 2017-07-19 DIAGNOSIS — Z4789 Encounter for other orthopedic aftercare: Secondary | ICD-10-CM | POA: Diagnosis not present

## 2017-08-29 ENCOUNTER — Other Ambulatory Visit: Payer: Self-pay | Admitting: Nurse Practitioner

## 2017-09-02 NOTE — Telephone Encounter (Signed)
Patient needs f/u ov

## 2017-09-03 ENCOUNTER — Encounter: Payer: Self-pay | Admitting: Gastroenterology

## 2017-10-22 ENCOUNTER — Ambulatory Visit: Payer: Self-pay | Admitting: Gastroenterology

## 2017-10-28 ENCOUNTER — Other Ambulatory Visit: Payer: Self-pay | Admitting: Gastroenterology

## 2017-10-30 ENCOUNTER — Encounter: Payer: Self-pay | Admitting: Gastroenterology

## 2017-10-30 NOTE — Telephone Encounter (Signed)
Patient is due OV. Last seen in 2016. Refill X2.

## 2017-10-30 NOTE — Telephone Encounter (Signed)
PATIENT SCHEDULED  °

## 2017-10-30 NOTE — Telephone Encounter (Signed)
Please schedule ov.  

## 2017-11-09 DIAGNOSIS — R1310 Dysphagia, unspecified: Secondary | ICD-10-CM | POA: Diagnosis not present

## 2017-11-09 DIAGNOSIS — K5900 Constipation, unspecified: Secondary | ICD-10-CM | POA: Diagnosis not present

## 2017-11-09 DIAGNOSIS — K219 Gastro-esophageal reflux disease without esophagitis: Secondary | ICD-10-CM | POA: Diagnosis not present

## 2017-11-21 ENCOUNTER — Encounter (INDEPENDENT_AMBULATORY_CARE_PROVIDER_SITE_OTHER): Payer: Medicaid Other | Admitting: *Deleted

## 2017-11-21 VITALS — BP 137/94 | HR 77 | Temp 98.3°F | Wt 267.0 lb

## 2017-11-21 DIAGNOSIS — Z006 Encounter for examination for normal comparison and control in clinical research program: Secondary | ICD-10-CM

## 2017-11-21 NOTE — Progress Notes (Signed)
Rachel Underwood is here today for her month 59 visit for Reprieve, A Randomized Trial to Prevent Vascular Events in HIV (study drug is Pitavastatin 4mg  or placebo). She says she has been very adherent with her study med and denies any muscle pain or weakness currently. She said she did pass out at home 4 days ago after being up for a few days in a manic state. She did not feel like it was related to her heart and did not seek medical care. She will be seeing her ID provider on the 27th and said she will send me her lab results. Her next visit is scheduled for 4/22.

## 2017-11-22 ENCOUNTER — Encounter: Payer: Medicaid Other | Admitting: *Deleted

## 2017-12-18 ENCOUNTER — Ambulatory Visit: Payer: Self-pay | Admitting: Gastroenterology

## 2018-01-08 DIAGNOSIS — K449 Diaphragmatic hernia without obstruction or gangrene: Secondary | ICD-10-CM | POA: Diagnosis not present

## 2018-01-08 DIAGNOSIS — Z6841 Body Mass Index (BMI) 40.0 and over, adult: Secondary | ICD-10-CM | POA: Diagnosis not present

## 2018-01-08 DIAGNOSIS — K219 Gastro-esophageal reflux disease without esophagitis: Secondary | ICD-10-CM | POA: Diagnosis not present

## 2018-01-14 DIAGNOSIS — M961 Postlaminectomy syndrome, not elsewhere classified: Secondary | ICD-10-CM | POA: Diagnosis not present

## 2018-01-14 DIAGNOSIS — M47897 Other spondylosis, lumbosacral region: Secondary | ICD-10-CM | POA: Diagnosis not present

## 2018-01-14 DIAGNOSIS — G894 Chronic pain syndrome: Secondary | ICD-10-CM | POA: Diagnosis not present

## 2018-01-14 DIAGNOSIS — Z79891 Long term (current) use of opiate analgesic: Secondary | ICD-10-CM | POA: Diagnosis not present

## 2018-01-14 DIAGNOSIS — M7918 Myalgia, other site: Secondary | ICD-10-CM | POA: Diagnosis not present

## 2018-01-14 DIAGNOSIS — M5020 Other cervical disc displacement, unspecified cervical region: Secondary | ICD-10-CM | POA: Diagnosis not present

## 2018-01-14 DIAGNOSIS — M542 Cervicalgia: Secondary | ICD-10-CM | POA: Diagnosis not present

## 2018-01-14 DIAGNOSIS — M47896 Other spondylosis, lumbar region: Secondary | ICD-10-CM | POA: Diagnosis not present

## 2018-03-25 ENCOUNTER — Encounter (INDEPENDENT_AMBULATORY_CARE_PROVIDER_SITE_OTHER): Payer: Medicaid Other | Admitting: *Deleted

## 2018-03-25 VITALS — BP 142/100 | HR 80 | Temp 98.7°F | Wt 258.5 lb

## 2018-03-25 DIAGNOSIS — Z006 Encounter for examination for normal comparison and control in clinical research program: Secondary | ICD-10-CM

## 2018-03-25 NOTE — Progress Notes (Signed)
Rachel Underwood was here for month 65 study visit for Reprieve.  She denies any new problems or issues with taking medications. She says her adherence is very good with study meds and she denies any muscle aches or weakness. She did have coffee with cream and sugar today when she was supposed to be fasting even though she was instructed not to eat or drink. But we are unable to get her back for lab testing since she lives so far away. She will be returning in August for the next study visit.

## 2018-06-04 ENCOUNTER — Ambulatory Visit: Payer: Self-pay | Admitting: Family Medicine

## 2018-06-10 ENCOUNTER — Ambulatory Visit: Payer: Medicaid Other | Admitting: Family Medicine

## 2018-06-10 NOTE — Progress Notes (Deleted)
Rachel Underwood is a 47 y.o. female presents to office today to reestablish care and for annual physical exam examination.  ***  Concerns today include: 1. ***  Occupation: ***, Marital status: ***, Substance use: *** Diet: ***, Exercise: *** Last eye exam: *** Last dental exam: *** Last colonoscopy: *** Last mammogram: *** Last pap smear: *** Refills needed today: *** Immunizations needed: Flu Vaccine: {YES/NO/WILD ENIDP:82423}  Tdap Vaccine: {YES/NO/WILD NTIRW:43154}  - every 48yrs - (<3 lifetime doses or unknown): all wounds -- look up need for Tetanus IG - (>=3 lifetime doses): clean/minor wound if >65yrs from previous; all other wounds if >24yrs from previous Zoster Vaccine: {YES/NO/WILD CARDS:18581} (those >50yo, once) Pneumonia Vaccine: {YES/NO/WILD MGQQP:61950} (those w/ risk factors) - (<92yr) Both: Immunocompromised, cochlear implant, CSF leak, asplenic, sickle cell, Chronic Renal Failure - (<81yr) PPSV-23 only: Heart dz, lung disease, DM, tobacco abuse, alcoholism, cirrhosis/liver disease. - (>1yr): PPSV13 then PPSV23 in 6-12mths;  - (>55yr): repeat PPSV23 once if pt received prior to 45yo and 4yrs have passed  Past Medical History:  Diagnosis Date  . Abnormal Pap smear of cervix 2005(EST)   ckc for abnl pap- [facility] [place]  . Allergy   . Asthma   . Bipolar 1 disorder ([place])   . Borderline personality disorder   . BPD (bronchopulmonary dysplasia)   . Carpal tunnel syndrome    bilateral wrist  . Depression   . Dysmenorrhea 06/03/2014  . Enlarged uterus 06/03/2014  . Fibromyalgia   . GERD (gastroesophageal reflux disease)   . Herpes simplex without mention of complication   . HIV (human immunodeficiency virus infection) ([place])   . Hyperlipidemia   . Hypertension   . Menorrhagia with irregular cycle 06/03/2014  . Osteoarthritis   . Seizures ([place])    had as a teenager, unknown etiology and no meds.   Social History   Socioeconomic History  . Marital  status: Divorced    Spouse name: Not on file  . Number of children: 3  . Years of education: Not on file  . Highest education level: Not on file  Occupational History  . Not on file  Social Needs  . Financial resource strain: Not on file  . Food insecurity:    Worry: Not on file    Inability: Not on file  . Transportation needs:    Medical: Not on file    Non-medical: Not on file  Tobacco Use  . Smoking status: Current Every Day Smoker    Packs/day: 0.50    Years: 30.00    Pack years: 15.00    Types: Cigarettes    Start date: 12/04/1982  . Smokeless tobacco: Never Used  Substance and Sexual Activity  . Alcohol use: No    Alcohol/week: 0.0 oz  . 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 Sexual activity: Yes    Partners: Male    Birth control/protection: Condom  Lifestyle  . Physical activity:    Days per week: Not on file    Minutes per session: Not on file  . Stress: Not on file  Relationships  . Social connections:    Talks on phone: Not on file    Gets together: Not on file    Attends religious service: Not on file    Active member of club or organization: Not on file  Attends meetings of clubs or organizations: Not on file    Relationship status: Not on file  . Intimate partner violence:    Fear of current or ex partner: Not on file    Emotionally abused: Not on file    Physically abused: Not on file    Forced sexual activity: Not on file  Other Topics Concern  . Not on file  Social History Narrative  . Not on file   Past Surgical History:  Procedure Laterality Date  . ABDOMINAL HYSTERECTOMY N/A 09/01/2014   Procedure: HYSTERECTOMY ABDOMINAL;  Surgeon: Jonnie Kind, MD;  Location: [facility];  Service: Gynecology;  Laterality: N/A;  . ANKLE SURGERY Right    repair of ligaments  . BACK SURGERY    . BILATERAL SALPINGECTOMY Bilateral 09/01/2014   Procedure:  BILATERAL SALPINGECTOMY;  Surgeon: Jonnie Kind, MD;  Location: [facility];  Service: Gynecology;  Laterality: Bilateral;  . CESAREAN SECTION    . ckc    . COLONOSCOPY N/A 12/23/2014   WPY:KDXI canal hemorrhoids left-sided diverticula  . ESOPHAGOGASTRODUODENOSCOPY N/A 12/23/2014   PJA:SNKNLZJ reflux/noncritical shatzkis ring s/p dilation/small HH  . EXCISION OF SKIN TAG N/A 09/01/2014   Procedure: EXCISION OF SKIN NEVUS;  Surgeon: Jonnie Kind, MD;  Location: [facility];  Service: Gynecology;  Laterality: N/A;  . FRACTURE SURGERY    . MALONEY DILATION N/A 12/23/2014   Procedure: Venia Minks DILATION;  Surgeon: Daneil Dolin, MD;  Location: [facility];  Service: Endoscopy;  Laterality: N/A;  . SAVORY DILATION N/A 12/23/2014   Procedure: SAVORY DILATION;  Surgeon: Daneil Dolin, MD;  Location: [facility];  Service: Endoscopy;  Laterality: N/A;  . SCAR REVISION N/A 09/01/2014   Procedure: EXCISION OF CICATRIX;  Surgeon: Jonnie Kind, MD;  Location: [facility];  Service: Gynecology;  Laterality: N/A;  . SHOULDER SURGERY Right    rotator cuff  . SPINE SURGERY     Family History  Problem Relation Age of Onset  . Fibroids Mother   . Hypertension Mother   . Depression Mother   . Irritable bowel syndrome Mother   . Arthritis Mother   . Heart disease Mother   . Mental illness Mother   . Stroke Mother   . Vision loss Mother   . Hypertension Father   . Mental illness Father   . Arthritis Father   . Heart disease Father   . Vision loss Father   . Heart disease Maternal Grandmother   . Cancer Maternal Grandmother        breast  . Depression Maternal Grandmother   . Hyperlipidemia Maternal Grandmother   . Hypertension Maternal Grandmother   . Bipolar disorder Daughter   . Schizophrenia Daughter   . Cancer Paternal Grandfather        prostate  . Heart disease Paternal Grandfather   . Hyperlipidemia Paternal Grandfather   . Hypertension Paternal Grandfather   . Dementia Paternal  Grandmother   . Hyperlipidemia Paternal Grandmother   . Hypertension Paternal Grandmother   . Heart disease Maternal Grandfather   . Alzheimer's disease Maternal Grandfather   . Hyperlipidemia Maternal Grandfather   . Hypertension Maternal Grandfather   . Anxiety disorder Daughter   . ADD / ADHD Son   . Hypertension Brother   . Early death Brother   . Learning disabilities Brother   . Mental illness Brother   . Colon cancer Neg Hx     Current Outpatient Medications:  .  acyclovir (ZOVIRAX)  400 MG tablet, Take 1 tablet (400 mg total) by mouth 2 (two) times daily as needed (for prophylaxis)., Disp: 60 tablet, Rfl: 5 .  albuterol (PROVENTIL HFA;VENTOLIN HFA) 108 (90 BASE) MCG/ACT inhaler, Inhale 2 puffs into the lungs every 4 (four) hours as needed for wheezing or shortness of breath. Reported on 04/06/2016, Disp: , Rfl:  .  cetirizine (ZYRTEC) 10 MG tablet, Take 1 tablet (10 mg total) by mouth daily. (Patient not taking: Reported on 08/09/2016), Disp: 30 tablet, Rfl: 5 .  cyclobenzaprine (FLEXERIL) 10 MG tablet, Take 1 tablet (10 mg total) by mouth 3 (three) times daily as needed for muscle spasms., Disp: 60 tablet, Rfl: 1 .  DEXILANT 60 MG capsule, TAKE ONE CAPSULE BY MOUTH ONE TIME DAILY, Disp: 30 capsule, Rfl: 2 .  EPIPEN 2-PAK 0.3 MG/0.3ML SOAJ injection, Inject 0.3 mLs (0.3 mg total) into the skin once., Disp: 1 Device, Rfl: 0 .  linaclotide (LINZESS) 290 MCG CAPS capsule, Take 1 capsule (290 mcg total) by mouth daily. On empty stomach, Disp: 30 capsule, Rfl: 11 .  montelukast (SINGULAIR) 10 MG tablet, Take 1 tablet (10 mg total) by mouth at bedtime. (Patient not taking: Reported on 07/21/2016), Disp: 30 tablet, Rfl: 3 .  ondansetron (ZOFRAN) 8 MG tablet, Take 1 tablet (8 mg total) by mouth every 8 (eight) hours as needed for nausea or vomiting., Disp: 60 tablet, Rfl: 2 .  oxyCODONE-acetaminophen (PERCOCET) 10-325 MG tablet, Take 1 tablet by mouth every 4 (four) hours as needed for pain.,  Disp: , Rfl:  .  Pitavastatin Calcium 4 MG TABS, Take 4 mg by mouth at bedtime. Pitavastatin 4mg  or placebo, Disp: , Rfl:  .  propafenone (RYTHMOL) 225 MG tablet, Take 1 tablet (225 mg total) by mouth every 8 (eight) hours. (Patient not taking: Reported on 08/09/2016), Disp: 60 tablet, Rfl: 11 .  ranitidine (ZANTAC) 150 MG tablet, TAKE ONE TABLET BY MOUTH AT BEDTIME, Disp: 30 tablet, Rfl: 3 .  sertraline (ZOLOFT) 100 MG tablet, Take 250 mg by mouth every morning. Takes 2 daily, Disp: , Rfl:  .  STRIBILD 150-150-200-300 MG TABS tablet, TAKE ONE TABLET BY MOUTH ONE TIME DAILY WITH BREAKFAST, Disp: 30 tablet, Rfl: 0 .  topiramate (TOPAMAX) 50 MG tablet, Take 1 tablet (50 mg total) by mouth 2 (two) times daily. (Patient not taking: Reported on 07/21/2016), Disp: 60 tablet, Rfl: 3  Allergies  Allergen Reactions  . Flecainide Swelling  . Naproxen Anaphylaxis and Hives  . Piroxicam Anaphylaxis  . Abilify [Aripiprazole]   . Citalopram Hives  . Codeine Nausea And Vomiting  . Depakote [Divalproex Sodium] Swelling  . Gabapentin     Hives   . Haldol [Haloperidol Lactate] Other (See Comments)    Slurs speech and "stumble"  . Indomethacin Swelling    Dizzy, tigling of face  . Invega [Paliperidone] Other (See Comments)    Slurred speech, drooling  . Latuda [Lurasidone Hcl] Itching  . Lithium Hives  . Promethazine Swelling    Arms and hands  . Trazodone And Nefazodone Other (See Comments)    Dry mouth, congestion, severe dryness.  [name] [Escitalopram Oxalate] Rash     ROS: Review of Systems {ros; complete:30496}    Physical exam {Exam, Complete:[phone]}    Assessment/ Plan: Kilea Hollifield here for annual physical exam.   No problem-specific Assessment & Plan notes found for this encounter.   Counseled on healthy lifestyle choices, including diet (rich in fruits, vegetables and lean meats and low in  salt and simple carbohydrates) and exercise (at least 30 minutes of moderate physical  activity daily).  Patient to follow up in 1 year for annual exam or sooner if needed.  Ashly M. Lajuana Ripple, DO

## 2018-06-11 ENCOUNTER — Encounter: Payer: Self-pay | Admitting: General Practice

## 2018-07-10 ENCOUNTER — Encounter (INDEPENDENT_AMBULATORY_CARE_PROVIDER_SITE_OTHER): Payer: Self-pay | Admitting: *Deleted

## 2018-07-10 VITALS — BP 124/85 | HR 75 | Temp 98.2°F | Wt 247.2 lb

## 2018-07-10 DIAGNOSIS — Z006 Encounter for examination for normal comparison and control in clinical research program: Secondary | ICD-10-CM

## 2018-07-10 NOTE — Progress Notes (Signed)
Rachel Underwood was here for her month 53 visit for Reprieve. She denies any new problems or medications and Is getting ready to move to [place], [place]. She will need to to transfer her research study to [facility] and is aware of that process. She did sign a release of information for that. Here adherence has been good with study meds and her HIV meds. I will start the process of getting her transfer in place.

## 2018-07-11 DIAGNOSIS — H40033 Anatomical narrow angle, bilateral: Secondary | ICD-10-CM | POA: Diagnosis not present

## 2018-07-11 DIAGNOSIS — H1013 Acute atopic conjunctivitis, bilateral: Secondary | ICD-10-CM | POA: Diagnosis not present

## 2018-07-13 DIAGNOSIS — H5213 Myopia, bilateral: Secondary | ICD-10-CM | POA: Diagnosis not present

## 2019-05-26 ENCOUNTER — Other Ambulatory Visit: Payer: Self-pay | Admitting: *Deleted

## 2019-05-26 ENCOUNTER — Ambulatory Visit: Payer: Medicaid Other

## 2019-05-26 ENCOUNTER — Other Ambulatory Visit: Payer: Self-pay

## 2019-05-26 ENCOUNTER — Other Ambulatory Visit: Payer: Medicaid Other

## 2019-05-26 ENCOUNTER — Other Ambulatory Visit (HOSPITAL_COMMUNITY)
Admission: RE | Admit: 2019-05-26 | Discharge: 2019-05-26 | Disposition: A | Discharge status: Home / Self Care | Payer: Medicaid Other | Source: Ambulatory Visit | Attending: Infectious Disease | Admitting: Infectious Disease

## 2019-05-26 DIAGNOSIS — B20 Human immunodeficiency virus [HIV] disease: Secondary | ICD-10-CM

## 2019-05-27 LAB — T-HELPER CELL (CD4) - (RCID CLINIC ONLY)
CD4 % Helper T Cell: 25 % — ABNORMAL LOW (ref 33–65)
CD4 T Cell Abs: 331 /uL — ABNORMAL LOW (ref 400–1790)

## 2019-05-27 LAB — URINE CYTOLOGY ANCILLARY ONLY
Chlamydia: NEGATIVE
Neisseria Gonorrhea: NEGATIVE

## 2019-06-02 LAB — COMPLETE METABOLIC PANEL WITH GFR
AG Ratio: 1.6 (calc) (ref 1.0–2.5)
ALT: 9 U/L (ref 6–29)
AST: 13 U/L (ref 10–35)
Albumin: 4.1 g/dL (ref 3.6–5.1)
Alkaline phosphatase (APISO): 46 U/L (ref 31–125)
BUN: 17 mg/dL (ref 7–25)
CO2: 29 mmol/L (ref 20–32)
Calcium: 9.8 mg/dL (ref 8.6–10.2)
Chloride: 103 mmol/L (ref 98–110)
Creat: 0.86 mg/dL (ref 0.50–1.10)
GFR, Est African American: 94 mL/min/{1.73_m2} (ref >=60)
GFR, Est Non African American: 81 mL/min/{1.73_m2} (ref >=60)
Globulin: 2.6 g/dL (calc) (ref 1.9–3.7)
Glucose, Bld: 105 mg/dL — ABNORMAL HIGH (ref 65–99)
Potassium: 4.7 mmol/L (ref 3.5–5.3)
Sodium: 141 mmol/L (ref 135–146)
Total Bilirubin: 0.4 mg/dL (ref 0.2–1.2)
Total Protein: 6.7 g/dL (ref 6.1–8.1)

## 2019-06-02 LAB — CBC WITH DIFFERENTIAL/PLATELET
Absolute Monocytes: 353 cells/uL (ref 200–950)
Basophils Absolute: 17 cells/uL (ref 0–200)
Basophils Relative: 0.2 %
Eosinophils Absolute: 17 cells/uL (ref 15–500)
Eosinophils Relative: 0.2 %
HCT: 39.2 % (ref 35.0–45.0)
Hemoglobin: 13.3 g/dL (ref 11.7–15.5)
Lymphs Abs: 1386 cells/uL (ref 850–3900)
MCH: 33.2 pg — ABNORMAL HIGH (ref 27.0–33.0)
MCHC: 33.9 g/dL (ref 32.0–36.0)
MCV: 97.8 fL (ref 80.0–100.0)
MPV: 9.4 fL (ref 7.5–12.5)
Monocytes Relative: 4.2 %
Neutro Abs: 6628 cells/uL (ref 1500–7800)
Neutrophils Relative %: 78.9 %
Platelets: 210 10*3/uL (ref 140–400)
RBC: 4.01 10*6/uL (ref 3.80–5.10)
RDW: 12.3 % (ref 11.0–15.0)
Total Lymphocyte: 16.5 %
WBC: 8.4 10*3/uL (ref 3.8–10.8)

## 2019-06-02 LAB — RPR: RPR Ser Ql: NONREACTIVE

## 2019-06-02 LAB — HIV-1 RNA ULTRAQUANT REFLEX TO GENTYP+
HIV 1 RNA Quant: <20 copies/mL
HIV-1 RNA Quant, Log: <1.3 Log copies/mL

## 2019-06-19 ENCOUNTER — Ambulatory Visit: Payer: Medicaid Other | Admitting: Pharmacist

## 2019-06-19 ENCOUNTER — Ambulatory Visit: Payer: Medicaid Other | Admitting: Internal Medicine

## 2019-06-19 ENCOUNTER — Encounter: Payer: Medicaid Other | Admitting: Internal Medicine

## 2019-06-19 ENCOUNTER — Other Ambulatory Visit: Payer: Self-pay

## 2019-06-20 ENCOUNTER — Ambulatory Visit (INDEPENDENT_AMBULATORY_CARE_PROVIDER_SITE_OTHER): Payer: Medicaid Other | Admitting: Family Medicine

## 2019-06-20 ENCOUNTER — Encounter: Payer: Self-pay | Admitting: Family Medicine

## 2019-06-20 VITALS — BP 112/75 | HR 73 | Temp 99.4°F | Ht 66.0 in | Wt 247.0 lb

## 2019-06-20 DIAGNOSIS — M25562 Pain in left knee: Secondary | ICD-10-CM | POA: Diagnosis not present

## 2019-06-20 DIAGNOSIS — Z124 Encounter for screening for malignant neoplasm of cervix: Secondary | ICD-10-CM

## 2019-06-20 DIAGNOSIS — K2100 Gastro-esophageal reflux disease with esophagitis, without bleeding: Secondary | ICD-10-CM

## 2019-06-20 DIAGNOSIS — L304 Erythema intertrigo: Secondary | ICD-10-CM | POA: Diagnosis not present

## 2019-06-20 DIAGNOSIS — F3170 Bipolar disorder, currently in remission, most recent episode unspecified: Secondary | ICD-10-CM

## 2019-06-20 DIAGNOSIS — Z840 Family history of diseases of the skin and subcutaneous tissue: Secondary | ICD-10-CM | POA: Diagnosis not present

## 2019-06-20 DIAGNOSIS — R8290 Unspecified abnormal findings in urine: Secondary | ICD-10-CM

## 2019-06-20 DIAGNOSIS — B20 Human immunodeficiency virus [HIV] disease: Secondary | ICD-10-CM | POA: Diagnosis not present

## 2019-06-20 DIAGNOSIS — I10 Essential (primary) hypertension: Secondary | ICD-10-CM | POA: Diagnosis not present

## 2019-06-20 DIAGNOSIS — Z01419 Encounter for gynecological examination (general) (routine) without abnormal findings: Secondary | ICD-10-CM | POA: Diagnosis not present

## 2019-06-20 DIAGNOSIS — Z1239 Encounter for other screening for malignant neoplasm of breast: Secondary | ICD-10-CM

## 2019-06-20 LAB — URINALYSIS
Bilirubin, UA: NEGATIVE
Glucose, UA: NEGATIVE
Ketones, UA: NEGATIVE
Leukocytes,UA: NEGATIVE
Nitrite, UA: NEGATIVE
Protein,UA: NEGATIVE
RBC, UA: NEGATIVE
Specific Gravity, UA: 1.01 (ref 1.005–1.030)
Urobilinogen, Ur: 0.2 mg/dL (ref 0.2–1.0)
pH, UA: 5 (ref 5.0–7.5)

## 2019-06-20 MED ORDER — DEXILANT 60 MG PO CPDR: 1.0000 | DELAYED_RELEASE_CAPSULE | Freq: Every day | ORAL | 2 refills | Status: DC

## 2019-06-20 MED ORDER — NYSTATIN 100000 UNIT/GM EX POWD: Freq: Three times a day (TID) | CUTANEOUS | 1 refills | Status: DC

## 2019-06-20 MED ORDER — LAMOTRIGINE 200 MG PO TABS: 200.0000 mg | ORAL_TABLET | Freq: Two times a day (BID) | ORAL | 3 refills | Status: DC

## 2019-06-20 NOTE — Progress Notes (Signed)
Rachel Underwood is a 48 y.o. female presents to office today for annual physical exam examination.    Concerns today include: 1.  Mood disorder Patient reports that she was being followed by [facility] for mood disorder.  She had her Zoloft discontinued and was started on Lamictal 200 mg twice daily.  She needs refills on this.  She reports good control of symptoms with the Lamictal.  She has not yet established with a new psychiatrist locally but would like to.  2.  Chronic left knee pain Patient reports chronic left-sided knee pain that onset after she twisted her knee.  She is had some instability of that left side and would like to see an orthopedist.  She was previously cared for by an orthopedist in [place], Dr. Case, for her right ankle.  3.  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  About 5 to 6 years ago she had a total hysterectomy with Dr. Glo Herring in [place].  She is here for pelvic.  Denies any abnormal vaginal symptoms except for occasional whitish discharge but denies any itching or vaginal lesions.  She is not sexually active.  4.  Intertrigo Patient reports a rash under her breast and in her groin area with increased heat.  She tries to keep this area dry but notes it typically needs nystatin powder.  She is asking that I sent a refill of this.  5.  GERD Patient with history of Schatzki's ring and GERD.  She is on Dexilant which typically controls her reflux symptoms well.  She has follow-up with gastroenterology soon but is out of her medicine.  No melena, hematochezia.  She has chronic constipation.  6.  Family history of lupus Patient reports that her daughter and another family member were recently diagnosed with lupus.  She denies any new joint pain, joint swelling, malar rashes.  She simply wants to have testing for this to evaluate further.  Diet: no structured, Exercise: no structured Last eye exam: DUE  Last dental exam: needs Last mammogram: needs Last pap smear: needs Refills needed today: Lamictal dexilant  Past Medical History:  Diagnosis Date  . Abnormal Pap smear of cervix 2005(EST)   ck[facility] for abnl pap- [facility] [place]  . Allergy   . Asthma   . Bipolar 1 disorder ([place])   . Borderline personality disorder ([place])   . BPD (bronchopulmonary dysplasia)   . Carpal tunnel syndrome    bilateral wrist  . Depression   . Dysmenorrhea 06/03/2014  . Enlarged uterus 06/03/2014  . Fibromyalgia   . GERD (gastroesophageal reflux disease)   . Herpes simplex without mention of complication   . HIV (human immunodeficiency virus infection) ([place])   . Hyperlipidemia   . Hypertension   . Menorrhagia with irregular cycle 06/03/2014  . Osteoarthritis   . Seizures ([place])    had as a teenager, unknown etiology and no meds.   Social History   Socioeconomic History  . Marital status: Divorced    Spouse name: Not on file  . Number of children: 3  . Years of education: Not on file  . Highest education level: Not on file  Occupational History  . Not on file  Social Needs  . Financial resource strain: Not on file  . Food insecurity    Worry: Not on file    Inability: Not on file  . Transportation needs    Medical: Not on  file    Non-medical: Not on file  Tobacco Use  . Smoking status: Current Every Day Smoker    Packs/day: 0.50    Years: 30.00    Pack years: 15.00    Types: Cigarettes    Start date: 12/04/1982  . Smokeless tobacco: Never Used  Substance and Sexual Activity  . Alcohol use: No    Alcohol/week: 0.0 standard drinks  . 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 Sexual activity: Yes    Partners: Male    Birth control/protection: Condom  Lifestyle  . Physical activity    Days per week: Not on file    Minutes per session: Not on file  . Stress: Not on file   Relationships  . Social Herbalist on phone: Not on file    Gets together: Not on file    Attends religious service: Not on file    Active member of club or organization: Not on file    Attends meetings of clubs or organizations: Not on file    Relationship status: Not on file  . Intimate partner violence    Fear of current or ex partner: Not on file    Emotionally abused: Not on file    Physically abused: Not on file    Forced sexual activity: Not on file  Other Topics Concern  . Not on file  Social History Narrative  . Not on file   Past Surgical History:  Procedure Laterality Date  . ABDOMINAL HYSTERECTOMY N/A 09/01/2014   Procedure: HYSTERECTOMY ABDOMINAL;  Surgeon: Jonnie Kind, MD;  Location: [facility];  Service: Gynecology;  Laterality: N/A;  . ANKLE SURGERY Right    repair of ligaments  . BACK SURGERY    . BILATERAL SALPINGECTOMY Bilateral 09/01/2014   Procedure: BILATERAL SALPINGECTOMY;  Surgeon: Jonnie Kind, MD;  Location: [facility];  Service: Gynecology;  Laterality: Bilateral;  . CESAREAN SECTION    . ckc    . COLONOSCOPY N/A 12/23/2014   JJO:ACZY canal hemorrhoids left-sided diverticula  . ESOPHAGOGASTRODUODENOSCOPY N/A 12/23/2014   SAY:TKZSWFU reflux/noncritical shatzkis ring s/p dilation/small HH  . EXCISION OF SKIN TAG N/A 09/01/2014   Procedure: EXCISION OF SKIN NEVUS;  Surgeon: Jonnie Kind, MD;  Location: [facility];  Service: Gynecology;  Laterality: N/A;  . FRACTURE SURGERY    . MALONEY DILATION N/A 12/23/2014   Procedure: Venia Minks DILATION;  Surgeon: Daneil Dolin, MD;  Location: [facility];  Service: Endoscopy;  Laterality: N/A;  . SAVORY DILATION N/A 12/23/2014   Procedure: SAVORY DILATION;  Surgeon: Daneil Dolin, MD;  Location: [facility];  Service: Endoscopy;  Laterality: N/A;  . SCAR REVISION N/A 09/01/2014   Procedure: EXCISION OF CICATRIX;  Surgeon: Jonnie Kind, MD;  Location: [facility];  Service: Gynecology;  Laterality: N/A;  .  SHOULDER SURGERY Right    rotator cuff  . SPINE SURGERY     Family History  Problem Relation Age of Onset  . Fibroids Mother   . Hypertension Mother   . Depression Mother   . Irritable bowel syndrome Mother   . Arthritis Mother   . Heart disease Mother   . Mental illness Mother   . Stroke Mother   . Vision loss Mother   . Hypertension Father   . Mental illness Father   .  Arthritis Father   . Heart disease Father   . Vision loss Father   . Heart disease Maternal Grandmother   . Cancer Maternal Grandmother        breast  . Depression Maternal Grandmother   . Hyperlipidemia Maternal Grandmother   . Hypertension Maternal Grandmother   . Bipolar disorder Daughter   . Schizophrenia Daughter   . Cancer Paternal Grandfather        prostate  . Heart disease Paternal Grandfather   . Hyperlipidemia Paternal Grandfather   . Hypertension Paternal Grandfather   . Dementia Paternal Grandmother   . Hyperlipidemia Paternal Grandmother   . Hypertension Paternal Grandmother   . Heart disease Maternal Grandfather   . Alzheimer's disease Maternal Grandfather   . Hyperlipidemia Maternal Grandfather   . Hypertension Maternal Grandfather   . Anxiety disorder Daughter   . ADD / ADHD Son   . Hypertension Brother   . Early death Brother   . Learning disabilities Brother   . Mental illness Brother   . Colon cancer Neg Hx     Current Outpatient Medications:  .  acyclovir (ZOVIRAX) 400 MG tablet, Take 1 tablet (400 mg total) by mouth 2 (two) times daily as needed (for prophylaxis)., Disp: 60 tablet, Rfl: 5 .  albuterol (PROVENTIL HFA;VENTOLIN HFA) 108 (90 BASE) MCG/ACT inhaler, Inhale 2 puffs into the lungs every 4 (four) hours as needed for wheezing or shortness of breath. Reported on 04/06/2016, Disp: , Rfl:  .  DEXILANT 60 MG capsule, TAKE ONE CAPSULE BY MOUTH ONE TIME DAILY, Disp: 30 capsule, Rfl: 2 .  EPIPEN 2-PAK 0.3 MG/0.3ML SOAJ injection, Inject 0.3 mLs (0.3 mg total) into the skin  once., Disp: 1 Device, Rfl: 0 .  furosemide (LASIX) 20 MG tablet, Take 20 mg by mouth daily., Disp: , Rfl:  .  lamoTRIgine (LAMICTAL) 200 MG tablet, Take 200 mg by mouth 2 (two) times daily., Disp: , Rfl:  .  linaclotide (LINZESS) 290 MCG CAPS capsule, Take 1 capsule (290 mcg total) by mouth daily. On empty stomach, Disp: 30 capsule, Rfl: 11 .  ondansetron (ZOFRAN) 8 MG tablet, Take 1 tablet (8 mg total) by mouth every 8 (eight) hours as needed for nausea or vomiting., Disp: 60 tablet, Rfl: 2 .  propranolol (INDERAL) 10 MG tablet, Take 10 mg by mouth 2 (two) times daily., Disp: , Rfl:  .  topiramate (TOPAMAX) 50 MG tablet, Take 1 tablet (50 mg total) by mouth 2 (two) times daily. (Patient taking differently: Take 100 mg by mouth 2 (two) times daily. ), Disp: 60 tablet, Rfl: 3 .  cetirizine (ZYRTEC) 10 MG tablet, Take 1 tablet (10 mg total) by mouth daily. (Patient not taking: Reported on 08/09/2016), Disp: 30 tablet, Rfl: 5 .  sertraline (ZOLOFT) 100 MG tablet, Take 250 mg by mouth every morning. Takes 2 daily, Disp: , Rfl:   Allergies  Allergen Reactions  . Flecainide Swelling  . Naproxen Anaphylaxis and Hives  . Piroxicam Anaphylaxis  . Abilify [Aripiprazole]   . Citalopram Hives  . Codeine Nausea And Vomiting  . Depakote [Divalproex Sodium] Swelling  . Gabapentin     Hives   . Haldol [Haloperidol Lactate] Other (See Comments)    Slurs speech and "stumble"  . Indomethacin Swelling    Dizzy, tigling of face  . Invega [Paliperidone] Other (See Comments)    Slurred speech, drooling  . Latuda [Lurasidone Hcl] Itching  . Lithium Hives  . Promethazine Swelling    Arms  and hands  . Trazodone And Nefazodone Other (See Comments)    Dry mouth, congestion, severe dryness.  [name] [Escitalopram Oxalate] Rash     ROS: Review of Systems Constitutional: negative Eyes: negative Ears, nose, mouth, throat, and face: positive for hearing loss Respiratory: negative Cardiovascular: negative  Gastrointestinal: positive for constipation and dyspepsia Genitourinary:negative Integument/breast: negative Hematologic/lymphatic: negative Musculoskeletal:positive for arthralgias Neurological: negative Behavioral/Psych: positive for anxiety and mood swings Endocrine: negative Allergic/Immunologic: negative    Physical exam BP 112/75   Pulse 73   Temp 99.4 F (37.4 C) (Oral)   Ht 5\' 6"  (1.676 m)   Wt 247 lb (112 kg)   LMP 08/11/2014   BMI 39.87 kg/m  General appearance: alert, cooperative, appears stated age, no distress and morbidly obese Head: Normocephalic, without obvious abnormality, atraumatic Eyes: negative findings: lids and lashes normal, conjunctivae and sclerae normal, corneas clear and pupils equal, round, reactive to light and accomodation Ears: normal TM's and external ear canals both ears Nose: Nares normal. Septum midline. Mucosa normal. No drainage or sinus tenderness. Throat: lips, mucosa, and tongue normal; teeth and gums normal Neck: no adenopathy, supple, symmetrical, trachea midline and thyroid not enlarged, symmetric, no tenderness/mass/nodules Back: symmetric, no curvature. ROM normal. No CVA tenderness. Lungs: clear to auscultation bilaterally Heart: regular rate and rhythm, S1, S2 normal, no murmur, click, rub or gallop Abdomen: soft, non-tender; bowel sounds normal; no masses,  no organomegaly Pelvic: no adnexal masses or tenderness, rectovaginal septum normal, uterus normal size, shape, and consistency, vagina normal without discharge and Cervix surgically absent.  She has a couple of blue pigmented lesions along the external vaginal surface particularly on the right labia majora. Extremities: extremities normal, atraumatic, no cyanosis or edema; no gross swelling noted of the left knee.  No appreciable bony abnormalities. Pulses: 2+ and symmetric Skin: Skin color, texture, turgor normal. No rashes or lesions Lymph nodes: Cervical, supraclavicular,  and axillary nodes normal. Neurologic: Alert and oriented X 3, normal strength and tone. Normal symmetric reflexes. Normal coordination and gait Psych: Mood stable, speech normal, affect appropriate, does not appear to be responding to internal stimuli. Depression screen [facility] 2/9 06/20/2019 07/21/2016 03/30/2016  Decreased Interest 0 2 1  Down, Depressed, Hopeless 0 2 1  PHQ - 2 Score 0 4 2  Altered sleeping 0 2 -  Tired, decreased energy 0 2 -  Change in appetite 0 2 -  Feeling bad or failure about yourself  0 1 -  Trouble concentrating 0 1 -  Moving slowly or fidgety/restless 0 - -  Suicidal thoughts 0 0 -  PHQ-9 Score 0 12 -  Difficult doing work/chores - Somewhat difficult -  Some recent data might be hidden   No results found for this or any previous visit (from the past 24 hour(s)).   Assessment/ Plan: Rachel Underwood here for annual physical exam.   1. Well woman exam with routine gynecological exam We will check pathology of the vaginal canal.  Cervix surgically absent.  Would consider follow-up with OB/GYN.  Could not find where she had a vaginal cancer in the past.  I question if this was a cervical cancer. - Pap IG, CT/NG NAA, and HPV (high risk) [facility]  2. Screening for malignant neoplasm of cervix - Pap IG, CT/NG NAA, and HPV (high risk) [facility]  3. Breast cancer screening We will obtain at [place]; Future  4. Essential hypertension Controlled  5. Human immunodeficiency virus (HIV)  disease ([place]) Followed by ID.  On antivirals  6. Abnormal urine odor - Urinalysis  7. Acute pain of left knee Referred to orthopedics in [facility] - Ambulatory referral to Orthopedic Surgery  8. Bipolar disorder in partial remission, most recent episode unspecified type ([place]) Stable.  Lamictal refilled until she can establish with psychiatry.  I reviewed her most recent lab results which indicated normal renal, liver function and normal CBC.  - Ambulatory referral to Psychiatry - lamoTRIgine (LAMICTAL) 200 MG tablet; Take 1 tablet (200 mg total) by mouth 2 (two) times daily.  Dispense: 60 tablet; Refill: 3  9. Family history of lupus erythematosus Wants to have testing for autoimmune disease.  Apparently 2 family members have been recently diagnosed with SLE. - ANA w/Reflex if Positive  10. Morbid obesity ([place]) - Lipid Panel  11. Gastroesophageal reflux disease with esophagitis Stable. Followed by GI - dexlansoprazole (DEXILANT) 60 MG capsule; Take 1 capsule (60 mg total) by mouth daily.  Dispense: 30 capsule; Refill: 2  12. Intertrigo - nystatin (MYCOSTATIN/NYSTOP) powder; Apply topically 3 (three) times daily. For up to 2 weeks for rash under breast  Dispense: 30 g; Refill: 1   Handout provided on healthy lifestyle choices, including diet (rich in fruits, vegetables and lean meats and low in salt and simple carbohydrates) and exercise (at least 30 minutes of moderate physical activity daily).  Patient to follow up in 1 year for annual exam or sooner if needed.  Harrel Ferrone M. Lajuana Ripple, DO

## 2019-06-20 NOTE — Patient Instructions (Signed)
You had labs performed today.  You will be contacted with the results of the labs once they are available, usually in the next 3 business days for routine lab work.  If you have an active my chart account, they will be released to your MyChart.  If you prefer to have these labs released to you via telephone, please let us know.  If you had a pap smear or biopsy performed, expect to be contacted in about 7-10 days.  Get your mammogram scheduled at [facility].  Referral in for orthopedics in [facility] Referral in for psychiatry in [place]  Call your old cardiologist for follow up visit on your monitor.   Preventive Care 82-46 Years Old, Female Preventive care refers to visits with your health care provider and lifestyle choices that can promote health and wellness. This includes:  A yearly physical exam. This may also be called an annual well check.  Regular dental visits and eye exams.  Immunizations.  Screening for certain conditions.  Healthy lifestyle choices, such as eating a healthy diet, getting regular exercise, not using drugs or products that contain nicotine and tobacco, and limiting alcohol use. What can I expect for my preventive care visit? Physical exam Your health care provider will check your:  Height and weight. This may be used to calculate body mass index (BMI), which tells if you are at a healthy weight.  Heart rate and blood pressure.  Skin for abnormal spots. Counseling Your health care provider may ask you questions about your:  Alcohol, tobacco, and drug use.  Emotional well-being.  Home and relationship well-being.  Sexual activity.  Eating habits.  Work and work Statistician.  Method of birth control.  Menstrual cycle.  Pregnancy history. What immunizations do I need?  Influenza (flu) vaccine  This is recommended every year. Tetanus, diphtheria, and pertussis (Tdap) vaccine  You may need a Td booster every 10 years. Varicella  (chickenpox) vaccine  You may need this if you have not been vaccinated. Zoster (shingles) vaccine  You may need this after age 67. Measles, mumps, and rubella (MMR) vaccine  You may need at least one dose of MMR if you were born in 1957 or later. You may also need a second dose. Pneumococcal conjugate (PCV13) vaccine  You may need this if you have certain conditions and were not previously vaccinated. Pneumococcal polysaccharide (PPSV23) vaccine  You may need one or two doses if you smoke cigarettes or if you have certain conditions. Meningococcal conjugate (MenACWY) vaccine  You may need this if you have certain conditions. Hepatitis A vaccine  You may need this if you have certain conditions or if you travel or work in places where you may be exposed to hepatitis A. Hepatitis B vaccine  You may need this if you have certain conditions or if you travel or work in places where you may be exposed to hepatitis B. Haemophilus influenzae type b (Hib) vaccine  You may need this if you have certain conditions. Human papillomavirus (HPV) vaccine  If recommended by your health care provider, you may need three doses over 6 months. You may receive vaccines as individual doses or as more than one vaccine together in one shot (combination vaccines). Talk with your health care provider about the risks and benefits of combination vaccines. What tests do I need? Blood tests  Lipid and cholesterol levels. These may be checked every 5 years, or more frequently if you are over 39 years old.  Hepatitis C  test.  Hepatitis B test. Screening  Lung cancer screening. You may have this screening every year starting at age 45 if you have a 30-pack-year history of smoking and currently smoke or have quit within the past 15 years.  Colorectal cancer screening. All adults should have this screening starting at age 52 and continuing until age 94. Your health care provider may recommend screening at  age 74 if you are at increased risk. You will have tests every 1-10 years, depending on your results and the type of screening test.  Diabetes screening. This is done by checking your blood sugar (glucose) after you have not eaten for a while (fasting). You may have this done every 1-3 years.  Mammogram. This may be done every 1-2 years. Talk with your health care provider about when you should start having regular mammograms. This may depend on whether you have a family history of breast cancer.  BRCA-related cancer screening. This may be done if you have a family history of breast, ovarian, tubal, or peritoneal cancers.  Pelvic exam and Pap test. This may be done every 3 years starting at age 36. Starting at age 3, this may be done every 5 years if you have a Pap test in combination with an HPV test. Other tests  Sexually transmitted disease (STD) testing.  Bone density scan. This is done to screen for osteoporosis. You may have this scan if you are at high risk for osteoporosis. Follow these instructions at home: Eating and drinking  Eat a diet that includes fresh fruits and vegetables, whole grains, lean protein, and low-fat dairy.  Take vitamin and mineral supplements as recommended by your health care provider.  Do not drink alcohol if: ? Your health care provider tells you not to drink. ? You are pregnant, may be pregnant, or are planning to become pregnant.  If you drink alcohol: ? Limit how much you have to 0-1 drink a day. ? Be aware of how much alcohol is in your drink. In the [place] one drink equals one 12 oz bottle of beer (355 mL), one 5 oz glass of wine (148 mL), or one 1 oz glass of hard liquor (44 mL). Lifestyle  Take daily care of your teeth and gums.  Stay active. Exercise for at least 30 minutes on 5 or more days each week.  Do not use any products that contain nicotine or tobacco, such as cigarettes, e-cigarettes, and chewing tobacco. If you need help quitting,  ask your health care provider.  If you are sexually active, practice safe sex. Use a condom or other form of birth control (contraception) in order to prevent pregnancy and STIs (sexually transmitted infections).  If told by your health care provider, take low-dose aspirin daily starting at age 95. What's next?  Visit your health care provider once a year for a well check visit.  Ask your health care provider how often you should have your eyes and teeth checked.  Stay up to date on all vaccines. This information is not intended to replace advice given to you by your health care provider. Make sure you discuss any questions you have with your health care provider. Document Released: 12/17/2015 Document Revised: 08/01/2018 Document Reviewed: 08/01/2018 Elsevier Patient Education  2020 [employer].

## 2019-06-21 LAB — ANA W/REFLEX IF POSITIVE
Anti JO-1: <0.2 AI (ref 0.0–0.9)
Anti Nuclear Antibody (ANA): POSITIVE — AB
Centromere Ab Screen: 2 AI — ABNORMAL HIGH (ref 0.0–0.9)
Chromatin Ab SerPl-aCnc: <0.2 AI (ref 0.0–0.9)
ENA RNP Ab: <0.2 AI (ref 0.0–0.9)
ENA SM Ab Ser-aCnc: <0.2 AI (ref 0.0–0.9)
ENA SSA (RO) Ab: <0.2 AI (ref 0.0–0.9)
ENA SSB (LA) Ab: <0.2 AI (ref 0.0–0.9)
Scleroderma (Scl-70) (ENA) Antibody, IgG: <0.2 AI (ref 0.0–0.9)
dsDNA Ab: <1 IU/mL (ref 0–9)

## 2019-06-21 LAB — LIPID PANEL
Chol/HDL Ratio: 4.2 ratio (ref 0.0–4.4)
Cholesterol, Total: 201 mg/dL — ABNORMAL HIGH (ref 100–199)
HDL: 48 mg/dL (ref >39)
LDL Calculated: 126 mg/dL — ABNORMAL HIGH (ref 0–99)
Triglycerides: 133 mg/dL (ref 0–149)
VLDL Cholesterol Cal: 27 mg/dL (ref 5–40)

## 2019-06-23 ENCOUNTER — Telehealth: Payer: Self-pay | Admitting: Pharmacy Technician

## 2019-06-23 ENCOUNTER — Ambulatory Visit: Payer: Medicaid Other | Admitting: Pharmacist

## 2019-06-23 ENCOUNTER — Telehealth: Payer: Self-pay | Admitting: Family Medicine

## 2019-06-23 ENCOUNTER — Encounter: Payer: Medicaid Other | Admitting: Internal Medicine

## 2019-06-23 ENCOUNTER — Other Ambulatory Visit: Payer: Self-pay | Admitting: Family Medicine

## 2019-06-23 DIAGNOSIS — Z8269 Family history of other diseases of the musculoskeletal system and connective tissue: Secondary | ICD-10-CM

## 2019-06-23 DIAGNOSIS — R768 Other specified abnormal immunological findings in serum: Secondary | ICD-10-CM

## 2019-06-23 NOTE — Telephone Encounter (Signed)
Contacting pharmacy to check if PA has been started

## 2019-06-23 NOTE — Telephone Encounter (Signed)
[facility] Patient Advocate Encounter    Findings of the benefits investigation conducted this morning via test claims for the patient's upcoming appointment on 06/23/2019 are as follows:   Insurance: NCMED active status  Estimated copay amount: $3.00 Prior Authorization: [id number]  [facility] Patient Advocate will follow up once patient arrives for their appointment.  Bartholomew Crews, CPhT Specialty Pharmacy Patient [facility] Phone: [phone] Fax: [phone] 06/23/2019 8:41 AM

## 2019-06-23 NOTE — Progress Notes (Signed)
re

## 2019-06-24 NOTE — Telephone Encounter (Signed)
Prior Auth for Dexilant 60mg - APPROVED valid 06/24/19-06/18/2020  XB#[id number]  Spoke with Rachel Underwood at [facility] call reference # [id number]

## 2019-06-26 ENCOUNTER — Other Ambulatory Visit: Payer: Self-pay | Admitting: Family Medicine

## 2019-06-26 DIAGNOSIS — Z8269 Family history of other diseases of the musculoskeletal system and connective tissue: Secondary | ICD-10-CM

## 2019-06-26 DIAGNOSIS — R768 Other specified abnormal immunological findings in serum: Secondary | ICD-10-CM

## 2019-06-27 ENCOUNTER — Other Ambulatory Visit: Payer: Self-pay | Admitting: Family Medicine

## 2019-06-27 ENCOUNTER — Ambulatory Visit: Payer: Medicaid Other | Admitting: Pharmacist

## 2019-06-27 MED ORDER — LINACLOTIDE 290 MCG PO CAPS: 290.0000 ug | ORAL_CAPSULE | Freq: Every day | ORAL | 2 refills | Status: DC

## 2019-06-27 NOTE — Telephone Encounter (Signed)
What is the name of the medication? Linzess  Have you contacted your pharmacy to request a refill? Yes   Which pharmacy would you like this sent to? [facility]   Patient notified that their request is being sent to the clinical staff for review and that they should receive a call once it is complete. If they do not receive a call within 24 hours they can check with their pharmacy or our office.

## 2019-06-27 NOTE — Telephone Encounter (Signed)
Done

## 2019-06-30 ENCOUNTER — Ambulatory Visit (INDEPENDENT_AMBULATORY_CARE_PROVIDER_SITE_OTHER): Payer: Medicaid Other | Admitting: Pharmacist

## 2019-06-30 ENCOUNTER — Other Ambulatory Visit: Payer: Self-pay

## 2019-06-30 ENCOUNTER — Encounter (INDEPENDENT_AMBULATORY_CARE_PROVIDER_SITE_OTHER): Payer: Medicaid Other | Admitting: *Deleted

## 2019-06-30 ENCOUNTER — Encounter: Payer: Self-pay | Admitting: Internal Medicine

## 2019-06-30 ENCOUNTER — Ambulatory Visit (INDEPENDENT_AMBULATORY_CARE_PROVIDER_SITE_OTHER): Payer: Medicaid Other | Admitting: Internal Medicine

## 2019-06-30 VITALS — BP 114/86 | HR 81 | Temp 98.0°F | Ht 66.0 in | Wt 278.8 lb

## 2019-06-30 DIAGNOSIS — B20 Human immunodeficiency virus [HIV] disease: Secondary | ICD-10-CM

## 2019-06-30 DIAGNOSIS — Z006 Encounter for examination for normal comparison and control in clinical research program: Secondary | ICD-10-CM

## 2019-06-30 DIAGNOSIS — F319 Bipolar disorder, unspecified: Secondary | ICD-10-CM | POA: Diagnosis not present

## 2019-06-30 DIAGNOSIS — Z7689 Persons encountering health services in other specified circumstances: Secondary | ICD-10-CM | POA: Diagnosis not present

## 2019-06-30 DIAGNOSIS — R110 Nausea: Secondary | ICD-10-CM | POA: Diagnosis not present

## 2019-06-30 MED ORDER — ONDANSETRON HCL 8 MG PO TABS: 8.0000 mg | ORAL_TABLET | Freq: Three times a day (TID) | ORAL | 2 refills | Status: DC | PRN

## 2019-06-30 MED ORDER — ACYCLOVIR 400 MG PO TABS: 400.0000 mg | ORAL_TABLET | Freq: Two times a day (BID) | ORAL | 5 refills | Status: DC | PRN

## 2019-06-30 MED ORDER — BICTEGRAVIR-EMTRICITAB-TENOFOV 50-200-25 MG PO TABS: 1.0000 | ORAL_TABLET | Freq: Every day | ORAL | 5 refills | Status: DC

## 2019-06-30 MED ORDER — FUROSEMIDE 20 MG PO TABS: 20.0000 mg | ORAL_TABLET | Freq: Every day | ORAL | 2 refills | Status: DC

## 2019-06-30 MED FILL — BIKTARVY 50-200-25 MG TABS: 50-200-25 | 30 days supply | Qty: 30 | Fill #0

## 2019-06-30 MED FILL — ACYCLOVIR 400 MG TABLET: 400 | 30 days supply | Qty: 60 | Fill #0

## 2019-06-30 MED FILL — FUROSEMIDE 20 MG TABS: 20 | 30 days supply | Qty: 30 | Fill #0

## 2019-06-30 NOTE — Progress Notes (Signed)
RFV: follow up for hiv disease  Patient ID: Rachel Underwood, female   DOB: Rachel Underwood 22, 1972, 48 y.o.   MRN: [id number]  HPI [name] is a 48yo F with hiv disease, CD 4 count 331/VL<20 in June 2020, currently on biktarvy.she had relocated temporarily for her care but now is back to re-establishing with [facility]. She states that she is doing well with her medication- has had good virologic control. Some changes to her meds for bipolar disorder  ROS: nausea in the morning. 12 point ros is otherwise negative  Outpatient Encounter Medications as of 06/30/2019  Medication Sig  . acyclovir (ZOVIRAX) 400 MG tablet Take 1 tablet (400 mg total) by mouth 2 (two) times daily as needed (for prophylaxis).  Marland Kitchen albuterol (PROVENTIL HFA;VENTOLIN HFA) 108 (90 BASE) MCG/ACT inhaler Inhale 2 puffs into the lungs every 4 (four) hours as needed for wheezing or shortness of breath. Reported on 04/06/2016  . bictegravir-emtricitabine-tenofovir AF (BIKTARVY) 50-200-25 MG TABS tablet Take 1 tablet by mouth daily.  . cetirizine (ZYRTEC) 10 MG tablet Take 1 tablet (10 mg total) by mouth daily.  Marland Kitchen dexlansoprazole (DEXILANT) 60 MG capsule Take 1 capsule (60 mg total) by mouth daily.  Marland Kitchen EPIPEN 2-PAK 0.3 MG/0.3ML SOAJ injection Inject 0.3 mLs (0.3 mg total) into the skin once.  . furosemide (LASIX) 20 MG tablet Take 20 mg by mouth daily.  Marland Kitchen lamoTRIgine (LAMICTAL) 200 MG tablet Take 1 tablet (200 mg total) by mouth 2 (two) times daily.  Marland Kitchen linaclotide (LINZESS) 290 MCG CAPS capsule Take 1 capsule (290 mcg total) by mouth daily. On empty stomach  . nystatin (MYCOSTATIN/NYSTOP) powder Apply topically 3 (three) times daily. For up to 2 weeks for rash under breast  . ondansetron (ZOFRAN) 8 MG tablet Take 1 tablet (8 mg total) by mouth every 8 (eight) hours as needed for nausea or vomiting.  . propranolol (INDERAL) 10 MG tablet Take 10 mg by mouth 2 (two) times daily.  . sertraline (ZOLOFT) 100 MG tablet Take 250 mg by mouth every morning. Takes 2  daily  . topiramate (TOPAMAX) 50 MG tablet Take 1 tablet (50 mg total) by mouth 2 (two) times daily. (Patient not taking: Reported on 06/30/2019)   No facility-administered encounter medications on file as of 06/30/2019.      Patient Active Problem List   Diagnosis Date Noted  . Bilateral carpal tunnel syndrome 02/17/2016  . Allergic rhinitis due to pollen 02/17/2016  . Headache 11/09/2015  . Chronic back pain 11/09/2015  . Diarrhea 05/19/2015  . Cysts of both ovaries 05/12/2015  . Abdominal wall hernia 02/03/2015  . Abdominal wall mass of periumbilical region 52/84/1324  . Schatzki's ring   . Diverticulosis of colon without hemorrhage   . Other hemorrhoids   . Rectal bleeding 12/11/2014  . GERD (gastroesophageal reflux disease) 12/11/2014  . Esophageal dysphagia 12/11/2014  . Constipation 12/11/2014  . S/P hysterectomy 09/01/2014  . Pelvic pain in female 07/22/2014  . Anovulation 06/18/2014  . DUB (dysfunctional uterine bleeding) 06/18/2014  . Menorrhagia with irregular cycle 06/03/2014  . Enlarged uterus 06/03/2014  . HTN (hypertension) 01/15/2014  . Human immunodeficiency virus (HIV) disease ([place]) 07/17/2013  . Schizophrenia ([place]) 07/17/2013  . Bipolar disorder, unspecified ([place]) 07/17/2013     Health Maintenance Due  Topic Date Due  . PAP SMEAR-Modifier  09/22/2016     Social History   Tobacco Use  . Smoking status: Current Every Day Smoker    Packs/day: 0.50    Years: 30.00  Pack years: 15.00    Types: Cigarettes    Start date: 12/04/1982  . Smokeless tobacco: Never Used  Substance Use Topics  . Alcohol use: No    Alcohol/week: 0.0 standard drinks  . 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  family history includes ADD / ADHD in her son; Alzheimer's disease in her maternal grandfather; Anxiety disorder in her daughter; Arthritis in her father and mother;  Bipolar disorder in her daughter; Cancer in her maternal grandmother and paternal grandfather; Dementia in her paternal grandmother; Depression in her maternal grandmother and mother; Early death in her brother; Fibroids in her mother; Heart disease in her father, maternal grandfather, maternal grandmother, mother, and paternal grandfather; Hyperlipidemia in her maternal grandfather, maternal grandmother, paternal grandfather, and paternal grandmother; Hypertension in her brother, father, maternal grandfather, maternal grandmother, mother, paternal grandfather, and paternal grandmother; Irritable bowel syndrome in her mother; Learning disabilities in her brother; Mental illness in her brother, father, and mother; Schizophrenia in her daughter; Stroke in her mother; Vision loss in her father and mother. Physical Exam   BP 114/86   Pulse 81   Temp 98 F (36.7 C) (Oral)   Ht 5\' 6"  (1.676 m)   Wt 278 lb 12.8 oz (126.5 kg)   LMP 08/11/2014   BMI 45.00 kg/m   Physical Exam  Constitutional:  oriented to person, place, and time. appears well-developed and well-nourished. No distress.  HENT: [place]/AT, PERRLA, no scleral icterus Mouth/Throat: Oropharynx is clear and moist. No oropharyngeal exudate.  Cardiovascular: Normal rate, regular rhythm and normal heart sounds. Exam reveals no gallop and no friction rub.  No murmur heard.  Pulmonary/Chest: Effort normal and breath sounds normal. No respiratory distress.  has no wheezes.  Neck = supple, no nuchal rigidity Abdominal: Soft. Bowel sounds are normal.  exhibits no distension. There is no tenderness.  Lymphadenopathy: no cervical adenopathy. No axillary adenopathy Neurological: alert and oriented to person, place, and time.  Skin: Skin is warm and dry. No rash noted. No erythema.  Psychiatric: a normal mood and affect.  behavior is normal.   Lab Results  Component Value Date   CD4TCELL 25 (L) 05/26/2019   Lab Results  Component Value Date   CD4TABS  331 (L) 05/26/2019   CD4TABS 710 05/10/2016   CD4TABS 770 11/10/2015   Lab Results  Component Value Date   HIV1RNAQUANT <20 NOT DETECTED 05/26/2019   Lab Results  Component Value Date   HEPBSAB POS (A) 07/15/2013   Lab Results  Component Value Date   LABRPR NON-REACTIVE 05/26/2019    CBC Lab Results  Component Value Date   WBC 8.4 05/26/2019   RBC 4.01 05/26/2019   HGB 13.3 05/26/2019   HCT 39.2 05/26/2019   PLT 210 05/26/2019   MCV 97.8 05/26/2019   MCH 33.2 (H) 05/26/2019   MCHC 33.9 05/26/2019   RDW 12.3 05/26/2019   LYMPHSABS 1,386 05/26/2019   MONOABS 528 05/10/2016   EOSABS 17 05/26/2019    BMET Lab Results  Component Value Date   NA 141 05/26/2019   K 4.7 05/26/2019   CL 103 05/26/2019   CO2 29 05/26/2019   GLUCOSE 105 (H) 05/26/2019   BUN 17 05/26/2019   CREATININE 0.86 05/26/2019   CALCIUM 9.8 05/26/2019   GFRNONAA 81 05/26/2019   GFRAA 94 05/26/2019      Assessment  and Plan  hiv disease= well controlled. Will continue on biktarvy  Nausea = will refill zofran  Bipolar disorder= continue with current regimen. Would recommend to continue in care +/- substance abuse counseling  Health maintenance= will need to get pap, mammo, and flu vaccine at next visit

## 2019-06-30 NOTE — Research (Signed)
Rachel Underwood transferred back to our site to stay with her Father in [place]. She denies any new problems, except for possibility of Lupus. She has not seen a rheumatologist yet about it, but does say she has the lab marker for it and both her daughter and mother have been diagnosed with it. She is seeing Dr. Baxter Flattery today to resume care here in the clinic.

## 2019-06-30 NOTE — Progress Notes (Signed)
HPI: Rachel Underwood is a 48 y.o. female who presents to the [facility] clinic to transfer her HIV care to Dr. Baxter Flattery.  Patient Active Problem List   Diagnosis Date Noted  . Bilateral carpal tunnel syndrome 02/17/2016  . Allergic rhinitis due to pollen 02/17/2016  . Headache 11/09/2015  . Chronic back pain 11/09/2015  . Diarrhea 05/19/2015  . Cysts of both ovaries 05/12/2015  . Abdominal wall hernia 02/03/2015  . Abdominal wall mass of periumbilical region 74/94/4967  . Schatzki's ring   . Diverticulosis of colon without hemorrhage   . Other hemorrhoids   . Rectal bleeding 12/11/2014  . GERD (gastroesophageal reflux disease) 12/11/2014  . Esophageal dysphagia 12/11/2014  . Constipation 12/11/2014  . S/P hysterectomy 09/01/2014  . Pelvic pain in female 07/22/2014  . Anovulation 06/18/2014  . DUB (dysfunctional uterine bleeding) 06/18/2014  . Menorrhagia with irregular cycle 06/03/2014  . Enlarged uterus 06/03/2014  . HTN (hypertension) 01/15/2014  . Human immunodeficiency virus (HIV) disease ([place]) 07/17/2013  . Schizophrenia ([place]) 07/17/2013  . Bipolar disorder, unspecified ([place]) 07/17/2013    Patient's Medications  New Prescriptions   No medications on file  Previous Medications   ALBUTEROL (PROVENTIL HFA;VENTOLIN HFA) 108 (90 BASE) MCG/ACT INHALER    Inhale 2 puffs into the lungs every 4 (four) hours as needed for wheezing or shortness of breath. Reported on 04/06/2016   CETIRIZINE (ZYRTEC) 10 MG TABLET    Take 1 tablet (10 mg total) by mouth daily.   DEXLANSOPRAZOLE (DEXILANT) 60 MG CAPSULE    Take 1 capsule (60 mg total) by mouth daily.   EPIPEN 2-PAK 0.3 MG/0.3ML SOAJ INJECTION    Inject 0.3 mLs (0.3 mg total) into the skin once.   LAMOTRIGINE (LAMICTAL) 200 MG TABLET    Take 1 tablet (200 mg total) by mouth 2 (two) times daily.   LINACLOTIDE (LINZESS) 290 MCG CAPS CAPSULE    Take 1 capsule (290 mcg total) by mouth daily. On empty stomach   NYSTATIN (MYCOSTATIN/NYSTOP) POWDER     Apply topically 3 (three) times daily. For up to 2 weeks for rash under breast   ONDANSETRON (ZOFRAN) 8 MG TABLET    Take 1 tablet (8 mg total) by mouth every 8 (eight) hours as needed for nausea or vomiting.   PROPRANOLOL (INDERAL) 10 MG TABLET    Take 10 mg by mouth 2 (two) times daily.   SERTRALINE (ZOLOFT) 100 MG TABLET    Take 250 mg by mouth every morning. Takes 2 daily   TOPIRAMATE (TOPAMAX) 50 MG TABLET    Take 1 tablet (50 mg total) by mouth 2 (two) times daily.  Modified Medications   Modified Medication Previous Medication   ACYCLOVIR (ZOVIRAX) 400 MG TABLET acyclovir (ZOVIRAX) 400 MG tablet      Take 1 tablet (400 mg total) by mouth 2 (two) times daily as needed (for prophylaxis).    Take 1 tablet (400 mg total) by mouth 2 (two) times daily as needed (for prophylaxis).   BICTEGRAVIR-EMTRICITABINE-TENOFOVIR AF (BIKTARVY) 50-200-25 MG TABS TABLET bictegravir-emtricitabine-tenofovir AF (BIKTARVY) 50-200-25 MG TABS tablet      Take 1 tablet by mouth daily.    Take 1 tablet by mouth daily.   FUROSEMIDE (LASIX) 20 MG TABLET furosemide (LASIX) 20 MG tablet      Take 1 tablet (20 mg total) by mouth daily.    Take 20 mg by mouth daily.  Discontinued Medications   No medications on file    Allergies: Allergies  Allergen Reactions  . Flecainide Swelling  . Naproxen Anaphylaxis and Hives  . Piroxicam Anaphylaxis  . Abilify [Aripiprazole]   . Citalopram Hives  . Codeine Nausea And Vomiting  . Depakote [Divalproex Sodium] Swelling  . Gabapentin     Hives   . Haldol [Haloperidol Lactate] Other (See Comments)    Slurs speech and "stumble"  . Indomethacin Swelling    Dizzy, tigling of face  . Invega [Paliperidone] Other (See Comments)    Slurred speech, drooling  . Latuda [Lurasidone Hcl] Itching  . Lithium Hives  . Promethazine Swelling    Arms and hands  . Trazodone And Nefazodone Other (See Comments)    Dry mouth, congestion, severe dryness.  [name] [Escitalopram Oxalate]  Rash    Past Medical History: Past Medical History:  Diagnosis Date  . Abnormal Pap smear of cervix 2005(EST)   [facility]nl pap- [facility] [place]  . Allergy   . Asthma   . Bipolar 1 disorder ([place])   . Borderline personality disorder ([place])   . BPD (bronchopulmonary dysplasia)   . Carpal tunnel syndrome    bilateral wrist  . Depression   . Dysmenorrhea 06/03/2014  . Enlarged uterus 06/03/2014  . Fibromyalgia   . GERD (gastroesophageal reflux disease)   . Herpes simplex without mention of complication   . HIV (human immunodeficiency virus infection) ([place])   . Hyperlipidemia   . Hypertension   . Menorrhagia with irregular cycle 06/03/2014  . Osteoarthritis   . Seizures ([place])    had as a teenager, unknown etiology and no meds.    Social History: Social History   Socioeconomic History  . Marital status: Divorced    Spouse name: Not on file  . Number of children: 3  . Years of education: Not on file  . Highest education level: Not on file  Occupational History  . Not on file  Social Needs  . Financial resource strain: Not on file  . Food insecurity    Worry: Not on file    Inability: Not on file  . Transportation needs    Medical: Not on file    Non-medical: Not on file  Tobacco Use  . Smoking status: Current Every Day Smoker    Packs/day: 0.50    Years: 30.00    Pack years: 15.00    Types: Cigarettes    Start date: 12/04/1982  . Smokeless tobacco: Never Used  Substance and Sexual Activity  . Alcohol use: No    Alcohol/week: 0.0 standard drinks  . 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 Sexual activity: Yes    Partners: Male    Birth control/protection: Condom  Lifestyle  . Physical activity    Days per week: Not on file    Minutes per session: Not on file  . Stress: Not on file  Relationships  . Social Herbalist on phone: Not on file     Gets together: Not on file    Attends religious service: Not on file    Active member of club or organization: Not on file    Attends meetings of clubs or organizations: Not on file    Relationship status: Not on file  Other Topics Concern  . Not on file  Social History Narrative  . Not on file  Labs: Lab Results  Component Value Date   HIV1RNAQUANT <20 NOT DETECTED 05/26/2019   HIV1RNAQUANT <20 05/10/2016   HIV1RNAQUANT <20 11/10/2015   CD4TABS 331 (L) 05/26/2019   CD4TABS 710 05/10/2016   CD4TABS 770 11/10/2015    RPR and STI Lab Results  Component Value Date   LABRPR NON-REACTIVE 05/26/2019   LABRPR NON REAC 09/29/2014   LABRPR NON REAC 08/26/2013   LABRPR NON REAC 07/15/2013    STI Results GC CT  05/26/2019 Negative Negative    Hepatitis B Lab Results  Component Value Date   HEPBSAB POS (A) 07/15/2013   HEPBSAG NEGATIVE 12/24/2014   HEPBCAB NON REACTIVE 12/24/2014   Hepatitis C No results found for: HEPCAB, HCVRNAPCRQN Hepatitis A Lab Results  Component Value Date   HAV POS (A) 07/15/2013   Lipids: Lab Results  Component Value Date   CHOL 201 (H) 06/20/2019   TRIG 133 06/20/2019   HDL 48 06/20/2019   CHOLHDL 4.2 06/20/2019   VLDL 34 02/09/2015   LDLCALC 126 (H) 06/20/2019    Current HIV Regimen: Biktarvy  Assessment: Rachel Underwood is here today to establish care with Dr. Baxter Flattery for her HIV infection.  She is currently taking Biktarvy and has an undetectable HIV viral load. I met with her today and introduced myself and our specialty pharmacy services.  She is having an issue with paying the $3 copay for her medications.  She has [place] [employer]. I sent her Biktarvy to [facility] and they will waive her copays and mail her the medication.  They will also transfer all of her medication from [facility] and start filling them for her.   Plan: - Continue Biktarvy Po once daily - Start filling medications at  [facility]. , PharmD, BCIDP, AAHIVP, [facility] 07/01/2019, 11:34 AM

## 2019-07-01 ENCOUNTER — Encounter: Payer: Self-pay | Admitting: Family Medicine

## 2019-07-02 LAB — PAP IG, CT-NG NAA, HPV HIGH-RISK
Chlamydia, Nuc. Acid Amp: NEGATIVE
Gonococcus by Nucleic Acid Amp: NEGATIVE
HPV, high-risk: NEGATIVE

## 2019-07-02 NOTE — Congregational Nurse Program (Unsigned)
  Dept: [phone]   Congregational Nurse Program Note  Date of Encounter: 07/02/2019  Past Medical History: Past Medical History:  Diagnosis Date  . Abnormal Pap smear of cervix 2005(EST)   [facility] abnl pap- [facility] [place]  . Allergy   . Asthma   . Bipolar 1 disorder ([place])   . Borderline personality disorder ([place])   . BPD (bronchopulmonary dysplasia)   . Carpal tunnel syndrome    bilateral wrist  . Depression   . Dysmenorrhea 06/03/2014  . Enlarged uterus 06/03/2014  . Fibromyalgia   . GERD (gastroesophageal reflux disease)   . Herpes simplex without mention of complication   . HIV (human immunodeficiency virus infection) ([place])   . Hyperlipidemia   . Hypertension   . Menorrhagia with irregular cycle 06/03/2014  . Osteoarthritis   . Seizures ([place])    had as a teenager, unknown etiology and no meds.    Encounter Details: CNP Questionnaire - 07/02/19 1135      Questionnaire   Race  White or Caucasian    Location Patient Served At  [employer], [employer]    Uninsured  Not Applicable    Food  Yes, have food insecurities    Housing/Utilities  Yes, have permanent housing    Transportation  No transportation needs    Interpersonal Safety  Yes, feel physically and emotionally safe where you currently live    Medication  No medication insecurities    Medical Provider  Yes    Referrals  Not Applicable    ED Visit Averted  Not Applicable    Life-Saving Intervention Made  Not Applicable     7/34/0370 11:35  Check B/P .  B/P 118/75 Pulse 90 Been chasing after Grandchildren.  Alinda Money [phone]

## 2019-07-08 ENCOUNTER — Other Ambulatory Visit (HOSPITAL_COMMUNITY): Payer: Self-pay | Admitting: Family Medicine

## 2019-07-08 DIAGNOSIS — Z1231 Encounter for screening mammogram for malignant neoplasm of breast: Secondary | ICD-10-CM

## 2019-07-10 ENCOUNTER — Encounter (HOSPITAL_COMMUNITY): Payer: Medicaid Other

## 2019-07-28 ENCOUNTER — Telehealth: Payer: Self-pay | Admitting: *Deleted

## 2019-07-28 MED FILL — lamoTRIgine 200 MG TABS: 200 | 30 days supply | Qty: 60 | Fill #0

## 2019-07-28 MED FILL — FUROSEMIDE 20 MG TABS: 20 | 30 days supply | Qty: 30 | Fill #1

## 2019-07-28 MED FILL — ACYCLOVIR 400 MG TABLET: 400 | 30 days supply | Qty: 60 | Fill #1

## 2019-07-28 MED FILL — BIKTARVY 50-200-25 MG TABS: 50-200-25 | 30 days supply | Qty: 30 | Fill #1

## 2019-07-28 MED FILL — DEXILANT DR 60 MG CAPSULE: 60 | 30 days supply | Qty: 30 | Fill #0

## 2019-07-28 NOTE — Telephone Encounter (Signed)
Patient called with request to change zofran pills -> dissolvable formulation. Cassie Kuppelweiser will change this  She is also following up with PCP/pharmacy regarding inhaler, allergy pills, and nystatin refills. Landis Gandy, RN

## 2019-07-30 ENCOUNTER — Telehealth: Payer: Self-pay | Admitting: *Deleted

## 2019-07-30 DIAGNOSIS — L304 Erythema intertrigo: Secondary | ICD-10-CM

## 2019-07-30 DIAGNOSIS — K2100 Gastro-esophageal reflux disease with esophagitis, without bleeding: Secondary | ICD-10-CM

## 2019-07-30 MED ORDER — DEXILANT 60 MG PO CPDR: 1.0000 | DELAYED_RELEASE_CAPSULE | Freq: Every day | ORAL | 2 refills | Status: DC

## 2019-07-30 MED ORDER — NYSTATIN 100000 UNIT/GM EX POWD: Freq: Three times a day (TID) | CUTANEOUS | 1 refills | Status: DC

## 2019-07-30 NOTE — Telephone Encounter (Signed)
rtc to pt, she now has [facility] insurance needs to change Dexilant & Nystatin Rxs to [place]

## 2019-07-31 MED FILL — NYSTATIN 100000 UNIT/GM POW: 100000 | 14 days supply | Qty: 30 | Fill #0

## 2019-08-06 ENCOUNTER — Ambulatory Visit: Payer: Medicaid Other | Admitting: Nurse Practitioner

## 2019-08-06 ENCOUNTER — Telehealth: Payer: Self-pay | Admitting: Internal Medicine

## 2019-08-06 ENCOUNTER — Encounter: Payer: Self-pay | Admitting: Internal Medicine

## 2019-08-06 NOTE — Telephone Encounter (Signed)
Patient was a no show and letter sent  °

## 2019-08-06 NOTE — Progress Notes (Deleted)
Referring Provider: No ref. provider found Primary Care Physician:  Janora Norlander, DO Primary GI:  Dr. Gala Romney  No chief complaint on file.   HPI:   Rachel Underwood is a 48 y.o. female who presents for follow-up on GERD and constipation.  The patient was last seen in our office 11/18/2015 for the same.  Noted history of GERD nausea.  Recent diarrhea prior to her last visit which was self-limiting and in the setting of baseline constipation.  Switch from pantoprazole to Dexilant due to poorly controlled reflux with recent EGD prior to her last visit showing erosive reflux esophagitis.  At her last visit Dexilant controls her symptoms much better, no dysphagia.  Bowel movements still difficult, frequent gas, [place] 2-3 stools.  Vague abdominal pain and bloating.  Failed probiotics.  On Amitiza 24 mcg twice daily.  Recommended stop Amitiza and start Linzess 290 mcg daily.  Follow-up in 6 months.  Patient followed up with our office in March 2017 indicating request for Dexilant twice daily.  Recommended discussed with infectious disease about adding Dexilant.  2 months later the patient called back asking again for Dexilant twice daily and was amendable to [place] in the evenings.  Patient was a no-show to her most recent visit dated 06/26/2016.  It appears the patient was seen by [facility] gastroenterology on 11/09/2017 for GERD, dysphagia, constipation.  Noted history of HIV on antiretroviral therapy.  Currently on Dexilant daily and Zantac 150 milligrams nightly.  Prior history of Schatzki's ring needing dilation.  Feels GERD symptoms have worsened, especially at nighttime.  States she follows lifestyle changes including trigger avoidance and avoiding foods close to bedtime.  Reviewed previous EGD dated 2016.  Recommended barium swallow, videofluoroscopy, and EGD with possible dilation.  Continue PPI and H2 receptor blocker as well as lifestyle modifications.  Continue and complete current  prescription of Linzess and consider cutting back on the dose versus Movantik for chronic constipation possibly narcotic related.  Referral to bariatric surgery.  Barium swallow completed 12/18/2017 which found no evidence of esophageal stricture or mass, normal motility, small hiatal hernia, moderate to severe GERD.  It appears the patient was also seen by general surgery on 01/08/2018 for consideration of bariatric surgery.  Best sleeve gastrectomy and gastric bypass with general feeling of gastric bypass more appropriate due to severe GERD symptoms.  Ordered gastric emptying study to assess for gastroparesis.  Begin work-up for preop.  It does not appear the patient follow through with weight loss surgery.  Of note the patient was seen 06/20/2019 for a well woman exam with her primary care.  Notes GERD with history of Schatzki's ring on Dexilant which "typically controls her reflux symptoms well."  Indicated upcoming GI follow-up but out of medication.  It appears her primary care refill her Dexilant for her.  Today she states   Past Medical History:  Diagnosis Date  . Abnormal Pap smear of cervix 2005(EST)   [facility]nl pap[facility] [place]  . Allergy   . Asthma   . Bipolar 1 disorder ([place])   . Borderline personality disorder ([place])   . BPD (bronchopulmonary dysplasia)   . Carpal tunnel syndrome    bilateral wrist  . Depression   . Dysmenorrhea 06/03/2014  . Enlarged uterus 06/03/2014  . Fibromyalgia   . GERD (gastroesophageal reflux disease)   . Herpes simplex without mention of complication   . HIV (human immunodeficiency virus infection) ([place])   . Hyperlipidemia   .  Hypertension   . Menorrhagia with irregular cycle 06/03/2014  . Osteoarthritis   . Seizures ([place])    had as a teenager, unknown etiology and no meds.    Past Surgical History:  Procedure Laterality Date  . ABDOMINAL HYSTERECTOMY N/A 09/01/2014   Procedure: HYSTERECTOMY ABDOMINAL;  Surgeon: Jonnie Kind, MD;  Location: [facility];  Service: Gynecology;  Laterality: N/A;  . ANKLE SURGERY Right    repair of ligaments  . BACK SURGERY    . BILATERAL SALPINGECTOMY Bilateral 09/01/2014   Procedure: BILATERAL SALPINGECTOMY;  Surgeon: Jonnie Kind, MD;  Location: [facility];  Service: Gynecology;  Laterality: Bilateral;  . CESAREAN SECTION    . ckc    . COLONOSCOPY N/A 12/23/2014   [id number] canal hemorrhoids left-sided diverticula  . ESOPHAGOGASTRODUODENOSCOPY N/A 12/23/2014   [id number] reflux/noncritical shatzkis ring s/p dilation/small HH  . EXCISION OF SKIN TAG N/A 09/01/2014   Procedure: EXCISION OF SKIN NEVUS;  Surgeon: Jonnie Kind, MD;  Location: [facility];  Service: Gynecology;  Laterality: N/A;  . FRACTURE SURGERY    . MALONEY DILATION N/A 12/23/2014   Procedure: Venia Minks DILATION;  Surgeon: Daneil Dolin, MD;  Location: [facility];  Service: Endoscopy;  Laterality: N/A;  . SAVORY DILATION N/A 12/23/2014   Procedure: SAVORY DILATION;  Surgeon: Daneil Dolin, MD;  Location: [facility];  Service: Endoscopy;  Laterality: N/A;  . SCAR REVISION N/A 09/01/2014   Procedure: EXCISION OF CICATRIX;  Surgeon: Jonnie Kind, MD;  Location: [facility];  Service: Gynecology;  Laterality: N/A;  . SHOULDER SURGERY Right    rotator cuff  . SPINE SURGERY      Current Outpatient Medications  Medication Sig Dispense Refill  . acyclovir (ZOVIRAX) 400 MG tablet Take 1 tablet (400 mg total) by mouth 2 (two) times daily as needed (for prophylaxis). 60 tablet 5  . albuterol (PROVENTIL HFA;VENTOLIN HFA) 108 (90 BASE) MCG/ACT inhaler Inhale 2 puffs into the lungs every 4 (four) hours as needed for wheezing or shortness of breath. Reported on 04/06/2016    . bictegravir-emtricitabine-tenofovir AF (BIKTARVY) 50-200-25 MG TABS tablet Take 1 tablet by mouth daily. 30 tablet 5  . cetirizine (ZYRTEC) 10 MG tablet Take 1 tablet (10 mg total) by mouth daily. 30 tablet 5  . dexlansoprazole (DEXILANT) 60 MG capsule Take 1  capsule (60 mg total) by mouth daily. 30 capsule 2  . EPIPEN 2-PAK 0.3 MG/0.3ML SOAJ injection Inject 0.3 mLs (0.3 mg total) into the skin once. 1 Device 0  . furosemide (LASIX) 20 MG tablet Take 1 tablet (20 mg total) by mouth daily. 30 tablet 2  . lamoTRIgine (LAMICTAL) 200 MG tablet Take 1 tablet (200 mg total) by mouth 2 (two) times daily. 60 tablet 3  . linaclotide (LINZESS) 290 MCG CAPS capsule Take 1 capsule (290 mcg total) by mouth daily. On empty stomach 30 capsule 2  . nystatin (MYCOSTATIN/NYSTOP) powder Apply topically 3 (three) times daily. For up to 2 weeks for rash under breast 30 g 1  . ondansetron (ZOFRAN) 8 MG tablet Take 1 tablet (8 mg total) by mouth every 8 (eight) hours as needed for nausea or vomiting. 60 tablet 2  . propranolol (INDERAL) 10 MG tablet Take 10 mg by mouth 2 (two) times daily.    . sertraline (ZOLOFT) 100 MG tablet Take 250 mg by mouth every morning. Takes 2 daily    . topiramate (TOPAMAX) 50 MG tablet Take 1 tablet (50 mg total) by  mouth 2 (two) times daily. (Patient not taking: Reported on 06/30/2019) 60 tablet 3   No current facility-administered medications for this visit.     Allergies as of 08/06/2019 - Review Complete 06/30/2019  Allergen Reaction Noted  . Flecainide Swelling 03/08/2016  . Naproxen Anaphylaxis and Hives 06/03/2014  . Piroxicam Anaphylaxis 05/12/2015  . Abilify [aripiprazole]  05/12/2015  . Citalopram Hives 05/12/2015  . Codeine Nausea And Vomiting 03/25/2014  . Depakote [divalproex sodium] Swelling 05/12/2015  . Gabapentin  05/13/2014  . Haldol [haloperidol lactate] Other (See Comments) 03/25/2014  . Indomethacin Swelling 12/11/2014  . Invega [paliperidone] Other (See Comments) 06/10/2014  . Latuda [lurasidone hcl] Itching 05/12/2015  . Lithium Hives 06/03/2014  . Promethazine Swelling 02/03/2015  . Trazodone and nefazodone Other (See Comments) 12/18/2014  . Lexapro [escitalopram oxalate] Rash 07/15/2013    Family History   Problem Relation Age of Onset  . Fibroids Mother   . Hypertension Mother   . Depression Mother   . Irritable bowel syndrome Mother   . Arthritis Mother   . Heart disease Mother   . Mental illness Mother   . Stroke Mother   . Vision loss Mother   . Hypertension Father   . Mental illness Father   . Arthritis Father   . Heart disease Father   . Vision loss Father   . Heart disease Maternal Grandmother   . Cancer Maternal Grandmother        breast  . Depression Maternal Grandmother   . Hyperlipidemia Maternal Grandmother   . Hypertension Maternal Grandmother   . Bipolar disorder Daughter   . Schizophrenia Daughter   . Cancer Paternal Grandfather        prostate  . Heart disease Paternal Grandfather   . Hyperlipidemia Paternal Grandfather   . Hypertension Paternal Grandfather   . Dementia Paternal Grandmother   . Hyperlipidemia Paternal Grandmother   . Hypertension Paternal Grandmother   . Heart disease Maternal Grandfather   . Alzheimer's disease Maternal Grandfather   . Hyperlipidemia Maternal Grandfather   . Hypertension Maternal Grandfather   . Anxiety disorder Daughter   . ADD / ADHD Son   . Hypertension Brother   . Early death Brother   . Learning disabilities Brother   . Mental illness Brother   . Colon cancer Neg Hx     Social History   Socioeconomic History  . Marital status: Divorced    Spouse name: Not on file  . Number of children: 3  . Years of education: Not on file  . Highest education level: Not on file  Occupational History  . Not on file  Social Needs  . Financial resource strain: Not on file  . Food insecurity    Worry: Not on file    Inability: Not on file  . Transportation needs    Medical: Not on file    Non-medical: Not on file  Tobacco Use  . Smoking status: Current Every Day Smoker    Packs/day: 0.50    Years: 30.00    Pack years: 15.00    Types: Cigarettes    Start date: 12/04/1982  . Smokeless tobacco: Never Used  Substance  and Sexual Activity  . Alcohol use: No    Alcohol/week: 0.0 standard drinks  . 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 Sexual activity: Yes  Partners: Male    Birth control/protection: Condom  Lifestyle  . Physical activity    Days per week: Not on file    Minutes per session: Not on file  . Stress: Not on file  Relationships  . Social Herbalist on phone: Not on file    Gets together: Not on file    Attends religious service: Not on file    Active member of club or organization: Not on file    Attends meetings of clubs or organizations: Not on file    Relationship status: Not on file  Other Topics Concern  . Not on file  Social History Narrative  . Not on file    Review of Systems: General: Negative for anorexia, weight loss, fever, chills, fatigue, weakness. Eyes: Negative for vision changes.  ENT: Negative for hoarseness, difficulty swallowing , nasal congestion. CV: Negative for chest pain, angina, palpitations, dyspnea on exertion, peripheral edema.  Respiratory: Negative for dyspnea at rest, dyspnea on exertion, cough, sputum, wheezing.  GI: See history of present illness. GU:  Negative for dysuria, hematuria, urinary incontinence, urinary frequency, nocturnal urination.  MS: Negative for joint pain, low back pain.  Derm: Negative for rash or itching.  Neuro: Negative for weakness, abnormal sensation, seizure, frequent headaches, memory loss, confusion.  Psych: Negative for anxiety, depression, suicidal ideation, hallucinations.  Endo: Negative for unusual weight change.  Heme: Negative for bruising or bleeding. Allergy: Negative for rash or hives.   Physical Exam: LMP 08/11/2014  General:   Alert and oriented. Pleasant and cooperative. Well-nourished and well-developed.  Head:  Normocephalic and atraumatic. Eyes:  Without icterus, sclera clear and  conjunctiva pink.  Ears:  Normal auditory acuity. Mouth:  No deformity or lesions, oral mucosa pink.  Throat/Neck:  Supple, without mass or thyromegaly. Cardiovascular:  S1, S2 present without murmurs appreciated. Normal pulses noted. Extremities without clubbing or edema. Respiratory:  Clear to auscultation bilaterally. No wheezes, rales, or rhonchi. No distress.  Gastrointestinal:  +BS, soft, non-tender and non-distended. No HSM noted. No guarding or rebound. No masses appreciated.  Rectal:  Deferred  Musculoskalatal:  Symmetrical without gross deformities. Normal posture. Skin:  Intact without significant lesions or rashes. Neurologic:  Alert and oriented x4;  grossly normal neurologically. Psych:  Alert and cooperative. Normal mood and affect. Heme/Lymph/Immune: No significant cervical adenopathy. No excessive bruising noted.    08/06/2019 12:25 PM   Disclaimer: This note was dictated with voice recognition software. Similar sounding words can inadvertently be transcribed and may not be corrected upon review.

## 2019-08-12 ENCOUNTER — Ambulatory Visit: Payer: Medicaid Other | Admitting: Cardiovascular Disease

## 2019-08-14 ENCOUNTER — Ambulatory Visit (HOSPITAL_COMMUNITY): Payer: Medicaid Other

## 2019-08-15 MED FILL — RESTASIS 0.05% EYE EMULSION: 0.05 | 30 days supply | Qty: 60 | Fill #0

## 2019-08-15 MED FILL — PAZEO 0.7% EYE DROPS: 0.7 | 30 days supply | Qty: 3 | Fill #0

## 2019-08-21 MED FILL — LINZESS 290 MCG CAPSULE: 290 | 30 days supply | Qty: 30 | Fill #0

## 2019-08-27 MED FILL — SUBVENITE 200 MG TABS: 200 | 30 days supply | Qty: 60 | Fill #1

## 2019-08-27 MED FILL — DEXILANT DR 60 MG CAPSULE: 60 | 30 days supply | Qty: 30 | Fill #1

## 2019-08-27 MED FILL — BIKTARVY 50-200-25 MG TABS: 50-200-25 | 30 days supply | Qty: 30 | Fill #2

## 2019-09-10 DIAGNOSIS — M25562 Pain in left knee: Secondary | ICD-10-CM | POA: Diagnosis not present

## 2019-09-10 DIAGNOSIS — G8929 Other chronic pain: Secondary | ICD-10-CM | POA: Diagnosis not present

## 2019-09-10 DIAGNOSIS — S83242A Other tear of medial meniscus, current injury, left knee, initial encounter: Secondary | ICD-10-CM | POA: Diagnosis not present

## 2019-09-15 ENCOUNTER — Ambulatory Visit: Payer: Medicaid Other | Admitting: Cardiovascular Disease

## 2019-09-16 MED FILL — LINZESS 290 MCG CAPSULE: 290 | 30 days supply | Qty: 30 | Fill #1

## 2019-09-16 MED FILL — NYSTATIN 100000 UNIT/GM POW: 100000 | 14 days supply | Qty: 30 | Fill #1

## 2019-09-16 MED FILL — FUROSEMIDE 20 MG TABS: 20 | 30 days supply | Qty: 30 | Fill #2

## 2019-09-16 MED FILL — ONDANSETRON HCL 8 MG TABLET: 8 | 20 days supply | Qty: 60 | Fill #0

## 2019-09-16 MED FILL — PAZEO 0.7% EYE DROPS: 0.7 | 30 days supply | Qty: 3 | Fill #1

## 2019-09-16 MED FILL — ACYCLOVIR 400 MG TABLET: 400 | 30 days supply | Qty: 60 | Fill #2

## 2019-09-16 MED FILL — RESTASIS 0.05% EYE EMULSION: 0.05 | 30 days supply | Qty: 60 | Fill #1

## 2019-09-19 DIAGNOSIS — Z113 Encounter for screening for infections with a predominantly sexual mode of transmission: Secondary | ICD-10-CM | POA: Diagnosis not present

## 2019-09-19 DIAGNOSIS — N760 Acute vaginitis: Secondary | ICD-10-CM | POA: Diagnosis not present

## 2019-09-19 DIAGNOSIS — Z202 Contact with and (suspected) exposure to infections with a predominantly sexual mode of transmission: Secondary | ICD-10-CM | POA: Diagnosis not present

## 2019-09-22 ENCOUNTER — Ambulatory Visit: Payer: Medicaid Other | Admitting: Nurse Practitioner

## 2019-09-30 MED FILL — SUBVENITE 200 MG TABS: 200 | 30 days supply | Qty: 60 | Fill #2

## 2019-09-30 MED FILL — DEXILANT DR 60 MG CAPSULE: 60 | 30 days supply | Qty: 30 | Fill #0

## 2019-09-30 MED FILL — BIKTARVY 50-200-25 MG TABS: 50-200-25 | 30 days supply | Qty: 30 | Fill #3

## 2019-10-01 MED FILL — ONDANSETRON HCL 8 MG TABLET: 8 | 20 days supply | Qty: 60 | Fill #1

## 2019-10-06 ENCOUNTER — Ambulatory Visit: Payer: Medicaid Other | Admitting: Internal Medicine

## 2019-10-20 ENCOUNTER — Other Ambulatory Visit: Payer: Self-pay | Admitting: Pharmacist

## 2019-10-20 DIAGNOSIS — B20 Human immunodeficiency virus [HIV] disease: Secondary | ICD-10-CM

## 2019-10-24 ENCOUNTER — Encounter (INDEPENDENT_AMBULATORY_CARE_PROVIDER_SITE_OTHER): Payer: Medicaid Other | Admitting: *Deleted

## 2019-10-24 ENCOUNTER — Other Ambulatory Visit: Payer: Self-pay

## 2019-10-24 DIAGNOSIS — Z006 Encounter for examination for normal comparison and control in clinical research program: Secondary | ICD-10-CM

## 2019-10-24 DIAGNOSIS — Z23 Encounter for immunization: Secondary | ICD-10-CM | POA: Diagnosis not present

## 2019-10-24 MED FILL — LINZESS 290 MCG CAPSULE: 290 | 30 days supply | Qty: 30 | Fill #2

## 2019-10-24 MED FILL — ONDANSETRON HCL 8 MG TABLET: 8 | 20 days supply | Qty: 60 | Fill #2

## 2019-10-24 MED FILL — RESTASIS 0.05% EYE EMULSION: 0.05 | 30 days supply | Qty: 60 | Fill #2

## 2019-10-24 MED FILL — ACYCLOVIR 400 MG TABLET: 400 | 30 days supply | Qty: 60 | Fill #3

## 2019-10-24 MED FILL — DEXILANT DR 60 MG CAPSULE: 60 | 30 days supply | Qty: 30 | Fill #1

## 2019-10-24 MED FILL — SUBVENITE 200 MG TABS: 200 | 30 days supply | Qty: 60 | Fill #3

## 2019-10-24 MED FILL — PAZEO 0.7% EYE DROPS: 0.7 | 30 days supply | Qty: 3 | Fill #2

## 2019-10-24 MED FILL — FUROSEMIDE 20 MG TABS: 20 | 30 days supply | Qty: 30 | Fill #0

## 2019-10-24 NOTE — Research (Signed)
Rachel Underwood was here for her month 63 visit for Reprieve, A Randomized Trial to Prevent Vascular Events in HIV (study drug is Pitavastatin 4mg  or placebo)  She says she has been taking her meds and had missed 1 dose this week due to moving back to [place] with her daughter. She said she was diagnosed with Lupus this year, but I am not sure exactly where. She has chronic muscle pain and some weakness in her extremities. She will be returning for study in [place].

## 2019-11-10 ENCOUNTER — Ambulatory Visit: Payer: Medicaid Other | Admitting: Internal Medicine

## 2019-11-13 MED FILL — BIKTARVY 50-200-25 MG TABS: 50-200-25 | 30 days supply | Qty: 30 | Fill #4

## 2019-12-10 ENCOUNTER — Telehealth: Payer: Self-pay

## 2019-12-10 ENCOUNTER — Ambulatory Visit: Payer: Medicaid Other | Admitting: Internal Medicine

## 2019-12-10 NOTE — Telephone Encounter (Signed)
Attempted to call patient to reschedule missed appointment.  Sent MyChart message as well. Rachel Underwood

## 2019-12-15 MED FILL — BIKTARVY 50-200-25 MG TABS: 50-200-25 | 30 days supply | Qty: 30 | Fill #5

## 2019-12-23 MED FILL — BIKTARVY 50-200-25 MG TABS: 50-200-25 | 30 days supply | Qty: 30 | Fill #5

## 2020-01-08 ENCOUNTER — Other Ambulatory Visit: Payer: Self-pay | Admitting: Pharmacist

## 2020-01-08 ENCOUNTER — Other Ambulatory Visit: Payer: Self-pay | Admitting: Family Medicine

## 2020-01-08 DIAGNOSIS — B20 Human immunodeficiency virus [HIV] disease: Secondary | ICD-10-CM

## 2020-01-08 DIAGNOSIS — K2100 Gastro-esophageal reflux disease with esophagitis, without bleeding: Secondary | ICD-10-CM

## 2020-01-14 MED FILL — BIKTARVY 50-200-25 MG TABS: 50-200-25 | 30 days supply | Qty: 30 | Fill #0

## 2020-01-14 MED FILL — DEXILANT DR 60 MG CAPSULE: 60 | 30 days supply | Qty: 30 | Fill #0

## 2020-01-20 ENCOUNTER — Ambulatory Visit: Payer: Medicaid Other | Admitting: Internal Medicine

## 2020-02-13 MED FILL — BIKTARVY 50-200-25 MG TABS: 50-200-25 | 30 days supply | Qty: 30 | Fill #1

## 2020-02-13 MED FILL — DEXILANT DR 60 MG CAPSULE: 60 | 30 days supply | Qty: 30 | Fill #1

## 2020-03-09 ENCOUNTER — Encounter: Payer: Medicaid Other | Admitting: *Deleted

## 2020-03-10 ENCOUNTER — Other Ambulatory Visit: Payer: Self-pay | Admitting: Internal Medicine

## 2020-03-10 DIAGNOSIS — B20 Human immunodeficiency virus [HIV] disease: Secondary | ICD-10-CM

## 2020-03-10 MED FILL — RESTASIS 0.05% EYE EMULSION: 0.05 | 30 days supply | Qty: 60 | Fill #3

## 2020-03-10 MED FILL — DEXILANT DR 60 MG CAPSULE: 60 | 30 days supply | Qty: 30 | Fill #2

## 2020-03-17 ENCOUNTER — Ambulatory Visit (INDEPENDENT_AMBULATORY_CARE_PROVIDER_SITE_OTHER): Payer: Medicaid Other | Admitting: Pharmacist

## 2020-03-17 ENCOUNTER — Other Ambulatory Visit (HOSPITAL_COMMUNITY)
Admission: RE | Admit: 2020-03-17 | Discharge: 2020-03-17 | Disposition: A | Discharge status: Home / Self Care | Payer: Medicaid Other | Source: Ambulatory Visit | Attending: Internal Medicine | Admitting: Internal Medicine

## 2020-03-17 ENCOUNTER — Other Ambulatory Visit: Payer: Self-pay

## 2020-03-17 ENCOUNTER — Other Ambulatory Visit: Payer: Self-pay | Admitting: Pharmacist

## 2020-03-17 ENCOUNTER — Encounter (INDEPENDENT_AMBULATORY_CARE_PROVIDER_SITE_OTHER): Payer: Self-pay | Admitting: *Deleted

## 2020-03-17 VITALS — BP 153/103 | HR 69 | Temp 97.9°F | Wt 261.0 lb

## 2020-03-17 DIAGNOSIS — F319 Bipolar disorder, unspecified: Secondary | ICD-10-CM

## 2020-03-17 DIAGNOSIS — F3170 Bipolar disorder, currently in remission, most recent episode unspecified: Secondary | ICD-10-CM | POA: Diagnosis not present

## 2020-03-17 DIAGNOSIS — Z113 Encounter for screening for infections with a predominantly sexual mode of transmission: Secondary | ICD-10-CM | POA: Insufficient documentation

## 2020-03-17 DIAGNOSIS — J301 Allergic rhinitis due to pollen: Secondary | ICD-10-CM | POA: Diagnosis not present

## 2020-03-17 DIAGNOSIS — B20 Human immunodeficiency virus [HIV] disease: Secondary | ICD-10-CM

## 2020-03-17 DIAGNOSIS — I10 Essential (primary) hypertension: Secondary | ICD-10-CM | POA: Diagnosis not present

## 2020-03-17 DIAGNOSIS — Z006 Encounter for examination for normal comparison and control in clinical research program: Secondary | ICD-10-CM

## 2020-03-17 MED ORDER — ACYCLOVIR 400 MG PO TABS: 400.0000 mg | ORAL_TABLET | Freq: Two times a day (BID) | ORAL | 5 refills | Status: DC | PRN

## 2020-03-17 MED ORDER — LAMOTRIGINE 200 MG PO TABS: 200.0000 mg | ORAL_TABLET | Freq: Every day | ORAL | 3 refills | Status: DC

## 2020-03-17 MED ORDER — ALBUTEROL SULFATE HFA 108 (90 BASE) MCG/ACT IN AERS: 2.0000 | INHALATION_SPRAY | RESPIRATORY_TRACT | 5 refills | Status: DC | PRN

## 2020-03-17 MED ORDER — PROPRANOLOL HCL 10 MG PO TABS: 10.0000 mg | ORAL_TABLET | Freq: Two times a day (BID) | ORAL | 3 refills | Status: DC

## 2020-03-17 MED ORDER — EPIPEN 2-PAK 0.3 MG/0.3ML IJ SOAJ: 0.3000 mg | Freq: Once | INTRAMUSCULAR | 0 refills | Status: AC

## 2020-03-17 MED ORDER — BIKTARVY 50-200-25 MG PO TABS: 1.0000 | ORAL_TABLET | Freq: Every day | ORAL | 5 refills | Status: DC

## 2020-03-17 MED ORDER — FUROSEMIDE 20 MG PO TABS: 20.0000 mg | ORAL_TABLET | Freq: Every day | ORAL | 3 refills | Status: DC

## 2020-03-17 MED FILL — ACYCLOVIR 400 MG TABLET: 400 | 30 days supply | Qty: 60 | Fill #0

## 2020-03-17 MED FILL — BIKTARVY 50-200-25 MG TABS: 50-200-25 | 30 days supply | Qty: 30 | Fill #0

## 2020-03-17 MED FILL — PROPRANOLOL 10 MG TABLET: 10 | 30 days supply | Qty: 60 | Fill #0

## 2020-03-17 MED FILL — SUBVENITE 200 MG TABS: 200 | 30 days supply | Qty: 30 | Fill #0

## 2020-03-17 MED FILL — EPINEPHRINE 0.3 MG AUTO-INJ: 0.3 | 30 days supply | Qty: 2 | Fill #0

## 2020-03-17 MED FILL — FUROSEMIDE 20 MG TABS: 20 | 30 days supply | Qty: 30 | Fill #0

## 2020-03-17 MED FILL — ALBUTEROL SULFATE HFA 108 (: 108 (90 BAS | 17 days supply | Qty: 18 | Fill #0

## 2020-03-17 NOTE — Research (Signed)
Rachel Underwood is here for her month 63 Reprieve visit. She has been without all her medication since last week. States that her car broke down and she has not had access to her medications which are in [place]. She was seen by pharmacy today and they refilled her medications. BP elevated today 153/103. States that she has headaches and visual changes "alll the time". Complaining of pain in abdomen today. Feels that it may be related to a possible STI. Pharmacy did obtain HIV and STI labs today. Discussed with Dr. Baxter Flattery. Instructed to go to urgent care/emergency room if symptoms change or become worse. Plans to pick up and restart medications. She says that she does have a PCP in [place] but she has not seen them in quite a while. States that she does have a BP monitor at home and agreed to monitor her BP. She is scheduled to return in August for her next study visit.

## 2020-03-17 NOTE — Progress Notes (Signed)
HPI: Rachel Underwood is a 49 y.o. female who presents to the [facility] for HIV follow-up.  Patient Active Problem List   Diagnosis Date Noted  . Bilateral carpal tunnel syndrome 02/17/2016  . Allergic rhinitis due to pollen 02/17/2016  . Headache 11/09/2015  . Chronic back pain 11/09/2015  . Diarrhea 05/19/2015  . Cysts of both ovaries 05/12/2015  . Abdominal wall hernia 02/03/2015  . Abdominal wall mass of periumbilical region [id number]  . Schatzki's ring   . Diverticulosis of colon without hemorrhage   . Other hemorrhoids   . Rectal bleeding 12/11/2014  . GERD (gastroesophageal reflux disease) 12/11/2014  . Esophageal dysphagia 12/11/2014  . Constipation 12/11/2014  . S/P hysterectomy 09/01/2014  . Pelvic pain in female 07/22/2014  . Anovulation 06/18/2014  . DUB (dysfunctional uterine bleeding) 06/18/2014  . Menorrhagia with irregular cycle 06/03/2014  . Enlarged uterus 06/03/2014  . HTN (hypertension) 01/15/2014  . Human immunodeficiency virus (HIV) disease ([place]) 07/17/2013  . Schizophrenia ([place]) 07/17/2013  . Bipolar disorder, unspecified ([place]) 07/17/2013    Patient's Medications  New Prescriptions   No medications on file  Previous Medications   ACYCLOVIR (ZOVIRAX) 400 MG TABLET    Take 1 tablet (400 mg total) by mouth 2 (two) times daily as needed (for prophylaxis).   ALBUTEROL (PROVENTIL HFA;VENTOLIN HFA) 108 (90 BASE) MCG/ACT INHALER    Inhale 2 puffs into the lungs every 4 (four) hours as needed for wheezing or shortness of breath. Reported on 04/06/2016   BIKTARVY 50-200-25 MG TABS TABLET    TAKE 1 TABLET BY MOUTH DAILY.   CETIRIZINE (ZYRTEC) 10 MG TABLET    Take 1 tablet (10 mg total) by mouth daily.   DEXILANT 60 MG CAPSULE    TAKE 1 CAPSULE (60 MG TOTAL) BY MOUTH DAILY.   EPIPEN 2-PAK 0.3 MG/0.3ML SOAJ INJECTION    Inject 0.3 mLs (0.3 mg total) into the skin once.   FUROSEMIDE (LASIX) 20 MG TABLET    TAKE 1 TABLET (20 MG TOTAL) BY MOUTH DAILY.   LAMOTRIGINE (LAMICTAL) 200 MG TABLET    Take 1 tablet (200 mg total) by mouth 2 (two) times daily.   LINACLOTIDE (LINZESS) 290 MCG CAPS CAPSULE    Take 1 capsule (290 mcg total) by mouth daily. On empty stomach   NYSTATIN (MYCOSTATIN/NYSTOP) POWDER    Apply topically 3 (three) times daily. For up to 2 weeks for rash under breast   ONDANSETRON (ZOFRAN) 8 MG TABLET    Take 1 tablet (8 mg total) by mouth every 8 (eight) hours as needed for nausea or vomiting.   PROPRANOLOL (INDERAL) 10 MG TABLET    Take 10 mg by mouth 2 (two) times daily.   SERTRALINE (ZOLOFT) 100 MG TABLET    Take 250 mg by mouth every morning. Takes 2 daily   TOPIRAMATE (TOPAMAX) 50 MG TABLET    Take 1 tablet (50 mg total) by mouth 2 (two) times daily.  Modified Medications   No medications on file  Discontinued Medications   No medications on file    Allergies: Allergies  Allergen Reactions  . Flecainide Swelling  . Naproxen Anaphylaxis and Hives  . Piroxicam Anaphylaxis  . Abilify [Aripiprazole]   . Citalopram Hives  . Codeine Nausea And Vomiting  . Depakote [Divalproex Sodium] Swelling  . Gabapentin     Hives   . Haldol [Haloperidol Lactate] Other (See Comments)    Slurs speech and "stumble"  . Indomethacin Swelling  Dizzy, tigling of face  . Invega [Paliperidone] Other (See Comments)    Slurred speech, drooling  . Latuda [Lurasidone Hcl] Itching  . Lithium Hives  . Promethazine Swelling    Arms and hands  . Trazodone And Nefazodone Other (See Comments)    Dry mouth, congestion, severe dryness.  [name] [Escitalopram Oxalate] Rash    Past Medical History: Past Medical History:  Diagnosis Date  . Abnormal Pap smear of cervix 2005(EST)   [facility] [place]  . Allergy   . Asthma   . Bipolar 1 disorder ([place])   . Borderline personality disorder ([place])   . BPD (bronchopulmonary dysplasia)   . Carpal tunnel syndrome    bilateral wrist  . Depression   . Dysmenorrhea  06/03/2014  . Enlarged uterus 06/03/2014  . Fibromyalgia   . GERD (gastroesophageal reflux disease)   . Herpes simplex without mention of complication   . HIV (human immunodeficiency virus infection) ([place])   . Hyperlipidemia   . Hypertension   . Menorrhagia with irregular cycle 06/03/2014  . Osteoarthritis   . Seizures ([place])    had as a teenager, unknown etiology and no meds.    Social History: Social History   Socioeconomic History  . Marital status: Divorced    Spouse name: Not on file  . Number of children: 3  . Years of education: Not on file  . Highest education level: Not on file  Occupational History  . Not on file  Tobacco Use  . Smoking status: Current Every Day Smoker    Packs/day: 0.50    Years: 30.00    Pack years: 15.00    Types: Cigarettes    Start date: 12/04/1982  . Smokeless tobacco: Never Used  Substance and Sexual Activity  . Alcohol use: No    Alcohol/week: 0.0 standard drinks  . 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 Sexual activity: Yes    Partners: Male    Birth control/protection: Condom  Other Topics Concern  . Not on file  Social History Narrative  . Not on file   Social Determinants of Health   Financial Resource Strain:   . Difficulty of Paying Living Expenses:   Food Insecurity:   . Worried About Charity fundraiser in the Last Year:   . Arboriculturist in the Last Year:   Transportation Needs:   . Film/video editor (Medical):   Marland Kitchen Lack of Transportation (Non-Medical):   Physical Activity:   . Days of Exercise per Week:   . Minutes of Exercise per Session:   Stress:   . Feeling of Stress :   Social Connections:   . Frequency of Communication with Friends and Family:   . Frequency of Social Gatherings with Friends and Family:   . Attends Religious Services:   . Active Member of Clubs or Organizations:   . Attends Theatre manager Meetings:   Marland Kitchen Marital Status:     Labs: Lab Results  Component Value Date   HIV1RNAQUANT <20 NOT DETECTED 05/26/2019   HIV1RNAQUANT <20 05/10/2016   HIV1RNAQUANT <20 11/10/2015   CD4TABS 331 (L) 05/26/2019   CD4TABS 710 05/10/2016   CD4TABS 770 11/10/2015    RPR and STI Lab Results  Component Value Date   LABRPR NON-REACTIVE 05/26/2019  LABRPR NON REAC 09/29/2014   LABRPR NON REAC 08/26/2013   LABRPR NON REAC 07/15/2013    STI Results GC CT  05/26/2019 Negative Negative    Hepatitis B Lab Results  Component Value Date   HEPBSAB POS (A) 07/15/2013   HEPBSAG NEGATIVE 12/24/2014   HEPBCAB NON REACTIVE 12/24/2014   Hepatitis C No results found for: HEPCAB, HCVRNAPCRQN Hepatitis A Lab Results  Component Value Date   HAV POS (A) 07/15/2013   Lipids: Lab Results  Component Value Date   CHOL 201 (H) 06/20/2019   TRIG 133 06/20/2019   HDL 48 06/20/2019   CHOLHDL 4.2 06/20/2019   VLDL 34 02/09/2015   LDLCALC 126 (H) 06/20/2019    Current HIV Regimen: Biktarvy  Assessment: Rachel Underwood is here today for a follow-up HIV appointment after missing multiple appointments with Dr. Baxter Flattery. She states that her greatest barrier to making appointments is transportation. Most recently, her car broke down and she hasn't had access to her medications for two weeks. She made it here today after catching a ride with her friend/"adopted daughter". Prior to this, she was taking her medications regularly. She gets her medications shipped to her house. Recently, she has been trying to move back to the [place] area.   Today, she has complaints of pelvic pain, vaginal itchiness, and pain with sex. She has a history of trichomonas infection and is concerned that she is reinfected. She has had sex on multiple occasions where the condom broke due to vaginal dryness or a condom was not used.  Her most recent Pap smear was 06/2019 with no abnormalities showing surgically absent cervix  - h/o total hysterectomy. In addition to requesting STD screening, she requests refills on her other medications.   We will test her for STD/STIs, check labs associated with HIV infection, and send in refills for albuterol, Biktarvy, Lamictal, acyclovir, Lasix, propranolol, and an Epipen. Due to genitourinary issues, we will follow up with the patient after her labs result to schedule an appointment with [facility] for a Pap smear. We also gave her information to obtain the COVID-19 vaccine series and gave her resources for transportation to and from the clinic.   Plan: - Check HIV RNA, CD4, urine cytology, RPR, CBC, and BMP - Refill maintenance medications  - F/u with patient to schedule appointment with Nehemiah Settle, PharmD PGY1 Pharmacy Resident [facility] 03/17/2020, 9:25 AM

## 2020-03-17 NOTE — Addendum Note (Signed)
Addended by: Magda Kiel L on: 03/17/2020 12:02 PM   Modules accepted: Level of Service

## 2020-03-18 LAB — T-HELPER CELL (CD4) - (RCID CLINIC ONLY)
CD4 % Helper T Cell: 34 % (ref 33–65)
CD4 T Cell Abs: 636 /uL (ref 400–1790)

## 2020-03-18 LAB — URINE CYTOLOGY ANCILLARY ONLY
Chlamydia: NEGATIVE
Comment: NEGATIVE
Comment: NEGATIVE
Comment: NORMAL
Neisseria Gonorrhea: NEGATIVE
Trichomonas: NEGATIVE

## 2020-03-21 LAB — CBC
HCT: 43 % (ref 35.0–45.0)
Hemoglobin: 14.6 g/dL (ref 11.7–15.5)
MCH: 31.8 pg (ref 27.0–33.0)
MCHC: 34 g/dL (ref 32.0–36.0)
MCV: 93.7 fL (ref 80.0–100.0)
MPV: 9.6 fL (ref 7.5–12.5)
Platelets: 245 10*3/uL (ref 140–400)
RBC: 4.59 10*6/uL (ref 3.80–5.10)
RDW: 12.2 % (ref 11.0–15.0)
WBC: 7.9 10*3/uL (ref 3.8–10.8)

## 2020-03-21 LAB — BASIC METABOLIC PANEL
BUN: 10 mg/dL (ref 7–25)
CO2: 27 mmol/L (ref 20–32)
Calcium: 9.3 mg/dL (ref 8.6–10.2)
Chloride: 105 mmol/L (ref 98–110)
Creat: 0.83 mg/dL (ref 0.50–1.10)
Glucose, Bld: 94 mg/dL (ref 65–99)
Potassium: 3.9 mmol/L (ref 3.5–5.3)
Sodium: 138 mmol/L (ref 135–146)

## 2020-03-21 LAB — HIV-1 RNA QUANT-NO REFLEX-BLD
HIV 1 RNA Quant: 38 copies/mL — ABNORMAL HIGH
HIV-1 RNA Quant, Log: 1.58 Log copies/mL — ABNORMAL HIGH

## 2020-03-21 LAB — RPR: RPR Ser Ql: NONREACTIVE

## 2020-04-07 ENCOUNTER — Other Ambulatory Visit: Payer: Self-pay | Admitting: Family Medicine

## 2020-04-07 DIAGNOSIS — K2100 Gastro-esophageal reflux disease with esophagitis, without bleeding: Secondary | ICD-10-CM

## 2020-05-10 ENCOUNTER — Other Ambulatory Visit: Payer: Self-pay

## 2020-05-10 ENCOUNTER — Other Ambulatory Visit: Payer: Self-pay | Admitting: Pharmacist

## 2020-05-10 ENCOUNTER — Telehealth: Payer: Self-pay | Admitting: Pharmacy Technician

## 2020-05-10 ENCOUNTER — Ambulatory Visit (INDEPENDENT_AMBULATORY_CARE_PROVIDER_SITE_OTHER): Payer: Medicaid Other | Admitting: Pharmacist

## 2020-05-10 ENCOUNTER — Other Ambulatory Visit (HOSPITAL_COMMUNITY)
Admission: RE | Admit: 2020-05-10 | Discharge: 2020-05-10 | Disposition: A | Discharge status: Home / Self Care | Payer: Medicaid Other | Source: Ambulatory Visit | Attending: Internal Medicine | Admitting: Internal Medicine

## 2020-05-10 DIAGNOSIS — Z113 Encounter for screening for infections with a predominantly sexual mode of transmission: Secondary | ICD-10-CM

## 2020-05-10 DIAGNOSIS — Z7689 Persons encountering health services in other specified circumstances: Secondary | ICD-10-CM | POA: Diagnosis not present

## 2020-05-10 DIAGNOSIS — J301 Allergic rhinitis due to pollen: Secondary | ICD-10-CM | POA: Diagnosis not present

## 2020-05-10 DIAGNOSIS — B20 Human immunodeficiency virus [HIV] disease: Secondary | ICD-10-CM

## 2020-05-10 MED ORDER — LEVOCETIRIZINE DIHYDROCHLORIDE 5 MG PO TABS: 5.0000 mg | ORAL_TABLET | Freq: Every evening | ORAL | 3 refills | Status: DC

## 2020-05-10 NOTE — Progress Notes (Signed)
HPI: Rachel Underwood is a 49 y.o. female who presents to the [place] clinic for HIV follow-up.  Patient Active Problem List   Diagnosis Date Noted  . Bilateral carpal tunnel syndrome 02/17/2016  . Allergic rhinitis due to pollen 02/17/2016  . Headache 11/09/2015  . Chronic back pain 11/09/2015  . Diarrhea 05/19/2015  . Cysts of both ovaries 05/12/2015  . Abdominal wall hernia 02/03/2015  . Abdominal wall mass of periumbilical region 71/24/5809  . Schatzki's ring   . Diverticulosis of colon without hemorrhage   . Other hemorrhoids   . Rectal bleeding 12/11/2014  . GERD (gastroesophageal reflux disease) 12/11/2014  . Esophageal dysphagia 12/11/2014  . Constipation 12/11/2014  . S/P hysterectomy 09/01/2014  . Pelvic pain in female 07/22/2014  . Anovulation 06/18/2014  . DUB (dysfunctional uterine bleeding) 06/18/2014  . Menorrhagia with irregular cycle 06/03/2014  . Enlarged uterus 06/03/2014  . HTN (hypertension) 01/15/2014  . Human immunodeficiency virus (HIV) disease ([place]) 07/17/2013  . Schizophrenia ([place]) 07/17/2013  . Bipolar disorder, unspecified ([place]) 07/17/2013    Patient's Medications  New Prescriptions   LEVOCETIRIZINE (XYZAL) 5 MG TABLET    Take 1 tablet (5 mg total) by mouth every evening.  Previous Medications   ACYCLOVIR (ZOVIRAX) 400 MG TABLET    Take 1 tablet (400 mg total) by mouth 2 (two) times daily as needed (for prophylaxis).   ALBUTEROL (VENTOLIN HFA) 108 (90 BASE) MCG/ACT INHALER    Inhale 2 puffs into the lungs every 4 (four) hours as needed for wheezing or shortness of breath. Reported on 04/06/2016   BICTEGRAVIR-EMTRICITABINE-TENOFOVIR AF (BIKTARVY) 50-200-25 MG TABS TABLET    Take 1 tablet by mouth daily.   CETIRIZINE (ZYRTEC) 10 MG TABLET    Take 1 tablet (10 mg total) by mouth daily.   DEXLANSOPRAZOLE (DEXILANT) 60 MG CAPSULE    Take 1 capsule (60 mg total) by mouth daily. (Needs to be seen before next refill)   FUROSEMIDE (LASIX) 20 MG TABLET     Take 1 tablet (20 mg total) by mouth daily.   LAMOTRIGINE (LAMICTAL) 200 MG TABLET    Take 1 tablet (200 mg total) by mouth daily.   LINACLOTIDE (LINZESS) 290 MCG CAPS CAPSULE    Take 1 capsule (290 mcg total) by mouth daily. On empty stomach   NYSTATIN (MYCOSTATIN/NYSTOP) POWDER    Apply topically 3 (three) times daily. For up to 2 weeks for rash under breast   ONDANSETRON (ZOFRAN) 8 MG TABLET    Take 1 tablet (8 mg total) by mouth every 8 (eight) hours as needed for nausea or vomiting.   PROPRANOLOL (INDERAL) 10 MG TABLET    Take 1 tablet (10 mg total) by mouth 2 (two) times daily.   SERTRALINE (ZOLOFT) 100 MG TABLET    Take 250 mg by mouth every morning. Takes 2 daily   TOPIRAMATE (TOPAMAX) 50 MG TABLET    Take 1 tablet (50 mg total) by mouth 2 (two) times daily.  Modified Medications   No medications on file  Discontinued Medications   No medications on file    Allergies: Allergies  Allergen Reactions  . Flecainide Swelling  . Naproxen Anaphylaxis and Hives  . Piroxicam Anaphylaxis  . Abilify [Aripiprazole]   . Citalopram Hives  . Codeine Nausea And Vomiting  . Depakote [Divalproex Sodium] Swelling  . Gabapentin     Hives   . Haldol [Haloperidol Lactate] Other (See Comments)    Slurs speech and "stumble"  . Indomethacin Swelling  Dizzy, tigling of face  . Invega [Paliperidone] Other (See Comments)    Slurred speech, drooling  . Latuda [Lurasidone Hcl] Itching  . Lithium Hives  . Promethazine Swelling    Arms and hands  . Trazodone And Nefazodone Other (See Comments)    Dry mouth, congestion, severe dryness.  [name] [Escitalopram Oxalate] Rash    Past Medical History: Past Medical History:  Diagnosis Date  . Abnormal Pap smear of cervix 2005(EST)   ck[facility] for abnl pap- [facility] [place]  . Allergy   . Asthma   . Bipolar 1 disorder ([place])   . Borderline personality disorder ([place])   . BPD (bronchopulmonary dysplasia)   . Carpal tunnel syndrome     bilateral wrist  . Depression   . Dysmenorrhea 06/03/2014  . Enlarged uterus 06/03/2014  . Fibromyalgia   . GERD (gastroesophageal reflux disease)   . Herpes simplex without mention of complication   . HIV (human immunodeficiency virus infection) ([place])   . Hyperlipidemia   . Hypertension   . Menorrhagia with irregular cycle 06/03/2014  . Osteoarthritis   . Seizures ([place])    had as a teenager, unknown etiology and no meds.    Social History: Social History   Socioeconomic History  . Marital status: Divorced    Spouse name: Not on file  . Number of children: 3  . Years of education: Not on file  . Highest education level: Not on file  Occupational History  . Not on file  Tobacco Use  . Smoking status: Current Every Day Smoker    Packs/day: 0.50    Years: 30.00    Pack years: 15.00    Types: Cigarettes    Start date: 12/04/1982  . Smokeless tobacco: Never Used  Substance and Sexual Activity  . Alcohol use: No    Alcohol/week: 0.0 standard drinks  . 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 Sexual activity: Yes    Partners: Male    Birth control/protection: Condom  Other Topics Concern  . Not on file  Social History Narrative  . Not on file   Social Determinants of Health   Financial Resource Strain:   . Difficulty of Paying Living Expenses:   Food Insecurity:   . Worried About Charity fundraiser in the Last Year:   . Arboriculturist in the Last Year:   Transportation Needs:   . Film/video editor (Medical):   Marland Kitchen Lack of Transportation (Non-Medical):   Physical Activity:   . Days of Exercise per Week:   . Minutes of Exercise per Session:   Stress:   . Feeling of Stress :   Social Connections:   . Frequency of Communication with Friends and Family:   . Frequency of Social Gatherings with Friends and Family:   . Attends Religious Services:   . Active Member of  Clubs or Organizations:   . Attends Archivist Meetings:   Marland Kitchen Marital Status:     Labs: Lab Results  Component Value Date   HIV1RNAQUANT 38 (H) 03/17/2020   HIV1RNAQUANT <20 NOT DETECTED 05/26/2019   HIV1RNAQUANT <20 05/10/2016   CD4TABS 636 03/17/2020   CD4TABS 331 (L) 05/26/2019   CD4TABS 710 05/10/2016    RPR and STI Lab Results  Component Value Date   LABRPR NON-REACTIVE 03/17/2020  LABRPR NON-REACTIVE 05/26/2019   LABRPR NON REAC 09/29/2014   LABRPR NON REAC 08/26/2013   LABRPR NON REAC 07/15/2013    STI Results GC CT  03/17/2020 Negative Negative  05/26/2019 Negative Negative    Hepatitis B Lab Results  Component Value Date   HEPBSAB POS (A) 07/15/2013   HEPBSAG NEGATIVE 12/24/2014   HEPBCAB NON REACTIVE 12/24/2014   Hepatitis C No results found for: HEPCAB, HCVRNAPCRQN Hepatitis A Lab Results  Component Value Date   HAV POS (A) 07/15/2013   Lipids: Lab Results  Component Value Date   CHOL 201 (H) 06/20/2019   TRIG 133 06/20/2019   HDL 48 06/20/2019   CHOLHDL 4.2 06/20/2019   VLDL 34 02/09/2015   LDLCALC 126 (H) 06/20/2019    Current HIV Regimen: Biktarvy  Assessment: Coralee is here today to follow up for her HIV infection.  I previously saw her in [place] where she had been off of [place] for 2 weeks due to transportation issues and not being able to get her medication. She comes in today and has her suitcase with her as she is going to [place] this evening for 2 weeks. She states that she is back taking her Biktarvy every day with no issues and gets it from [facility]. She is going there after this visit to pick up a refill.    She is complaining of allergy type symptoms and asking for Xyzal which worked well for her in the past. I will send a Rx for her to see if Medicaid will cover it. If not, it is OTC. She is still in need of a PAP smear and follow up with Dr. Baxter Flattery so I will make those appointments for her today,  preferably on the same day so she can stay in [facility] for the day. She is also complaining of vaginal dryness and wants to be tested again since she had sexual intercourse where the condom broke.  Will get labs today and make her follow up appointments.   Plan: - Continue Biktarvy PO once daily - Xyzal 5 mg PO daily - HIV viral load, urine cytology today - F/u with Dr. Baxter Flattery 7/7 at 1130am - F/u with [facility] for PAP 7/7 at [place]. Kensi Karr, PharmD, BCIDP, AAHIVP, CPP Clinical Pharmacist Practitioner Infectious Diseases [facility] 05/10/2020, 2:16 PM

## 2020-05-10 NOTE — Telephone Encounter (Signed)
[facility] Patient Advocate Encounter    Findings of the benefits investigation:   Insurance: NCMED remains active  Patient active fills all medications at [facility]. Last refilled Biktarvy on 04/14/2020.  Bartholomew Crews, CPhT Specialty Pharmacy Patient [name] for Infectious Disease Phone: [phone] Fax: [phone] 05/10/2020 8:51 AM

## 2020-05-11 LAB — URINE CYTOLOGY ANCILLARY ONLY
Chlamydia: NEGATIVE
Comment: NEGATIVE
Comment: NEGATIVE
Comment: NORMAL
Neisseria Gonorrhea: NEGATIVE
Trichomonas: NEGATIVE

## 2020-05-12 LAB — HIV-1 RNA QUANT-NO REFLEX-BLD
HIV 1 RNA Quant: <20 copies/mL
HIV-1 RNA Quant, Log: <1.3 Log copies/mL

## 2020-05-24 ENCOUNTER — Other Ambulatory Visit: Payer: Self-pay | Admitting: Family Medicine

## 2020-05-24 DIAGNOSIS — K2100 Gastro-esophageal reflux disease with esophagitis, without bleeding: Secondary | ICD-10-CM

## 2020-05-24 MED FILL — ACYCLOVIR 400 MG TABLET: 400 | 30 days supply | Qty: 60 | Fill #2

## 2020-05-24 MED FILL — PROPRANOLOL 10 MG TABLET: 10 | 30 days supply | Qty: 60 | Fill #2

## 2020-05-24 MED FILL — SUBVENITE 200 MG TABS: 200 | 30 days supply | Qty: 30 | Fill #2

## 2020-05-24 MED FILL — NYSTATIN 100000 UNIT/GM POW: 100000 | 30 days supply | Qty: 30 | Fill #0

## 2020-05-24 MED FILL — FUROSEMIDE 20 MG TABS: 20 | 30 days supply | Qty: 30 | Fill #2

## 2020-05-24 NOTE — Telephone Encounter (Signed)
[place]. NTBS 30 days given 04/14/20

## 2020-05-25 NOTE — Telephone Encounter (Signed)
Patient aware and appt made 

## 2020-06-04 ENCOUNTER — Encounter: Payer: Self-pay | Admitting: Family Medicine

## 2020-06-04 ENCOUNTER — Ambulatory Visit: Payer: Medicaid Other | Admitting: Family Medicine

## 2020-06-08 ENCOUNTER — Ambulatory Visit: Payer: Medicaid Other | Admitting: Infectious Diseases

## 2020-06-09 ENCOUNTER — Ambulatory Visit: Payer: Medicaid Other | Admitting: Internal Medicine

## 2020-06-09 ENCOUNTER — Ambulatory Visit: Payer: Medicaid Other | Admitting: Infectious Diseases

## 2020-06-17 MED FILL — BIKTARVY 50-200-25 MG TABS: 50-200-25 | 30 days supply | Qty: 30 | Fill #3

## 2020-06-30 ENCOUNTER — Ambulatory Visit: Payer: Medicaid Other | Admitting: Internal Medicine

## 2020-06-30 ENCOUNTER — Ambulatory Visit: Payer: Medicaid Other | Admitting: Infectious Diseases

## 2020-07-13 ENCOUNTER — Encounter (INDEPENDENT_AMBULATORY_CARE_PROVIDER_SITE_OTHER): Payer: Self-pay | Admitting: *Deleted

## 2020-07-13 ENCOUNTER — Other Ambulatory Visit: Payer: Self-pay

## 2020-07-13 VITALS — BP 113/79 | HR 88 | Temp 98.9°F | Wt 251.2 lb

## 2020-07-13 DIAGNOSIS — Z006 Encounter for examination for normal comparison and control in clinical research program: Secondary | ICD-10-CM

## 2020-07-13 MED FILL — FUROSEMIDE 20 MG TABS: 20 | 30 days supply | Qty: 30 | Fill #3

## 2020-07-13 MED FILL — LEVOCETIRIZINE 5 MG TABLET: 5 | 30 days supply | Qty: 30 | Fill #1

## 2020-07-13 MED FILL — BIKTARVY 50-200-25 MG TABS: 50-200-25 | 30 days supply | Qty: 30 | Fill #4

## 2020-07-13 MED FILL — PROPRANOLOL 10 MG TABLET: 10 | 30 days supply | Qty: 60 | Fill #3

## 2020-07-13 MED FILL — SUBVENITE 200 MG TABS: 200 | 30 days supply | Qty: 30 | Fill #3

## 2020-07-13 NOTE — Research (Signed)
Rachel Underwood was here for her month 42 visit for Reprieve, A Randomized Trial to Prevent Vascular Events in HIV (study drug is Pitavastatin 4mg  or placebo). She is limping and says that her left knee has been bothering her for several months and is supposed to get an MRI done soon. She is staying in [place], [place] with family but says she is mostly homeless, because no one has any room for her. She says she is taking her pitavastatin without any problem but did miss 2 pills this week. She has an appt. With Dr. Baxter Flattery in September and will return for research in December.

## 2020-08-30 ENCOUNTER — Ambulatory Visit: Payer: Medicaid Other | Admitting: Internal Medicine

## 2020-09-06 DIAGNOSIS — F250 Schizoaffective disorder, bipolar type: Secondary | ICD-10-CM | POA: Diagnosis not present

## 2020-09-10 ENCOUNTER — Other Ambulatory Visit (HOSPITAL_COMMUNITY): Payer: Self-pay

## 2020-09-10 DIAGNOSIS — F603 Borderline personality disorder: Secondary | ICD-10-CM | POA: Diagnosis not present

## 2020-09-10 MED FILL — SERTRALINE HCL 100 MG TABS: 100 | 30 days supply | Qty: 60 | Fill #0

## 2020-09-23 ENCOUNTER — Other Ambulatory Visit (HOSPITAL_COMMUNITY)
Admission: RE | Admit: 2020-09-23 | Discharge: 2020-09-23 | Disposition: A | Discharge status: Home / Self Care | Payer: Medicaid Other | Source: Ambulatory Visit | Attending: Internal Medicine | Admitting: Internal Medicine

## 2020-09-23 ENCOUNTER — Other Ambulatory Visit: Payer: Self-pay | Admitting: Internal Medicine

## 2020-09-23 ENCOUNTER — Encounter: Payer: Self-pay | Admitting: Internal Medicine

## 2020-09-23 ENCOUNTER — Other Ambulatory Visit: Payer: Self-pay

## 2020-09-23 ENCOUNTER — Ambulatory Visit (INDEPENDENT_AMBULATORY_CARE_PROVIDER_SITE_OTHER): Payer: Medicaid Other | Admitting: Internal Medicine

## 2020-09-23 VITALS — BP 134/78 | HR 88 | Ht 66.0 in | Wt 238.0 lb

## 2020-09-23 DIAGNOSIS — B20 Human immunodeficiency virus [HIV] disease: Secondary | ICD-10-CM

## 2020-09-23 DIAGNOSIS — N898 Other specified noninflammatory disorders of vagina: Secondary | ICD-10-CM

## 2020-09-23 DIAGNOSIS — Z23 Encounter for immunization: Secondary | ICD-10-CM

## 2020-09-23 DIAGNOSIS — F3170 Bipolar disorder, currently in remission, most recent episode unspecified: Secondary | ICD-10-CM

## 2020-09-23 DIAGNOSIS — Z202 Contact with and (suspected) exposure to infections with a predominantly sexual mode of transmission: Secondary | ICD-10-CM | POA: Diagnosis not present

## 2020-09-23 MED ORDER — FLUCONAZOLE 150 MG PO TABS: 150.0000 mg | ORAL_TABLET | Freq: Every day | ORAL | 0 refills | Status: DC

## 2020-09-23 MED ORDER — METRONIDAZOLE 500 MG PO TABS: 500.0000 mg | ORAL_TABLET | Freq: Two times a day (BID) | ORAL | 0 refills | Status: DC

## 2020-09-23 MED FILL — FLUCONAZOLE 150 MG TABS: 150 | 7 days supply | Qty: 7 | Fill #0

## 2020-09-23 MED FILL — METRONIDAZOLE 500 MG TABS: 500 | 7 days supply | Qty: 14 | Fill #0

## 2020-09-23 NOTE — Progress Notes (Signed)
RFV: follow up for hiv disease  Patient ID: Rachel Underwood, female   DOB: 1971/04/28, 49 y.o.   MRN: [id number]  HPI Rachel Underwood is a 49yo F with HIV disease, bipolar disorder, she reports that she is doing okay still caring for her grandchildren but reports missing medications, she reports missing 3-4 doses per month, usually once per week.   Was moving around, displaced. Back to [place]  Unprotected sex, now having discharge.   Had only 1 dose of covid-19 vaccine (moderna) thus far. She was quite ill with side effects.   Outpatient Encounter Medications as of 09/23/2020  Medication Sig  . acyclovir (ZOVIRAX) 400 MG tablet Take 1 tablet (400 mg total) by mouth 2 (two) times daily as needed (for prophylaxis).  Marland Kitchen albuterol (VENTOLIN HFA) 108 (90 Base) MCG/ACT inhaler Inhale 2 puffs into the lungs every 4 (four) hours as needed for wheezing or shortness of breath. Reported on 04/06/2016  . bictegravir-emtricitabine-tenofovir AF (BIKTARVY) 50-200-25 MG TABS tablet Take 1 tablet by mouth daily.  . cycloSPORINE (RESTASIS) 0.05 % ophthalmic emulsion   . dexlansoprazole (DEXILANT) 60 MG capsule Take 1 capsule (60 mg total) by mouth daily. (Needs to be seen before next refill)  . lamoTRIgine (LAMICTAL) 200 MG tablet Take 1 tablet (200 mg total) by mouth daily.  Marland Kitchen linaclotide (LINZESS) 290 MCG CAPS capsule Take 1 capsule (290 mcg total) by mouth daily. On empty stomach  . nystatin (MYCOSTATIN/NYSTOP) powder Apply topically 3 (three) times daily. For up to 2 weeks for rash under breast  . ondansetron (ZOFRAN) 8 MG tablet Take 1 tablet (8 mg total) by mouth every 8 (eight) hours as needed for nausea or vomiting.  . furosemide (LASIX) 20 MG tablet Take 1 tablet (20 mg total) by mouth daily.  Marland Kitchen levocetirizine (XYZAL) 5 MG tablet Take 1 tablet (5 mg total) by mouth every evening.  . propranolol (INDERAL) 10 MG tablet Take 1 tablet (10 mg total) by mouth 2 (two) times daily. (Patient not taking: Reported on  07/13/2020)  . sertraline (ZOLOFT) 100 MG tablet Take 250 mg by mouth every morning. Takes 2 daily (Patient not taking: Reported on 07/13/2020)  . topiramate (TOPAMAX) 50 MG tablet Take 1 tablet (50 mg total) by mouth 2 (two) times daily.   No facility-administered encounter medications on file as of 09/23/2020.     Patient Active Problem List   Diagnosis Date Noted  . Bilateral carpal tunnel syndrome 02/17/2016  . Allergic rhinitis due to pollen 02/17/2016  . Headache 11/09/2015  . Chronic back pain 11/09/2015  . Diarrhea 05/19/2015  . Cysts of both ovaries 05/12/2015  . Abdominal wall hernia 02/03/2015  . Abdominal wall mass of periumbilical region 47/65/4650  . Schatzki's ring   . Diverticulosis of colon without hemorrhage   . Other hemorrhoids   . Rectal bleeding 12/11/2014  . GERD (gastroesophageal reflux disease) 12/11/2014  . Esophageal dysphagia 12/11/2014  . Constipation 12/11/2014  . S/P hysterectomy 09/01/2014  . Pelvic pain in female 07/22/2014  . Anovulation 06/18/2014  . DUB (dysfunctional uterine bleeding) 06/18/2014  . Menorrhagia with irregular cycle 06/03/2014  . Enlarged uterus 06/03/2014  . HTN (hypertension) 01/15/2014  . Human immunodeficiency virus (HIV) disease ([place]) 07/17/2013  . Schizophrenia ([place]) 07/17/2013  . Bipolar disorder, unspecified ([place]) 07/17/2013     Health Maintenance Due  Topic Date Due  . COVID-19 Vaccine (1) Never done  . INFLUENZA VACCINE  07/04/2020     Review of Systems  Constitutional: Negative for fever,  chills, diaphoresis, activity change, appetite change, fatigue and unexpected weight change.  HENT: Negative for congestion, sore throat, rhinorrhea, sneezing, trouble swallowing and sinus pressure.  Eyes: Negative for photophobia and visual disturbance.  Respiratory: Negative for cough, chest tightness, shortness of breath, wheezing and stridor.  Cardiovascular: Negative for chest pain, palpitations and leg swelling.    Gastrointestinal: Negative for nausea, vomiting, abdominal pain, diarrhea, constipation, blood in stool, abdominal distention and anal bleeding.  Genitourinary: Negative for dysuria, hematuria, flank pain and difficulty urinating.  Musculoskeletal: Negative for myalgias, back pain, joint swelling, arthralgias and gait problem.  Skin: Negative for color change, pallor, rash and wound.  Neurological: Negative for dizziness, tremors, weakness and light-headedness.  Hematological: Negative for adenopathy. Does not bruise/bleed easily.  Psychiatric/Behavioral: Negative for behavioral problems, confusion, sleep disturbance, dysphoric mood, decreased concentration and agitation.    Physical Exam   BP 134/78   Pulse 88   Ht 5\' 6"  (1.676 m)   Wt 238 lb (108 kg)   LMP 08/11/2014   SpO2 97%   BMI 38.41 kg/m   Physical Exam  Constitutional:  oriented to person, place, and time. appears well-developed and well-nourished. No distress.  HENT: [place]/AT, PERRLA, no scleral icterus Mouth/Throat: Oropharynx is clear and moist. No oropharyngeal exudate.  Cardiovascular: Normal rate, regular rhythm and normal heart sounds. Exam reveals no gallop and no friction rub.  No murmur heard.  Pulmonary/Chest: Effort normal and breath sounds normal. No respiratory distress.  has no wheezes.  Neck = supple, no nuchal rigidity Abdominal: Soft. Bowel sounds are normal.  exhibits no distension. There is no tenderness.  Lymphadenopathy: no cervical adenopathy. No axillary adenopathy Neurological: alert and oriented to person, place, and time.  Skin: Skin is warm and dry. No rash noted. No erythema.  Psychiatric: a normal mood and affect.  behavior is normal.   Lab Results  Component Value Date   CD4TCELL 34 03/17/2020   Lab Results  Component Value Date   CD4TABS 636 03/17/2020   CD4TABS 331 (L) 05/26/2019   CD4TABS 710 05/10/2016   Lab Results  Component Value Date   HIV1RNAQUANT <20 NOT DETECTED  05/10/2020   Lab Results  Component Value Date   HEPBSAB POS (A) 07/15/2013   Lab Results  Component Value Date   LABRPR NON-REACTIVE 03/17/2020    CBC Lab Results  Component Value Date   WBC 7.9 03/17/2020   RBC 4.59 03/17/2020   HGB 14.6 03/17/2020   HCT 43.0 03/17/2020   PLT 245 03/17/2020   MCV 93.7 03/17/2020   MCH 31.8 03/17/2020   MCHC 34.0 03/17/2020   RDW 12.2 03/17/2020   LYMPHSABS 1,386 05/26/2019   MONOABS 528 05/10/2016   EOSABS 17 05/26/2019    BMET Lab Results  Component Value Date   NA 138 03/17/2020   K 3.9 03/17/2020   CL 105 03/17/2020   CO2 27 03/17/2020   GLUCOSE 94 03/17/2020   BUN 10 03/17/2020   CREATININE 0.83 03/17/2020   CALCIUM 9.3 03/17/2020   GFRNONAA 81 05/26/2019   GFRAA 94 05/26/2019      Assessment and Plan  Check for discharge = will check for trich and sti  hiv disease = will get labs. cotninue on biktarvy to see if still undetectable  Discharge, concern for recurrent trich = for 7 days. Metronidazole 500mg  BID x 7d. And fluconazole 150mg   Health maintenance = will give flu vaccine today  Bipolar disorder= continue on current regimen  Mammo and pap due- will  schedule pap visit

## 2020-09-24 DIAGNOSIS — F603 Borderline personality disorder: Secondary | ICD-10-CM | POA: Diagnosis not present

## 2020-09-24 LAB — T-HELPER CELL (CD4) - (RCID CLINIC ONLY)
CD4 % Helper T Cell: 35 % (ref 33–65)
CD4 T Cell Abs: 801 /uL (ref 400–1790)

## 2020-09-24 LAB — URINE CYTOLOGY ANCILLARY ONLY
Chlamydia: NEGATIVE
Comment: NEGATIVE
Comment: NORMAL
Neisseria Gonorrhea: NEGATIVE

## 2020-09-26 LAB — CBC WITH DIFFERENTIAL/PLATELET
Absolute Monocytes: 720 cells/uL (ref 200–950)
Basophils Absolute: 32 cells/uL (ref 0–200)
Basophils Relative: 0.4 %
Eosinophils Absolute: 56 cells/uL (ref 15–500)
Eosinophils Relative: 0.7 %
HCT: 40.3 % (ref 35.0–45.0)
Hemoglobin: 13.7 g/dL (ref 11.7–15.5)
Lymphs Abs: 2192 cells/uL (ref 850–3900)
MCH: 31.7 pg (ref 27.0–33.0)
MCHC: 34 g/dL (ref 32.0–36.0)
MCV: 93.3 fL (ref 80.0–100.0)
MPV: 9.9 fL (ref 7.5–12.5)
Monocytes Relative: 9 %
Neutro Abs: 5000 cells/uL (ref 1500–7800)
Neutrophils Relative %: 62.5 %
Platelets: 212 10*3/uL (ref 140–400)
RBC: 4.32 10*6/uL (ref 3.80–5.10)
RDW: 12.3 % (ref 11.0–15.0)
Total Lymphocyte: 27.4 %
WBC: 8 10*3/uL (ref 3.8–10.8)

## 2020-09-26 LAB — URINALYSIS, ROUTINE W REFLEX MICROSCOPIC
Bilirubin Urine: NEGATIVE
Glucose, UA: NEGATIVE
Hgb urine dipstick: NEGATIVE
Hyaline Cast: NONE SEEN /LPF
Nitrite: NEGATIVE
Protein, ur: NEGATIVE
Specific Gravity, Urine: 1.034 (ref 1.001–1.03)
pH: <=5 (ref 5.0–8.0)

## 2020-09-26 LAB — COMPLETE METABOLIC PANEL WITH GFR
AG Ratio: 1.8 (calc) (ref 1.0–2.5)
ALT: 12 U/L (ref 6–29)
AST: 16 U/L (ref 10–35)
Albumin: 4.3 g/dL (ref 3.6–5.1)
Alkaline phosphatase (APISO): 56 U/L (ref 31–125)
BUN: 14 mg/dL (ref 7–25)
CO2: 29 mmol/L (ref 20–32)
Calcium: 9.7 mg/dL (ref 8.6–10.2)
Chloride: 105 mmol/L (ref 98–110)
Creat: 0.9 mg/dL (ref 0.50–1.10)
GFR, Est African American: 88 mL/min/{1.73_m2} (ref >=60)
GFR, Est Non African American: 76 mL/min/{1.73_m2} (ref >=60)
Globulin: 2.4 g/dL (calc) (ref 1.9–3.7)
Glucose, Bld: 98 mg/dL (ref 65–99)
Potassium: 4.9 mmol/L (ref 3.5–5.3)
Sodium: 139 mmol/L (ref 135–146)
Total Bilirubin: 0.3 mg/dL (ref 0.2–1.2)
Total Protein: 6.7 g/dL (ref 6.1–8.1)

## 2020-09-26 LAB — WET PREP FOR TRICH, YEAST, CLUE

## 2020-09-26 LAB — HIV-1 RNA QUANT-NO REFLEX-BLD
HIV 1 RNA Quant: <20 Copies/mL
HIV-1 RNA Quant, Log: <1.3 Log cps/mL

## 2020-09-26 LAB — RPR: RPR Ser Ql: NONREACTIVE

## 2020-09-29 ENCOUNTER — Telehealth: Payer: Self-pay

## 2020-09-29 NOTE — Telephone Encounter (Signed)
Attempted to call patient to give her the number to schedule a mammogram per Dr. Storm Frisk recommendation. No answer, unable to leave message. Sent patient MyChart message with instructions and phone number to schedule a mammogram with [facility].   Beryle Flock, RN

## 2020-09-29 NOTE — Telephone Encounter (Signed)
Called patient and left her a voicemail to call back. Per Dr. Baxter Flattery she would like to see if patient is willing to come in sooner to do her pap with [facility] and repeat her wet prep on the same day as well. Wet prep was not collected with the correct swab. It will need to be the red or blue swab for [facility].  Rachel Underwood T Brooks Sailors

## 2020-09-30 NOTE — Telephone Encounter (Signed)
Patient scheduled for pap smear 11/17 @ 4:15 with [facility] [place]. Transportation to be arranged.   Iban Utz Lorita Officer, RN

## 2020-10-11 ENCOUNTER — Telehealth: Payer: Self-pay | Admitting: Family Medicine

## 2020-10-12 ENCOUNTER — Other Ambulatory Visit: Payer: Self-pay

## 2020-10-12 ENCOUNTER — Telehealth: Payer: Self-pay

## 2020-10-12 ENCOUNTER — Other Ambulatory Visit: Payer: Self-pay | Admitting: Family Medicine

## 2020-10-12 ENCOUNTER — Other Ambulatory Visit (HOSPITAL_COMMUNITY)
Admission: RE | Admit: 2020-10-12 | Discharge: 2020-10-12 | Disposition: A | Discharge status: Home / Self Care | Payer: Medicaid Other | Source: Ambulatory Visit | Attending: Family Medicine | Admitting: Family Medicine

## 2020-10-12 ENCOUNTER — Ambulatory Visit (INDEPENDENT_AMBULATORY_CARE_PROVIDER_SITE_OTHER): Payer: Medicaid Other | Admitting: Family Medicine

## 2020-10-12 VITALS — BP 114/62 | HR 85 | Temp 97.7°F | Ht 66.0 in | Wt 263.6 lb

## 2020-10-12 DIAGNOSIS — K2100 Gastro-esophageal reflux disease with esophagitis, without bleeding: Secondary | ICD-10-CM

## 2020-10-12 DIAGNOSIS — N898 Other specified noninflammatory disorders of vagina: Secondary | ICD-10-CM

## 2020-10-12 DIAGNOSIS — Z8619 Personal history of other infectious and parasitic diseases: Secondary | ICD-10-CM | POA: Insufficient documentation

## 2020-10-12 DIAGNOSIS — B20 Human immunodeficiency virus [HIV] disease: Secondary | ICD-10-CM

## 2020-10-12 DIAGNOSIS — R768 Other specified abnormal immunological findings in serum: Secondary | ICD-10-CM | POA: Diagnosis not present

## 2020-10-12 DIAGNOSIS — Z87892 Personal history of anaphylaxis: Secondary | ICD-10-CM | POA: Diagnosis not present

## 2020-10-12 DIAGNOSIS — Z124 Encounter for screening for malignant neoplasm of cervix: Secondary | ICD-10-CM | POA: Insufficient documentation

## 2020-10-12 DIAGNOSIS — Z8269 Family history of other diseases of the musculoskeletal system and connective tissue: Secondary | ICD-10-CM | POA: Diagnosis not present

## 2020-10-12 LAB — WET PREP FOR TRICH, YEAST, CLUE
Clue Cell Exam: NEGATIVE
Trichomonas Exam: NEGATIVE
Yeast Exam: NEGATIVE

## 2020-10-12 MED ORDER — ONDANSETRON 8 MG PO TBDP: 8.0000 mg | ORAL_TABLET | Freq: Three times a day (TID) | ORAL | 2 refills | Status: DC | PRN

## 2020-10-12 MED ORDER — MONTELUKAST SODIUM 10 MG PO TABS: 10.0000 mg | ORAL_TABLET | Freq: Every day | ORAL | 3 refills | Status: DC

## 2020-10-12 MED ORDER — AZELASTINE HCL 0.1 % NA SOLN
1.0000 | Freq: Two times a day (BID) | NASAL | 12 refills | Status: DC
Start: 2020-10-12 — End: 2024-07-24

## 2020-10-12 MED ORDER — DEXILANT 60 MG PO CPDR: 1.0000 | DELAYED_RELEASE_CAPSULE | Freq: Every day | ORAL | 3 refills | Status: DC

## 2020-10-12 MED ORDER — EPINEPHRINE 0.3 MG/0.3ML IJ SOAJ: 0.3000 mg | INTRAMUSCULAR | 0 refills | Status: DC | PRN

## 2020-10-12 MED FILL — AZELASTINE HCL 137 MCG/SPRA: 137 | 30 days supply | Qty: 30 | Fill #0

## 2020-10-12 MED FILL — MONTELUKAST SOD 10 MG TAB: 10 | 30 days supply | Qty: 30 | Fill #0

## 2020-10-12 MED FILL — ONDANSETRON ODT 8 MG TABLET: 8 | 10 days supply | Qty: 30 | Fill #0

## 2020-10-12 MED FILL — DEXILANT DR 60 MG CAPSULE: 60 | 90 days supply | Qty: 90 | Fill #0

## 2020-10-12 MED FILL — EPINEPHRINE 0.3 MG AUTO-INJ: 0.3 | 30 days supply | Qty: 2 | Fill #0

## 2020-10-12 NOTE — Telephone Encounter (Signed)
Ok to provide, thought I am not sure this will be a valid reason since the rec center, park are available for exercising.

## 2020-10-12 NOTE — Telephone Encounter (Signed)
Letter sent to patient's MyChart 

## 2020-10-12 NOTE — Patient Instructions (Signed)

## 2020-10-12 NOTE — Progress Notes (Signed)
Subjective: CC: Follow-up GERD PCP: Janora Norlander, DO ONG:EXBMW Boule is a 49 y.o. female presenting to clinic today for:  1.  GERD Patient reports that Dexilant has helped with GERD.  No hematochezia, melena, vomiting.  She does report intermittent nausea that seems to be related to her medications.  She has as needed Zofran for this but needs a refill.  This was previously prescribed by her infectious disease doctor  2.  Vaginal discharge Patient reports a vaginal discharge has been ongoing for the last couple of days.  She is status post treatment with Flagyl for vaginal infection.  She has a couple of weeks of Diflucan left over as well that she is to continue taking each week as per ID.  Her infectious disease doctor wanted her to have a repeat Pap smear given HIV positive status.  She is compliant with her [place] [facility] and has a follow-up visit with her ID doctor in a few weeks.  Medical history is significant for history of vaginal cancer and she is now status post abdominal hysterectomy with Dr. Glo Herring in 2015.  This was ovary sparing as there were ovarian cysts noted in her 2016 imaging studies.   3.  Family history of lupus Patient recently had labs done that showed ANA positivity.  She was referred to rheumatology given family history of lupus but she never followed through.  She is asking for a new referral   ROS: Per HPI  Allergies  Allergen Reactions  . Flecainide Swelling  . Naproxen Anaphylaxis and Hives  . Piroxicam Anaphylaxis  . Abilify [Aripiprazole]   . Citalopram Hives  . Codeine Nausea And Vomiting  . Depakote [Divalproex Sodium] Swelling  . Gabapentin     Hives   . Haldol [Haloperidol Lactate] Other (See Comments)    Slurs speech and "stumble"  . Indomethacin Swelling    Dizzy, tigling of face  . Invega [Paliperidone] Other (See Comments)    Slurred speech, drooling  . Latuda [Lurasidone Hcl] Itching  . Lithium Hives  . Promethazine Swelling     Arms and hands  . Trazodone And Nefazodone Other (See Comments)    Dry mouth, congestion, severe dryness.  [name] [Escitalopram Oxalate] Rash   Past Medical History:  Diagnosis Date  . Abnormal Pap smear of cervix 2005(EST)   ck[facility] for abnl pap- [facility] [place]  . Allergy   . Asthma   . Bipolar 1 disorder ([place])   . Borderline personality disorder ([place])   . BPD (bronchopulmonary dysplasia)   . Carpal tunnel syndrome    bilateral wrist  . Depression   . Dysmenorrhea 06/03/2014  . Enlarged uterus 06/03/2014  . Fibromyalgia   . GERD (gastroesophageal reflux disease)   . Herpes simplex without mention of complication   . HIV (human immunodeficiency virus infection) ([place])   . Hyperlipidemia   . Hypertension   . Menorrhagia with irregular cycle 06/03/2014  . Osteoarthritis   . Seizures ([place])    had as a teenager, unknown etiology and no meds.    Current Outpatient Medications:  .  acyclovir (ZOVIRAX) 400 MG tablet, Take 1 tablet (400 mg total) by mouth 2 (two) times daily as needed (for prophylaxis)., Disp: 60 tablet, Rfl: 5 .  albuterol (VENTOLIN HFA) 108 (90 Base) MCG/ACT inhaler, Inhale 2 puffs into the lungs every 4 (four) hours as needed for wheezing or shortness of breath. Reported on 04/06/2016, Disp: 18 g, Rfl: 5 .  bictegravir-emtricitabine-tenofovir AF (BIKTARVY)  50-200-25 MG TABS tablet, Take 1 tablet by mouth daily., Disp: 30 tablet, Rfl: 5 .  cycloSPORINE (RESTASIS) 0.05 % ophthalmic emulsion, , Disp: , Rfl:  .  dexlansoprazole (DEXILANT) 60 MG capsule, Take 1 capsule (60 mg total) by mouth daily. (Needs to be seen before next refill), Disp: 30 capsule, Rfl: 0 .  fluconazole (DIFLUCAN) 150 MG tablet, Take 1 tablet (150 mg total) by mouth daily., Disp: 7 tablet, Rfl: 0 .  furosemide (LASIX) 20 MG tablet, Take 1 tablet (20 mg total) by mouth daily., Disp: 30 tablet, Rfl: 3 .  lamoTRIgine (LAMICTAL) 200 MG tablet, Take 1 tablet (200 mg total) by mouth daily., Disp: 30  tablet, Rfl: 3 .  levocetirizine (XYZAL) 5 MG tablet, Take 1 tablet (5 mg total) by mouth every evening., Disp: 30 tablet, Rfl: 3 .  linaclotide (LINZESS) 290 MCG CAPS capsule, Take 1 capsule (290 mcg total) by mouth daily. On empty stomach, Disp: 30 capsule, Rfl: 2 .  metroNIDAZOLE (FLAGYL) 500 MG tablet, Take 1 tablet (500 mg total) by mouth 2 (two) times daily., Disp: 14 tablet, Rfl: 0 .  nystatin (MYCOSTATIN/NYSTOP) powder, Apply topically 3 (three) times daily. For up to 2 weeks for rash under breast, Disp: 30 g, Rfl: 1 .  ondansetron (ZOFRAN) 8 MG tablet, Take 1 tablet (8 mg total) by mouth every 8 (eight) hours as needed for nausea or vomiting., Disp: 60 tablet, Rfl: 2 .  propranolol (INDERAL) 10 MG tablet, Take 1 tablet (10 mg total) by mouth 2 (two) times daily. (Patient not taking: Reported on 07/13/2020), Disp: 60 tablet, Rfl: 3 .  sertraline (ZOLOFT) 100 MG tablet, Take 250 mg by mouth every morning. Takes 2 daily (Patient not taking: Reported on 07/13/2020), Disp: , Rfl:  .  topiramate (TOPAMAX) 50 MG tablet, Take 1 tablet (50 mg total) by mouth 2 (two) times daily., Disp: 60 tablet, Rfl: 3 Social History   Socioeconomic History  . Marital status: Divorced    Spouse name: Not on file  . Number of children: 3  . Years of education: Not on file  . Highest education level: Not on file  Occupational History  . Not on file  Tobacco Use  . Smoking status: Current Every Day Smoker    Packs/day: 0.50    Years: 30.00    Pack years: 15.00    Types: Cigarettes    Start date: 12/04/1982  . Smokeless tobacco: Never Used  Substance and Sexual Activity  . Alcohol use: No    Alcohol/week: 0.0 standard drinks  . 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 Sexual activity: Yes    Partners: Male    Birth control/protection: Condom  Other Topics Concern  . Not on file  Social History Narrative   . Not on file   Social Determinants of Health   Financial Resource Strain:   . Difficulty of Paying Living Expenses: Not on file  Food Insecurity:   . Worried About Charity fundraiser in the Last Year: Not on file  . Ran Out of Food in the Last Year: Not on file  Transportation Needs:   . Lack of Transportation (Medical): Not on file  . Lack of Transportation (Non-Medical): Not on file  Physical Activity:   . Days of Exercise per Week: Not on file  . Minutes of Exercise  per Session: Not on file  Stress:   . Feeling of Stress : Not on file  Social Connections:   . Frequency of Communication with Friends and Family: Not on file  . Frequency of Social Gatherings with Friends and Family: Not on file  . Attends Religious Services: Not on file  . Active Member of Clubs or Organizations: Not on file  . Attends Archivist Meetings: Not on file  . Marital Status: Not on file  Intimate Partner Violence:   . Fear of Current or Ex-Partner: Not on file  . Emotionally Abused: Not on file  . Physically Abused: Not on file  . Sexually Abused: Not on file   Family History  Problem Relation Age of Onset  . Fibroids Mother   . Hypertension Mother   . Depression Mother   . Irritable bowel syndrome Mother   . Arthritis Mother   . Heart disease Mother   . Mental illness Mother   . Stroke Mother   . Vision loss Mother   . Hypertension Father   . Mental illness Father   . Arthritis Father   . Heart disease Father   . Vision loss Father   . Heart disease Maternal Grandmother   . Cancer Maternal Grandmother        breast  . Depression Maternal Grandmother   . Hyperlipidemia Maternal Grandmother   . Hypertension Maternal Grandmother   . Bipolar disorder Daughter   . Schizophrenia Daughter   . Cancer Paternal Grandfather        prostate  . Heart disease Paternal Grandfather   . Hyperlipidemia Paternal Grandfather   . Hypertension Paternal Grandfather   . Dementia Paternal  Grandmother   . Hyperlipidemia Paternal Grandmother   . Hypertension Paternal Grandmother   . Heart disease Maternal Grandfather   . Alzheimer's disease Maternal Grandfather   . Hyperlipidemia Maternal Grandfather   . Hypertension Maternal Grandfather   . Anxiety disorder Daughter   . ADD / ADHD Son   . Hypertension Brother   . Early death Brother   . Learning disabilities Brother   . Mental illness Brother   . Colon cancer Neg Hx     Objective: Office vital signs reviewed. BP 114/62   Pulse 85   Temp 97.7 F (36.5 C)   Ht 5\' 6"  (1.676 m)   Wt 263 lb 9.6 oz (119.6 kg)   LMP 08/11/2014   SpO2 97%   BMI 42.55 kg/m   Physical Examination:  General: Awake, alert, obese, No acute distress HEENT: Normal, sclera white.  Moist mucous membranes Cardio: regular rate and rhythm, S1S2 heard, no murmurs appreciated Pulm: clear to auscultation bilaterally, no wheezes, rhonchi or rales; normal work of breathing on room air GU: external vaginal tissue normal, cervix surgically absent, minimal white, cheesy discharge noted within the vaginal vault.  Assessment/ Plan: 49 y.o. female   1. Human immunodeficiency virus (HIV) disease ([place]) Pap smear obtained.  Of note her cervix is surgically absent.  I cannot find evidence of previous vaginal cancer in the notes.  I reviewed her chart with her infectious disease doctor and they did ask for yearly Pap smears - Cytology - PAP  2. History of trichomonal vaginitis Check Pap, wet prep - Cytology - PAP  3. Screening for cervical cancer - Cytology - PAP  4. Vaginal discharge Clinically consistent with yeast vaginitis. - WET PREP FOR [place], YEAST, CLUE  5. Gastroesophageal reflux disease with esophagitis without hemorrhage Controlled. Renewed -  dexlansoprazole (DEXILANT) 60 MG capsule; Take 1 capsule (60 mg total) by mouth daily.  Dispense: 90 capsule; Refill: 3  6. Positive ANA (antinuclear antibody) New referral to rheumatology  placed.  She also requested pain management referral but would like to hold off on this until she has eval by rheumatology as she may not need this extra referral- Ambulatory referral to Rheumatology  7. Family history of systemic lupus erythematosus *- Ambulatory referral to Rheumatology  8. History of anaphylaxis Refill of EpiPen sent - EPINEPHrine 0.3 mg/0.3 mL IJ SOAJ injection; Inject 0.3 mg into the muscle as needed for anaphylaxis (then call 911).  Dispense: 1 each; Refill: 0   No orders of the defined types were placed in this encounter.  No orders of the defined types were placed in this encounter.    Janora Norlander, DO [place] [phone]

## 2020-10-13 MED FILL — BIKTARVY 50-200-25 MG TABS: 50-200-25 | 30 days supply | Qty: 30 | Fill #5

## 2020-10-14 ENCOUNTER — Other Ambulatory Visit (HOSPITAL_COMMUNITY): Payer: Self-pay | Admitting: Optometry

## 2020-10-15 ENCOUNTER — Other Ambulatory Visit: Payer: Self-pay | Admitting: Family Medicine

## 2020-10-15 DIAGNOSIS — Z8269 Family history of other diseases of the musculoskeletal system and connective tissue: Secondary | ICD-10-CM

## 2020-10-15 DIAGNOSIS — R768 Other specified abnormal immunological findings in serum: Secondary | ICD-10-CM

## 2020-10-15 LAB — CYTOLOGY - PAP
Chlamydia: NEGATIVE
Comment: NEGATIVE
Comment: NEGATIVE
Comment: NEGATIVE
Comment: NORMAL
Diagnosis: NEGATIVE
High risk HPV: NEGATIVE
Neisseria Gonorrhea: NEGATIVE
Trichomonas: NEGATIVE

## 2020-10-15 MED FILL — CROMOLYN 4% EYE DROPS: 4 | 30 days supply | Qty: 10 | Fill #0

## 2020-10-15 MED FILL — RESTASIS 0.05% EYE EMULSION: 0.05 | 90 days supply | Qty: 180 | Fill #0

## 2020-10-20 ENCOUNTER — Ambulatory Visit: Payer: Medicaid Other | Admitting: Infectious Diseases

## 2020-11-02 ENCOUNTER — Other Ambulatory Visit: Payer: Self-pay | Admitting: Family Medicine

## 2020-11-02 DIAGNOSIS — R768 Other specified abnormal immunological findings in serum: Secondary | ICD-10-CM

## 2020-11-02 DIAGNOSIS — Z8269 Family history of other diseases of the musculoskeletal system and connective tissue: Secondary | ICD-10-CM

## 2020-11-02 NOTE — Progress Notes (Signed)
Patient aware and verbalized understanding. °

## 2020-11-07 ENCOUNTER — Encounter (INDEPENDENT_AMBULATORY_CARE_PROVIDER_SITE_OTHER): Payer: Self-pay

## 2020-11-08 ENCOUNTER — Other Ambulatory Visit: Payer: Self-pay

## 2020-11-08 ENCOUNTER — Other Ambulatory Visit: Payer: Medicaid Other

## 2020-11-08 DIAGNOSIS — R768 Other specified abnormal immunological findings in serum: Secondary | ICD-10-CM | POA: Diagnosis not present

## 2020-11-08 DIAGNOSIS — Z8269 Family history of other diseases of the musculoskeletal system and connective tissue: Secondary | ICD-10-CM

## 2020-11-09 ENCOUNTER — Encounter (INDEPENDENT_AMBULATORY_CARE_PROVIDER_SITE_OTHER): Payer: Self-pay | Admitting: *Deleted

## 2020-11-09 VITALS — BP 174/111 | HR 69 | Temp 97.9°F | Wt 265.2 lb

## 2020-11-09 DIAGNOSIS — Z006 Encounter for examination for normal comparison and control in clinical research program: Secondary | ICD-10-CM

## 2020-11-09 DIAGNOSIS — I10 Essential (primary) hypertension: Secondary | ICD-10-CM

## 2020-11-09 NOTE — Patient Instructions (Addendum)
Great meeting you today Monitor blood pressure goal <140/90 Re-check today if remains elevated go to urgent care If you develop cardiac symptoms we discussed call 911 without delay Continue current research regimen Avoid mucinex dm and NSAIDS, smoking, high sodium diet   Hypertension, Adult High blood pressure (hypertension) is when the force of blood pumping through the arteries is too strong. The arteries are the blood vessels that carry blood from the heart throughout the body. Hypertension forces the heart to work harder to pump blood and may cause arteries to become narrow or stiff. Untreated or uncontrolled hypertension can cause a heart attack, heart failure, a stroke, kidney disease, and other problems. A blood pressure reading consists of a higher number over a lower number. Ideally, your blood pressure should be below 120/80. The first ("top") number is called the systolic pressure. It is a measure of the pressure in your arteries as your heart beats. The second ("bottom") number is called the diastolic pressure. It is a measure of the pressure in your arteries as the heart relaxes. What are the causes? The exact cause of this condition is not known. There are some conditions that result in or are related to high blood pressure. What increases the risk? Some risk factors for high blood pressure are under your control. The following factors may make you more likely to develop this condition:  Smoking.  Having type 2 diabetes mellitus, high cholesterol, or both.  Not getting enough exercise or physical activity.  Being overweight.  Having too much fat, sugar, calories, or salt (sodium) in your diet.  Drinking too much alcohol. Some risk factors for high blood pressure may be difficult or impossible to change. Some of these factors include:  Having chronic kidney disease.  Having a family history of high blood pressure.  Age. Risk increases with age.  Race. You may be at  higher risk if you are African American.  Gender. Men are at higher risk than women before age 64. After age 25, women are at higher risk than men.  Having obstructive sleep apnea.  Stress. What are the signs or symptoms? High blood pressure may not cause symptoms. Very high blood pressure (hypertensive crisis) may cause:  Headache.  Anxiety.  Shortness of breath.  Nosebleed.  Nausea and vomiting.  Vision changes.  Severe chest pain.  Seizures. How is this diagnosed? This condition is diagnosed by measuring your blood pressure while you are seated, with your arm resting on a flat surface, your legs uncrossed, and your feet flat on the floor. The cuff of the blood pressure monitor will be placed directly against the skin of your upper arm at the level of your heart. It should be measured at least twice using the same arm. Certain conditions can cause a difference in blood pressure between your right and left arms. Certain factors can cause blood pressure readings to be lower or higher than normal for a short period of time:  When your blood pressure is higher when you are in a health care provider's office than when you are at home, this is called white coat hypertension. Most people with this condition do not need medicines.  When your blood pressure is higher at home than when you are in a health care provider's office, this is called masked hypertension. Most people with this condition may need medicines to control blood pressure. If you have a high blood pressure reading during one visit or you have normal blood pressure with other  risk factors, you may be asked to:  Return on a different day to have your blood pressure checked again.  Monitor your blood pressure at home for 1 week or longer. If you are diagnosed with hypertension, you may have other blood or imaging tests to help your health care provider understand your overall risk for other conditions. How is this  treated? This condition is treated by making healthy lifestyle changes, such as eating healthy foods, exercising more, and reducing your alcohol intake. Your health care provider may prescribe medicine if lifestyle changes are not enough to get your blood pressure under control, and if:  Your systolic blood pressure is above 130.  Your diastolic blood pressure is above 80. Your personal target blood pressure may vary depending on your medical conditions, your age, and other factors. Follow these instructions at home: Eating and drinking   Eat a diet that is high in fiber and potassium, and low in sodium, added sugar, and fat. An example eating plan is called the DASH (Dietary Approaches to Stop Hypertension) diet. To eat this way: ? Eat plenty of fresh fruits and vegetables. Try to fill one half of your plate at each meal with fruits and vegetables. ? Eat whole grains, such as whole-wheat pasta, brown rice, or whole-grain bread. Fill about one fourth of your plate with whole grains. ? Eat or drink low-fat dairy products, such as skim milk or low-fat yogurt. ? Avoid fatty cuts of meat, processed or cured meats, and poultry with skin. Fill about one fourth of your plate with lean proteins, such as fish, chicken without skin, beans, eggs, or tofu. ? Avoid pre-made and processed foods. These tend to be higher in sodium, added sugar, and fat.  Reduce your daily sodium intake. Most people with hypertension should eat less than 1,500 mg of sodium a day.  Do not drink alcohol if: ? Your health care provider tells you not to drink. ? You are pregnant, may be pregnant, or are planning to become pregnant.  If you drink alcohol: ? Limit how much you use to:  0-1 drink a day for women.  0-2 drinks a day for men. ? Be aware of how much alcohol is in your drink. In the [place] one drink equals one 12 oz bottle of beer (355 mL), one 5 oz glass of wine (148 mL), or one 1 oz glass of hard liquor (44 mL).  Lifestyle   Work with your health care provider to maintain a healthy body weight or to lose weight. Ask what an ideal weight is for you.  Get at least 30 minutes of exercise most days of the week. Activities may include walking, swimming, or biking.  Include exercise to strengthen your muscles (resistance exercise), such as Pilates or lifting weights, as part of your weekly exercise routine. Try to do these types of exercises for 30 minutes at least 3 days a week.  Do not use any products that contain nicotine or tobacco, such as cigarettes, e-cigarettes, and chewing tobacco. If you need help quitting, ask your health care provider.  Monitor your blood pressure at home as told by your health care provider.  Keep all follow-up visits as told by your health care provider. This is important. Medicines  Take over-the-counter and prescription medicines only as told by your health care provider. Follow directions carefully. Blood pressure medicines must be taken as prescribed.  Do not skip doses of blood pressure medicine. Doing this puts you at risk for  problems and can make the medicine less effective.  Ask your health care provider about side effects or reactions to medicines that you should watch for. Contact a health care provider if you:  Think you are having a reaction to a medicine you are taking.  Have headaches that keep coming back (recurring).  Feel dizzy.  Have swelling in your ankles.  Have trouble with your vision. Get help right away if you:  Develop a severe headache or confusion.  Have unusual weakness or numbness.  Feel faint.  Have severe pain in your chest or abdomen.  Vomit repeatedly.  Have trouble breathing. Summary  Hypertension is when the force of blood pumping through your arteries is too strong. If this condition is not controlled, it may put you at risk for serious complications.  Your personal target blood pressure may vary depending on your  medical conditions, your age, and other factors. For most people, a normal blood pressure is less than 120/80.  Hypertension is treated with lifestyle changes, medicines, or a combination of both. Lifestyle changes include losing weight, eating a healthy, low-sodium diet, exercising more, and limiting alcohol. This information is not intended to replace advice given to you by your health care provider. Make sure you discuss any questions you have with your health care provider. Document Revised: 07/31/2018 Document Reviewed: 07/31/2018 Elsevier Patient Education  2020 [employer].

## 2020-11-09 NOTE — Progress Notes (Signed)
Subjective:    Patient ID: Rachel Underwood, female    DOB: 11-23-1971, 49 y.o.   MRN: [id number]  Chief Complaint  Patient presents with  . Research     HPI: Participant 59e is a 49 y.o. female  Seen today for research visit.  She has been adherent to pitavastatin/placebo 4mg . Her blood pressure was elevated today initially 174/111 and repeated after > 5 minutes of rest 170/117.  She has a history of palpitation and HTN.  Previously taking atenolol but not since 2017. BP has been well controlled at last 2 office visit with [facility] and PCP in November and October.   Today she admits to feeling down today because her brother's first grandchild is being born in [place] today.  It is bittersweet since she lost her brother in 2013.  She continues to grieve his death.  She is also currently living in [place] and is unhappy about the circumstances of her daughter becoming pregnant and having to help care for her children. She is able to manage transportation from family and friends. She has food and housing.   She has ongoing seasonal allergies and asthma for which she has been taking albuterol and mucinex DM.  Last use of inhaler was 4-5 days ago. Mucinex was today along with cup of coffee.  She is smoking 1/2 ppd and is actively trying to quit.  Plans to acquire Chantix from PCP. She does not exercise or follow low sodium diet. She was seen by cardiology last in 2019 for palpitation and has a loop recorder implanted. Family hx is significant for stroke with mother in 37s.   She denies chest pain, sob, n/v/d/c, dizziness, weakness, abd pain, HA, blurry vision, floaters.   Allergies  Allergen Reactions  . Flecainide Swelling  . Naproxen Anaphylaxis and Hives  . Piroxicam Anaphylaxis  . Abilify [Aripiprazole]   . Citalopram Hives  . Codeine Nausea And Vomiting  . Depakote [Divalproex Sodium] Swelling  . Gabapentin     Hives   . Haldol [Haloperidol Lactate] Other (See Comments)    Slurs speech  and "stumble"  . Indomethacin Swelling    Dizzy, tigling of face  . Invega [Paliperidone] Other (See Comments)    Slurred speech, drooling  . Latuda [Lurasidone Hcl] Itching  . Lithium Hives  . Promethazine Swelling    Arms and hands  . Trazodone And Nefazodone Other (See Comments)    Dry mouth, congestion, severe dryness.  [name] [Escitalopram Oxalate] Rash      Outpatient Medications Prior to Visit  Medication Sig Dispense Refill  . acyclovir (ZOVIRAX) 400 MG tablet Take 1 tablet (400 mg total) by mouth 2 (two) times daily as needed (for prophylaxis). 60 tablet 5  . albuterol (VENTOLIN HFA) 108 (90 Base) MCG/ACT inhaler Inhale 2 puffs into the lungs every 4 (four) hours as needed for wheezing or shortness of breath. Reported on 04/06/2016 18 g 5  . azelastine (ASTELIN) 0.1 % nasal spray Place 1 spray into both nostrils 2 (two) times daily. 30 mL 12  . bictegravir-emtricitabine-tenofovir AF (BIKTARVY) 50-200-25 MG TABS tablet Take 1 tablet by mouth daily. 30 tablet 5  . cycloSPORINE (RESTASIS) 0.05 % ophthalmic emulsion     . dexlansoprazole (DEXILANT) 60 MG capsule Take 1 capsule (60 mg total) by mouth daily. 90 capsule 3  . EPINEPHrine 0.3 mg/0.3 mL IJ SOAJ injection Inject 0.3 mg into the muscle as needed for anaphylaxis (then call 911). 1 each 0  . fluconazole (  DIFLUCAN) 150 MG tablet Take 1 tablet (150 mg total) by mouth daily. 7 tablet 0  . lamoTRIgine (LAMICTAL) 200 MG tablet Take 1 tablet (200 mg total) by mouth daily. 30 tablet 3  . linaclotide (LINZESS) 290 MCG CAPS capsule Take 1 capsule (290 mcg total) by mouth daily. On empty stomach 30 capsule 2  . montelukast (SINGULAIR) 10 MG tablet Take 1 tablet (10 mg total) by mouth at bedtime. 30 tablet 3  . nystatin (MYCOSTATIN/NYSTOP) powder Apply topically 3 (three) times daily. For up to 2 weeks for rash under breast 30 g 1  . ondansetron (ZOFRAN ODT) 8 MG disintegrating tablet Take 1 tablet (8 mg total) by mouth every 8  (eight) hours as needed for nausea or vomiting. 30 tablet 2   No facility-administered medications prior to visit.     Past Medical History:  Diagnosis Date  . Abnormal Pap smear of cervix 2005(EST)   ckc for abnl pap- [facility] [place]  . Allergy   . Asthma   . Bipolar 1 disorder ([place])   . Borderline personality disorder ([place])   . BPD (bronchopulmonary dysplasia)   . Carpal tunnel syndrome    bilateral wrist  . Depression   . Dysmenorrhea 06/03/2014  . Enlarged uterus 06/03/2014  . Fibromyalgia   . GERD (gastroesophageal reflux disease)   . Herpes simplex without mention of complication   . HIV (human immunodeficiency virus infection) ([place])   . Hyperlipidemia   . Hypertension   . Menorrhagia with irregular cycle 06/03/2014  . Osteoarthritis   . Seizures ([place])    had as a teenager, unknown etiology and no meds.     Past Surgical History:  Procedure Laterality Date  . ABDOMINAL HYSTERECTOMY N/A 09/01/2014   Procedure: HYSTERECTOMY ABDOMINAL;  Surgeon: Jonnie Kind, MD;  Location: [facility];  Service: Gynecology;  Laterality: N/A;  . ANKLE SURGERY Right    repair of ligaments  . BACK SURGERY    . BILATERAL SALPINGECTOMY Bilateral 09/01/2014   Procedure: BILATERAL SALPINGECTOMY;  Surgeon: Jonnie Kind, MD;  Location: [facility];  Service: Gynecology;  Laterality: Bilateral;  . CESAREAN SECTION    . ckc    . COLONOSCOPY N/A 12/23/2014   HOZ:YYQM canal hemorrhoids left-sided diverticula  . ESOPHAGOGASTRODUODENOSCOPY N/A 12/23/2014   GNO:IBBCWUG reflux/noncritical shatzkis ring s/p dilation/small HH  . EXCISION OF SKIN TAG N/A 09/01/2014   Procedure: EXCISION OF SKIN NEVUS;  Surgeon: Jonnie Kind, MD;  Location: [facility];  Service: Gynecology;  Laterality: N/A;  . FRACTURE SURGERY    . MALONEY DILATION N/A 12/23/2014   Procedure: Venia Minks DILATION;  Surgeon: Daneil Dolin, MD;  Location: [facility];  Service: Endoscopy;  Laterality: N/A;  . SAVORY DILATION N/A  12/23/2014   Procedure: SAVORY DILATION;  Surgeon: Daneil Dolin, MD;  Location: [facility];  Service: Endoscopy;  Laterality: N/A;  . SCAR REVISION N/A 09/01/2014   Procedure: EXCISION OF CICATRIX;  Surgeon: Jonnie Kind, MD;  Location: [facility];  Service: Gynecology;  Laterality: N/A;  . SHOULDER SURGERY Right    rotator cuff  . SPINE SURGERY         Review of Systems  Constitutional: Negative for activity change, appetite change, diaphoresis, fatigue, fever and unexpected weight change.  HENT: Positive for postnasal drip. Negative for congestion and rhinorrhea.   Eyes: Negative for discharge.  Respiratory: Positive for cough (dry). Negative for chest tightness, shortness of breath and wheezing.   Cardiovascular:  Negative for chest pain, palpitations and leg swelling.  Gastrointestinal: Negative for constipation, diarrhea, nausea and vomiting.  Endocrine: Negative for cold intolerance, heat intolerance, polydipsia, polyphagia and polyuria.  Musculoskeletal: Negative for gait problem.  Skin: Negative for rash.  Allergic/Immunologic: Positive for environmental allergies.  Neurological: Negative for dizziness, syncope, weakness, light-headedness, numbness and headaches.  Hematological: Negative for adenopathy.  Psychiatric/Behavioral: Negative for behavioral problems, sleep disturbance and suicidal ideas. The patient is nervous/anxious.       Objective:    BP (!) 174/111   Pulse 69   Temp 97.9 F (36.6 C) (Oral)   Wt 265 lb 4 oz (120.3 kg)   LMP 08/11/2014   BMI 42.81 kg/m  Nursing note and vital signs reviewed.  Physical Exam Constitutional:      General: She is not in acute distress.    Appearance: Normal appearance. She is obese. She is not ill-appearing or diaphoretic.  HENT:     Head: Atraumatic.     Nose: Nose normal.     Mouth/Throat:     Mouth: Mucous membranes are moist.  Eyes:     Extraocular Movements: Extraocular movements intact.      Conjunctiva/sclera: Conjunctivae normal.     Pupils: Pupils are equal, round, and reactive to light.  Cardiovascular:     Rate and Rhythm: Normal rate and regular rhythm.     Pulses: Normal pulses.     Heart sounds: Normal heart sounds. No murmur heard.  No friction rub. No gallop.   Pulmonary:     Effort: Pulmonary effort is normal. No respiratory distress.     Breath sounds: Normal breath sounds. No wheezing, rhonchi or rales.  Chest:     Chest wall: No tenderness.  Abdominal:     General: Bowel sounds are normal.     Palpations: Abdomen is soft.  Musculoskeletal:     Right lower leg: No edema.     Left lower leg: No edema.  Lymphadenopathy:     Cervical: No cervical adenopathy.  Skin:    General: Skin is warm and dry.  Neurological:     Mental Status: She is alert and oriented to person, place, and time.  Psychiatric:     Comments: Tearful several times throughout visit while discussing current circumstances and death of brother, anxious and fidgeting      Depression screen [facility] 2/9 10/12/2020 09/23/2020 06/20/2019 07/21/2016 03/30/2016  Decreased Interest 1 0 0 2 1  Down, Depressed, Hopeless 1 1 0 2 1  PHQ - 2 Score 2 1 0 4 2  Altered sleeping 2 - 0 2 -  Tired, decreased energy 2 - 0 2 -  Change in appetite 1 - 0 2 -  Feeling bad or failure about yourself  0 - 0 1 -  Trouble concentrating 1 - 0 1 -  Moving slowly or fidgety/restless 0 - 0 - -  Suicidal thoughts 0 - 0 0 -  PHQ-9 Score 8 - 0 12 -  Difficult doing work/chores Not difficult at all - - Somewhat difficult -  Some recent data might be hidden       Assessment & Plan:  Personally called Dr. Lajuana Ripple office to schedule follow up appt today regarding elevated blood pressure-there were no appointments available Patient reassured me she would check blood pressure at [facility] and if remains elevated will go to urgent care today She is traveling and unable to return to our clinic for recheck tomorrow Offered to  start antihypertensive today  she would rather wait for PCP to prescribe.  Reviewed previous vital signs 10/12/2020 114/62 09/23/2020 134/78 without antihypertensives and current stressors may validate the stressors being a contributing factor to elevated readings.  Declined counseling Stable at discharge Continue pitavastatin/placebo 4mg  as directed Follow up as directed with research   Patient Active Problem List   Diagnosis Date Noted  . Bilateral carpal tunnel syndrome 02/17/2016  . Allergic rhinitis due to pollen 02/17/2016  . Headache 11/09/2015  . Chronic back pain 11/09/2015  . Diarrhea 05/19/2015  . Cysts of both ovaries 05/12/2015  . Abdominal wall hernia 02/03/2015  . Abdominal wall mass of periumbilical region 35/46/5681  . Schatzki's ring   . Diverticulosis of colon without hemorrhage   . Other hemorrhoids   . Rectal bleeding 12/11/2014  . GERD (gastroesophageal reflux disease) 12/11/2014  . Esophageal dysphagia 12/11/2014  . Constipation 12/11/2014  . S/P hysterectomy 09/01/2014  . Pelvic pain in female 07/22/2014  . Anovulation 06/18/2014  . DUB (dysfunctional uterine bleeding) 06/18/2014  . Menorrhagia with irregular cycle 06/03/2014  . Enlarged uterus 06/03/2014  . HTN (hypertension) 01/15/2014  . Human immunodeficiency virus (HIV) disease ([place]) 07/17/2013  . Schizophrenia ([place]) 07/17/2013  . Bipolar disorder, unspecified ([place]) 07/17/2013     Problem List Items Addressed This Visit    None    Visit Diagnoses    Uncontrolled hypertension    -  Primary   Examination of participant in clinical trial           I am having Rachel Underwood maintain her linaclotide, nystatin, albuterol, lamoTRIgine, Biktarvy, acyclovir, Restasis, fluconazole, ondansetron, Dexilant, azelastine, montelukast, and EPINEPHrine.   No orders of the defined types were placed in this encounter.    Follow-up: No follow-ups on file.

## 2020-11-10 ENCOUNTER — Emergency Department (HOSPITAL_COMMUNITY): Payer: Medicaid Other

## 2020-11-10 ENCOUNTER — Telehealth: Payer: Self-pay

## 2020-11-10 ENCOUNTER — Emergency Department (HOSPITAL_COMMUNITY)
Admission: EM | Admit: 2020-11-10 | Discharge: 2020-11-10 | Disposition: A | Discharge status: Home / Self Care | Payer: Medicaid Other | Attending: Emergency Medicine | Admitting: Emergency Medicine

## 2020-11-10 ENCOUNTER — Encounter (HOSPITAL_COMMUNITY): Payer: Self-pay | Admitting: Emergency Medicine

## 2020-11-10 ENCOUNTER — Other Ambulatory Visit: Payer: Self-pay

## 2020-11-10 DIAGNOSIS — J45909 Unspecified asthma, uncomplicated: Secondary | ICD-10-CM | POA: Diagnosis not present

## 2020-11-10 DIAGNOSIS — R519 Headache, unspecified: Secondary | ICD-10-CM | POA: Diagnosis not present

## 2020-11-10 DIAGNOSIS — Z21 Asymptomatic human immunodeficiency virus [HIV] infection status: Secondary | ICD-10-CM | POA: Insufficient documentation

## 2020-11-10 DIAGNOSIS — F17210 Nicotine dependence, cigarettes, uncomplicated: Secondary | ICD-10-CM | POA: Diagnosis not present

## 2020-11-10 DIAGNOSIS — R030 Elevated blood-pressure reading, without diagnosis of hypertension: Secondary | ICD-10-CM | POA: Diagnosis present

## 2020-11-10 DIAGNOSIS — I10 Essential (primary) hypertension: Secondary | ICD-10-CM | POA: Diagnosis not present

## 2020-11-10 DIAGNOSIS — R059 Cough, unspecified: Secondary | ICD-10-CM | POA: Diagnosis not present

## 2020-11-10 DIAGNOSIS — Z79899 Other long term (current) drug therapy: Secondary | ICD-10-CM | POA: Insufficient documentation

## 2020-11-10 DIAGNOSIS — R0602 Shortness of breath: Secondary | ICD-10-CM | POA: Diagnosis not present

## 2020-11-10 LAB — ANA+ENA+DNA/DS+SCL 70+SJOSSA/B
ANA Titer 1: NEGATIVE
ENA RNP Ab: <0.2 AI (ref 0.0–0.9)
ENA SM Ab Ser-aCnc: <0.2 AI (ref 0.0–0.9)
ENA SSA (RO) Ab: <0.2 AI (ref 0.0–0.9)
ENA SSB (LA) Ab: <0.2 AI (ref 0.0–0.9)
Scleroderma (Scl-70) (ENA) Antibody, IgG: <0.2 AI (ref 0.0–0.9)
dsDNA Ab: <1 IU/mL (ref 0–9)

## 2020-11-10 LAB — CBC
HCT: 40.3 % (ref 36.0–46.0)
Hemoglobin: 13.2 g/dL (ref 12.0–15.0)
MCH: 32.1 pg (ref 26.0–34.0)
MCHC: 32.8 g/dL (ref 30.0–36.0)
MCV: 98.1 fL (ref 80.0–100.0)
Platelets: 234 10*3/uL (ref 150–400)
RBC: 4.11 MIL/uL (ref 3.87–5.11)
RDW: 13.1 % (ref 11.5–15.5)
WBC: 8 10*3/uL (ref 4.0–10.5)
nRBC: 0 % (ref 0.0–0.2)

## 2020-11-10 LAB — URINALYSIS, ROUTINE W REFLEX MICROSCOPIC
Bilirubin Urine: NEGATIVE
Glucose, UA: NEGATIVE mg/dL
Hgb urine dipstick: NEGATIVE
Ketones, ur: NEGATIVE mg/dL
Leukocytes,Ua: NEGATIVE
Nitrite: NEGATIVE
Protein, ur: NEGATIVE mg/dL
Specific Gravity, Urine: 1.003 — ABNORMAL LOW (ref 1.005–1.030)
pH: 7 (ref 5.0–8.0)

## 2020-11-10 LAB — TROPONIN I (HIGH SENSITIVITY)
Troponin I (High Sensitivity): 4 ng/L (ref <18)
Troponin I (High Sensitivity): 4 ng/L (ref <18)

## 2020-11-10 LAB — ANA W/REFLEX IF POSITIVE
Anti JO-1: <0.2 AI (ref 0.0–0.9)
Anti Nuclear Antibody (ANA): POSITIVE — AB
Centromere Ab Screen: 1.5 AI — ABNORMAL HIGH (ref 0.0–0.9)
Chromatin Ab SerPl-aCnc: <0.2 AI (ref 0.0–0.9)

## 2020-11-10 LAB — BASIC METABOLIC PANEL
Anion gap: 6 (ref 5–15)
BUN: 10 mg/dL (ref 6–20)
CO2: 27 mmol/L (ref 22–32)
Calcium: 8.9 mg/dL (ref 8.9–10.3)
Chloride: 103 mmol/L (ref 98–111)
Creatinine, Ser: 0.64 mg/dL (ref 0.44–1.00)
GFR, Estimated: >60 mL/min (ref >60)
Glucose, Bld: 100 mg/dL — ABNORMAL HIGH (ref 70–99)
Potassium: 3.8 mmol/L (ref 3.5–5.1)
Sodium: 136 mmol/L (ref 135–145)

## 2020-11-10 LAB — C3 AND C4
Complement C3, Serum: 130 mg/dL (ref 82–167)
Complement C4, Serum: 27 mg/dL (ref 12–38)

## 2020-11-10 MED ORDER — ATENOLOL 25 MG PO TABS: 25.0000 mg | ORAL_TABLET | Freq: Every day | ORAL | 0 refills | Status: DC

## 2020-11-10 MED ORDER — ACETAMINOPHEN 500 MG PO TABS
1000.0000 mg | ORAL_TABLET | Freq: Once | ORAL | Status: AC
  Administered 2020-11-10: 1000 mg via ORAL
  Filled 2020-11-10: qty 2

## 2020-11-10 MED ORDER — ATENOLOL 25 MG PO TABS
25.0000 mg | ORAL_TABLET | Freq: Once | ORAL | Status: AC
  Administered 2020-11-10: 25 mg via ORAL
  Filled 2020-11-10: qty 1

## 2020-11-10 NOTE — Progress Notes (Signed)
I have not seen this patient before.  Please check if it sent a new referral.

## 2020-11-10 NOTE — ED Provider Notes (Signed)
[facility] Provider Note   CSN: [id number] Arrival date & time: 11/10/20  1059     History Chief Complaint  Patient presents with  . Hypertension    R[facility] is a 49 y.o. female with a history including asthma, bipolar disorder, GERD, treated HIV with last CD4 count of 801 in October, hyperlipidemia and hypertension but not currently on any antihypertensive medications presenting for evaluation of headache, vision changes and a documented high blood pressure reading of 174/111, repeated at a local pharmacy where she reports her systolic pressure was over 200 yesterday when she attended her research clinic appointment where she is a participant in a lipid study.  She used to be on blood pressure medications including HCTZ and atenolol, stating she last took these medications in 2017.  She reports being asymptomatic until this past week when she has developed headaches, has a current headache which she describes as a tight band around the entirety of her head.  She states her symptoms are different from typical migraines which are generally unilateral and located behind an eye.  She also reports blurred vision currently which does not occur with her migraine history.  She tried to get an appointment with cardiology here in [place], but needs a new referral for this.  She has not been able to get an urgent appointment with her PCP.  She denies chest pain, denies shortness of breath, does endorse episodic peripheral edema which is improved today.  She denies focal weakness, no nausea, vomiting, palpitations.  She also denies cough, fevers or chills.  She is currently in the process of being evaluated for possible lupus.  HPI     Past Medical History:  Diagnosis Date  . Abnormal Pap smear of cervix 2005(EST)   ckc for abnl pap- [facility] [place]  . Allergy   . Asthma   . Bipolar 1 disorder ([place])   . Borderline personality disorder ([place])   . BPD  (bronchopulmonary dysplasia)   . Carpal tunnel syndrome    bilateral wrist  . Depression   . Dysmenorrhea 06/03/2014  . Enlarged uterus 06/03/2014  . Fibromyalgia   . GERD (gastroesophageal reflux disease)   . Herpes simplex without mention of complication   . HIV (human immunodeficiency virus infection) ([place])   . Hyperlipidemia   . Hypertension   . Menorrhagia with irregular cycle 06/03/2014  . Osteoarthritis   . Seizures ([place])    had as a teenager, unknown etiology and no meds.    Patient Active Problem List   Diagnosis Date Noted  . Bilateral carpal tunnel syndrome 02/17/2016  . Allergic rhinitis due to pollen 02/17/2016  . Headache 11/09/2015  . Chronic back pain 11/09/2015  . Diarrhea 05/19/2015  . Cysts of both ovaries 05/12/2015  . Abdominal wall hernia 02/03/2015  . Abdominal wall mass of periumbilical region 46/56/8127  . Schatzki's ring   . Diverticulosis of colon without hemorrhage   . Other hemorrhoids   . Rectal bleeding 12/11/2014  . GERD (gastroesophageal reflux disease) 12/11/2014  . Esophageal dysphagia 12/11/2014  . Constipation 12/11/2014  . S/P hysterectomy 09/01/2014  . Pelvic pain in female 07/22/2014  . Anovulation 06/18/2014  . DUB (dysfunctional uterine bleeding) 06/18/2014  . Menorrhagia with irregular cycle 06/03/2014  . Enlarged uterus 06/03/2014  . HTN (hypertension) 01/15/2014  . Human immunodeficiency virus (HIV) disease ([place]) 07/17/2013  . Schizophrenia ([place]) 07/17/2013  . Bipolar disorder, unspecified ([place]) 07/17/2013    Past Surgical History:  Procedure Laterality Date  . ABDOMINAL HYSTERECTOMY N/A 09/01/2014   Procedure: HYSTERECTOMY ABDOMINAL;  Surgeon: Jonnie Kind, MD;  Location: [facility];  Service: Gynecology;  Laterality: N/A;  . ANKLE SURGERY Right    repair of ligaments  . BACK SURGERY    . BILATERAL SALPINGECTOMY Bilateral 09/01/2014   Procedure: BILATERAL SALPINGECTOMY;  Surgeon: Jonnie Kind, MD;  Location: [facility];   Service: Gynecology;  Laterality: Bilateral;  . CESAREAN SECTION    . ckc    . COLONOSCOPY N/A 12/23/2014   IPJ:ASNK canal hemorrhoids left-sided diverticula  . ESOPHAGOGASTRODUODENOSCOPY N/A 12/23/2014   NLZ:JQBHALP reflux/noncritical shatzkis ring s/p dilation/small HH  . EXCISION OF SKIN TAG N/A 09/01/2014   Procedure: EXCISION OF SKIN NEVUS;  Surgeon: Jonnie Kind, MD;  Location: [facility];  Service: Gynecology;  Laterality: N/A;  . FRACTURE SURGERY    . MALONEY DILATION N/A 12/23/2014   Procedure: Venia Minks DILATION;  Surgeon: Daneil Dolin, MD;  Location: [facility];  Service: Endoscopy;  Laterality: N/A;  . SAVORY DILATION N/A 12/23/2014   Procedure: SAVORY DILATION;  Surgeon: Daneil Dolin, MD;  Location: [facility];  Service: Endoscopy;  Laterality: N/A;  . SCAR REVISION N/A 09/01/2014   Procedure: EXCISION OF CICATRIX;  Surgeon: Jonnie Kind, MD;  Location: [facility];  Service: Gynecology;  Laterality: N/A;  . SHOULDER SURGERY Right    rotator cuff  . SPINE SURGERY       OB History    Gravida  10   Para  3   Term      Preterm      AB  7   Living  3     SAB  7   TAB      Ectopic      Multiple      Live Births              Family History  Problem Relation Age of Onset  . Fibroids Mother   . Hypertension Mother   . Depression Mother   . Irritable bowel syndrome Mother   . Arthritis Mother   . Heart disease Mother   . Mental illness Mother   . Stroke Mother   . Vision loss Mother   . Hypertension Father   . Mental illness Father   . Arthritis Father   . Heart disease Father   . Vision loss Father   . Heart disease Maternal Grandmother   . Cancer Maternal Grandmother        breast  . Depression Maternal Grandmother   . Hyperlipidemia Maternal Grandmother   . Hypertension Maternal Grandmother   . Bipolar disorder Daughter   . Schizophrenia Daughter   . Cancer Paternal Grandfather        prostate  . Heart disease Paternal Grandfather   .  Hyperlipidemia Paternal Grandfather   . Hypertension Paternal Grandfather   . Dementia Paternal Grandmother   . Hyperlipidemia Paternal Grandmother   . Hypertension Paternal Grandmother   . Heart disease Maternal Grandfather   . Alzheimer's disease Maternal Grandfather   . Hyperlipidemia Maternal Grandfather   . Hypertension Maternal Grandfather   . Anxiety disorder Daughter   . ADD / ADHD Son   . Hypertension Brother   . Early death Brother   . Learning disabilities Brother   . Mental illness Brother   . Colon cancer Neg Hx     Social History   Tobacco Use  . Smoking status: Current Every  Day Smoker    Packs/day: 0.50    Years: 30.00    Pack years: 15.00    Types: Cigarettes    Start date: 12/04/1982  . Smokeless tobacco: Never Used  Vaping Use  . Vaping Use: Never used  Substance Use Topics  . Alcohol use: No    Alcohol/week: 0.0 standard drinks  . 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 Authorizing Provider  acyclovir (ZOVIRAX) 400 MG tablet Take 1 tablet (400 mg total) by mouth 2 (two) times daily as needed (for prophylaxis). 03/17/20   Kuppelweiser, Cassie L, RPH-CPP  albuterol (VENTOLIN HFA) 108 (90 Base) MCG/ACT inhaler Inhale 2 puffs into the lungs every 4 (four) hours as needed for wheezing or shortness of breath. Reported on 04/06/2016 03/17/20   Kuppelweiser, Cassie L, RPH-CPP  atenolol (TENORMIN) 25 MG tablet Take 1 tablet (25 mg total) by mouth daily. 11/11/20   Evalee Jefferson, PA-C  azelastine (ASTELIN) 0.1 % nasal spray Place 1 spray into both nostrils 2 (two) times daily. 10/12/20   Janora Norlander, DO  bictegravir-emtricitabine-tenofovir AF (BIKTARVY) 50-200-25 MG TABS tablet Take 1 tablet by mouth daily. 03/17/20   Kuppelweiser, Cassie L, RPH-CPP  cycloSPORINE  (RESTASIS) 0.05 % ophthalmic emulsion  08/15/19   [provider]  dexlansoprazole (DEXILANT) 60 MG capsule Take 1 capsule (60 mg total) by mouth daily. 10/12/20   Janora Norlander, DO  EPINEPHrine 0.3 mg/0.3 mL IJ SOAJ injection Inject 0.3 mg into the muscle as needed for anaphylaxis (then call 911). 10/12/20   Janora Norlander, DO  fluconazole (DIFLUCAN) 150 MG tablet Take 1 tablet (150 mg total) by mouth daily. 09/23/20   Carlyle Basques, MD  lamoTRIgine (LAMICTAL) 200 MG tablet Take 1 tablet (200 mg total) by mouth daily. 03/17/20   Kuppelweiser, Cassie L, RPH-CPP  linaclotide (LINZESS) 290 MCG CAPS capsule Take 1 capsule (290 mcg total) by mouth daily. On empty stomach 06/27/19   Ronnie Doss M, DO  montelukast (SINGULAIR) 10 MG tablet Take 1 tablet (10 mg total) by mouth at bedtime. 10/12/20   Janora Norlander, DO  nystatin (MYCOSTATIN/NYSTOP) powder Apply topically 3 (three) times daily. For up to 2 weeks for rash under breast 07/30/19   Ronnie Doss M, DO  ondansetron (ZOFRAN ODT) 8 MG disintegrating tablet Take 1 tablet (8 mg total) by mouth every 8 (eight) hours as needed for nausea or vomiting. 10/12/20   Janora Norlander, DO    Allergies    Flecainide, Naproxen, Piroxicam, Abilify [aripiprazole], Citalopram, Codeine, Depakote [divalproex sodium], Gabapentin, Haldol [haloperidol lactate], Indomethacin, Invega [paliperidone], Latuda [lurasidone hcl], Lithium, Promethazine, Trazodone and nefazodone, and Lexapro [escitalopram oxalate]  Review of Systems   Review of Systems  Constitutional: Negative for chills and fever.  HENT: Negative for congestion and sore throat.   Eyes: Positive for visual disturbance.  Respiratory: Negative for chest tightness and shortness of breath.   Cardiovascular: Positive for leg swelling. Negative for chest pain and palpitations.  Gastrointestinal: Negative for abdominal pain, nausea and vomiting.  Genitourinary: Negative.    Musculoskeletal: Positive for arthralgias. Negative for joint swelling and neck pain.       Chronic knee pain  Skin: Negative.  Negative for rash and wound.  Neurological: Positive for headaches.  Negative for dizziness, weakness, light-headedness and numbness.  Psychiatric/Behavioral: Negative.     Physical Exam Updated Vital Signs BP 130/63 (BP Location: Right Arm)   Pulse 72   Temp 98.1 F (36.7 C) (Oral)   Resp 17   Ht 5\' 6"  (1.676 m)   Wt 117.9 kg   LMP 08/11/2014   SpO2 99%   BMI 41.97 kg/m   Physical Exam Vitals and nursing note reviewed.  Constitutional:      Appearance: She is well-developed.  HENT:     Head: Normocephalic and atraumatic.     Right Ear: Tympanic membrane normal.     Left Ear: Tympanic membrane normal.     Mouth/Throat:     Mouth: Mucous membranes are moist.  Eyes:     Conjunctiva/sclera: Conjunctivae normal.     Visual Fields: Right eye visual fields normal and left eye visual fields normal.     Comments: Right eye: 20/20 Left eye: 20/20 Both eyes: 20/20  Cardiovascular:     Rate and Rhythm: Normal rate and regular rhythm.     Heart sounds: Normal heart sounds. No murmur heard.   Pulmonary:     Effort: Pulmonary effort is normal.     Breath sounds: Normal breath sounds. No wheezing.  Abdominal:     General: Bowel sounds are normal.     Palpations: Abdomen is soft.     Tenderness: There is no abdominal tenderness. There is no guarding.  Musculoskeletal:        General: Normal range of motion.     Cervical back: Normal range of motion.     Right lower leg: No edema.     Left lower leg: No edema.  Skin:    General: Skin is warm and dry.  Neurological:     General: No focal deficit present.     Mental Status: She is alert and oriented to person, place, and time.     Cranial Nerves: Cranial nerves are intact.     Sensory: Sensation is intact.     Motor: Motor function is intact.     Coordination: Coordination normal. Rapid alternating  movements normal.     Gait: Gait is intact.  Psychiatric:        Mood and Affect: Mood normal.     ED Results / Procedures / Treatments   Labs (all labs ordered are listed, but only abnormal results are displayed) Labs Reviewed  BASIC METABOLIC PANEL - Abnormal; Notable for the following components:      Result Value   Glucose, Bld 100 (*)    All other components within normal limits  URINALYSIS, ROUTINE W REFLEX MICROSCOPIC - Abnormal; Notable for the following components:   Color, Urine STRAW (*)    Specific Gravity, Urine 1.003 (*)    All other components within normal limits  CBC  TROPONIN I (HIGH SENSITIVITY)  TROPONIN I (HIGH SENSITIVITY)    EKG EKG Interpretation  Date/Time:  Wednesday November 10 2020 11:10:51 EST Ventricular Rate:  70 PR Interval:  126 QRS Duration: 80 QT Interval:  398 QTC Calculation: 429 R Axis:   65 Text Interpretation: Normal sinus rhythm Normal ECG since last tracing no significant change Confirmed by Noemi Chapel [phone]) on 11/10/2020 11:49:49 AM   Radiology DG Chest 2 View  Result Date: 11/10/2020 CLINICAL DATA:  Nonproductive cough and shortness of breath over the last several days. History of asthma. EXAM: CHEST - 2 VIEW COMPARISON:  None. FINDINGS: Heart size is normal. Atherosclerotic  tortuosity of the thoracic aorta. Loop recorder in place. Mild atelectasis or scarring in the right middle lobe. Pulmonary vascularity is normal. No effusions. Ordinary mild degenerative changes affect the spine. IMPRESSION: Mild atelectasis or scarring in the right middle lobe. No active disease otherwise. Electronically Signed   By: Nelson Chimes M.D.   On: 11/10/2020 11:49   CT Head Wo Contrast  Result Date: 11/10/2020 CLINICAL DATA:  Intracranial hemorrhage suspected. Hypertension. Headache. EXAM: CT HEAD WITHOUT CONTRAST TECHNIQUE: Contiguous axial images were obtained from the base of the skull through the vertex without intravenous contrast.  COMPARISON:  None. FINDINGS: Brain: No evidence of large-territorial acute infarction. No parenchymal hemorrhage. No mass lesion. No extra-axial collection. No mass effect or midline shift. No hydrocephalus. Basilar cisterns are patent. Vascular: No hyperdense vessel. Skull: No acute fracture or focal lesion. Sinuses/Orbits: Paranasal sinuses and mastoid air cells are clear. The orbits are unremarkable. Other: None. IMPRESSION: No acute intracranial abnormality. Electronically Signed   By: Iven Finn M.D.   On: 11/10/2020 15:02    Procedures Procedures (including critical care time)  Medications Ordered in ED Medications  atenolol (TENORMIN) tablet 25 mg (has no administration in time range)  acetaminophen (TYLENOL) tablet 1,000 mg (1,000 mg Oral Given 11/10/20 1433)    ED Course  I have reviewed the triage vital signs and the nursing notes.  Pertinent labs & imaging results that were available during my care of the patient were reviewed by me and considered in my medical decision making (see chart for details).    MDM Rules/Calculators/A&P                          Labs, imaging and exam reassuring with no signs of end organ damage with bp elevation. Review of chart - pt was on atenolol 100 mg qd, she was stopped when her bp became better controlled.  Will start low dose 25 mg qd with plans for recheck of bp with pcp in 1 week.  Pt also endorses increased home stressors and chronic knee pain which may be contributing to htn.  Advised she may be on this med short term, or may need to be increased depending on how she responds to this low dose.    The patient appears reasonably screened and/or stabilized for discharge and I doubt any other medical condition or other [facility] requiring further screening, evaluation, or treatment in the ED at this time prior to discharge.  Final Clinical Impression(s) / ED Diagnoses Final diagnoses:  Primary hypertension    Rx / DC Orders ED Discharge  Orders         Ordered    atenolol (TENORMIN) 25 MG tablet  Daily        11/10/20 1547           Evalee Jefferson, PA-C 11/10/20 1603    Noemi Chapel, MD 11/11/20 1447

## 2020-11-10 NOTE — Telephone Encounter (Signed)
Spoke with Rachel [facility] this morning to check on BP status.  She states she has been monitoring her BP at home and reports a reading of 220/170-asymptomatic this morning upon awakening.  She had notified her cardiologist and PCP this morning and was waiting for a referral to cardiology from PCP since she has not been seen by cards in quite some time.  Again discussed risks of waiting for evaluation and advised her to be seen at local ER.  She acknowledged understanding and agreed to go to local ER in [place] today.

## 2020-11-10 NOTE — Discharge Instructions (Addendum)
Your exam, labs at CT imaging are reassuring today with no evidence of long term damage from your elevated blood pressure at this time.  It will be important for you to have your blood pressure rechecked in one week.  Call your primary MD for an office visit in one week for this recheck.  In the interim, take the medicine as prescribed.  This dose may need to be adjusted depending on how your blood pressure responds.

## 2020-11-10 NOTE — Telephone Encounter (Signed)
REFERRAL REQUEST Telephone Note  Have you been seen at our office for this problem? No (Advise that they may need an appointment with their PCP before a referral can be done)  Reason for Referral: HIGH Blood Pressure--today at 9:00 am 184/117.  Pt called to schedule an apt for high bp--we told her to go to urgent care or ER. She went to Infectious Disease with [facility] [phone]) yesterday because she had and apt. PT bp was checked--the office called [place] because bp was high and we told them that pt needed to go to urgent care because of no apts.  She said she has not been on bp medication for years--but does not want to go er. Please call her back.  Referral discussed with patient: No  Best contact number of patient for referral team:  [phone]   Has patient been seen by a specialist for this issue before: yes in 2017 but she does not remember who it was with.  Patient provider preference for referral: [place] Patient location preference for referral: [place]   Patient notified that referrals can take up to a week or longer to process. If they haven't heard anything within a week they should call back and speak with the referral department.

## 2020-11-10 NOTE — ED Triage Notes (Addendum)
Pt c/o hypertension that began yesterday. Pt states she has been prescribed antihypertensive in the past, but has not taken them since 2017.

## 2020-11-10 NOTE — ED Notes (Signed)
Patient transported to CT 

## 2020-11-10 NOTE — ED Notes (Signed)
Right eye: 20/20 Left eye: 20/20 Both eyes: 20/20

## 2020-11-11 ENCOUNTER — Telehealth: Payer: Self-pay

## 2020-11-11 NOTE — Telephone Encounter (Signed)
Her BP was low normal here in office last month.  Can we have her come in for BP check with nurse ASAP?

## 2020-11-11 NOTE — Telephone Encounter (Signed)
lmtcb

## 2020-11-11 NOTE — Telephone Encounter (Signed)
Transition Care Management Unsuccessful Follow-up Telephone Call  Date of discharge and from where: 11/10/2020 [facility]  Attempts:  1st Attempt  Reason for unsuccessful TCM follow-up call:  Left voice message

## 2020-11-12 ENCOUNTER — Other Ambulatory Visit: Payer: Self-pay | Admitting: Pharmacist

## 2020-11-12 DIAGNOSIS — B20 Human immunodeficiency virus [HIV] disease: Secondary | ICD-10-CM

## 2020-11-12 MED ORDER — BIKTARVY 50-200-25 MG PO TABS: 1.0000 | ORAL_TABLET | Freq: Every day | ORAL | 5 refills | Status: DC

## 2020-11-12 MED FILL — BIKTARVY 50-200-25 MG TABS: 50-200-25 | 30 days supply | Qty: 30 | Fill #0

## 2020-11-12 NOTE — Telephone Encounter (Signed)
Transition Care Management Follow-up Telephone Call  Date of discharge and from where: 11/10/2020 [facility]  How have you been since you were released from the hospital? Is still having headaches.   Any questions or concerns? No  Items Reviewed:  Did the pt receive and understand the discharge instructions provided? Yes   Medications obtained and verified? Yes   Other? No   Any new allergies since your discharge? No   Dietary orders reviewed? Yes  Do you have support at home? Yes    Functional Questionnaire: (I = Independent and D = Dependent) ADLs: I  Bathing/Dressing- I  Meal Prep- I  Eating- I  Maintaining continence- I  Transferring/Ambulation- I  Managing Meds- I  Follow up appointments reviewed:   PCP Hospital f/u appt confirmed? No  Patient stated [facility] called her yesterday for a ED follow up. Patient stated she was unable to set up appointment at that time but will call back.   [facility] f/u appt confirmed? No    Are transportation arrangements needed? No   If their condition worsens, is the pt aware to call PCP or go to the Emergency Dept.? Yes  Was the patient provided with contact information for the PCP's office or ED? Yes  Was to pt encouraged to call back with questions or concerns? Yes

## 2020-11-15 ENCOUNTER — Telehealth: Payer: Self-pay

## 2020-11-15 NOTE — Telephone Encounter (Signed)
Patient went to ER with elevated blood pressure.  She was evaluated and prescribed Atenolol and told to follow up with PCP.  Appointment scheduled with Dr. Lajuana Ripple on 11/30/2020.

## 2020-11-16 NOTE — Telephone Encounter (Signed)
Refer to phone note from 12/13 This encounter will be closed

## 2020-11-18 DIAGNOSIS — M25562 Pain in left knee: Secondary | ICD-10-CM | POA: Diagnosis not present

## 2020-11-18 DIAGNOSIS — S83242D Other tear of medial meniscus, current injury, left knee, subsequent encounter: Secondary | ICD-10-CM | POA: Diagnosis not present

## 2020-11-18 DIAGNOSIS — S8992XD Unspecified injury of left lower leg, subsequent encounter: Secondary | ICD-10-CM | POA: Diagnosis not present

## 2020-11-18 DIAGNOSIS — G8929 Other chronic pain: Secondary | ICD-10-CM | POA: Diagnosis not present

## 2020-11-23 ENCOUNTER — Telehealth: Payer: Self-pay

## 2020-11-23 ENCOUNTER — Other Ambulatory Visit: Payer: Self-pay | Admitting: Family Medicine

## 2020-11-23 DIAGNOSIS — F3170 Bipolar disorder, currently in remission, most recent episode unspecified: Secondary | ICD-10-CM

## 2020-11-23 MED ORDER — LAMOTRIGINE 200 MG PO TABS: 200.0000 mg | ORAL_TABLET | Freq: Every day | ORAL | 3 refills | Status: DC

## 2020-11-23 MED FILL — SUBVENITE 200 MG TABS: 200 | 30 days supply | Qty: 30 | Fill #0

## 2020-11-23 NOTE — Telephone Encounter (Signed)
Left message informing pt that Dr. Lajuana Ripple refilled her Lamictal and to call back if she had any concerns.

## 2020-11-23 NOTE — Telephone Encounter (Signed)
Received call from patient requesting refill for her Lamictal. Patient mentioned that PCP was possibly supposed to be refilling from now on. RN spoke with nurse Janett Billow at Dr. Marjean Donna office, she will see if they are able to take over the refills and let the patient know.   Beryle Flock, RN

## 2020-11-23 NOTE — Telephone Encounter (Signed)
I would prefer her PCP do so. Thank you!

## 2020-11-23 NOTE — Telephone Encounter (Signed)
Sure. Refills sent.

## 2020-11-23 NOTE — Telephone Encounter (Signed)
Received a call from infectious disease wanting to know if we could start filling patients Lamictal ? States they filled it last but they want to see if PCP will start filling since they are infectious disease ? If so patient is requesting a refill.   Call back patient and let her know

## 2020-11-24 ENCOUNTER — Inpatient Hospital Stay (HOSPITAL_COMMUNITY): Admit: 2020-11-24 | Payer: Medicaid Other

## 2020-11-27 ENCOUNTER — Other Ambulatory Visit: Payer: Self-pay

## 2020-11-27 ENCOUNTER — Emergency Department (HOSPITAL_COMMUNITY)
Admission: EM | Admit: 2020-11-27 | Discharge: 2020-11-27 | Discharge status: Against Medical Advice | Payer: BLUE CROSS/BLUE SHIELD

## 2020-11-30 ENCOUNTER — Other Ambulatory Visit: Payer: Self-pay

## 2020-11-30 ENCOUNTER — Ambulatory Visit (INDEPENDENT_AMBULATORY_CARE_PROVIDER_SITE_OTHER): Payer: Medicaid Other | Admitting: Family Medicine

## 2020-11-30 DIAGNOSIS — Z20822 Contact with and (suspected) exposure to covid-19: Secondary | ICD-10-CM

## 2020-11-30 DIAGNOSIS — I10 Essential (primary) hypertension: Secondary | ICD-10-CM

## 2020-11-30 NOTE — Progress Notes (Signed)
Telephone visit  Subjective: CC: HTN PCP: Janora Norlander, DO [id number] Rachel Underwood is a 49 y.o. female calls for telephone consult today. Patient provides verbal consent for consult held via phone.  Due to COVID-19 pandemic this visit was conducted virtually. This visit type was conducted due to national recommendations for restrictions regarding the COVID-19 Pandemic (e.g. social distancing, sheltering in place) in an effort to limit this patient's exposure and mitigate transmission in our community. All issues noted in this document were discussed and addressed.  A physical exam was not performed with this format.   Location of patient: [facility] Location of provider: [facility] Others present for call: none  1. Exposure to COVID She reports onset of COVID symptoms x1 month.  She reports several bouts of diarrhea over the last few days.  She reports cough, abdominal pain.  She reports some SOB with moderate exertion.  No hemoptysis.  She reports nausea, no vomiting.  She is trying to stay hydrated.  Last fever was this am 100.88F. TMax 101 F. Onset of fever was 3-4 days ago.   2. Hypertension BPs have been back to normal range since going on BP meds <140/90. No CP.   ROS: Per HPI  Allergies  Allergen Reactions  . Flecainide Swelling  . Naproxen Anaphylaxis and Hives  . Piroxicam Anaphylaxis  . Abilify [Aripiprazole]   . Citalopram Hives  . Codeine Nausea And Vomiting  . Depakote [Divalproex Sodium] Swelling  . Gabapentin     Hives   . Haldol [Haloperidol Lactate] Other (See Comments)    Slurs speech and "stumble"  . Indomethacin Swelling    Dizzy, tigling of face  . Invega [Paliperidone] Other (See Comments)    Slurred speech, drooling  . Latuda [Lurasidone Hcl] Itching  . Lithium Hives  . Promethazine Swelling    Arms and hands  . Trazodone And Nefazodone Other (See Comments)    Dry mouth, congestion, severe dryness.  [name] [Escitalopram Oxalate] Rash   Past Medical  History:  Diagnosis Date  . Abnormal Pap smear of cervix 2005(EST)   [facility] abnl pap- [facility] [place]  . Allergy   . Asthma   . Bipolar 1 disorder ([place])   . Borderline personality disorder ([place])   . BPD (bronchopulmonary dysplasia)   . Carpal tunnel syndrome    bilateral wrist  . Depression   . Dysmenorrhea 06/03/2014  . Enlarged uterus 06/03/2014  . Fibromyalgia   . GERD (gastroesophageal reflux disease)   . Herpes simplex without mention of complication   . HIV (human immunodeficiency virus infection) ([place])   . Hyperlipidemia   . Hypertension   . Menorrhagia with irregular cycle 06/03/2014  . Osteoarthritis   . Seizures ([place])    had as a teenager, unknown etiology and no meds.    Current Outpatient Medications:  .  acyclovir (ZOVIRAX) 400 MG tablet, Take 1 tablet (400 mg total) by mouth 2 (two) times daily as needed (for prophylaxis)., Disp: 60 tablet, Rfl: 5 .  albuterol (VENTOLIN HFA) 108 (90 Base) MCG/ACT inhaler, Inhale 2 puffs into the lungs every 4 (four) hours as needed for wheezing or shortness of breath. Reported on 04/06/2016, Disp: 18 g, Rfl: 5 .  atenolol (TENORMIN) 25 MG tablet, Take 1 tablet (25 mg total) by mouth daily., Disp: 30 tablet, Rfl: 0 .  azelastine (ASTELIN) 0.1 % nasal spray, Place 1 spray into both nostrils 2 (two) times daily., Disp: 30 mL, Rfl: 12 .  bictegravir-emtricitabine-tenofovir AF (  BIKTARVY) 50-200-25 MG TABS tablet, Take 1 tablet by mouth daily., Disp: 30 tablet, Rfl: 5 .  cycloSPORINE (RESTASIS) 0.05 % ophthalmic emulsion, , Disp: , Rfl:  .  dexlansoprazole (DEXILANT) 60 MG capsule, Take 1 capsule (60 mg total) by mouth daily., Disp: 90 capsule, Rfl: 3 .  EPINEPHrine 0.3 mg/0.3 mL IJ SOAJ injection, Inject 0.3 mg into the muscle as needed for anaphylaxis (then call 911)., Disp: 1 each, Rfl: 0 .  fluconazole (DIFLUCAN) 150 MG tablet, Take 1 tablet (150 mg total) by mouth daily., Disp: 7 tablet, Rfl: 0 .  lamoTRIgine (LAMICTAL) 200 MG  tablet, Take 1 tablet (200 mg total) by mouth daily., Disp: 30 tablet, Rfl: 3 .  linaclotide (LINZESS) 290 MCG CAPS capsule, Take 1 capsule (290 mcg total) by mouth daily. On empty stomach, Disp: 30 capsule, Rfl: 2 .  montelukast (SINGULAIR) 10 MG tablet, Take 1 tablet (10 mg total) by mouth at bedtime., Disp: 30 tablet, Rfl: 3 .  nystatin (MYCOSTATIN/NYSTOP) powder, Apply topically 3 (three) times daily. For up to 2 weeks for rash under breast, Disp: 30 g, Rfl: 1 .  ondansetron (ZOFRAN ODT) 8 MG disintegrating tablet, Take 1 tablet (8 mg total) by mouth every 8 (eight) hours as needed for nausea or vomiting., Disp: 30 tablet, Rfl: 2  Assessment/ Plan: 49 y.o. female   Exposure to confirmed case of COVID-19 - Plan: Novel Coronavirus, NAA ([facility])  Suspected COVID-19 virus infection - Plan: Novel Coronavirus, NAA ([facility])  Essential hypertension  Concern that she likely is positive for COVID-19.  Her likely diagnosis is complicated by HIV.  I discussed with her that she should absolutely get tested for COVID-19 and that in the event her test is positive we should strongly try and get her into that monoclonal antibody infusion clinic given HIV status.  She voiced good understanding of the recommendations.  I offered cough medication but she declined.  OTC medications are working well enough at this time.  She has Zofran on hand if needed for nausea.  She will stay hydrated.  She understands red flag signs and symptoms warranting further evaluation emergency department.  Blood pressure is controlled at home with addition of blood pressure medicine.  Start time: 10:10am End time: 10:18am  Total time spent on patient care (including telephone call/ virtual visit): 8 minutes  Lamir Racca Hulen Skains, DO [facility] [phone]

## 2020-11-30 NOTE — Addendum Note (Signed)
Addended by: Margorie John on: 11/30/2020 11:29 AM   Modules accepted: Orders

## 2020-12-01 LAB — SARS-COV-2, NAA 2 DAY TAT

## 2020-12-01 LAB — NOVEL CORONAVIRUS, NAA: SARS-CoV-2, NAA: DETECTED — AB

## 2020-12-02 ENCOUNTER — Telehealth: Payer: Self-pay | Admitting: Internal Medicine

## 2020-12-02 ENCOUNTER — Telehealth: Payer: Self-pay

## 2020-12-02 NOTE — Telephone Encounter (Signed)
Called to discuss with patient about Covid symptoms and the use of a monoclonal antibody infusion for those with mild to moderate Covid symptoms and at a high risk of hospitalization.   Pt is qualified for the monoclonal antibody infusion, but due to medication shortages we cannot offer the pt the infusion at this time. This decision was based on clinical judgement as well as the the NIH Covid 19 treatment guidelines for treatment prioritization and equitable access. Symptoms tier reviewed as well as criteria for ending isolation. Preventative practices reviewed. Patient verbalized understanding.  Pt is on day 7 since symptom onset. Offered to put her on cancellation list for today, but she refused d/t transportation issues. We are unable to offer same-day transportation. I have provided pt with several contact numbers and [facility] number to call and see if infusion available somewhere else.   Cyndee Brightly, NP [facility]   Marcy Salvo, NP  12/02/2020 11:49 AM

## 2020-12-02 NOTE — Telephone Encounter (Signed)
Called patient and gave her the [phone] to call and make appt for infusion per Asher Muir.

## 2020-12-08 ENCOUNTER — Ambulatory Visit (HOSPITAL_COMMUNITY): Payer: Medicaid Other

## 2020-12-09 MED FILL — BIKTARVY 50-200-25 MG TABS: 50-200-25 | 30 days supply | Qty: 30 | Fill #1

## 2020-12-09 MED FILL — SERTRALINE HCL 100 MG TABS: 100 | 30 days supply | Qty: 60 | Fill #1

## 2020-12-09 MED FILL — CROMOLYN 4% EYE DROPS: 4 | 34 days supply | Qty: 10 | Fill #1

## 2020-12-09 MED FILL — AZELASTINE HCL 137 MCG/SPRA: 137 | 30 days supply | Qty: 30 | Fill #1

## 2020-12-09 MED FILL — MONTELUKAST SOD 10 MG TAB: 10 | 30 days supply | Qty: 30 | Fill #1

## 2020-12-14 ENCOUNTER — Ambulatory Visit: Payer: Medicaid Other | Admitting: Infectious Diseases

## 2020-12-14 ENCOUNTER — Ambulatory Visit: Payer: Medicaid Other | Admitting: Internal Medicine

## 2020-12-24 ENCOUNTER — Ambulatory Visit: Payer: Medicaid Other | Admitting: Internal Medicine

## 2020-12-24 MED FILL — SUBVENITE 200 MG TABS: 200 | 30 days supply | Qty: 30 | Fill #1

## 2020-12-24 MED FILL — ONDANSETRON ODT 8 MG TABLET: 8 | 10 days supply | Qty: 30 | Fill #1

## 2020-12-29 ENCOUNTER — Telehealth: Payer: Self-pay

## 2020-12-29 MED FILL — DEXILANT DR 60 MG CAPSULE: 60 | 90 days supply | Qty: 90 | Fill #1

## 2020-12-29 NOTE — Telephone Encounter (Signed)
Left message to call back.  Need to know the names of the medications she is requesting refills for.

## 2020-12-29 NOTE — Telephone Encounter (Signed)
Left message to call back.  Potassium or a fluid pill not on patient's current medication list.  Explained this on voicemail and asked patient to return call.

## 2020-12-29 NOTE — Telephone Encounter (Signed)
Tried to ask the name of medications pt said that should be on her file--I could not help with with narrowing down to which rx. Please call back

## 2020-12-29 NOTE — Telephone Encounter (Signed)
  Prescription Request  12/29/2020  What is the name of the medication or equipment? Water pills and potassium (does not know the name of the medications)  Have you contacted your pharmacy to request a refill? (if applicable)yes  Which pharmacy would you like this sent to? [facility]    Patient notified that their request is being sent to the clinical staff for review and that they should receive a response within 2 business days.

## 2020-12-30 ENCOUNTER — Other Ambulatory Visit: Payer: Self-pay

## 2020-12-30 ENCOUNTER — Emergency Department (HOSPITAL_COMMUNITY)
Admission: EM | Admit: 2020-12-30 | Discharge: 2020-12-31 | Disposition: A | Discharge status: Home / Self Care | Payer: Medicaid Other | Attending: Emergency Medicine | Admitting: Emergency Medicine

## 2020-12-30 ENCOUNTER — Encounter (HOSPITAL_COMMUNITY): Payer: Self-pay | Admitting: Emergency Medicine

## 2020-12-30 DIAGNOSIS — F17210 Nicotine dependence, cigarettes, uncomplicated: Secondary | ICD-10-CM | POA: Insufficient documentation

## 2020-12-30 DIAGNOSIS — G43009 Migraine without aura, not intractable, without status migrainosus: Secondary | ICD-10-CM | POA: Insufficient documentation

## 2020-12-30 DIAGNOSIS — J45909 Unspecified asthma, uncomplicated: Secondary | ICD-10-CM | POA: Insufficient documentation

## 2020-12-30 DIAGNOSIS — I10 Essential (primary) hypertension: Secondary | ICD-10-CM | POA: Diagnosis not present

## 2020-12-30 DIAGNOSIS — Z21 Asymptomatic human immunodeficiency virus [HIV] infection status: Secondary | ICD-10-CM | POA: Insufficient documentation

## 2020-12-30 DIAGNOSIS — Z79899 Other long term (current) drug therapy: Secondary | ICD-10-CM | POA: Diagnosis not present

## 2020-12-30 MED ORDER — METOCLOPRAMIDE HCL 5 MG/ML IJ SOLN
10.0000 mg | Freq: Once | INTRAMUSCULAR | Status: AC
  Administered 2020-12-31: 10 mg via INTRAVENOUS
  Filled 2020-12-30: qty 2

## 2020-12-30 MED ORDER — DIPHENHYDRAMINE HCL 50 MG/ML IJ SOLN
25.0000 mg | Freq: Once | INTRAMUSCULAR | Status: AC
  Administered 2020-12-31: 25 mg via INTRAVENOUS
  Filled 2020-12-30: qty 1

## 2020-12-30 MED ORDER — LACTATED RINGERS IV BOLUS
1000.0000 mL | Freq: Once | INTRAVENOUS | Status: AC
  Administered 2020-12-31: 1000 mL via INTRAVENOUS

## 2020-12-30 MED ORDER — DEXAMETHASONE SODIUM PHOSPHATE 10 MG/ML IJ SOLN
10.0000 mg | Freq: Once | INTRAMUSCULAR | Status: AC
  Administered 2020-12-31: 10 mg via INTRAVENOUS
  Filled 2020-12-30: qty 1

## 2020-12-30 NOTE — ED Provider Notes (Signed)
[facility] EMERGENCY DEPARTMENT Provider Note   CSN: [id number] Arrival date & time: 12/30/20  2020   History Chief Complaint  Patient presents with   Headache    Rachel Underwood is a 50 y.o. female.  The history is provided by the patient.  Headache She has history of hypertension, hyperlipidemia, schizophrenia, HIV disease and comes in because of a headache for the last 3 days.  Headache is global describes it as a pounding sensation which he rates at 9/10.  There is associated photophobia and phonophobia, nausea and vomiting.  She has taken acetaminophen without relief.  Headache is typical of migraines that she has had in the past, but somewhat more severe.  She denies fever or chills.  She denies any weakness or numbness.  Past Medical History:  Diagnosis Date   Abnormal Pap smear of cervix 2005(EST)   [facility]- [facility] [place]   Allergy    Asthma    Bipolar 1 disorder ([place])    Borderline personality disorder ([place])    BPD (bronchopulmonary dysplasia)    Carpal tunnel syndrome    bilateral wrist   Depression    Dysmenorrhea 06/03/2014   Enlarged uterus 06/03/2014   Fibromyalgia    GERD (gastroesophageal reflux disease)    Herpes simplex without mention of complication    HIV (human immunodeficiency virus infection) ([place])    Hyperlipidemia    Hypertension    Menorrhagia with irregular cycle 06/03/2014   Osteoarthritis    Seizures ([place])    had as a teenager, unknown etiology and no meds.    Patient Active Problem List   Diagnosis Date Noted   Bilateral carpal tunnel syndrome 02/17/2016   Allergic rhinitis due to pollen 02/17/2016   Headache 11/09/2015   Chronic back pain 11/09/2015   Diarrhea 05/19/2015   Cysts of both ovaries 05/12/2015   Abdominal wall hernia 02/03/2015   Abdominal wall mass of periumbilical region [id number]   Schatzki's ring    Diverticulosis of colon without hemorrhage    Other hemorrhoids     Rectal bleeding 12/11/2014   GERD (gastroesophageal reflux disease) 12/11/2014   Esophageal dysphagia 12/11/2014   Constipation 12/11/2014   S/P hysterectomy 09/01/2014   Pelvic pain in female 07/22/2014   Anovulation 06/18/2014   DUB (dysfunctional uterine bleeding) 06/18/2014   Menorrhagia with irregular cycle 06/03/2014   Enlarged uterus 06/03/2014   HTN (hypertension) 01/15/2014   Human immunodeficiency virus (HIV) disease ([place]) 07/17/2013   Schizophrenia ([place]) 07/17/2013   Bipolar disorder, unspecified ([place]) 07/17/2013    Past Surgical History:  Procedure Laterality Date   ABDOMINAL HYSTERECTOMY N/A 09/01/2014   Procedure: HYSTERECTOMY ABDOMINAL;  Surgeon: Jonnie Kind, MD;  Location: [facility];  Service: Gynecology;  Laterality: N/A;   ANKLE SURGERY Right    repair of ligaments   BACK SURGERY     BILATERAL SALPINGECTOMY Bilateral 09/01/2014   Procedure: BILATERAL SALPINGECTOMY;  Surgeon: Jonnie Kind, MD;  Location: [facility];  Service: Gynecology;  Laterality: Bilateral;   CESAREAN SECTION     ckc     COLONOSCOPY N/A 12/23/2014   [id number] canal hemorrhoids left-sided diverticula   ESOPHAGOGASTRODUODENOSCOPY N/A 12/23/2014   [id number] reflux/noncritical shatzkis ring s/p dilation/small HH   EXCISION OF SKIN TAG N/A 09/01/2014   Procedure: EXCISION OF SKIN NEVUS;  Surgeon: Jonnie Kind, MD;  Location: [facility];  Service: Gynecology;  Laterality: N/A;   FRACTURE SURGERY     MALONEY DILATION N/A 12/23/2014  Procedure: MALONEY DILATION;  Surgeon: Daneil Dolin, MD;  Location: [facility];  Service: Endoscopy;  Laterality: N/A;   SAVORY DILATION N/A 12/23/2014   Procedure: SAVORY DILATION;  Surgeon: Daneil Dolin, MD;  Location: [facility];  Service: Endoscopy;  Laterality: N/A;   SCAR REVISION N/A 09/01/2014   Procedure: EXCISION OF CICATRIX;  Surgeon: Jonnie Kind, MD;  Location: [facility];  Service: Gynecology;  Laterality: N/A;   SHOULDER  SURGERY Right    rotator cuff   SPINE SURGERY       OB History    Gravida  10   Para  3   Term      Preterm      AB  7   Living  3     SAB  7   IAB      Ectopic      Multiple      Live Births              Family History  Problem Relation Age of Onset   Fibroids Mother    Hypertension Mother    Depression Mother    Irritable bowel syndrome Mother    Arthritis Mother    Heart disease Mother    Mental illness Mother    Stroke Mother    Vision loss Mother    Hypertension Father    Mental illness Father    Arthritis Father    Heart disease Father    Vision loss Father    Heart disease Maternal Grandmother    Cancer Maternal Grandmother        breast   Depression Maternal Grandmother    Hyperlipidemia Maternal Grandmother    Hypertension Maternal Grandmother    Bipolar disorder Daughter    Schizophrenia Daughter    Cancer Paternal Grandfather        prostate   Heart disease Paternal Grandfather    Hyperlipidemia Paternal Grandfather    Hypertension Paternal Grandfather    Dementia Paternal Grandmother    Hyperlipidemia Paternal Grandmother    Hypertension Paternal Grandmother    Heart disease Maternal Grandfather    Alzheimer's disease Maternal Grandfather    Hyperlipidemia Maternal Grandfather    Hypertension Maternal Grandfather    Anxiety disorder Daughter    ADD / ADHD Son    Hypertension Brother    Early death Brother    Learning disabilities Brother    Mental illness Brother    Colon cancer Neg Hx     Social History   Tobacco Use   Smoking status: Current Every Day Smoker    Packs/day: 0.50    Years: 30.00    Pack years: 15.00    Types: Cigarettes    Start date: 12/04/1982   Smokeless tobacco: Never Used  Vaping Use   Vaping Use: Never used  Substance Use Topics   Alcohol use: No    Alcohol/week: 0.0 standard drinks   Drug use: No    Frequency: 1.0 times per week    Types:  "Crack" cocaine    Comment: 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 Authorizing Provider  acyclovir (ZOVIRAX) 400 MG tablet Take 1 tablet (400 mg total) by mouth 2 (two) times daily as needed (for prophylaxis). 03/17/20   Kuppelweiser, Cassie L, RPH-CPP  albuterol (VENTOLIN HFA) 108 (90 Base) MCG/ACT inhaler Inhale 2 puffs into the  lungs every 4 (four) hours as needed for wheezing or shortness of breath. Reported on 04/06/2016 03/17/20   Kuppelweiser, Cassie L, RPH-CPP  atenolol (TENORMIN) 25 MG tablet Take 1 tablet (25 mg total) by mouth daily. 11/11/20   Evalee Jefferson, PA-C  azelastine (ASTELIN) 0.1 % nasal spray Place 1 spray into both nostrils 2 (two) times daily. 10/12/20   Janora Norlander, DO  bictegravir-emtricitabine-tenofovir AF (BIKTARVY) 50-200-25 MG TABS tablet Take 1 tablet by mouth daily. 11/12/20   Kuppelweiser, Cassie L, RPH-CPP  cycloSPORINE (RESTASIS) 0.05 % ophthalmic emulsion  08/15/19   [provider]  dexlansoprazole (DEXILANT) 60 MG capsule Take 1 capsule (60 mg total) by mouth daily. 10/12/20   Janora Norlander, DO  EPINEPHrine 0.3 mg/0.3 mL IJ SOAJ injection Inject 0.3 mg into the muscle as needed for anaphylaxis (then call 911). 10/12/20   Janora Norlander, DO  fluconazole (DIFLUCAN) 150 MG tablet Take 1 tablet (150 mg total) by mouth daily. 09/23/20   Carlyle Basques, MD  lamoTRIgine (LAMICTAL) 200 MG tablet Take 1 tablet (200 mg total) by mouth daily. 11/23/20   Janora Norlander, DO  linaclotide (LINZESS) 290 MCG CAPS capsule Take 1 capsule (290 mcg total) by mouth daily. On empty stomach 06/27/19   Ronnie Doss M, DO  montelukast (SINGULAIR) 10 MG tablet Take 1 tablet (10 mg total) by mouth at bedtime. 10/12/20   Janora Norlander, DO  nystatin (MYCOSTATIN/NYSTOP) powder Apply topically 3 (three) times daily. For up to 2 weeks for rash under  breast 07/30/19   Ronnie Doss M, DO  ondansetron (ZOFRAN ODT) 8 MG disintegrating tablet Take 1 tablet (8 mg total) by mouth every 8 (eight) hours as needed for nausea or vomiting. 10/12/20   Janora Norlander, DO    Allergies    Flecainide, Naproxen, Piroxicam, Abilify [aripiprazole], Citalopram, Codeine, Depakote [divalproex sodium], Gabapentin, Haldol [haloperidol lactate], Indomethacin, Invega [paliperidone], Latuda [lurasidone hcl], Lithium, Promethazine, Trazodone and nefazodone, and Lexapro [escitalopram oxalate]  Review of Systems   Review of Systems  Neurological: Positive for headaches.  All other systems reviewed and are negative.   Physical Exam Updated Vital Signs BP (!) 167/105    Pulse 76    Temp 97.9 F (36.6 C)    Resp 18    Ht 5\' 6"  (1.676 m)    Wt 122.5 kg    LMP 08/11/2014    SpO2 96%    BMI 43.58 kg/m   Physical Exam Vitals and nursing note reviewed.   50 year old female, resting comfortably and in no acute distress. Vital signs are significant for elevated blood pressure. Oxygen saturation is 96%, which is normal. Head is normocephalic and atraumatic. PERRLA, EOMI. Oropharynx is clear.  Photophobia is present. Neck is nontender and supple without adenopathy or JVD. Back is nontender and there is no CVA tenderness. Lungs are clear without rales, wheezes, or rhonchi. Chest is nontender. Heart has regular rate and rhythm without murmur. Abdomen is soft, flat, nontender without masses or hepatosplenomegaly and peristalsis is normoactive. Extremities have no cyanosis or edema, full range of motion is present. Skin is warm and dry without rash. Neurologic: Mental status is normal, cranial nerves are intact.  Motor strength is 5/5 in all 4 extremities.  There are no sensory deficits identified.  ED Results / Procedures / Treatments    Procedures Procedures   Medications Ordered in ED Medications  lactated ringers bolus 1,000 mL (0 mLs Intravenous Stopped  12/31/20 0122)  metoCLOPramide (  REGLAN) injection 10 mg (10 mg Intravenous Given 12/31/20 0006)  diphenhydrAMINE (BENADRYL) injection 25 mg (25 mg Intravenous Given 12/31/20 0008)  dexamethasone (DECADRON) injection 10 mg (10 mg Intravenous Given 12/31/20 0006)  lactated ringers bolus 1,000 mL (0 mLs Intravenous Stopped 12/31/20 0436)  dihydroergotamine (DHE) injection 1 mg (1 mg Intravenous Given 12/31/20 0400)    ED Course  I have reviewed the triage vital signs and the nursing notes.  MDM Rules/Calculators/A&P Headache which does seem typical for a migraine headache.  Old records are reviewed, and she does have a prior ED visit for headache.  There are no red flags to suggest more serious causes of headache.  Elevated blood pressure is likely a physiologic response to pain, will need to follow blood pressure as she gets headache relief.  She is given a headache cocktail of normal saline, metoclopramide, diphenhydramine, dexamethasone.  She had partial relief of headache with above-noted treatment.  She is given a dose of dihydroergotamine as well as additional lactated Ringer's with excellent relief of her headache.  She is discharged with instructions to follow-up with PCP.  Final Clinical Impression(s) / ED Diagnoses Final diagnoses:  Migraine without aura and without status migrainosus, not intractable    Rx / DC Orders ED Discharge Orders    None       Delora Fuel, MD 03/55/97 [phone]

## 2020-12-30 NOTE — ED Triage Notes (Signed)
Pt c/o headache with n/v. 

## 2020-12-31 ENCOUNTER — Telehealth: Payer: Self-pay

## 2020-12-31 MED ORDER — LACTATED RINGERS IV BOLUS
1000.0000 mL | Freq: Once | INTRAVENOUS | Status: AC
  Administered 2020-12-31: 1000 mL via INTRAVENOUS

## 2020-12-31 MED ORDER — DIHYDROERGOTAMINE MESYLATE 1 MG/ML IJ SOLN
1.0000 mg | Freq: Once | INTRAMUSCULAR | Status: AC
  Administered 2020-12-31: 1 mg via INTRAVENOUS
  Filled 2020-12-31: qty 1

## 2020-12-31 NOTE — Telephone Encounter (Signed)
Left message for patient to call back to set up ER f/u

## 2021-01-03 ENCOUNTER — Ambulatory Visit: Payer: Medicaid Other | Admitting: Internal Medicine

## 2021-01-03 MED FILL — SERTRALINE HCL 100 MG TABS: 100 | 30 days supply | Qty: 60 | Fill #2

## 2021-01-03 MED FILL — AZELASTINE HCL 137 MCG/SPRA: 137 | 30 days supply | Qty: 30 | Fill #2

## 2021-01-03 MED FILL — MONTELUKAST SOD 10 MG TAB: 10 | 30 days supply | Qty: 30 | Fill #2

## 2021-01-04 MED FILL — BIKTARVY 50-200-25 MG TABS: 50-200-25 | 30 days supply | Qty: 30 | Fill #2

## 2021-01-04 MED FILL — CROMOLYN 4% EYE DROPS: 4 | 34 days supply | Qty: 10 | Fill #2

## 2021-01-17 MED FILL — SUBVENITE 200 MG TABS: 200 | 30 days supply | Qty: 30 | Fill #2

## 2021-01-17 MED FILL — ACYCLOVIR 400 MG TABLET: 400 | 30 days supply | Qty: 60 | Fill #3

## 2021-01-24 ENCOUNTER — Telehealth: Payer: Self-pay

## 2021-01-24 NOTE — Telephone Encounter (Signed)
Patient needs appt

## 2021-01-27 NOTE — Progress Notes (Signed)
Office Visit Note  Patient: Rachel Underwood             Date of Birth: 1971/11/25           MRN: [id number]             PCP: Janora Norlander, DO Referring: Janora Norlander, DO Visit Date: 01/28/2021   Subjective:  New Patient (Initial Visit) (Positive lupus?)   History of Present Illness: Rachel Underwood is a 50 y.o. female with history of HIV on ART, schizophrenia, HTN, chronic allergic rhinitis here for evaluation of positive ANA and arthralgia of multiple sites. This problem started years ago and she was previously suspected to have RA many years ago, more recently seen with [facility] rheumatology few years ago but FMS was suspected due to lack of observable inflammatory changes. She has been feeling joint pains and fatigue in numerous sites since many years ago but worsening in the past few months. She also complains of severe photosensitivity with rashes and blistering after no significant amount of time in the sun. She also has symptoms with headaches, IBS, episodic vertigo, and difficulty with concentration. She has never taken medications for an inflammatory disease or for FMS. Her HIV is well controlled by last ID follow up.   Labs reviewed 11/2020 CBC wnl BMP wnl UA wnl ANA 1 pos, 1 neg Centromere Abs 1.5 Chromatin neg Jo-1 neg Complement C3 C4 wnl   Activities of Daily Living:  Patient reports morning stiffness for 12 hours.   Patient Reports nocturnal pain.  Difficulty dressing/grooming: Reports Difficulty climbing stairs: Reports Difficulty getting out of chair: Reports Difficulty using hands for taps, buttons, cutlery, and/or writing: Reports  Review of Systems  Constitutional: Positive for fatigue.  HENT: Positive for mouth dryness.   Eyes: Positive for dryness.  Respiratory: Positive for shortness of breath.   Cardiovascular: Positive for swelling in legs/feet.  Gastrointestinal: Positive for constipation.  Endocrine: Positive for cold intolerance, heat  intolerance, excessive thirst and increased urination.  Genitourinary: Negative for difficulty urinating.  Musculoskeletal: Positive for arthralgias, gait problem, joint pain, joint swelling, muscle weakness, morning stiffness and muscle tenderness.  Skin: Negative for rash.  Allergic/Immunologic: Negative for susceptible to infections.  Neurological: Positive for numbness and weakness.  Hematological: Positive for bruising/bleeding tendency.  Psychiatric/Behavioral: Positive for sleep disturbance.    PMFS History:  Patient Active Problem List   Diagnosis Date Noted  . Positive ANA (antinuclear antibody) 01/28/2021  . Arthritis 01/28/2021  . Rash 01/28/2021  . Fibromyalgia syndrome 01/28/2021  . Bilateral carpal tunnel syndrome 02/17/2016  . Allergic rhinitis due to pollen 02/17/2016  . Headache 11/09/2015  . Chronic back pain 11/09/2015  . Diarrhea 05/19/2015  . Cysts of both ovaries 05/12/2015  . Abdominal wall hernia 02/03/2015  . Abdominal wall mass of periumbilical region 87/57/9728  . Schatzki's ring   . Diverticulosis of colon without hemorrhage   . Other hemorrhoids   . Rectal bleeding 12/11/2014  . GERD (gastroesophageal reflux disease) 12/11/2014  . Esophageal dysphagia 12/11/2014  . Constipation 12/11/2014  . S/P hysterectomy 09/01/2014  . Pelvic pain in female 07/22/2014  . Anovulation 06/18/2014  . DUB (dysfunctional uterine bleeding) 06/18/2014  . Menorrhagia with irregular cycle 06/03/2014  . Enlarged uterus 06/03/2014  . HTN (hypertension) 01/15/2014  . Human immunodeficiency virus (HIV) disease ([place]) 07/17/2013  . Schizophrenia ([place]) 07/17/2013  . Bipolar disorder, unspecified ([place]) 07/17/2013    Past Medical History:  Diagnosis Date  . Abnormal Pap smear of  cervix 2005(EST)   ckc for abnl pap- [facility] [place]  . Allergy   . Asthma   . Bipolar 1 disorder ([place])   . Borderline personality disorder ([place])   . BPD (bronchopulmonary  dysplasia)   . Carpal tunnel syndrome    bilateral wrist  . Depression   . Dysmenorrhea 06/03/2014  . Enlarged uterus 06/03/2014  . Fibromyalgia   . GERD (gastroesophageal reflux disease)   . Herpes simplex without mention of complication   . HIV (human immunodeficiency virus infection) ([place])   . Hyperlipidemia   . Hypertension   . Menorrhagia with irregular cycle 06/03/2014  . Osteoarthritis   . Seizures ([place])    had as a teenager, unknown etiology and no meds.    Family History  Problem Relation Age of Onset  . Fibroids Mother   . Hypertension Mother   . Depression Mother   . Irritable bowel syndrome Mother   . Arthritis Mother   . Heart disease Mother   . Mental illness Mother   . Stroke Mother   . Vision loss Mother   . Lupus Mother   . Hypertension Father   . Mental illness Father   . Arthritis Father   . Heart disease Father   . Vision loss Father   . Heart disease Maternal Grandmother   . Cancer Maternal Grandmother        breast  . Depression Maternal Grandmother   . Hyperlipidemia Maternal Grandmother   . Hypertension Maternal Grandmother   . Bipolar disorder Daughter   . Schizophrenia Daughter   . Lupus Daughter   . Cancer Paternal Grandfather        prostate  . Heart disease Paternal Grandfather   . Hyperlipidemia Paternal Grandfather   . Hypertension Paternal Grandfather   . Dementia Paternal Grandmother   . Hyperlipidemia Paternal Grandmother   . Hypertension Paternal Grandmother   . Heart disease Maternal Grandfather   . Alzheimer's disease Maternal Grandfather   . Hyperlipidemia Maternal Grandfather   . Hypertension Maternal Grandfather   . Anxiety disorder Daughter   . ADD / ADHD Son   . Hypertension Brother   . Early death Brother   . Learning disabilities Brother   . Mental illness Brother   . Colon cancer Neg Hx    Past Surgical History:  Procedure Laterality Date  . ABDOMINAL HYSTERECTOMY N/A 09/01/2014   Procedure: HYSTERECTOMY  ABDOMINAL;  Surgeon: Jonnie Kind, MD;  Location: [facility];  Service: Gynecology;  Laterality: N/A;  . ANKLE SURGERY Right    repair of ligaments  . BACK SURGERY    . BILATERAL SALPINGECTOMY Bilateral 09/01/2014   Procedure: BILATERAL SALPINGECTOMY;  Surgeon: Jonnie Kind, MD;  Location: [facility];  Service: Gynecology;  Laterality: Bilateral;  . CESAREAN SECTION    . ckc    . COLONOSCOPY N/A 12/23/2014   GUY:QIHK canal hemorrhoids left-sided diverticula  . ESOPHAGOGASTRODUODENOSCOPY N/A 12/23/2014   VQQ:VZDGLOV reflux/noncritical shatzkis ring s/p dilation/small HH  . EXCISION OF SKIN TAG N/A 09/01/2014   Procedure: EXCISION OF SKIN NEVUS;  Surgeon: Jonnie Kind, MD;  Location: [facility];  Service: Gynecology;  Laterality: N/A;  . FRACTURE SURGERY    . MALONEY DILATION N/A 12/23/2014   Procedure: Venia Minks DILATION;  Surgeon: Daneil Dolin, MD;  Location: [facility];  Service: Endoscopy;  Laterality: N/A;  . SAVORY DILATION N/A 12/23/2014   Procedure: SAVORY DILATION;  Surgeon: Daneil Dolin, MD;  Location: [facility];  Service: Endoscopy;  Laterality: N/A;  . SCAR REVISION N/A 09/01/2014   Procedure: EXCISION OF CICATRIX;  Surgeon: Jonnie Kind, MD;  Location: [facility];  Service: Gynecology;  Laterality: N/A;  . SHOULDER SURGERY Right    rotator cuff  . SPINE SURGERY     Social History   Social History Narrative  . Not on file   Immunization History  Administered Date(s) Administered  . DTaP 12/04/2008  . Hepatitis A 12/04/2006  . Hepatitis B 12/04/2006  . Influenza Split 09/03/2012  . Influenza,inj,Quad PF,6+ Mos 01/07/2014, 09/02/2014, 10/24/2019, 09/23/2020  . Influenza-Unspecified 09/04/2015, 10/08/2017, 10/07/2018  . MMR 12/04/2006  . Moderna Sars-Covid-2 Vaccination 10/04/2020  . PPD Test 07/15/2013  . Pneumococcal Polysaccharide-23 09/03/2012  . Td 09/03/2012  . Tdap 06/05/2017     Objective: Vital Signs: BP (!) 142/80 (BP Location: Right Arm, Patient  Position: Sitting, Cuff Size: Normal)   Pulse 76   Resp 16   Ht 5' 5.5" (1.664 m)   Wt 261 lb (118.4 kg)   LMP 08/11/2014   BMI 42.77 kg/m    Physical Exam Constitutional:      Appearance: She is obese.  HENT:     Right Ear: External ear normal.     Left Ear: External ear normal.     Mouth/Throat:     Mouth: Mucous membranes are moist.     Pharynx: Oropharynx is clear.  Eyes:     Conjunctiva/sclera: Conjunctivae normal.  Cardiovascular:     Rate and Rhythm: Normal rate and regular rhythm.  Pulmonary:     Effort: Pulmonary effort is normal.     Breath sounds: Normal breath sounds.  Skin:    General: Skin is warm and dry.     Comments: Very dry skin with some hyperkeratosis and splitting over distal extensor surfaces and soles Diffuse blanchable erythema of skin  Neurological:     General: No focal deficit present.     Mental Status: She is alert.  Psychiatric:        Mood and Affect: Mood normal.     Musculoskeletal Exam:  Neck full ROM paraspinal muscle tenderness extends up neck down to between scapulae and across trapezius muscles Shoulders full ROM, stiff, no swelling Elbows full ROM no tenderness or swelling Wrists full ROM no tenderness or swelling Fingers full ROM no tenderness or swelling Knees full ROM bilateral crepitus present medial joint line tenderness and pain with valgus and varus pressure with no laxity Ankles full ROM no tenderness or swelling, trace edema above with few petechiae   Investigation: No additional findings.  Imaging: No results found.  Recent Labs: Lab Results  Component Value Date   WBC 8.0 11/10/2020   HGB 13.2 11/10/2020   PLT 234 11/10/2020   NA 136 11/10/2020   K 3.8 11/10/2020   CL 103 11/10/2020   CO2 27 11/10/2020   GLUCOSE 100 (H) 11/10/2020   BUN 10 11/10/2020   CREATININE 0.64 11/10/2020   BILITOT 0.3 09/23/2020   ALKPHOS 54 05/10/2016   AST 16 09/23/2020   ALT 12 09/23/2020   PROT 6.7 09/23/2020   ALBUMIN  3.5 (L) 05/10/2016   CALCIUM 8.9 11/10/2020   GFRAA 88 09/23/2020    Speciality Comments: No specialty comments available.  Procedures:  No procedures performed Allergies: Flecainide, Naproxen, Piroxicam, Abilify [aripiprazole], Citalopram, Codeine, Depakote [divalproex sodium], Gabapentin, Haldol [haloperidol lactate], Haloperidol, Indomethacin, Invega [paliperidone], Latuda [lurasidone hcl], Lithium, Lurasidone, Promethazine, Trazodone and nefazodone, and Lexapro [escitalopram oxalate]  Assessment / Plan:     Visit Diagnoses: Positive ANA (antinuclear antibody) Arthritis Rash  Positive ANA with photosensitive skin rashes and diffuse arthralgias may be consistent with CTD. No specific inflammatory changes seen on exam today. She had a mixed past testing with some positive some negative ANA. Will send for [facility] CTD panel collection for a more definitive answer on this. If negative would not treat empirically as inflammatory process.  Fibromyalgia syndrome  Definitely consistent symptoms with FMS including fatigue, pain and sometimes allodynia, IBS, headaches, dizziness. Will discuss treatment at f/u especially if not starting antiiinflammatory medication.  HIV  Appears well controlled she is adherent to ARTs. Will need to consider medication interaction and infection risk with DMARD options if indicated.  Orders: No orders of the defined types were placed in this encounter.  No orders of the defined types were placed in this encounter.    Follow-Up Instructions: Return in about 3 weeks (around 02/18/2021) for  New pt f/u [facility].   Collier Salina, MD  Note - This record has been created using [employer].  Chart creation errors have been sought, but may not always  have been located. Such creation errors do not reflect on  the standard of medical care.

## 2021-01-28 ENCOUNTER — Ambulatory Visit (INDEPENDENT_AMBULATORY_CARE_PROVIDER_SITE_OTHER): Payer: Medicaid Other | Admitting: Internal Medicine

## 2021-01-28 ENCOUNTER — Other Ambulatory Visit: Payer: Self-pay

## 2021-01-28 ENCOUNTER — Encounter: Payer: Self-pay | Admitting: Internal Medicine

## 2021-01-28 VITALS — BP 142/80 | HR 76 | Resp 16 | Ht 65.5 in | Wt 261.0 lb

## 2021-01-28 DIAGNOSIS — M1990 Unspecified osteoarthritis, unspecified site: Secondary | ICD-10-CM

## 2021-01-28 DIAGNOSIS — R21 Rash and other nonspecific skin eruption: Secondary | ICD-10-CM | POA: Insufficient documentation

## 2021-01-28 DIAGNOSIS — M797 Fibromyalgia: Secondary | ICD-10-CM

## 2021-01-28 DIAGNOSIS — B20 Human immunodeficiency virus [HIV] disease: Secondary | ICD-10-CM | POA: Diagnosis not present

## 2021-01-28 DIAGNOSIS — R768 Other specified abnormal immunological findings in serum: Secondary | ICD-10-CM | POA: Diagnosis not present

## 2021-01-28 NOTE — Patient Instructions (Signed)
Myofascial Pain Syndrome and Fibromyalgia Myofascial pain syndrome and fibromyalgia are both pain disorders. This pain may be felt mainly in your muscles.  Myofascial pain syndrome: ? Always has tender points in the muscle that will cause pain when pressed (trigger points). The pain may come and go. ? Usually affects your neck, upper back, and shoulder areas. The pain often radiates into your arms and hands.  Fibromyalgia: ? Has muscle pains and tenderness that come and go. ? Is often associated with fatigue and sleep problems. ? Has trigger points. ? Tends to be long-lasting (chronic), but is not life-threatening. Fibromyalgia and myofascial pain syndrome are not the same. However, they often occur together. If you have both conditions, each can make the other worse. Both are common and can cause enough pain and fatigue to make day-to-day activities difficult. Both can be hard to diagnose because their symptoms are common in many other conditions. What are the causes? The exact causes of these conditions are not known. What increases the risk? You are more likely to develop this condition if:  You have a family history of the condition.  You have certain triggers, such as: ? Spine disorders. ? An injury (trauma) or other physical stressors. ? Being under a lot of stress. ? Medical conditions such as osteoarthritis, rheumatoid arthritis, or lupus. What are the signs or symptoms? Fibromyalgia The main symptom of fibromyalgia is widespread pain and tenderness in your muscles. Pain is sometimes described as stabbing, shooting, or burning. You may also have:  Tingling or numbness.  Sleep problems and fatigue.  Problems with attention and concentration (fibro fog). Other symptoms may include:  Bowel and bladder problems.  Headaches.  Visual problems.  Problems with odors and noises.  Depression or mood changes.  Painful menstrual periods (dysmenorrhea).  Dry skin or  eyes. These symptoms can vary over time. Myofascial pain syndrome Symptoms of myofascial pain syndrome include:  Tight, ropy bands of muscle.  Uncomfortable sensations in muscle areas. These may include aching, cramping, burning, numbness, tingling, and weakness.  Difficulty moving certain parts of the body freely (poor range of motion). How is this diagnosed? This condition may be diagnosed by your symptoms and medical history. You will also have a physical exam. In general:  Fibromyalgia is diagnosed if you have pain, fatigue, and other symptoms for more than 3 months, and symptoms cannot be explained by another condition.  Myofascial pain syndrome is diagnosed if you have trigger points in your muscles, and those trigger points are tender and cause pain elsewhere in your body (referred pain). How is this treated? Treatment for these conditions depends on the type that you have.  For fibromyalgia: ? Pain medicines, such as NSAIDs. ? Medicines for treating depression. ? Medicines for treating seizures. ? Medicines that relax the muscles.  For myofascial pain: ? Pain medicines, such as NSAIDs. ? Cooling and stretching of muscles. ? Trigger point injections. ? Sound wave (ultrasound) treatments to stimulate muscles. Treating these conditions often requires a team of health care providers. These may include:  Your primary care provider.  Physical therapist.  Complementary health care providers, such as massage therapists or acupuncturists.  Psychiatrist for cognitive behavioral therapy.   Follow these instructions at home: Medicines  Take over-the-counter and prescription medicines only as told by your health care provider.  Do not drive or use heavy machinery while taking prescription pain medicine.  If you are taking prescription pain medicine, take actions to prevent or treat constipation.  Your health care provider may recommend that you: ? Drink enough fluid to keep  your urine pale yellow. ? Eat foods that are high in fiber, such as fresh fruits and vegetables, whole grains, and beans. ? Limit foods that are high in fat and processed sugars, such as fried or sweet foods. ? Take an over-the-counter or prescription medicine for constipation. Lifestyle  Exercise as directed by your health care provider or physical therapist.  Practice relaxation techniques to control your stress. You may want to try: ? Biofeedback. ? Visual imagery. ? Hypnosis. ? Muscle relaxation. ? Yoga. ? Meditation.  Maintain a healthy lifestyle. This includes eating a healthy diet and getting enough sleep.  Do not use any products that contain nicotine or tobacco, such as cigarettes and e-cigarettes. If you need help quitting, ask your health care provider.   General instructions  Talk to your health care provider about complementary treatments, such as acupuncture or massage.  Consider joining a support group with others who are diagnosed with this condition.  Do not do activities that stress or strain your muscles. This includes repetitive motions and heavy lifting.  Keep all follow-up visits as told by your health care provider. This is important. Where to find more information  National Fibromyalgia Association: www.fmaware.[place]: www.arthritis.org  American Chronic Pain Association: www.theacpa.org Contact a health care provider if:  You have new symptoms.  Your symptoms get worse or your pain is severe.  You have side effects from your medicines.  You have trouble sleeping.  Your condition is causing depression or anxiety. Summary  Myofascial pain syndrome and fibromyalgia are pain disorders.  Myofascial pain syndrome has tender points in the muscle that will cause pain when pressed (trigger points). Fibromyalgia also has muscle pains and tenderness that come and go, but this condition is often associated with fatigue and sleep  disturbances.  Fibromyalgia and myofascial pain syndrome are not the same but often occur together, causing pain and fatigue that make day-to-day activities difficult.  Treatment for fibromyalgia includes taking medicines to relax the muscles and medicines for pain, depression, or seizures. Treatment for myofascial pain syndrome includes taking medicines for pain, cooling and stretching of muscles, and injecting medicines into trigger points.  Follow your health care provider's instructions for taking medicines and maintaining a healthy lifestyle.     Antinuclear Antibody Test Why am I having this test? This is a test that is used to help diagnose systemic lupus erythematosus (SLE) and other autoimmune diseases. An autoimmune disease is a disease in which the body's own defense (immune)system attacks its organs. What is being tested? This test checks for antinuclear antibodies (ANA) in the blood. The presence of ANA is associated with several autoimmune diseases. It is seen in almost all patients with lupus. What kind of sample is taken? A blood sample is required for this test. It is usually collected by inserting a needle into a blood vessel.   How are the results reported? Your test results will be reported as either positive or negative. A false-positive result can occur. A false positive is incorrect because it means that a condition is present when it is not. What do the results mean? A positive test result may mean that you have:  Lupus.  Other autoimmune diseases, such as rheumatoid arthritis, scleroderma, or Sjgren syndrome. Conditions that may cause a false-positive result include:  Liver dysfunction.  Myasthenia gravis.  Infectious mononucleosis. Talk with your health care provider about what your  results mean. Questions to ask your health care provider Ask your health care provider, or the department that is doing the test:  When will my results be ready?  How  will I get my results?  What are my treatment options?  What other tests do I need?  What are my next steps? Summary  This is a test that is used to help diagnose systemic lupus erythematosus (SLE) and other autoimmune diseases. An autoimmune disease is a disease in which the body's own defense (immune)system attacks the body.  This test checks for antinuclear antibodies (ANA) in the blood. The presence of ANA is associated with several autoimmune diseases. It is seen in almost all patients with lupus.  Your test results will be reported as either positive or negative. Talk with your health care provider about what your results mean.

## 2021-01-31 ENCOUNTER — Ambulatory Visit: Payer: Medicaid Other | Admitting: Internal Medicine

## 2021-02-01 ENCOUNTER — Other Ambulatory Visit: Payer: Self-pay

## 2021-02-01 ENCOUNTER — Encounter: Payer: Self-pay | Admitting: Nurse Practitioner

## 2021-02-01 ENCOUNTER — Ambulatory Visit (INDEPENDENT_AMBULATORY_CARE_PROVIDER_SITE_OTHER): Payer: Medicaid Other | Admitting: Nurse Practitioner

## 2021-02-01 VITALS — BP 110/77 | HR 77 | Temp 97.2°F | Ht 65.5 in | Wt 259.4 lb

## 2021-02-01 DIAGNOSIS — I10 Essential (primary) hypertension: Secondary | ICD-10-CM | POA: Diagnosis not present

## 2021-02-01 DIAGNOSIS — Z720 Tobacco use: Secondary | ICD-10-CM

## 2021-02-01 DIAGNOSIS — B070 Plantar wart: Secondary | ICD-10-CM | POA: Diagnosis not present

## 2021-02-01 NOTE — Assessment & Plan Note (Signed)
Patient would like to start tobacco cessation.  Provided education on different options available to help with process.  Patient prefers to use Chantix as she did better in the past.  Chantix currently on  recall in the [place] and advised patient to please look into other options.

## 2021-02-01 NOTE — Patient Instructions (Signed)
Managing the Challenge of Quitting Smoking Quitting smoking is a physical and mental challenge. You will face cravings, withdrawal symptoms, and temptation. Before quitting, work with your health care provider to make a plan that can help you manage quitting. Preparation can help you quit and keep you from giving in. How to manage lifestyle changes Managing stress Stress can make you want to smoke, and wanting to smoke may cause stress. It is important to find ways to manage your stress. You might try some of the following:  Practice relaxation techniques. ? Breathe slowly and deeply, in through your nose and out through your mouth. ? Listen to music. ? Soak in a bath or take a shower. ? Imagine a peaceful place or vacation.  Get some support. ? Talk with family or friends about your stress. ? Join a support group. ? Talk with a counselor or therapist.  Get some physical activity. ? Go for a walk, run, or bike ride. ? Play a favorite sport. ? Practice yoga.   Medicines Talk with your health care provider about medicines that might help you deal with cravings and make quitting easier for you. Relationships Social situations can be difficult when you are quitting smoking. To manage this, you can:  Avoid parties and other social situations where people might be smoking.  Avoid alcohol.  Leave right away if you have the urge to smoke.  Explain to your family and friends that you are quitting smoking. Ask for support and let them know you might be a bit grumpy.  Plan activities where smoking is not an option. General instructions Be aware that many people gain weight after they quit smoking. However, not everyone does. To keep from gaining weight, have a plan in place before you quit and stick to the plan after you quit. Your plan should include:  Having healthy snacks. When you have a craving, it may help to: ? Eat popcorn, carrots, celery, or other cut vegetables. ? Chew  sugar-free gum.  Changing how you eat. ? Eat small portion sizes at meals. ? Eat 4-6 small meals throughout the day instead of 1-2 large meals a day. ? Be mindful when you eat. Do not watch television or do other things that might distract you as you eat.  Exercising regularly. ? Make time to exercise each day. If you do not have time for a long workout, do short bouts of exercise for 5-10 minutes several times a day. ? Do some form of strengthening exercise, such as weight lifting. ? Do some exercise that gets your heart beating and causes you to breathe deeply, such as walking fast, running, swimming, or biking. This is very important.  Drinking plenty of water or other low-calorie or no-calorie drinks. Drink 6-8 glasses of water daily.   How to recognize withdrawal symptoms Your body and mind may experience discomfort as you try to get used to not having nicotine in your system. These effects are called withdrawal symptoms. They may include:  Feeling hungrier than normal.  Having trouble concentrating.  Feeling irritable or restless.  Having trouble sleeping.  Feeling depressed.  Craving a cigarette. To manage withdrawal symptoms:  Avoid places, people, and activities that trigger your cravings.  Remember why you want to quit.  Get plenty of sleep.  Avoid coffee and other caffeinated drinks. These may worsen some of your symptoms. These symptoms may surprise you. But be assured that they are normal to have when quitting smoking. How to manage cravings   Come up with a plan for how to deal with your cravings. The plan should include the following:  A definition of the specific situation you want to deal with.  An alternative action you will take.  A clear idea for how this action will help.  The name of someone who might help you with this. Cravings usually last for 5-10 minutes. Consider taking the following actions to help you with your plan to deal with  cravings:  Keep your mouth busy. ? Chew sugar-free gum. ? Suck on hard candies or a straw. ? Brush your teeth.  Keep your hands and body busy. ? Change to a different activity right away. ? Squeeze or play with a ball. ? Do an activity or a hobby, such as making bead jewelry, practicing needlepoint, or working with wood. ? Mix up your normal routine. ? Take a short exercise break. Go for a quick walk or run up and down stairs.  Focus on doing something kind or helpful for someone else.  Call a friend or family member to talk during a craving.  Join a support group.  Contact a quitline. Where to find support To get help or find a support group:  Call the [facility]'s Smoking Quitline: 1-800-QUIT NOW (339)227-5318)  Visit the website of the Substance Abuse and [place]: ktimeonline.com  Text QUIT to SmokefreeTXT: 542706 Where to find more information Visit these websites to find more information on quitting smoking:  [place]: www.smokefree.gov  American Lung Association: www.lung.org  American Cancer Society: www.cancer.org  Centers for Disease Control and Prevention: http://www.wolf.info/  American Heart Association: www.heart.org Contact a health care provider if:  You want to change your plan for quitting.  The medicines you are taking are not helping.  Your eating feels out of control or you cannot sleep. Get help right away if:  You feel depressed or become very anxious. Summary  Quitting smoking is a physical and mental challenge. You will face cravings, withdrawal symptoms, and temptation to smoke again. Preparation can help you as you go through these challenges.  Try different techniques to manage stress, handle social situations, and prevent weight gain.  You can deal with cravings by keeping your mouth busy (such as by chewing gum), keeping your hands and body busy, calling family or friends, or  contacting a quitline for people who want to quit smoking.  You can deal with withdrawal symptoms by avoiding places where people smoke, getting plenty of rest, and avoiding drinks with caffeine. This information is not intended to replace advice given to you by your health care provider. Make sure you discuss any questions you have with your health care provider. Document Revised: 09/09/2019 Document Reviewed: 09/09/2019 Elsevier Patient Education  2021 [place].   Hypertension, Adult Hypertension is another name for high blood pressure. High blood pressure forces your heart to work harder to pump blood. This can cause problems over time. There are two numbers in a blood pressure reading. There is a top number (systolic) over a bottom number (diastolic). It is best to have a blood pressure that is below 120/80. Healthy choices can help lower your blood pressure, or you may need medicine to help lower it. What are the causes? The cause of this condition is not known. Some conditions may be related to high blood pressure. What increases the risk?  Smoking.  Having type 2 diabetes mellitus, high cholesterol, or both.  Not getting enough exercise or physical  activity.  Being overweight.  Having too much fat, sugar, calories, or salt (sodium) in your diet.  Drinking too much alcohol.  Having long-term (chronic) kidney disease.  Having a family history of high blood pressure.  Age. Risk increases with age.  Race. You may be at higher risk if you are African American.  Gender. Men are at higher risk than women before age 13. After age 52, women are at higher risk than men.  Having obstructive sleep apnea.  Stress. What are the signs or symptoms?  High blood pressure may not cause symptoms. Very high blood pressure (hypertensive crisis) may cause: ? Headache. ? Feelings of worry or nervousness (anxiety). ? Shortness of breath. ? Nosebleed. ? A feeling of being sick to  your stomach (nausea). ? Throwing up (vomiting). ? Changes in how you see. ? Very bad chest pain. ? Seizures. How is this treated?  This condition is treated by making healthy lifestyle changes, such as: ? Eating healthy foods. ? Exercising more. ? Drinking less alcohol.  Your health care provider may prescribe medicine if lifestyle changes are not enough to get your blood pressure under control, and if: ? Your top number is above 130. ? Your bottom number is above 80.  Your personal target blood pressure may vary. Follow these instructions at home: Eating and drinking  If told, follow the DASH eating plan. To follow this plan: ? Fill one half of your plate at each meal with fruits and vegetables. ? Fill one fourth of your plate at each meal with whole grains. Whole grains include whole-wheat pasta, brown rice, and whole-grain bread. ? Eat or drink low-fat dairy products, such as skim milk or low-fat yogurt. ? Fill one fourth of your plate at each meal with low-fat (lean) proteins. Low-fat proteins include fish, chicken without skin, eggs, beans, and tofu. ? Avoid fatty meat, cured and processed meat, or chicken with skin. ? Avoid pre-made or processed food.  Eat less than 1,500 mg of salt each day.  Do not drink alcohol if: ? Your doctor tells you not to drink. ? You are pregnant, may be pregnant, or are planning to become pregnant.  If you drink alcohol: ? Limit how much you use to:  0-1 drink a day for women.  0-2 drinks a day for men. ? Be aware of how much alcohol is in your drink. In the [place] one drink equals one 12 oz bottle of beer (355 mL), one 5 oz glass of wine (148 mL), or one 1 oz glass of hard liquor (44 mL).   Lifestyle  Work with your doctor to stay at a healthy weight or to lose weight. Ask your doctor what the best weight is for you.  Get at least 30 minutes of exercise most days of the week. This may include walking, swimming, or biking.  Get at  least 30 minutes of exercise that strengthens your muscles (resistance exercise) at least 3 days a week. This may include lifting weights or doing Pilates.  Do not use any products that contain nicotine or tobacco, such as cigarettes, e-cigarettes, and chewing tobacco. If you need help quitting, ask your doctor.  Check your blood pressure at home as told by your doctor.  Keep all follow-up visits as told by your doctor. This is important.   Medicines  Take over-the-counter and prescription medicines only as told by your doctor. Follow directions carefully.  Do not skip doses of blood pressure medicine. The medicine does  not work as well if you skip doses. Skipping doses also puts you at risk for problems.  Ask your doctor about side effects or reactions to medicines that you should watch for. Contact a doctor if you:  Think you are having a reaction to the medicine you are taking.  Have headaches that keep coming back (recurring).  Feel dizzy.  Have swelling in your ankles.  Have trouble with your vision. Get help right away if you:  Get a very bad headache.  Start to feel mixed up (confused).  Feel weak or numb.  Feel faint.  Have very bad pain in your: ? Chest. ? Belly (abdomen).  Throw up more than once.  Have trouble breathing. Summary  Hypertension is another name for high blood pressure.  High blood pressure forces your heart to work harder to pump blood.  For most people, a normal blood pressure is less than 120/80.  Making healthy choices can help lower blood pressure. If your blood pressure does not get lower with healthy choices, you may need to take medicine. This information is not intended to replace advice given to you by your health care provider. Make sure you discuss any questions you have with your health care provider. Document Revised: 07/31/2018 Document Reviewed: 07/31/2018 Elsevier Patient Education  2021 [place]. Warts  Warts are  small growths on the skin. They are common, and they are caused by a virus. Warts can be found on many parts of the body. A person may have one wart or many warts. Most warts will go away on their own with time, but this could take many months to a few years. Treatments may be done if needed. What are the causes? Warts are caused by a type of virus that is called HPV.  This virus can spread from person to person through touching.  Warts can also spread to other parts of the body when a person scratches a wart and then scratches normal skin. What increases the risk? You are more likely to get warts if:  You are 41-74 years old.  You have a weak body defense system (immune system).  You are Caucasian. What are the signs or symptoms? The main symptom of this condition is small growths on the skin. Warts may:  Be round, oval, or have an uneven shape.  Feel rough to the touch.  Be the color of your skin or light yellow, brown, or gray.  Often be less than  inch (1.3 cm) in size.  Go away and then come back again. Most warts do not hurt, but some can hurt if they are large or if they are on the bottom of your feet. How is this diagnosed? A wart can often be diagnosed by how it looks. In some cases, the doctor might remove a little bit of the wart to test it (biopsy). How is this treated? Most of the time, warts do not need treatment. Sometimes people want warts removed. If treatment is needed or wanted, options may include:  Putting creams or patches with medicine in them on the wart.  Putting duct tape over the top of the wart.  Freezing the wart.  Burning the wart with: ? A laser. ? An electric probe.  Giving a shot of medicine into the wart to help the body's defense system fight off the wart.  Surgery to remove the wart. Follow these instructions at home: Medicines  Apply over-the-counter and prescription medicines only as told by your  doctor.  Do not apply  over-the-counter wart medicines to your face or genitals before you ask your doctor if it is okay to do that. Lifestyle  Keep your body's defense system healthy. To do this: ? Eat a healthy diet. ? Get enough sleep. ? Do not use any products that contain nicotine or tobacco, such as cigarettes and e-cigarettes. If you need help quitting, ask your doctor. General instructions  Wash your hands after you touch a wart.  Do not scratch or pick at a wart.  Avoid shaving hair that is over a wart.  Keep all follow-up visits as told by your doctor. This is important.   Contact a doctor if:  Your warts do not get better after treatment.  You have redness, swelling, or pain at the site of a wart.  You have bleeding from a wart, and the bleeding does not stop when you put light pressure on the wart.  You have diabetes and you get a wart. Summary  Warts are small growths on the skin. They are common, and they are caused by a virus.  Most of the time, warts do not need treatment. Sometimes people want warts removed. If treatment is needed or wanted, there are many options.  Apply over-the-counter and prescription medicines only as told by your doctor.  Wash your hands after you touch a wart.  Keep all follow-up visits as told by your doctor. This is important. This information is not intended to replace advice given to you by your health care provider. Make sure you discuss any questions you have with your health care provider. Document Revised: 04/09/2018 Document Reviewed: 04/09/2018 Elsevier Patient Education  [place].

## 2021-02-01 NOTE — Progress Notes (Signed)
Established Patient Office Visit  Subjective:  Patient ID: Rachel Underwood, female    DOB: 28-Feb-1971  Age: 50 y.o. MRN: [id number]  CC:  Chief Complaint  Patient presents with  . Foot Problem    RIGHT    HPI Rachel Underwood presents for planter wart.  This is not new for patient in the last 3 years.  Warts has increased in size, no pain reported itching or bleeding.  Patient would like work to be evaluated and completely removed.    Pt presents for follow up of hypertension. Patient was diagnosed in .  2015 the patient is tolerating the medication well without side effects. Compliance with treatment has been good; including taking medication as directed , maintains a healthy diet and regular exercise regimen , and following up as directed.  Current medication is atenolol 25 mg daily.  Past Medical History:  Diagnosis Date  . Abnormal Pap smear of cervix 2005(EST)   ckc for abnl pap- [facility] [place]  . Allergy   . Asthma   . Bipolar 1 disorder ([place])   . Borderline personality disorder ([place])   . BPD (bronchopulmonary dysplasia)   . Carpal tunnel syndrome    bilateral wrist  . Depression   . Dysmenorrhea 06/03/2014  . Enlarged uterus 06/03/2014  . Fibromyalgia   . GERD (gastroesophageal reflux disease)   . Herpes simplex without mention of complication   . HIV (human immunodeficiency virus infection) ([place])   . Hyperlipidemia   . Hypertension   . Menorrhagia with irregular cycle 06/03/2014  . Osteoarthritis   . Seizures ([place])    had as a teenager, unknown etiology and no meds.    Past Surgical History:  Procedure Laterality Date  . ABDOMINAL HYSTERECTOMY N/A 09/01/2014   Procedure: HYSTERECTOMY ABDOMINAL;  Surgeon: Jonnie Kind, MD;  Location: [facility];  Service: Gynecology;  Laterality: N/A;  . ANKLE SURGERY Right    repair of ligaments  . BACK SURGERY    . BILATERAL SALPINGECTOMY Bilateral 09/01/2014   Procedure: BILATERAL SALPINGECTOMY;  Surgeon: Jonnie Kind,  MD;  Location: [facility];  Service: Gynecology;  Laterality: Bilateral;  . CESAREAN SECTION    . ckc    . COLONOSCOPY N/A 12/23/2014   ZHG:DJME canal hemorrhoids left-sided diverticula  . ESOPHAGOGASTRODUODENOSCOPY N/A 12/23/2014   QAS:TMHDQQI reflux/noncritical shatzkis ring s/p dilation/small HH  . EXCISION OF SKIN TAG N/A 09/01/2014   Procedure: EXCISION OF SKIN NEVUS;  Surgeon: Jonnie Kind, MD;  Location: [facility];  Service: Gynecology;  Laterality: N/A;  . FRACTURE SURGERY    . MALONEY DILATION N/A 12/23/2014   Procedure: Venia Minks DILATION;  Surgeon: Daneil Dolin, MD;  [facility];  Service: Endoscopy;  Laterality: N/A;  . SAVORY DILATION N/A 12/23/2014   Procedure: SAVORY DILATION;  Surgeon: Daneil Dolin, MD;  [facility];  Service: Endoscopy;  Laterality: N/A;  . SCAR REVISION N/A 09/01/2014   Procedure: EXCISION OF CICATRIX;  Surgeon: Jonnie Kind, MD;  Location: [facility];  Service: Gynecology;  Laterality: N/A;  . SHOULDER SURGERY Right    rotator cuff  . SPINE SURGERY      Family History  Problem Relation Age of Onset  . Fibroids Mother   . Hypertension Mother   . Depression Mother   . Irritable bowel syndrome Mother   . Arthritis Mother   . Heart disease Mother   . Mental illness Mother   . Stroke Mother   .  Vision loss Mother   . Lupus Mother   . Hypertension Father   . Mental illness Father   . Arthritis Father   . Heart disease Father   . Vision loss Father   . Heart disease Maternal Grandmother   . Cancer Maternal Grandmother        breast  . Depression Maternal Grandmother   . Hyperlipidemia Maternal Grandmother   . Hypertension Maternal Grandmother   . Bipolar disorder Daughter   . Schizophrenia Daughter   . Lupus Daughter   . Cancer Paternal Grandfather        prostate  . Heart disease Paternal Grandfather   . Hyperlipidemia Paternal Grandfather   . Hypertension Paternal Grandfather   . Dementia Paternal Grandmother   .  Hyperlipidemia Paternal Grandmother   . Hypertension Paternal Grandmother   . Heart disease Maternal Grandfather   . Alzheimer's disease Maternal Grandfather   . Hyperlipidemia Maternal Grandfather   . Hypertension Maternal Grandfather   . Anxiety disorder Daughter   . ADD / ADHD Son   . Hypertension Brother   . Early death Brother   . Learning disabilities Brother   . Mental illness Brother   . Colon cancer Neg Hx     Social History   Socioeconomic History  . Marital status: Divorced    Spouse name: Not on file  . Number of children: 3  . Years of education: Not on file  . Highest education level: Not on file  Occupational History  . Not on file  Tobacco Use  . Smoking status: Current Every Day Smoker    Packs/day: 1.00    Years: 30.00    Pack years: 30.00    Types: Cigarettes    Start date: 12/04/1982  . Smokeless tobacco: Never Used  Vaping Use  . Vaping Use: Never used  Substance and Sexual Activity  . Alcohol use: No    Alcohol/week: 0.0 standard drinks  . 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 Sexual activity: Yes    Partners: Male    Birth control/protection: Condom  Other Topics Concern  . Not on file  Social History Narrative  . Not on file   Social Determinants of Health   Financial Resource Strain: Not on file  Food Insecurity: Not on file  Transportation Needs: Not on file  Physical Activity: Not on file  Stress: Not on file  Social Connections: Not on file  Intimate Partner Violence: Not on file    Outpatient Medications Prior to Visit  Medication Sig Dispense Refill  . acyclovir (ZOVIRAX) 400 MG tablet Take 1 tablet (400 mg total) by mouth 2 (two) times daily as needed (for prophylaxis). 60 tablet 5  . albuterol (VENTOLIN HFA) 108 (90 Base) MCG/ACT inhaler Inhale 2 puffs into the lungs every 4 (four) hours as needed for wheezing or shortness  of breath. Reported on 04/06/2016 18 g 5  . atenolol (TENORMIN) 25 MG tablet Take 1 tablet (25 mg total) by mouth daily. 30 tablet 0  . azelastine (ASTELIN) 0.1 % nasal spray Place 1 spray into both nostrils 2 (two) times daily. 30 mL 12  . bictegravir-emtricitabine-tenofovir AF (BIKTARVY) 50-200-25 MG TABS tablet Take 1 tablet by mouth daily. 30 tablet 5  . cromolyn (OPTICROM) 4 % ophthalmic solution 1 drop 2 (two) times daily.    Marland Kitchen  cycloSPORINE (RESTASIS) 0.05 % ophthalmic emulsion     . dexlansoprazole (DEXILANT) 60 MG capsule Take 1 capsule (60 mg total) by mouth daily. 90 capsule 3  . EPINEPHrine 0.3 mg/0.3 mL IJ SOAJ injection Inject 0.3 mg into the muscle as needed for anaphylaxis (then call 911). 1 each 0  . fluconazole (DIFLUCAN) 150 MG tablet Take 1 tablet (150 mg total) by mouth daily. 7 tablet 0  . lamoTRIgine (LAMICTAL) 200 MG tablet Take 1 tablet (200 mg total) by mouth daily. 30 tablet 3  . linaclotide (LINZESS) 290 MCG CAPS capsule Take 1 capsule (290 mcg total) by mouth daily. On empty stomach 30 capsule 2  . montelukast (SINGULAIR) 10 MG tablet Take 1 tablet (10 mg total) by mouth at bedtime. 30 tablet 3  . nystatin (MYCOSTATIN/NYSTOP) powder Apply topically 3 (three) times daily. For up to 2 weeks for rash under breast 30 g 1  . ondansetron (ZOFRAN ODT) 8 MG disintegrating tablet Take 1 tablet (8 mg total) by mouth every 8 (eight) hours as needed for nausea or vomiting. 30 tablet 2   No facility-administered medications prior to visit.    Allergies  Allergen Reactions  . Flecainide Swelling  . Naproxen Anaphylaxis and Hives  . Piroxicam Anaphylaxis  . Abilify [Aripiprazole]   . Citalopram Hives  . Codeine Nausea And Vomiting  . Depakote [Divalproex Sodium] Swelling  . Gabapentin     Hives   . Haldol [Haloperidol Lactate] Other (See Comments)    Slurs speech and "stumble"  . Haloperidol   . Indomethacin Swelling    Dizzy, tigling of face  . Invega [Paliperidone]  Other (See Comments)    Slurred speech, drooling  . Latuda [Lurasidone Hcl] Itching  . Lithium Hives  . Lurasidone   . Promethazine Swelling    Arms and hands  . Trazodone And Nefazodone Other (See Comments)    Dry mouth, congestion, severe dryness.  [name] [Escitalopram Oxalate] Rash    ROS Review of Systems  HENT: Negative.   Eyes: Negative.   Respiratory: Negative.   Cardiovascular: Negative.   Genitourinary: Negative.   Musculoskeletal: Negative.   Skin: Positive for color change.        positive for plantar wart right foot  Psychiatric/Behavioral: Negative.   All other systems reviewed and are negative.     Objective:    Physical Exam Vitals reviewed.  Constitutional:      Appearance: Normal appearance.  HENT:     Head: Normocephalic.     Nose: Nose normal.  Eyes:     Conjunctiva/sclera: Conjunctivae normal.  Cardiovascular:     Rate and Rhythm: Normal rate and regular rhythm.     Pulses: Normal pulses.     Heart sounds: Normal heart sounds.  Pulmonary:     Effort: Pulmonary effort is normal.     Breath sounds: Normal breath sounds.  Abdominal:     General: Bowel sounds are normal.  Skin:    General: Skin is dry.     Comments: Positive for plantar wart right feet  Neurological:     Mental Status: She is alert and oriented to person, place, and time.  Psychiatric:        Mood and Affect: Mood normal.        Behavior: Behavior normal.     BP 110/77   Pulse 77   Temp (!) 97.2 F (36.2 C)   Ht 5' 5.5" (1.664 m)   Wt 259 lb 6.4 oz (117.7  kg)   LMP 08/11/2014   SpO2 94%   BMI 42.51 kg/m  Wt Readings from Last 3 Encounters:  02/01/21 259 lb 6.4 oz (117.7 kg)  01/28/21 261 lb (118.4 kg)  12/30/20 270 lb (122.5 kg)     There are no preventive care reminders to display for this patient.  There are no preventive care reminders to display for this patient.  Lab Results  Component Value Date   TSH 0.489 02/09/2015   Lab Results  Component  Value Date   WBC 8.0 11/10/2020   HGB 13.2 11/10/2020   HCT 40.3 11/10/2020   MCV 98.1 11/10/2020   PLT 234 11/10/2020   Lab Results  Component Value Date   NA 136 11/10/2020   K 3.8 11/10/2020   CO2 27 11/10/2020   GLUCOSE 100 (H) 11/10/2020   BUN 10 11/10/2020   CREATININE 0.64 11/10/2020   BILITOT 0.3 09/23/2020   ALKPHOS 54 05/10/2016   AST 16 09/23/2020   ALT 12 09/23/2020   PROT 6.7 09/23/2020   ALBUMIN 3.5 (L) 05/10/2016   CALCIUM 8.9 11/10/2020   ANIONGAP 6 11/10/2020   Lab Results  Component Value Date   CHOL 201 (H) 06/20/2019   Lab Results  Component Value Date   HDL 48 06/20/2019   Lab Results  Component Value Date   LDLCALC 126 (H) 06/20/2019   Lab Results  Component Value Date   TRIG 133 06/20/2019   Lab Results  Component Value Date   CHOLHDL 4.2 06/20/2019   Lab Results  Component Value Date   HGBA1C 5.4 12/24/2014      Assessment & Plan:   Problem List Items Addressed This Visit      Cardiovascular and Mediastinum   HTN (hypertension)    Hypertension well controlled on current medication.  Current medication atenolol 25 mg tablet by mouth daily.  Continue low-sodium diet and exercise as tolerated.        Musculoskeletal and Integument   Plantar wart - Primary   Relevant Orders   Ambulatory referral to Dermatology     Other   Tobacco abuse disorder    Patient would like to start tobacco cessation.  Provided education on different options available to help with process.  Patient prefers to use Chantix as she did better in the past.  Chantix currently on  recall in the [place] and advised patient to please look into other options.         No orders of the defined types were placed in this encounter.   Follow-up: Return if symptoms worsen or fail to improve.    Ivy Lynn, NP

## 2021-02-01 NOTE — Assessment & Plan Note (Signed)
Hypertension well controlled on current medication.  Current medication atenolol 25 mg tablet by mouth daily.  Continue low-sodium diet and exercise as tolerated.

## 2021-02-03 ENCOUNTER — Telehealth: Payer: Self-pay

## 2021-02-03 NOTE — Telephone Encounter (Signed)
FYI:  Patient called to let Dr. Benjamine Mola know that she is scheduled to have [place] on 02/10/21.  Patient also changed her follow-up appointment from 3/21 to 02/28/21 to allow enough time to receive results.

## 2021-02-08 ENCOUNTER — Ambulatory Visit: Payer: Medicaid Other | Admitting: Internal Medicine

## 2021-02-14 ENCOUNTER — Other Ambulatory Visit: Payer: Self-pay | Admitting: Internal Medicine

## 2021-02-14 ENCOUNTER — Other Ambulatory Visit: Payer: Self-pay

## 2021-02-14 ENCOUNTER — Telehealth (INDEPENDENT_AMBULATORY_CARE_PROVIDER_SITE_OTHER): Payer: Medicaid Other | Admitting: Internal Medicine

## 2021-02-14 DIAGNOSIS — Z8619 Personal history of other infectious and parasitic diseases: Secondary | ICD-10-CM | POA: Diagnosis not present

## 2021-02-14 DIAGNOSIS — J301 Allergic rhinitis due to pollen: Secondary | ICD-10-CM

## 2021-02-14 DIAGNOSIS — F319 Bipolar disorder, unspecified: Secondary | ICD-10-CM

## 2021-02-14 DIAGNOSIS — B20 Human immunodeficiency virus [HIV] disease: Secondary | ICD-10-CM | POA: Diagnosis not present

## 2021-02-14 MED ORDER — ALBUTEROL SULFATE HFA 108 (90 BASE) MCG/ACT IN AERS: 2.0000 | INHALATION_SPRAY | RESPIRATORY_TRACT | 5 refills | Status: DC | PRN

## 2021-02-14 MED ORDER — ACYCLOVIR 400 MG PO TABS: 400.0000 mg | ORAL_TABLET | Freq: Two times a day (BID) | ORAL | 3 refills | Status: DC | PRN

## 2021-02-14 MED ORDER — BIKTARVY 50-200-25 MG PO TABS: 1.0000 | ORAL_TABLET | Freq: Every day | ORAL | 3 refills | Status: DC
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
## 2021-02-21 ENCOUNTER — Ambulatory Visit: Payer: Medicaid Other | Admitting: Internal Medicine

## 2021-02-21 MED FILL — LEVOCETIRIZINE 5 MG TABLET: 5 | 30 days supply | Qty: 30 | Fill #2

## 2021-02-21 MED FILL — SUBVENITE 200 MG TABS: 200 | 30 days supply | Qty: 30 | Fill #3

## 2021-02-27 NOTE — Progress Notes (Deleted)
Office Visit Note  Patient: Rachel Underwood             Date of Birth: September 26, 1971           MRN: [id number]             PCP: Janora Norlander, DO Referring: Janora Norlander, DO Visit Date: 02/28/2021   Subjective:  No chief complaint on file.   History of Present Illness: Rachel Underwood is a 50 y.o. female here for follow up for positive ANA with photosensitive skin rashes and arthralgias without inflammatory joint changes on exam and was recommended for AVISE CTD testing due to inconsistent previous autoantibody test results.***     No Rheumatology ROS completed.    Previous HPI: Rachel Underwood is a 50 y.o. female with history of HIV on ART, schizophrenia, HTN, chronic allergic rhinitis here for evaluation of positive ANA and arthralgia of multiple sites. This problem started years ago and she was previously suspected to have RA many years ago, more recently seen with [facility] rheumatology few years ago but FMS was suspected due to lack of observable inflammatory changes. She has been feeling joint pains and fatigue in numerous sites since many years ago but worsening in the past few months. She also complains of severe photosensitivity with rashes and blistering after no significant amount of time in the sun. She also has symptoms with headaches, IBS, episodic vertigo, and difficulty with concentration. She has never taken medications for an inflammatory disease or for FMS. Her HIV is well controlled by last ID follow up.   Labs reviewed 11/2020 CBC wnl BMP wnl UA wnl ANA 1 pos, 1 neg Centromere Abs 1.5 Chromatin neg Jo-1 neg Complement C3 C4 wnl   PMFS History:  Patient Active Problem List   Diagnosis Date Noted  . Plantar wart 02/01/2021  . Tobacco abuse disorder 02/01/2021  . Positive ANA (antinuclear antibody) 01/28/2021  . Arthritis 01/28/2021  . Rash 01/28/2021  . Fibromyalgia syndrome 01/28/2021  . Bilateral carpal tunnel syndrome 02/17/2016  . Allergic rhinitis due  to pollen 02/17/2016  . Headache 11/09/2015  . Chronic back pain 11/09/2015  . Diarrhea 05/19/2015  . Cysts of both ovaries 05/12/2015  . Abdominal wall hernia 02/03/2015  . Abdominal wall mass of periumbilical region 78/46/9629  . Schatzki's ring   . Diverticulosis of colon without hemorrhage   . Other hemorrhoids   . Rectal bleeding 12/11/2014  . GERD (gastroesophageal reflux disease) 12/11/2014  . Esophageal dysphagia 12/11/2014  . Constipation 12/11/2014  . S/P hysterectomy 09/01/2014  . Pelvic pain in female 07/22/2014  . Anovulation 06/18/2014  . DUB (dysfunctional uterine bleeding) 06/18/2014  . Menorrhagia with irregular cycle 06/03/2014  . Enlarged uterus 06/03/2014  . HTN (hypertension) 01/15/2014  . Human immunodeficiency virus (HIV) disease ([place]) 07/17/2013  . Schizophrenia ([place]) 07/17/2013  . Bipolar disorder, unspecified ([place]) 07/17/2013    Past Medical History:  Diagnosis Date  . Abnormal Pap smear of cervix 2005(EST)   [facility] abnl pap- [facility] [place]  . Allergy   . Asthma   . Bipolar 1 disorder ([place])   . Borderline personality disorder ([place])   . BPD (bronchopulmonary dysplasia)   . Carpal tunnel syndrome    bilateral wrist  . Depression   . Dysmenorrhea 06/03/2014  . Enlarged uterus 06/03/2014  . Fibromyalgia   . GERD (gastroesophageal reflux disease)   . Herpes simplex without mention of complication   . HIV (human immunodeficiency virus infection) (  [place])   . Hyperlipidemia   . Hypertension   . Menorrhagia with irregular cycle 06/03/2014  . Osteoarthritis   . Seizures ([place])    had as a teenager, unknown etiology and no meds.    Family History  Problem Relation Age of Onset  . Fibroids Mother   . Hypertension Mother   . Depression Mother   . Irritable bowel syndrome Mother   . Arthritis Mother   . Heart disease Mother   . Mental illness Mother   . Stroke Mother   . Vision loss Mother   . Lupus Mother   . Hypertension Father   .  Mental illness Father   . Arthritis Father   . Heart disease Father   . Vision loss Father   . Heart disease Maternal Grandmother   . Cancer Maternal Grandmother        breast  . Depression Maternal Grandmother   . Hyperlipidemia Maternal Grandmother   . Hypertension Maternal Grandmother   . Bipolar disorder Daughter   . Schizophrenia Daughter   . Lupus Daughter   . Cancer Paternal Grandfather        prostate  . Heart disease Paternal Grandfather   . Hyperlipidemia Paternal Grandfather   . Hypertension Paternal Grandfather   . Dementia Paternal Grandmother   . Hyperlipidemia Paternal Grandmother   . Hypertension Paternal Grandmother   . Heart disease Maternal Grandfather   . Alzheimer's disease Maternal Grandfather   . Hyperlipidemia Maternal Grandfather   . Hypertension Maternal Grandfather   . Anxiety disorder Daughter   . ADD / ADHD Son   . Hypertension Brother   . Early death Brother   . Learning disabilities Brother   . Mental illness Brother   . Colon cancer Neg Hx    Past Surgical History:  Procedure Laterality Date  . ABDOMINAL HYSTERECTOMY N/A 09/01/2014   Procedure: HYSTERECTOMY ABDOMINAL;  Surgeon: Jonnie Kind, MD;  Location: [facility];  Service: Gynecology;  Laterality: N/A;  . ANKLE SURGERY Right    repair of ligaments  . BACK SURGERY    . BILATERAL SALPINGECTOMY Bilateral 09/01/2014   Procedure: BILATERAL SALPINGECTOMY;  Surgeon: Jonnie Kind, MD;  Location: [facility];  Service: Gynecology;  Laterality: Bilateral;  . CESAREAN SECTION    . ckc    . COLONOSCOPY N/A 12/23/2014   IPJ:ASNK canal hemorrhoids left-sided diverticula  . ESOPHAGOGASTRODUODENOSCOPY N/A 12/23/2014   NLZ:JQBHALP reflux/noncritical shatzkis ring s/p dilation/small HH  . EXCISION OF SKIN TAG N/A 09/01/2014   Procedure: EXCISION OF SKIN NEVUS;  Surgeon: Jonnie Kind, MD;  Location: [facility];  Service: Gynecology;  Laterality: N/A;  . FRACTURE SURGERY    . MALONEY DILATION N/A 12/23/2014    Procedure: Venia Minks DILATION;  Surgeon: Daneil Dolin, MD;  [facility];  Service: Endoscopy;  Laterality: N/A;  . SAVORY DILATION N/A 12/23/2014   Procedure: SAVORY DILATION;  Surgeon: Daneil Dolin, MD;  [facility];  Service: Endoscopy;  Laterality: N/A;  . SCAR REVISION N/A 09/01/2014   Procedure: EXCISION OF CICATRIX;  Surgeon: Jonnie Kind, MD;  Location: [facility];  Service: Gynecology;  Laterality: N/A;  . SHOULDER SURGERY Right    rotator cuff  . SPINE SURGERY     Social History   Social History Narrative  . Not on file   Immunization History  Administered Date(s) Administered  . DTaP 12/04/2008  . Hepatitis A 12/04/2006  . Hepatitis B 12/04/2006  . Influenza Split  09/03/2012  . Influenza,inj,Quad PF,6+ Mos 01/07/2014, 09/02/2014, 10/24/2019, 09/23/2020  . Influenza-Unspecified 09/04/2015, 10/08/2017, 10/07/2018  . MMR 12/04/2006  . Moderna Sars-Covid-2 Vaccination 10/04/2020  . PPD Test 07/15/2013  . Pneumococcal Polysaccharide-23 09/03/2012  . Td 09/03/2012  . Tdap 06/05/2017     Objective: Vital Signs: LMP 08/11/2014    Physical Exam   Musculoskeletal Exam: ***  CDAI Exam: CDAI Score: - Patient Global: -; Provider Global: - Swollen: -; Tender: - Joint Exam 02/28/2021   No joint exam has been documented for this visit   There is currently no information documented on the homunculus. Go to the Rheumatology activity and complete the homunculus joint exam.  Investigation: No additional findings.  Imaging: No results found.  Recent Labs: Lab Results  Component Value Date   WBC 8.0 11/10/2020   HGB 13.2 11/10/2020   PLT 234 11/10/2020   NA 136 11/10/2020   K 3.8 11/10/2020   CL 103 11/10/2020   CO2 27 11/10/2020   GLUCOSE 100 (H) 11/10/2020   BUN 10 11/10/2020   CREATININE 0.64 11/10/2020   BILITOT 0.3 09/23/2020   ALKPHOS 54 05/10/2016   AST 16 09/23/2020   ALT 12 09/23/2020   PROT 6.7 09/23/2020   ALBUMIN 3.5 (L)  05/10/2016   CALCIUM 8.9 11/10/2020   GFRAA 88 09/23/2020    Speciality Comments: No specialty comments available.  Procedures:  No procedures performed Allergies: Flecainide, Naproxen, Piroxicam, Abilify [aripiprazole], Citalopram, Codeine, Depakote [divalproex sodium], Gabapentin, Haldol [haloperidol lactate], Haloperidol, Indomethacin, Invega [paliperidone], Latuda [lurasidone hcl], Lithium, Lurasidone, Promethazine, Trazodone and nefazodone, and Lexapro [escitalopram oxalate]   Assessment / Plan:     Visit Diagnoses: No diagnosis found.  ***  Orders: No orders of the defined types were placed in this encounter.  No orders of the defined types were placed in this encounter.    Follow-Up Instructions: No follow-ups on file.   Collier Salina, MD  Note - This record has been created using [employer].  Chart creation errors have been sought, but may not always  have been located. Such creation errors do not reflect on  the standard of medical care.

## 2021-02-28 ENCOUNTER — Ambulatory Visit: Payer: Medicaid Other | Admitting: Internal Medicine

## 2021-03-01 ENCOUNTER — Other Ambulatory Visit (HOSPITAL_COMMUNITY): Payer: Self-pay

## 2021-03-03 ENCOUNTER — Ambulatory Visit: Payer: Medicaid Other | Admitting: Family Medicine

## 2021-03-08 ENCOUNTER — Ambulatory Visit (INDEPENDENT_AMBULATORY_CARE_PROVIDER_SITE_OTHER): Payer: Medicaid Other | Admitting: Internal Medicine

## 2021-03-08 ENCOUNTER — Encounter: Payer: Self-pay | Admitting: Internal Medicine

## 2021-03-08 ENCOUNTER — Other Ambulatory Visit: Payer: Self-pay

## 2021-03-08 VITALS — BP 140/93 | HR 68 | Ht 65.5 in | Wt 262.2 lb

## 2021-03-08 DIAGNOSIS — R21 Rash and other nonspecific skin eruption: Secondary | ICD-10-CM

## 2021-03-08 DIAGNOSIS — M797 Fibromyalgia: Secondary | ICD-10-CM | POA: Diagnosis not present

## 2021-03-08 DIAGNOSIS — R768 Other specified abnormal immunological findings in serum: Secondary | ICD-10-CM

## 2021-03-08 DIAGNOSIS — R1319 Other dysphagia: Secondary | ICD-10-CM

## 2021-03-08 NOTE — Patient Instructions (Addendum)
The lab results are negative for lupus or related diseases. The one positive antibody test was the anti centromere antibodies. However I do not see evidence of scleroderma on examination or from your history. No additional testing or medications are recommended at this time.  I recommend checking out the [place] patient-centered guide for fibromyalgia and chronic pain management: https://www.olsen-oconnell.com/   Myofascial Pain Syndrome and Fibromyalgia Myofascial pain syndrome and fibromyalgia are both pain disorders. This pain may be felt mainly in your muscles.  Myofascial pain syndrome: ? Always has tender points in the muscle that will cause pain when pressed (trigger points). The pain may come and go. ? Usually affects your neck, upper back, and shoulder areas. The pain often radiates into your arms and hands.  Fibromyalgia: ? Has muscle pains and tenderness that come and go. ? Is often associated with fatigue and sleep problems. ? Has trigger points. ? Tends to be long-lasting (chronic), but is not life-threatening. Fibromyalgia and myofascial pain syndrome are not the same. However, they often occur together. If you have both conditions, each can make the other worse. Both are common and can cause enough pain and fatigue to make day-to-day activities difficult. Both can be hard to diagnose because their symptoms are common in many other conditions. What are the causes? The exact causes of these conditions are not known. What increases the risk? You are more likely to develop this condition if:  You have a family history of the condition.  You have certain triggers, such as: ? Spine disorders. ? An injury (trauma) or other physical stressors. ? Being under a lot of stress. ? Medical conditions such as osteoarthritis, rheumatoid arthritis, or lupus. What are the signs or symptoms? Fibromyalgia The main symptom of fibromyalgia is widespread pain and tenderness in your  muscles. Pain is sometimes described as stabbing, shooting, or burning. You may also have:  Tingling or numbness.  Sleep problems and fatigue.  Problems with attention and concentration (fibro fog). Other symptoms may include:  Bowel and bladder problems.  Headaches.  Visual problems.  Problems with odors and noises.  Depression or mood changes.  Painful menstrual periods (dysmenorrhea).  Dry skin or eyes. These symptoms can vary over time. Myofascial pain syndrome Symptoms of myofascial pain syndrome include:  Tight, ropy bands of muscle.  Uncomfortable sensations in muscle areas. These may include aching, cramping, burning, numbness, tingling, and weakness.  Difficulty moving certain parts of the body freely (poor range of motion). How is this diagnosed? This condition may be diagnosed by your symptoms and medical history. You will also have a physical exam. In general:  Fibromyalgia is diagnosed if you have pain, fatigue, and other symptoms for more than 3 months, and symptoms cannot be explained by another condition.  Myofascial pain syndrome is diagnosed if you have trigger points in your muscles, and those trigger points are tender and cause pain elsewhere in your body (referred pain). How is this treated? Treatment for these conditions depends on the type that you have.  For fibromyalgia: ? Pain medicines, such as NSAIDs. ? Medicines for treating depression. ? Medicines for treating seizures. ? Medicines that relax the muscles.  For myofascial pain: ? Pain medicines, such as NSAIDs. ? Cooling and stretching of muscles. ? Trigger point injections. ? Sound wave (ultrasound) treatments to stimulate muscles. Treating these conditions often requires a team of health care providers. These may include:  Your primary care provider.  Physical therapist.  Complementary health care  providers, such as massage therapists or acupuncturists.  Psychiatrist for  cognitive behavioral therapy.   Follow these instructions at home: Medicines  Take over-the-counter and prescription medicines only as told by your health care provider.  Do not drive or use heavy machinery while taking prescription pain medicine.  If you are taking prescription pain medicine, take actions to prevent or treat constipation. Your health care provider may recommend that you: ? Drink enough fluid to keep your urine pale yellow. ? Eat foods that are high in fiber, such as fresh fruits and vegetables, whole grains, and beans. ? Limit foods that are high in fat and processed sugars, such as fried or sweet foods. ? Take an over-the-counter or prescription medicine for constipation. Lifestyle  Exercise as directed by your health care provider or physical therapist.  Practice relaxation techniques to control your stress. You may want to try: ? Biofeedback. ? Visual imagery. ? Hypnosis. ? Muscle relaxation. ? Yoga. ? Meditation.  Maintain a healthy lifestyle. This includes eating a healthy diet and getting enough sleep.  Do not use any products that contain nicotine or tobacco, such as cigarettes and e-cigarettes. If you need help quitting, ask your health care provider.   General instructions  Talk to your health care provider about complementary treatments, such as acupuncture or massage.  Consider joining a support group with others who are diagnosed with this condition.  Do not do activities that stress or strain your muscles. This includes repetitive motions and heavy lifting.  Keep all follow-up visits as told by your health care provider. This is important. Where to find more information  National Fibromyalgia Association: www.fmaware.[place]: www.arthritis.org  American Chronic Pain Association: www.theacpa.org Contact a health care provider if:  You have new symptoms.  Your symptoms get worse or your pain is severe.  You have side  effects from your medicines.  You have trouble sleeping.  Your condition is causing depression or anxiety. Summary  Myofascial pain syndrome and fibromyalgia are pain disorders.  Myofascial pain syndrome has tender points in the muscle that will cause pain when pressed (trigger points). Fibromyalgia also has muscle pains and tenderness that come and go, but this condition is often associated with fatigue and sleep disturbances.  Fibromyalgia and myofascial pain syndrome are not the same but often occur together, causing pain and fatigue that make day-to-day activities difficult.  Treatment for fibromyalgia includes taking medicines to relax the muscles and medicines for pain, depression, or seizures. Treatment for myofascial pain syndrome includes taking medicines for pain, cooling and stretching of muscles, and injecting medicines into trigger points.  Follow your health care provider's instructions for taking medicines and maintaining a healthy lifestyle.

## 2021-03-08 NOTE — Progress Notes (Signed)
Office Visit Note  Patient: Rachel Underwood             Date of Birth: [birth year]           MRN: [id number]             PCP: Janora Norlander, DO Referring: Janora Norlander, DO Visit Date: 03/08/2021   Subjective:  Follow-up (Patient denies changes in symptoms since last visit, here to go over [place] lab results. )   History of Present Illness: Edona Nellums is a 50 y.o. female here for follow up after new patient visit with [facility] labs to evaluation positive ANA. She has about the same symptoms as before still a lot of body aches and joint pain in multiple areas, erythematous and itchy rash in places. Lab panel redemonstrates the positive ANA also positive anti centromere antibody titer. She has some lower extremity swelling intermittently. She denies typical raynaud's associated with cold exposure or digital ulcers or pitting.   Imaging reviewed: 05/2014 Myocardial imaging with Spect Lexiscan low risk report  11/2020 CXR IMPRESSION: Mild atelectasis or scarring in the right middle lobe. No active disease otherwise.  Review of Systems  Constitutional: Positive for fatigue.  HENT: Positive for mouth dryness and nose dryness. Negative for mouth sores.   Eyes: Positive for pain, itching, visual disturbance and dryness.  Respiratory: Positive for cough, shortness of breath and difficulty breathing. Negative for hemoptysis.   Cardiovascular: Positive for palpitations. Negative for chest pain and swelling in legs/feet.  Gastrointestinal: Positive for abdominal pain and constipation. Negative for blood in stool and diarrhea.  Endocrine: Negative for increased urination.  Genitourinary: Negative for painful urination.  Musculoskeletal: Positive for arthralgias, joint pain, joint swelling, myalgias, muscle weakness, morning stiffness, muscle tenderness and myalgias.  Skin: Positive for redness. Negative for color change and rash.  Allergic/Immunologic: Negative for susceptible to  infections.  Neurological: Positive for dizziness, numbness, headaches, memory loss and weakness.  Hematological: Negative for swollen glands.  Psychiatric/Behavioral: Positive for confusion and sleep disturbance.    PMFS History:  Patient Active Problem List   Diagnosis Date Noted  . Plantar wart 02/01/2021  . Tobacco abuse disorder 02/01/2021  . Positive ANA (antinuclear antibody) 01/28/2021  . Arthritis 01/28/2021  . Rash 01/28/2021  . Fibromyalgia syndrome 01/28/2021  . Bilateral carpal tunnel syndrome 02/17/2016  . Allergic rhinitis due to pollen 02/17/2016  . Headache 11/09/2015  . Chronic back pain 11/09/2015  . Diarrhea 05/19/2015  . Cysts of both ovaries 05/12/2015  . Abdominal wall hernia 02/03/2015  . Abdominal wall mass of periumbilical region 42/70/6237  . Schatzki's ring   . Diverticulosis of colon without hemorrhage   . Other hemorrhoids   . Rectal bleeding 12/11/2014  . GERD (gastroesophageal reflux disease) 12/11/2014  . Esophageal dysphagia 12/11/2014  . Constipation 12/11/2014  . S/P hysterectomy 09/01/2014  . Pelvic pain in female 07/22/2014  . Anovulation 06/18/2014  . DUB (dysfunctional uterine bleeding) 06/18/2014  . Menorrhagia with irregular cycle 06/03/2014  . Enlarged uterus 06/03/2014  . HTN (hypertension) 01/15/2014  . Human immunodeficiency virus (HIV) disease ([place]) 07/17/2013  . Schizophrenia ([place]) 07/17/2013  . Bipolar disorder, unspecified ([place]) 07/17/2013    Past Medical History:  Diagnosis Date  . Abnormal Pap smear of cervix 2005(EST)   ckc for abnl pap- [facility] [place]  . Allergy   . Asthma   . Bipolar 1 disorder ([place])   . Borderline personality disorder ([place])   . BPD (bronchopulmonary  dysplasia)   . Carpal tunnel syndrome    bilateral wrist  . Depression   . Dysmenorrhea 06/03/2014  . Enlarged uterus 06/03/2014  . Fibromyalgia   . GERD (gastroesophageal reflux disease)   . Herpes simplex without mention of  complication   . HIV (human immunodeficiency virus infection) ([place])   . Hyperlipidemia   . Hypertension   . Menorrhagia with irregular cycle 06/03/2014  . Osteoarthritis   . Seizures ([place])    had as a teenager, unknown etiology and no meds.    Family History  Problem Relation Age of Onset  . Fibroids Mother   . Hypertension Mother   . Depression Mother   . Irritable bowel syndrome Mother   . Arthritis Mother   . Heart disease Mother   . Mental illness Mother   . Stroke Mother   . Vision loss Mother   . Lupus Mother   . Hypertension Father   . Mental illness Father   . Arthritis Father   . Heart disease Father   . Vision loss Father   . Heart disease Maternal Grandmother   . Cancer Maternal Grandmother        breast  . Depression Maternal Grandmother   . Hyperlipidemia Maternal Grandmother   . Hypertension Maternal Grandmother   . Bipolar disorder Daughter   . Schizophrenia Daughter   . Lupus Daughter   . Cancer Paternal Grandfather        prostate  . Heart disease Paternal Grandfather   . Hyperlipidemia Paternal Grandfather   . Hypertension Paternal Grandfather   . Dementia Paternal Grandmother   . Hyperlipidemia Paternal Grandmother   . Hypertension Paternal Grandmother   . Heart disease Maternal Grandfather   . Alzheimer's disease Maternal Grandfather   . Hyperlipidemia Maternal Grandfather   . Hypertension Maternal Grandfather   . Anxiety disorder Daughter   . ADD / ADHD Son   . Hypertension Brother   . Early death Brother   . Learning disabilities Brother   . Mental illness Brother   . Colon cancer Neg Hx    Past Surgical History:  Procedure Laterality Date  . ABDOMINAL HYSTERECTOMY N/A 09/01/2014   Procedure: HYSTERECTOMY ABDOMINAL;  Surgeon: Jonnie Kind, MD;  Location: [facility];  Service: Gynecology;  Laterality: N/A;  . ANKLE SURGERY Right    repair of ligaments  . BACK SURGERY    . BILATERAL SALPINGECTOMY Bilateral 09/01/2014   Procedure: BILATERAL  SALPINGECTOMY;  Surgeon: Jonnie Kind, MD;  Location: [facility];  Service: Gynecology;  Laterality: Bilateral;  . CESAREAN SECTION    . ckc    . COLONOSCOPY N/A 12/23/2014   SMO:LMBE canal hemorrhoids left-sided diverticula  . ESOPHAGOGASTRODUODENOSCOPY N/A 12/23/2014   MLJ:QGBEEFE reflux/noncritical shatzkis ring s/p dilation/small HH  . EXCISION OF SKIN TAG N/A 09/01/2014   Procedure: EXCISION OF SKIN NEVUS;  Surgeon: Jonnie Kind, MD;  Location: [facility];  Service: Gynecology;  Laterality: N/A;  . FRACTURE SURGERY    . MALONEY DILATION N/A 12/23/2014   Procedure: Venia Minks DILATION;  Surgeon: Daneil Dolin, MD;  Location[facility];  Service: Endoscopy;  Laterality: N/A;  . SAVORY DILATION N/A 12/23/2014   Procedure: SAVORY DILATION;  Surgeon: Daneil Dolin, MD;  Location: [facility];  Service: Endoscopy;  Laterality: N/A;  . SCAR REVISION N/A 09/01/2014   Procedure: EXCISION OF CICATRIX;  Surgeon: Jonnie Kind, MD;  Location: [facility];  Service: Gynecology;  Laterality: N/A;  . SHOULDER SURGERY  Right    rotator cuff  . SPINE SURGERY     Social History   Social History Narrative  . Not on file   Immunization History  Administered Date(s) Administered  . DTaP 12/04/2008  . Hepatitis A 12/04/2006  . Hepatitis B 12/04/2006  . Influenza Split 09/03/2012  . Influenza,inj,Quad PF,6+ Mos 01/07/2014, 09/02/2014, 10/24/2019, 09/23/2020  . Influenza-Unspecified 09/04/2015, 10/08/2017, 10/07/2018  . MMR 12/04/2006  . Moderna Sars-Covid-2 Vaccination 10/04/2020  . PPD Test 07/15/2013  . Pneumococcal Polysaccharide-23 09/03/2012  . Td 09/03/2012  . Tdap 06/05/2017     Objective: Vital Signs: BP (!) 140/93 (BP Location: Left Arm, Patient Position: Sitting, Cuff Size: Large)   Pulse 68   Ht 5' 5.5" (1.664 m)   Wt 262 lb 3.2 oz (118.9 kg)   LMP 08/11/2014   BMI 42.97 kg/m    Physical Exam Constitutional:      Appearance: She is obese.  HENT:     Right Ear: External ear  normal.     Left Ear: External ear normal.  Skin:    General: Skin is warm and dry.     Comments: Diffusely dry skin and mild erythema on forearms No sclerodactyly Nailfold capillaroscopy appears normal  Neurological:     General: No focal deficit present.     Mental Status: She is alert.     Musculoskeletal Exam:  Elbows full ROM no tenderness or swelling Wrists full ROM no tenderness or swelling Fingers full ROM no tenderness or swelling  Investigation: No additional findings.  Imaging: No results found.  Recent Labs: Lab Results  Component Value Date   WBC 8.0 11/10/2020   HGB 13.2 11/10/2020   PLT 234 11/10/2020   NA 136 11/10/2020   K 3.8 11/10/2020   CL 103 11/10/2020   CO2 27 11/10/2020   GLUCOSE 100 (H) 11/10/2020   BUN 10 11/10/2020   CREATININE 0.64 11/10/2020   BILITOT 0.3 09/23/2020   ALKPHOS 54 05/10/2016   AST 16 09/23/2020   ALT 12 09/23/2020   PROT 6.7 09/23/2020   ALBUMIN 3.5 (L) 05/10/2016   CALCIUM 8.9 11/10/2020   GFRAA 88 09/23/2020    Speciality Comments: No specialty comments available.  Procedures:  No procedures performed Allergies: Flecainide, Naproxen, Piroxicam, Abilify [aripiprazole], Citalopram, Codeine, Depakote [divalproex sodium], Gabapentin, Haldol [haloperidol lactate], Haloperidol, Indomethacin, Invega [paliperidone], Latuda [lurasidone hcl], Lithium, Lurasidone, Promethazine, Trazodone and nefazodone, and Lexapro [escitalopram oxalate]   Assessment / Plan:     Visit Diagnoses: Positive ANA (antinuclear antibody)  Discussed positive ANA Ab results and positive anti centromere Abs. This carries most commonly associated with limited systemic sclerosis but clinically lacks sclerodactyly, raynaud's cutaneous calcifications. She has a history of GERD but this is less specific. Reports leg swelling intermittently but no major edema today and previous cardiac and chest imaging has not suggested for ILD or significant pulmonary HTN.  If symptoms develop could consider screening for this as can be preclinical disease, but no additional testing or treatment recommended at this time.  Fibromyalgia syndrome  Discussed chronic pain and fatigue related to FMS and may be increased at the moment due to life stressors. Currently on some antiepileptic medications and has been on SSRI medicines previously. Discussed importance for nonpharmacologic management with fatigue, stress, diet, exercise and recommended review [facility] fibroguide site information.  Rash  Rash looks more typical for eczema and severe dryness does not look like sclerodactyly or calcinosis cutis. Has few but not many telangiectasias, seems less likely related  to autoimmune process.  Orders: No orders of the defined types were placed in this encounter.  No orders of the defined types were placed in this encounter.   Follow-Up Instructions: Return if symptoms worsen or fail to improve.   Collier Salina, MD  Note - This record has been created using [employer].  Chart creation errors have been sought, but may not always  have been located. Such creation errors do not reflect on  the standard of medical care.

## 2021-03-10 ENCOUNTER — Other Ambulatory Visit (HOSPITAL_COMMUNITY): Payer: Self-pay

## 2021-03-14 ENCOUNTER — Other Ambulatory Visit (HOSPITAL_COMMUNITY): Payer: Self-pay

## 2021-03-14 MED ORDER — STUDY - REPRIEVE A5332 - PITAVASTATIN 4MG OR PLACEBO TABLET (PI-VAN DAM)
1.0000 | ORAL_TABLET | Freq: Every day | ORAL | 0 refills | Status: DC
  Filled 2021-03-14: qty 90, 90d supply, fill #0

## 2021-03-15 ENCOUNTER — Encounter: Payer: Medicaid Other | Admitting: *Deleted

## 2021-03-16 ENCOUNTER — Other Ambulatory Visit: Payer: Self-pay

## 2021-03-16 ENCOUNTER — Encounter (INDEPENDENT_AMBULATORY_CARE_PROVIDER_SITE_OTHER): Payer: Self-pay | Admitting: *Deleted

## 2021-03-16 VITALS — BP 141/93 | HR 92 | Temp 98.5°F | Wt 254.2 lb

## 2021-03-16 DIAGNOSIS — Z006 Encounter for examination for normal comparison and control in clinical research program: Secondary | ICD-10-CM

## 2021-03-16 NOTE — Research (Signed)
Rachel Underwood was here for her month 52 visit for Reprieve, A Randomized Trial to Prevent Vascular Events in HIV (study drug is Pitavastatin 4mg  or placebo) She says she was ill with covid in December and her whole family got sick. Her worst symptom was diarrhea and everything resolved within 2 weeks. She did not see a provider during that time or have prescribed meds. Her adherence is good. She did forget to take her study med yesterday though. She will be returning in 4 months for the next study visit.

## 2021-03-21 ENCOUNTER — Other Ambulatory Visit (HOSPITAL_COMMUNITY): Payer: Self-pay

## 2021-03-21 MED FILL — Bictegravir-Emtricitabine-Tenofovir AF Tab 50-200-25 MG: ORAL | 90 days supply | Qty: 90 | Fill #0 | Status: CN

## 2021-03-23 ENCOUNTER — Other Ambulatory Visit (HOSPITAL_COMMUNITY): Payer: Self-pay

## 2021-03-24 ENCOUNTER — Encounter: Payer: Self-pay | Admitting: Family Medicine

## 2021-04-12 ENCOUNTER — Ambulatory Visit: Payer: Medicaid Other | Admitting: Family Medicine

## 2021-04-15 ENCOUNTER — Encounter: Payer: Self-pay | Admitting: Family Medicine

## 2021-04-18 ENCOUNTER — Other Ambulatory Visit (HOSPITAL_COMMUNITY): Payer: Self-pay

## 2021-04-22 ENCOUNTER — Other Ambulatory Visit (HOSPITAL_COMMUNITY): Payer: Self-pay

## 2021-04-25 ENCOUNTER — Other Ambulatory Visit (HOSPITAL_COMMUNITY): Payer: Self-pay

## 2021-04-29 ENCOUNTER — Other Ambulatory Visit (HOSPITAL_COMMUNITY): Payer: Self-pay

## 2021-05-11 ENCOUNTER — Other Ambulatory Visit: Payer: Self-pay | Admitting: Family Medicine

## 2021-05-11 ENCOUNTER — Other Ambulatory Visit (HOSPITAL_COMMUNITY): Payer: Self-pay

## 2021-05-11 ENCOUNTER — Other Ambulatory Visit (HOSPITAL_COMMUNITY): Payer: Self-pay | Admitting: Emergency Medicine

## 2021-05-11 ENCOUNTER — Other Ambulatory Visit: Payer: Self-pay | Admitting: Physician Assistant

## 2021-05-11 DIAGNOSIS — L304 Erythema intertrigo: Secondary | ICD-10-CM

## 2021-05-11 MED FILL — Dexlansoprazole Cap Delayed Release 60 MG: ORAL | 90 days supply | Qty: 90 | Fill #0 | Status: CN

## 2021-05-11 MED FILL — Ondansetron Orally Disintegrating Tab 8 MG: ORAL | 10 days supply | Qty: 30 | Fill #0 | Status: AC

## 2021-05-12 ENCOUNTER — Other Ambulatory Visit (HOSPITAL_COMMUNITY): Payer: Self-pay

## 2021-05-13 ENCOUNTER — Other Ambulatory Visit (HOSPITAL_COMMUNITY): Payer: Self-pay

## 2021-05-16 ENCOUNTER — Other Ambulatory Visit (HOSPITAL_COMMUNITY): Payer: Self-pay

## 2021-05-18 ENCOUNTER — Other Ambulatory Visit (HOSPITAL_COMMUNITY): Payer: Self-pay

## 2021-05-19 ENCOUNTER — Other Ambulatory Visit (HOSPITAL_COMMUNITY): Payer: Self-pay

## 2021-05-19 MED FILL — Bictegravir-Emtricitabine-Tenofovir AF Tab 50-200-25 MG: ORAL | 90 days supply | Qty: 90 | Fill #0 | Status: CN

## 2021-05-20 ENCOUNTER — Other Ambulatory Visit (HOSPITAL_COMMUNITY): Payer: Self-pay

## 2021-05-20 MED FILL — Bictegravir-Emtricitabine-Tenofovir AF Tab 50-200-25 MG: ORAL | 90 days supply | Qty: 90 | Fill #0 | Status: AC

## 2021-05-23 ENCOUNTER — Other Ambulatory Visit (HOSPITAL_COMMUNITY): Payer: Self-pay

## 2021-05-24 ENCOUNTER — Other Ambulatory Visit (HOSPITAL_COMMUNITY): Payer: Self-pay

## 2021-05-26 ENCOUNTER — Other Ambulatory Visit (HOSPITAL_COMMUNITY): Payer: Self-pay

## 2021-05-27 ENCOUNTER — Other Ambulatory Visit (HOSPITAL_COMMUNITY): Payer: Self-pay

## 2021-05-30 ENCOUNTER — Other Ambulatory Visit (HOSPITAL_COMMUNITY): Payer: Self-pay

## 2021-06-10 ENCOUNTER — Other Ambulatory Visit (HOSPITAL_COMMUNITY): Payer: Self-pay

## 2021-06-13 ENCOUNTER — Other Ambulatory Visit (HOSPITAL_COMMUNITY): Payer: Self-pay

## 2021-06-13 MED FILL — Bictegravir-Emtricitabine-Tenofovir AF Tab 50-200-25 MG: ORAL | 90 days supply | Qty: 90 | Fill #1 | Status: CN

## 2021-06-14 ENCOUNTER — Other Ambulatory Visit (HOSPITAL_COMMUNITY): Payer: Self-pay

## 2021-06-14 MED FILL — Bictegravir-Emtricitabine-Tenofovir AF Tab 50-200-25 MG: ORAL | 30 days supply | Qty: 30 | Fill #1 | Status: CN

## 2021-06-14 MED FILL — Albuterol Sulfate Inhal Aero 108 MCG/ACT (90MCG Base Equiv): RESPIRATORY_TRACT | 16 days supply | Qty: 8.5 | Fill #0 | Status: AC

## 2021-06-14 MED FILL — Dexlansoprazole Cap Delayed Release 60 MG: ORAL | 30 days supply | Qty: 30 | Fill #0 | Status: CN

## 2021-06-14 MED FILL — Sertraline HCl Tab 100 MG: ORAL | 30 days supply | Qty: 60 | Fill #0 | Status: AC

## 2021-06-14 MED FILL — Acyclovir Tab 400 MG: ORAL | 90 days supply | Qty: 180 | Fill #0 | Status: AC

## 2021-06-14 MED FILL — Dexlansoprazole Cap Delayed Release 60 MG: ORAL | 90 days supply | Qty: 90 | Fill #0 | Status: CN

## 2021-06-15 ENCOUNTER — Other Ambulatory Visit (HOSPITAL_COMMUNITY): Payer: Self-pay

## 2021-06-15 MED FILL — Bictegravir-Emtricitabine-Tenofovir AF Tab 50-200-25 MG: ORAL | 30 days supply | Qty: 30 | Fill #1 | Status: AC

## 2021-06-16 ENCOUNTER — Other Ambulatory Visit (HOSPITAL_COMMUNITY): Payer: Self-pay

## 2021-06-17 ENCOUNTER — Other Ambulatory Visit (HOSPITAL_COMMUNITY): Payer: Self-pay

## 2021-07-11 ENCOUNTER — Other Ambulatory Visit (HOSPITAL_COMMUNITY): Payer: Self-pay

## 2021-07-13 ENCOUNTER — Other Ambulatory Visit (HOSPITAL_COMMUNITY): Payer: Self-pay

## 2021-07-14 ENCOUNTER — Encounter: Payer: Medicaid Other | Admitting: *Deleted

## 2021-07-15 ENCOUNTER — Other Ambulatory Visit (HOSPITAL_COMMUNITY): Payer: Self-pay

## 2021-07-18 ENCOUNTER — Other Ambulatory Visit (HOSPITAL_COMMUNITY): Payer: Self-pay

## 2021-07-18 MED FILL — Cromolyn Sodium Ophth Soln 4%: OPHTHALMIC | 37 days supply | Qty: 10 | Fill #0 | Status: AC

## 2021-07-18 MED FILL — Albuterol Sulfate Inhal Aero 108 MCG/ACT (90MCG Base Equiv): RESPIRATORY_TRACT | 16 days supply | Qty: 8.5 | Fill #1 | Status: AC

## 2021-07-19 ENCOUNTER — Other Ambulatory Visit (HOSPITAL_COMMUNITY): Payer: Self-pay

## 2021-07-26 ENCOUNTER — Other Ambulatory Visit (HOSPITAL_COMMUNITY): Payer: Self-pay

## 2021-07-29 ENCOUNTER — Encounter: Payer: Medicaid Other | Admitting: *Deleted

## 2021-08-01 ENCOUNTER — Other Ambulatory Visit: Payer: Self-pay

## 2021-08-01 ENCOUNTER — Encounter (INDEPENDENT_AMBULATORY_CARE_PROVIDER_SITE_OTHER): Payer: Self-pay | Admitting: *Deleted

## 2021-08-01 DIAGNOSIS — Z006 Encounter for examination for normal comparison and control in clinical research program: Secondary | ICD-10-CM
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
## 2021-08-03 ENCOUNTER — Encounter: Payer: Medicaid Other | Admitting: *Deleted

## 2021-10-13 ENCOUNTER — Encounter: Payer: Medicaid Other | Admitting: Family Medicine

## 2021-11-09 ENCOUNTER — Other Ambulatory Visit: Payer: Self-pay

## 2021-11-09 ENCOUNTER — Encounter (INDEPENDENT_AMBULATORY_CARE_PROVIDER_SITE_OTHER): Payer: Medicaid Other | Admitting: *Deleted

## 2021-11-09 DIAGNOSIS — Z006 Encounter for examination for normal comparison and control in clinical research program: Secondary | ICD-10-CM

## 2021-11-09 NOTE — Research (Signed)
Rachel Underwood was here for her month 61 visit for Reprieve. She took the train down from [place] to get here but forgot to bring her meds and has been here a fe days and will return soon. She says her adherence has been excellent up until this happenned. She has restarted her psych meds and going for counseling regularly since her Mother died in Sep 09, 2023. Her Bp is better but not at target, 134/97. She is on losartan now for that. She will be returning in [place] for the next visit.

## 2022-01-24 ENCOUNTER — Ambulatory Visit (INDEPENDENT_AMBULATORY_CARE_PROVIDER_SITE_OTHER): Payer: Medicaid Other | Admitting: Internal Medicine

## 2022-01-24 ENCOUNTER — Other Ambulatory Visit: Payer: Self-pay

## 2022-01-24 ENCOUNTER — Encounter: Payer: Self-pay | Admitting: Internal Medicine

## 2022-01-24 ENCOUNTER — Other Ambulatory Visit (HOSPITAL_COMMUNITY): Payer: Self-pay

## 2022-01-24 VITALS — BP 148/96 | HR 70 | Temp 97.4°F | Ht 66.0 in | Wt 256.0 lb

## 2022-01-24 DIAGNOSIS — Z79899 Other long term (current) drug therapy: Secondary | ICD-10-CM | POA: Diagnosis not present

## 2022-01-24 DIAGNOSIS — K2100 Gastro-esophageal reflux disease with esophagitis, without bleeding: Secondary | ICD-10-CM | POA: Diagnosis not present

## 2022-01-24 DIAGNOSIS — N63 Unspecified lump in unspecified breast: Secondary | ICD-10-CM

## 2022-01-24 DIAGNOSIS — L304 Erythema intertrigo: Secondary | ICD-10-CM | POA: Diagnosis not present

## 2022-01-24 DIAGNOSIS — K219 Gastro-esophageal reflux disease without esophagitis: Secondary | ICD-10-CM | POA: Diagnosis not present

## 2022-01-24 DIAGNOSIS — B20 Human immunodeficiency virus [HIV] disease: Secondary | ICD-10-CM

## 2022-01-24 DIAGNOSIS — N6320 Unspecified lump in the left breast, unspecified quadrant: Secondary | ICD-10-CM

## 2022-01-24 DIAGNOSIS — Z8540 Personal history of malignant neoplasm of unspecified female genital organ: Secondary | ICD-10-CM

## 2022-01-24 MED ORDER — IBUPROFEN 800 MG PO TABS
800.0000 mg | ORAL_TABLET | Freq: Three times a day (TID) | ORAL | 0 refills | Status: DC | PRN
  Filled 2022-01-24: qty 30, 10d supply, fill #0

## 2022-01-24 MED ORDER — NYSTATIN 100000 UNIT/GM EX POWD
Freq: Three times a day (TID) | CUTANEOUS | 1 refills | Status: DC
  Filled 2022-01-24: qty 30, 14d supply, fill #0
  Filled 2022-05-09: qty 30, 14d supply, fill #1

## 2022-01-24 MED ORDER — ACYCLOVIR 400 MG PO TABS
ORAL_TABLET | ORAL | 3 refills | Status: DC
  Filled 2022-01-24: qty 180, 90d supply, fill #0
  Filled 2022-05-09: qty 180, 90d supply, fill #1
  Filled 2022-07-18 – 2022-08-16 (×2): qty 180, 90d supply, fill #2
  Filled 2022-10-23 – 2022-11-15 (×2): qty 180, 90d supply, fill #3

## 2022-01-24 MED ORDER — DEXLANSOPRAZOLE 60 MG PO CPDR
1.0000 | DELAYED_RELEASE_CAPSULE | Freq: Every day | ORAL | 3 refills | Status: DC
  Filled 2022-01-24 – 2022-02-20 (×3): qty 90, 90d supply, fill #0
  Filled 2022-05-09: qty 90, 90d supply, fill #1

## 2022-01-24 MED ORDER — LINACLOTIDE 290 MCG PO CAPS
290.0000 ug | ORAL_CAPSULE | Freq: Every day | ORAL | 2 refills | Status: DC
  Filled 2022-01-24: qty 30, 30d supply, fill #0
  Filled 2022-05-09: qty 30, 30d supply, fill #1

## 2022-01-24 NOTE — Progress Notes (Signed)
RFV: follow up for hiv disease  Patient ID: Rachel Underwood, female   DOB: 04-03-1971, 51 y.o.   MRN: [id number]  HPI Rachel Underwood is a 51yo F with hiv disease, anxiety on biktarvy [place] in [place], [place]. Transitioning care back to [place]  She moved into her dad's house to care for him since her mom past away in sept 2022. Also has a care taker. In [place]  Breast lump+ Outpatient Encounter Medications as of 01/24/2022  Medication Sig   albuterol (VENTOLIN HFA) 108 (90 Base) MCG/ACT inhaler INHALE 2 PUFFS INTO THE LUNGS EVERY 4 HOURS AS NEEDED FOR WHEEZING OR SHORTNESS OF BREATH.   bictegravir-emtricitabine-tenofovir AF (BIKTARVY) 50-200-25 MG TABS tablet TAKE 1 TABLET BY MOUTH DAILY.   cromolyn (OPTICROM) 4 % ophthalmic solution 1 drop 2 (two) times daily.   cycloSPORINE (RESTASIS) 0.05 % ophthalmic emulsion    hydrocortisone 2.5 % lotion Apply topically.   lamoTRIgine (LAMICTAL) 200 MG tablet TAKE 1 TABLET BY MOUTH ONCE A DAY   losartan (COZAAR) 25 MG tablet Take 1 tablet by mouth daily.   ondansetron (ZOFRAN-ODT) 4 MG disintegrating tablet Take 4 mg by mouth 3 (three) times daily as needed.   propranolol (INDERAL) 10 MG tablet Take 10 mg by mouth 2 (two) times daily.   QUEtiapine (SEROQUEL) 200 MG tablet Take 400 mg by mouth at bedtime.   sertraline (ZOLOFT) 100 MG tablet TAKE 2 TABLETS BY MOUTH ONCE DAILY   Study - REPRIEVE [phone] - pitavastatin 4 mg or placebo tablet ([place]) Take 1 tablet by mouth daily.  [id number] I [id number] E ACTG [id number]   acyclovir (ZOVIRAX) 400 MG tablet TAKE 1 TABLET BY MOUTH 2 TIMES DAILY AS NEEDED FOR PROPHYLAXIS (Patient not taking: Reported on 03/16/2021)   atenolol (TENORMIN) 25 MG tablet Take 1 tablet (25 mg total) by mouth daily. (Patient not taking: Reported on 01/24/2022)   azelastine (ASTELIN) 0.1 % nasal spray Place 1 spray into both nostrils 2 (two) times daily. (Patient not taking: Reported on 03/08/2021)   dexlansoprazole (DEXILANT) 60 MG capsule TAKE 1  CAPSULE BY MOUTH ONCE A DAY (Patient not taking: Reported on 01/24/2022)   ibuprofen (ADVIL) 800 MG tablet Take by mouth. (Patient not taking: Reported on 01/24/2022)   levocetirizine (XYZAL) 5 MG tablet TAKE 1 TABLET BY MOUTH EVERY EVENING (Patient not taking: Reported on 03/16/2021)   linaclotide (LINZESS) 290 MCG CAPS capsule Take 1 capsule (290 mcg total) by mouth daily. On empty stomach (Patient not taking: Reported on 01/24/2022)   montelukast (SINGULAIR) 10 MG tablet Take 1 tablet (10 mg total) by mouth at bedtime. (Patient not taking: Reported on 03/16/2021)   nystatin (MYCOSTATIN/NYSTOP) powder Apply topically 3 (three) times daily. For up to 2 weeks for rash under breast (Patient not taking: Reported on 01/24/2022)   No facility-administered encounter medications on file as of 01/24/2022.     Patient Active Problem List   Diagnosis Date Noted   Plantar wart 02/01/2021   Tobacco abuse disorder 02/01/2021   Positive ANA (antinuclear antibody) 01/28/2021   Arthritis 01/28/2021   Rash 01/28/2021   Fibromyalgia syndrome 01/28/2021   Bilateral carpal tunnel syndrome 02/17/2016   Allergic rhinitis due to pollen 02/17/2016   Headache 11/09/2015   Chronic back pain 11/09/2015   Diarrhea 05/19/2015   Cysts of both ovaries 05/12/2015   Abdominal wall hernia 02/03/2015   Abdominal wall mass of periumbilical region 71/05/2693   Schatzki's ring    Diverticulosis of colon without hemorrhage    Other hemorrhoids  Rectal bleeding 12/11/2014   GERD (gastroesophageal reflux disease) 12/11/2014   Esophageal dysphagia 12/11/2014   Constipation 12/11/2014   S/P hysterectomy 09/01/2014   Pelvic pain in female 07/22/2014   Anovulation 06/18/2014   DUB (dysfunctional uterine bleeding) 06/18/2014   Menorrhagia with irregular cycle 06/03/2014   Enlarged uterus 06/03/2014   HTN (hypertension) 01/15/2014   Human immunodeficiency virus (HIV) disease ([place]) 07/17/2013   Schizophrenia ([place]) 07/17/2013    Bipolar disorder, unspecified ([place]) 07/17/2013     Health Maintenance Due  Topic Date Due   MAMMOGRAM  01/27/2017   COVID-19 Vaccine (2 - Moderna risk series) 11/01/2020   INFLUENZA VACCINE  07/04/2021   PAP SMEAR-Modifier  10/12/2021     Review of Systems Review of Systems  Constitutional: Negative for fever, chills, diaphoresis, activity change, appetite change, fatigue and unexpected weight change.  HENT: Negative for congestion, sore throat, rhinorrhea, sneezing, trouble swallowing and sinus pressure.  Eyes: Negative for photophobia and visual disturbance.  Respiratory: Negative for cough, chest tightness, shortness of breath, wheezing and stridor.  Cardiovascular: Negative for chest pain, palpitations and leg swelling.  Gastrointestinal: Negative for nausea, vomiting, abdominal pain, diarrhea, constipation, blood in stool, abdominal distention and anal bleeding.  Genitourinary: Negative for dysuria, hematuria, flank pain and difficulty urinating.  Musculoskeletal: Negative for myalgias, back pain, joint swelling, arthralgias and gait problem.  Skin: Negative for color change, pallor, rash and wound.  Neurological: Negative for dizziness, tremors, weakness and light-headedness.  Hematological: Negative for adenopathy. Does not bruise/bleed easily.  Psychiatric/Behavioral: Negative for behavioral problems, confusion, sleep disturbance, dysphoric mood, decreased concentration and agitation.   Physical Exam   BP (!) 148/96   Pulse 70   Temp (!) 97.4 F (36.3 C) (Oral)   Ht 5\' 6"  (1.676 m)   Wt 256 lb (116.1 kg)   LMP 08/11/2014   SpO2 98%   BMI 41.32 kg/m   Physical Exam  Constitutional:  oriented to person, place, and time. appears well-developed and well-nourished. No distress.  HENT: [place]/AT, PERRLA, no scleral icterus Mouth/Throat: Oropharynx is clear and moist. No oropharyngeal exudate.  Cardiovascular: Normal rate, regular rhythm and normal heart sounds. Exam  reveals no gallop and no friction rub.  No murmur heard.  Breast = breast lump +left side Pulmonary/Chest: Effort normal and breath sounds normal. No respiratory distress.  has no wheezes.  Neck = supple, no nuchal rigidity Abdominal: Soft. Bowel sounds are normal.  exhibits no distension. There is no tenderness.  Lymphadenopathy: no cervical adenopathy. No axillary adenopathy Neurological: alert and oriented to person, place, and time.  Skin: Skin is warm and dry. No rash noted. No erythema.  Psychiatric: a normal mood and affect.  behavior is normal.   Lab Results  Component Value Date   CD4TCELL 35 09/23/2020   Lab Results  Component Value Date   CD4TABS 801 09/23/2020   CD4TABS 636 03/17/2020   CD4TABS 331 (L) 05/26/2019   Lab Results  Component Value Date   HIV1RNAQUANT <20 09/23/2020   Lab Results  Component Value Date   HEPBSAB POS (A) 07/15/2013   Lab Results  Component Value Date   LABRPR NON-REACTIVE 09/23/2020    CBC Lab Results  Component Value Date   WBC 8.0 11/10/2020   RBC 4.11 11/10/2020   HGB 13.2 11/10/2020   HCT 40.3 11/10/2020   PLT 234 11/10/2020   MCV 98.1 11/10/2020   MCH 32.1 11/10/2020   MCHC 32.8 11/10/2020   RDW 13.1 11/10/2020   LYMPHSABS 2,192  09/23/2020   MONOABS 528 05/10/2016   EOSABS 56 09/23/2020    BMET Lab Results  Component Value Date   NA 136 11/10/2020   K 3.8 11/10/2020   CL 103 11/10/2020   CO2 27 11/10/2020   GLUCOSE 100 (H) 11/10/2020   BUN 10 11/10/2020   CREATININE 0.64 11/10/2020   CALCIUM 8.9 11/10/2020   GFRNONAA >60 11/10/2020   GFRAA 88 09/23/2020      Assessment and Plan Hiv disease= will get labs to see that she is undetectable. 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 New lump she has noticed on left+. Will get breast mammo for evaluation  Gerd = can use omeprazole

## 2022-01-25 ENCOUNTER — Other Ambulatory Visit (HOSPITAL_COMMUNITY): Payer: Self-pay

## 2022-01-25 NOTE — Progress Notes (Signed)
Patient meet with [facility] during visit today.

## 2022-01-26 ENCOUNTER — Telehealth: Payer: Self-pay

## 2022-01-26 LAB — CBC WITH DIFFERENTIAL/PLATELET
Absolute Monocytes: 377 cells/uL (ref 200–950)
Basophils Absolute: 29 cells/uL (ref 0–200)
Basophils Relative: 0.5 %
Eosinophils Absolute: 87 cells/uL (ref 15–500)
Eosinophils Relative: 1.5 %
HCT: 40.9 % (ref 35.0–45.0)
Hemoglobin: 13.7 g/dL (ref 11.7–15.5)
Lymphs Abs: 2691 cells/uL (ref 850–3900)
MCH: 31.1 pg (ref 27.0–33.0)
MCHC: 33.5 g/dL (ref 32.0–36.0)
MCV: 92.7 fL (ref 80.0–100.0)
MPV: 9.4 fL (ref 7.5–12.5)
Monocytes Relative: 6.5 %
Neutro Abs: 2616 cells/uL (ref 1500–7800)
Neutrophils Relative %: 45.1 %
Platelets: 217 10*3/uL (ref 140–400)
RBC: 4.41 10*6/uL (ref 3.80–5.10)
RDW: 12.4 % (ref 11.0–15.0)
Total Lymphocyte: 46.4 %
WBC: 5.8 10*3/uL (ref 3.8–10.8)

## 2022-01-26 LAB — HIV-1 RNA QUANT-NO REFLEX-BLD
HIV 1 RNA Quant: NOT DETECTED Copies/mL
HIV-1 RNA Quant, Log: NOT DETECTED Log cps/mL

## 2022-01-26 LAB — COMPLETE METABOLIC PANEL WITH GFR
AG Ratio: 1.8 (calc) (ref 1.0–2.5)
ALT: 15 U/L (ref 6–29)
AST: 13 U/L (ref 10–35)
Albumin: 4.3 g/dL (ref 3.6–5.1)
Alkaline phosphatase (APISO): 58 U/L (ref 31–125)
BUN: 18 mg/dL (ref 7–25)
CO2: 30 mmol/L (ref 20–32)
Calcium: 9.6 mg/dL (ref 8.6–10.2)
Chloride: 105 mmol/L (ref 98–110)
Creat: 0.81 mg/dL (ref 0.50–0.99)
Globulin: 2.4 g/dL (calc) (ref 1.9–3.7)
Glucose, Bld: 82 mg/dL (ref 65–99)
Potassium: 4.8 mmol/L (ref 3.5–5.3)
Sodium: 139 mmol/L (ref 135–146)
Total Bilirubin: 0.5 mg/dL (ref 0.2–1.2)
Total Protein: 6.7 g/dL (ref 6.1–8.1)
eGFR: 89 mL/min/{1.73_m2} (ref >=60)

## 2022-01-26 LAB — LIPID PANEL
Cholesterol: 207 mg/dL — ABNORMAL HIGH (ref <200)
HDL: 60 mg/dL (ref >=50)
LDL Cholesterol (Calc): 128 mg/dL (calc) — ABNORMAL HIGH
Non-HDL Cholesterol (Calc): 147 mg/dL (calc) — ABNORMAL HIGH (ref <130)
Total CHOL/HDL Ratio: 3.5 (calc) (ref <5.0)
Triglycerides: 89 mg/dL (ref <150)

## 2022-01-26 LAB — T-HELPER CELLS (CD4) COUNT (NOT AT ARMC)
Absolute CD4: 803 cells/uL (ref 490–1740)
CD4 T Helper %: 32 % (ref 30–61)
Total lymphocyte count: 2490 cells/uL (ref 850–3900)

## 2022-01-26 LAB — RPR: RPR Ser Ql: NONREACTIVE

## 2022-01-26 NOTE — Telephone Encounter (Signed)
Initiated PA for dexlansoprazole (DEXILANT) 60 MG capsule. Spoke with Sharyn Lull at [facility] who was able to assist. PA will take 24 hours before determination is made.  Will need to call [facility] for determination. [phone] Leatrice Jewels, RMA

## 2022-01-30 ENCOUNTER — Other Ambulatory Visit (HOSPITAL_COMMUNITY): Payer: Self-pay

## 2022-01-31 NOTE — Telephone Encounter (Signed)
Followed up with [facility] regarding PA. PA was denied. Per insurance patient will to be prescribed one of the following approved medications. pantoprazole tablet (generic for Protonix) or esomeprazole magnesium capsule (generic for Nexium rx.   Will forward message to MD. Leatrice Jewels, RMA

## 2022-02-13 ENCOUNTER — Other Ambulatory Visit: Payer: Self-pay | Admitting: Internal Medicine

## 2022-02-13 ENCOUNTER — Other Ambulatory Visit (HOSPITAL_COMMUNITY): Payer: Self-pay

## 2022-02-13 DIAGNOSIS — B20 Human immunodeficiency virus [HIV] disease: Secondary | ICD-10-CM

## 2022-02-13 MED ORDER — QUETIAPINE FUMARATE 200 MG PO TABS
ORAL_TABLET | ORAL | 3 refills | Status: DC
  Filled 2022-02-13: qty 60, 30d supply, fill #0
  Filled 2022-05-09: qty 60, 30d supply, fill #1
  Filled 2022-06-13: qty 60, 30d supply, fill #2

## 2022-02-13 MED ORDER — BICTEGRAVIR-EMTRICITAB-TENOFOV 50-200-25 MG PO TABS
1.0000 | ORAL_TABLET | Freq: Every day | ORAL | 1 refills | Status: DC
  Filled 2022-02-13: qty 30, 30d supply, fill #0

## 2022-02-14 ENCOUNTER — Telehealth: Payer: Self-pay

## 2022-02-14 NOTE — Telephone Encounter (Signed)
Received e-mail from case manager attempted to connect patient with on site counselor. Staff have contacted patient last week and today for scheduling, but unable to reach. Will send patient a Mychart message as well. ? ?Rachel Underwood ? ?

## 2022-02-16 ENCOUNTER — Telehealth: Payer: Self-pay

## 2022-02-16 ENCOUNTER — Other Ambulatory Visit (HOSPITAL_COMMUNITY): Payer: Self-pay | Admitting: Internal Medicine

## 2022-02-16 NOTE — Telephone Encounter (Addendum)
Patient is calling requesting a refill on losartan. Patient is not scheduled with pcp until June. Rx will need to be sent to [facility]. ?Please advise ?

## 2022-02-17 ENCOUNTER — Other Ambulatory Visit: Payer: Self-pay

## 2022-02-17 ENCOUNTER — Other Ambulatory Visit (HOSPITAL_COMMUNITY): Payer: Self-pay

## 2022-02-17 ENCOUNTER — Other Ambulatory Visit: Payer: Self-pay | Admitting: Internal Medicine

## 2022-02-17 ENCOUNTER — Encounter (INDEPENDENT_AMBULATORY_CARE_PROVIDER_SITE_OTHER): Payer: Self-pay | Admitting: *Deleted

## 2022-02-17 VITALS — BP 131/90 | HR 63 | Temp 97.7°F | Resp 16 | Wt 241.5 lb

## 2022-02-17 DIAGNOSIS — Z006 Encounter for examination for normal comparison and control in clinical research program: Secondary | ICD-10-CM

## 2022-02-17 MED ORDER — LOSARTAN POTASSIUM 25 MG PO TABS
25.0000 mg | ORAL_TABLET | Freq: Every day | ORAL | 11 refills | Status: DC
  Filled 2022-02-17: qty 30, 30d supply, fill #0

## 2022-02-17 NOTE — Research (Signed)
Rachel Underwood seen for Research study Reprieve, visit month 96. Rachel Underwood states she is doing the same as she was on her last visit with research. States she is out of blood pressure meds and she reached out to the [facility] yesterday about refills.  Rachel Underwood does have a visit planned with a PCP in June. Nurse encouraged participant to reach back out to the [facility] about refills until she can be seen by her PCP. Overall, Rachel Underwood is doing well.  She is compliant with study meds and rarely misses a dose.  Rachel Underwood declines any new medical issues or concerns.  ?

## 2022-02-20 ENCOUNTER — Other Ambulatory Visit (HOSPITAL_COMMUNITY): Payer: Self-pay

## 2022-02-20 ENCOUNTER — Telehealth: Payer: Self-pay

## 2022-02-20 NOTE — Telephone Encounter (Signed)
PA for Dexilant was denied. Called [facility] to re-submit PA. PA approved from 02/20/22-02/15/23. ? ?Approval # [id number] ? ?Notified [facility] of approval. Notified patient of approval. ? ?Beryle Flock, RN ? ? ?

## 2022-02-21 ENCOUNTER — Other Ambulatory Visit: Payer: Self-pay

## 2022-02-21 ENCOUNTER — Other Ambulatory Visit (HOSPITAL_COMMUNITY): Payer: Self-pay

## 2022-02-21 ENCOUNTER — Emergency Department (HOSPITAL_COMMUNITY)
Admission: EM | Admit: 2022-02-21 | Discharge: 2022-02-21 | Disposition: A | Discharge status: Home / Self Care | Payer: Medicaid Other | Attending: Emergency Medicine | Admitting: Emergency Medicine

## 2022-02-21 ENCOUNTER — Encounter (HOSPITAL_COMMUNITY): Payer: Self-pay | Admitting: *Deleted

## 2022-02-21 DIAGNOSIS — R079 Chest pain, unspecified: Secondary | ICD-10-CM | POA: Diagnosis present

## 2022-02-21 DIAGNOSIS — I10 Essential (primary) hypertension: Secondary | ICD-10-CM | POA: Diagnosis not present

## 2022-02-21 DIAGNOSIS — Z79899 Other long term (current) drug therapy: Secondary | ICD-10-CM | POA: Insufficient documentation

## 2022-02-21 MED ORDER — LOSARTAN POTASSIUM 25 MG PO TABS
50.0000 mg | ORAL_TABLET | Freq: Once | ORAL | Status: AC
  Administered 2022-02-21: 50 mg via ORAL
  Filled 2022-02-21: qty 2

## 2022-02-21 MED ORDER — LOSARTAN POTASSIUM 50 MG PO TABS: 50.0000 mg | ORAL_TABLET | Freq: Every day | ORAL | 4 refills | Status: DC

## 2022-02-21 NOTE — Discharge Instructions (Addendum)
Follow up with primary care as scheduled ?

## 2022-02-21 NOTE — ED Triage Notes (Signed)
Ran out of blood pressure medication over a week ago, here today for headache and blood pressure treatment ?

## 2022-02-22 ENCOUNTER — Other Ambulatory Visit (HOSPITAL_COMMUNITY): Payer: Self-pay

## 2022-02-23 ENCOUNTER — Other Ambulatory Visit: Payer: Self-pay

## 2022-02-23 ENCOUNTER — Ambulatory Visit: Payer: Medicaid Other

## 2022-02-23 ENCOUNTER — Other Ambulatory Visit (HOSPITAL_COMMUNITY): Payer: Self-pay
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
## 2022-02-24 NOTE — ED Provider Notes (Signed)
?[facility] ?Provider Note ? ? ?CSN: [id number] ?Arrival date & time: 02/21/22  1728 ? ?  ? ?History ? ?Chief Complaint  ?Patient presents with  ? Hypertension  ? ? ?Rachel Underwood is a 51 y.o. female. ? ?Pt reports she is out of her blood pressure medications.  Pt request rx.  Pt reports her blood pressure has been high. ? ?The history is provided by the patient. No language interpreter was used.  ?Hypertension ?This is a recurrent problem. The problem occurs constantly. The problem has not changed since onset.Associated symptoms include chest pain. Pertinent negatives include no abdominal pain, no headaches and no shortness of breath. Nothing relieves the symptoms. She has tried nothing for the symptoms. The treatment provided no relief.  ? ?  ? ?Home Medications ?Prior to Admission medications   ?Medication Sig Start Date End Date Taking? Authorizing Provider  ?losartan (COZAAR) 50 MG tablet Take 1 tablet (50 mg total) by mouth daily. 02/21/22 02/21/23 Yes Caryl Ada K, PA-C  ?acyclovir (ZOVIRAX) 400 MG tablet TAKE 1 TABLET BY MOUTH 2 TIMES DAILY AS NEEDED FOR PROPHYLAXIS 01/24/22 01/24/23  Carlyle Basques, MD  ?albuterol (VENTOLIN HFA) 108 (90 Base) MCG/ACT inhaler INHALE 2 PUFFS INTO THE LUNGS EVERY 4 HOURS AS NEEDED FOR WHEEZING OR SHORTNESS OF BREATH. 02/14/21 02/14/22  Carlyle Basques, MD  ?atenolol (TENORMIN) 25 MG tablet Take 1 tablet (25 mg total) by mouth daily. ?Patient not taking: Reported on 01/24/2022 11/11/20   Evalee Jefferson, PA-C  ?azelastine (ASTELIN) 0.1 % nasal spray Place 1 spray into both nostrils 2 (two) times daily. ?Patient not taking: Reported on 03/08/2021 10/12/20   Janora Norlander, DO  ?bictegravir-emtricitabine-tenofovir AF (BIKTARVY) 50-200-25 MG TABS tablet TAKE 1 TABLET BY MOUTH DAILY. 02/13/22 02/13/23  Carlyle Basques, MD  ?cromolyn (OPTICROM) 4 % ophthalmic solution 1 drop 2 (two) times daily. 01/04/21   [provider]  ?cycloSPORINE (RESTASIS) 0.05 % ophthalmic  emulsion  08/15/19   [provider]  ?dexlansoprazole (DEXILANT) 60 MG capsule TAKE 1 CAPSULE BY MOUTH ONCE A DAY 01/24/22 01/24/23  Carlyle Basques, MD  ?hydrocortisone 2.5 % lotion Apply topically.    [provider]  ?ibuprofen (ADVIL) 800 MG tablet Take 1 tablet by mouth every 8 (eight) hours as needed. 01/24/22   Carlyle Basques, MD  ?lamoTRIgine (LAMICTAL) 200 MG tablet TAKE 1 TABLET BY MOUTH ONCE A DAY 11/23/20 01/24/22  Ronnie Doss M, DO  ?levocetirizine (XYZAL) 5 MG tablet TAKE 1 TABLET BY MOUTH EVERY EVENING ?Patient not taking: Reported on 03/16/2021 05/10/20 05/10/21  Kuppelweiser, Gillian Shields, RPH-CPP  ?linaclotide (LINZESS) 290 MCG CAPS capsule Take 1 capsule by mouth daily on empty stomach 01/24/22   Carlyle Basques, MD  ?nystatin (MYCOSTATIN/NYSTOP) powder Apply topically 3 times daily for up to 2 weeks for rash under breast. 01/24/22   Carlyle Basques, MD  ?ondansetron (ZOFRAN-ODT) 4 MG disintegrating tablet Take 4 mg by mouth 3 (three) times daily as needed. 01/10/22   [provider]  ?propranolol (INDERAL) 10 MG tablet Take 10 mg by mouth 2 (two) times daily. 09/23/21   [provider]  ?QUEtiapine (SEROQUEL) 200 MG tablet Take 400 mg by mouth at bedtime. 01/13/22   [provider]  ?QUEtiapine (SEROQUEL) 200 MG tablet Take 2 tablets by mouth at bedtime 01/13/22     ?sertraline (ZOLOFT) 100 MG tablet TAKE 2 TABLETS BY MOUTH ONCE DAILY 09/10/20 01/24/22    ?Study - REPRIEVE [phone] - pitavastatin 4 mg or placebo tablet (  PI-[place]) Take 1 tablet by mouth daily.  [id number] I [id number] E ?ACTG X4801 03/10/21   Robert Bellow, PA-C  ?   ? ?Allergies    ?Flecainide, Naproxen, Piroxicam, Abilify [aripiprazole], Citalopram, Codeine, Depakote [divalproex sodium], Gabapentin, Haldol [haloperidol lactate], Haloperidol, Indomethacin, Invega [paliperidone], Latuda [lurasidone hcl], Lithium, Lurasidone, Promethazine, Trazodone and nefazodone, and Lexapro [escitalopram oxalate]    ? ?Review of Systems   ?Review of Systems  ?Respiratory:  Negative for shortness of breath.   ?Cardiovascular:  Positive for chest pain.  ?Gastrointestinal:  Negative for abdominal pain.  ?Neurological:  Negative for headaches.  ?All other systems reviewed and are negative. ? ?Physical Exam ?Updated Vital Signs ?BP (!) 177/102   Pulse 62   Temp 98.2 ?F (36.8 ?C)   Resp 16   Ht '5\' 6"'$  (1.676 m)   Wt 111.1 kg   LMP 08/11/2014   SpO2 99%   BMI 39.54 kg/m?  ?Physical Exam ?Vitals and nursing note reviewed.  ?Constitutional:   ?   Appearance: She is well-developed.  ?HENT:  ?   Head: Normocephalic.  ?   Mouth/Throat:  ?   Mouth: Mucous membranes are moist.  ?Eyes:  ?   Pupils: Pupils are equal, round, and reactive to light.  ?Cardiovascular:  ?   Rate and Rhythm: Normal rate.  ?Pulmonary:  ?   Effort: Pulmonary effort is normal.  ?Abdominal:  ?   General: There is no distension.  ?Musculoskeletal:     ?   General: Normal range of motion.  ?   Cervical back: Normal range of motion.  ?Skin: ?   General: Skin is warm.  ?Neurological:  ?   General: No focal deficit present.  ?   Mental Status: She is alert and oriented to person, place, and time.  ? ? ?ED Results / Procedures / Treatments   ?Labs ?(all labs ordered are listed, but only abnormal results are displayed) ?Labs Reviewed - No data to display ? ?EKG ?None ? ?Radiology ?No results found. ? ?Procedures ?Procedures  ? ? ?Medications Ordered in ED ?Medications  ?losartan (COZAAR) tablet 50 mg (50 mg Oral Given 02/21/22 2019)  ? ? ?ED Course/ Medical Decision Making/ A&P ?  ?                        ?Medical Decision Making ?Risk ?Prescription drug management. ? ? ?MDM:  Pt given a dosage of her medication. Rx.  ? ? ? ? ? ? ? ?Final Clinical Impression(s) / ED Diagnoses ?Final diagnoses:  ?Hypertension, unspecified type  ? ? ?Rx / DC Orders ?ED Discharge Orders   ? ?      Ordered  ?  losartan (COZAAR) 50 MG tablet  Daily       ? 02/21/22 2005  ? ?  ?  ? ?  ? ?An  After Visit Summary was printed and given to the patient.  ?  ?Fransico Meadow, PA-C ?02/24/22 6553 ? ?  ?Varney Biles, MD ?03/03/22 1133 ? ?

## 2022-03-01 ENCOUNTER — Other Ambulatory Visit (HOSPITAL_COMMUNITY): Payer: Self-pay

## 2022-03-02 ENCOUNTER — Encounter: Payer: Self-pay | Admitting: Internal Medicine

## 2022-03-03 ENCOUNTER — Other Ambulatory Visit (HOSPITAL_COMMUNITY): Payer: Self-pay

## 2022-03-07 ENCOUNTER — Ambulatory Visit: Payer: Medicaid Other | Admitting: Podiatry

## 2022-03-09 ENCOUNTER — Other Ambulatory Visit (HOSPITAL_COMMUNITY): Payer: Self-pay

## 2022-03-10 ENCOUNTER — Other Ambulatory Visit (HOSPITAL_COMMUNITY): Payer: Self-pay

## 2022-03-14 ENCOUNTER — Other Ambulatory Visit: Payer: Self-pay

## 2022-03-14 ENCOUNTER — Telehealth: Payer: Self-pay

## 2022-03-14 DIAGNOSIS — B20 Human immunodeficiency virus [HIV] disease: Secondary | ICD-10-CM

## 2022-03-14 MED ORDER — BICTEGRAVIR-EMTRICITAB-TENOFOV 50-200-25 MG PO TABS: 1.0000 | ORAL_TABLET | Freq: Every day | ORAL | 0 refills | Status: DC

## 2022-03-14 NOTE — Telephone Encounter (Signed)
Rachel Underwood with [facility] called requesting a refill on patient's Biktarvy. Patient is currently incarcerated and next court date is not until 04/2022. I have sent in a 90 day supply of Biktarvy to the pharmacy for the patient to [facility] in [place].  ?Andrew Soria T Quintan Saldivar ? ?

## 2022-03-21 ENCOUNTER — Inpatient Hospital Stay (HOSPITAL_COMMUNITY): Admission: RE | Admit: 2022-03-21 | Payer: Medicaid Other | Source: Ambulatory Visit

## 2022-03-21 ENCOUNTER — Ambulatory Visit (HOSPITAL_COMMUNITY): Payer: Medicaid Other

## 2022-03-22 ENCOUNTER — Telehealth: Payer: Self-pay

## 2022-03-22 NOTE — Telephone Encounter (Signed)
Staff faxed medication list and future appointments to [facility] Coordinator at [phone] ? ?Rachel Underwood ? ?

## 2022-03-22 NOTE — Telephone Encounter (Signed)
From:  ?Rachel Underwood ?Good morning, ? ?I got a call this morning from the [facility] jail nurse that Rachel Underwood is incarcerated (has been since Indie 5).  She is asking for a most recent medication list.  Can you fax her the latest office note that has a list of current medications?  Her name is Rachel Underwood and the fax number is [phone].  She said this is a secure fax number.  If you need her phone number for any reason it is [phone].  ? ?Thanks so much ? ?Rachel Underwood ? ? ?Rachel Underwood, BSW, CSW, [place] ? ?Case Manager ? ?[facility] ? ?[address] ? ?[address] ? ?[place], [place]  [address] ? ?Direct Dial: [phone] Fax: [phone] ?Website:  [email or web address] ?

## 2022-03-23 ENCOUNTER — Other Ambulatory Visit: Payer: Self-pay

## 2022-03-23 ENCOUNTER — Other Ambulatory Visit (HOSPITAL_COMMUNITY): Payer: Self-pay

## 2022-03-23 MED ORDER — PROPRANOLOL HCL 10 MG PO TABS
10.0000 mg | ORAL_TABLET | Freq: Two times a day (BID) | ORAL | 5 refills | Status: DC
  Filled 2022-03-23: qty 30, 15d supply, fill #0
  Filled 2022-05-09: qty 30, 15d supply, fill #1
  Filled 2022-06-13: qty 30, 15d supply, fill #2

## 2022-03-24 ENCOUNTER — Other Ambulatory Visit (HOSPITAL_COMMUNITY): Payer: Self-pay

## 2022-04-17 ENCOUNTER — Encounter (HOSPITAL_COMMUNITY): Payer: Self-pay

## 2022-04-17 ENCOUNTER — Other Ambulatory Visit: Payer: Self-pay

## 2022-04-17 ENCOUNTER — Emergency Department (HOSPITAL_COMMUNITY)
Admission: EM | Admit: 2022-04-17 | Discharge: 2022-04-17 | Disposition: A | Discharge status: Home / Self Care | Payer: Medicaid Other | Attending: Emergency Medicine | Admitting: Emergency Medicine

## 2022-04-17 ENCOUNTER — Emergency Department (HOSPITAL_COMMUNITY): Payer: Medicaid Other

## 2022-04-17 ENCOUNTER — Ambulatory Visit: Payer: Medicaid Other | Admitting: Internal Medicine

## 2022-04-17 DIAGNOSIS — M25462 Effusion, left knee: Secondary | ICD-10-CM | POA: Diagnosis not present

## 2022-04-17 DIAGNOSIS — M25562 Pain in left knee: Secondary | ICD-10-CM | POA: Diagnosis present

## 2022-04-17 DIAGNOSIS — Z21 Asymptomatic human immunodeficiency virus [HIV] infection status: Secondary | ICD-10-CM | POA: Insufficient documentation

## 2022-04-17 MED ORDER — OXYCODONE-ACETAMINOPHEN 5-325 MG PO TABS
1.0000 | ORAL_TABLET | Freq: Four times a day (QID) | ORAL | 0 refills | Status: DC | PRN
Start: 2022-04-17 — End: 2022-05-18

## 2022-04-17 MED ORDER — OXYCODONE-ACETAMINOPHEN 5-325 MG PO TABS
1.0000 | ORAL_TABLET | Freq: Once | ORAL | Status: AC
  Administered 2022-04-17: 1 via ORAL
  Filled 2022-04-17: qty 1

## 2022-04-17 NOTE — Discharge Instructions (Signed)
Follow-up with your orthopedic doctor

## 2022-04-17 NOTE — ED Triage Notes (Signed)
Patient reports chronic knee pain. States that she sees ortho and needs sx but has not mets requirements yet. States that toes are going numb now.  ?

## 2022-04-17 NOTE — ED Provider Notes (Signed)
?[facility] ?Provider Note ? ? ?CSN: [id number] ?Arrival date & time: 04/17/22  2831 ? ?  ? ?History ? ?Chief Complaint  ?Patient presents with  ? Knee Pain  ? ? ?Rachel Underwood is a 51 y.o. female. ? ?Patient with a history of HIV and obesity and chronic knee pain.  Patient complains of worsening left knee pain ? ?The history is provided by the patient and medical records. No language interpreter was used.  ?Knee Pain ?Location:  Knee ?Knee location:  L knee ?Pain details:  ?  Quality:  Aching ?  Radiates to:  Does not radiate ?  Severity:  Moderate ?  Onset quality:  Gradual ?  Timing:  Constant ?  Progression:  Worsening ?Chronicity:  Recurrent ?Dislocation: no   ?Associated symptoms: no back pain and no fatigue   ? ?  ? ?Home Medications ?Prior to Admission medications   ?Medication Sig Start Date End Date Taking? Authorizing Provider  ?oxyCODONE-acetaminophen (PERCOCET) 5-325 MG tablet Take 1 tablet by mouth every 6 (six) hours as needed for moderate pain. 04/17/22  Yes Milton Ferguson, MD  ?acyclovir (ZOVIRAX) 400 MG tablet TAKE 1 TABLET BY MOUTH 2 TIMES DAILY AS NEEDED FOR PROPHYLAXIS 01/24/22 01/24/23  Carlyle Basques, MD  ?albuterol (VENTOLIN HFA) 108 (90 Base) MCG/ACT inhaler INHALE 2 PUFFS INTO THE LUNGS EVERY 4 HOURS AS NEEDED FOR WHEEZING OR SHORTNESS OF BREATH. 02/14/21 02/14/22  Carlyle Basques, MD  ?atenolol (TENORMIN) 25 MG tablet Take 1 tablet (25 mg total) by mouth daily. ?Patient not taking: Reported on 01/24/2022 11/11/20   Evalee Jefferson, PA-C  ?azelastine (ASTELIN) 0.1 % nasal spray Place 1 spray into both nostrils 2 (two) times daily. ?Patient not taking: Reported on 03/08/2021 10/12/20   Janora Norlander, DO  ?bictegravir-emtricitabine-tenofovir AF (BIKTARVY) 50-200-25 MG TABS tablet Take 1 tablet by mouth daily. 03/14/22 03/14/23  Carlyle Basques, MD  ?cromolyn (OPTICROM) 4 % ophthalmic solution 1 drop 2 (two) times daily. 01/04/21   [provider]  ?cycloSPORINE (RESTASIS) 0.05  % ophthalmic emulsion  08/15/19   [provider]  ?dexlansoprazole (DEXILANT) 60 MG capsule TAKE 1 CAPSULE BY MOUTH ONCE A DAY 01/24/22 01/24/23  Carlyle Basques, MD  ?hydrocortisone 2.5 % lotion Apply topically.    [provider]  ?ibuprofen (ADVIL) 800 MG tablet Take 1 tablet by mouth every 8 (eight) hours as needed. 01/24/22   Carlyle Basques, MD  ?lamoTRIgine (LAMICTAL) 200 MG tablet TAKE 1 TABLET BY MOUTH ONCE A DAY 11/23/20 01/24/22  Ronnie Doss M, DO  ?levocetirizine (XYZAL) 5 MG tablet TAKE 1 TABLET BY MOUTH EVERY EVENING ?Patient not taking: Reported on 03/16/2021 05/10/20 05/10/21  Kuppelweiser, Gillian Shields, RPH-CPP  ?linaclotide (LINZESS) 290 MCG CAPS capsule Take 1 capsule by mouth daily on empty stomach 01/24/22   Carlyle Basques, MD  ?losartan (COZAAR) 50 MG tablet Take 1 tablet (50 mg total) by mouth daily. 02/21/22 02/21/23  Fransico Meadow, PA-C  ?nystatin (MYCOSTATIN/NYSTOP) powder Apply topically 3 times daily for up to 2 weeks for rash under breast. 01/24/22   Carlyle Basques, MD  ?ondansetron (ZOFRAN-ODT) 4 MG disintegrating tablet Take 4 mg by mouth 3 (three) times daily as needed. 01/10/22   [provider]  ?propranolol (INDERAL) 10 MG tablet Take 1 tablet by mouth 2 times daily. 03/23/22   Carlyle Basques, MD  ?QUEtiapine (SEROQUEL) 200 MG tablet Take 400 mg by mouth at bedtime. 01/13/22   [provider]  ?QUEtiapine (SEROQUEL) 200 MG tablet Take  2 tablets by mouth at bedtime 01/13/22     ?sertraline (ZOLOFT) 100 MG tablet TAKE 2 TABLETS BY MOUTH ONCE DAILY 09/10/20 01/24/22    ?Study - REPRIEVE [phone] - pitavastatin 4 mg or placebo tablet ([place]) Take 1 tablet by mouth daily.  [id number] I [id number] E ?ACTG E2800 03/10/21   Robert Bellow, PA-C  ?   ? ?Allergies    ?Flecainide, Naproxen, Piroxicam, Abilify [aripiprazole], Citalopram, Codeine, Depakote [divalproex sodium], Gabapentin, Haldol [haloperidol lactate], Haloperidol, Indomethacin, Invega [paliperidone], Latuda  [lurasidone hcl], Lithium, Lurasidone, Promethazine, Trazodone and nefazodone, and Lexapro [escitalopram oxalate]   ? ?Review of Systems   ?Review of Systems  ?Constitutional:  Negative for appetite change and fatigue.  ?HENT:  Negative for congestion, ear discharge and sinus pressure.   ?Eyes:  Negative for discharge.  ?Respiratory:  Negative for cough.   ?Cardiovascular:  Negative for chest pain.  ?Gastrointestinal:  Negative for abdominal pain and diarrhea.  ?Genitourinary:  Negative for frequency and hematuria.  ?Musculoskeletal:  Negative for back pain.  ?     Left knee pain  ?Skin:  Negative for rash.  ?Neurological:  Negative for seizures and headaches.  ?Psychiatric/Behavioral:  Negative for hallucinations.   ? ?Physical Exam ?Updated Vital Signs ?BP (!) 148/92   Pulse 73   Temp 98.1 ?F (36.7 ?C) (Oral)   Resp 18   Ht '5\' 6"'$  (1.676 m)   Wt 112.9 kg   LMP 08/11/2014   SpO2 99%   BMI 40.19 kg/m?  ?Physical Exam ?Vitals and nursing note reviewed.  ?Constitutional:   ?   Appearance: She is well-developed.  ?HENT:  ?   Head: Normocephalic.  ?   Nose: Nose normal.  ?Eyes:  ?   General: No scleral icterus. ?   Conjunctiva/sclera: Conjunctivae normal.  ?Neck:  ?   Thyroid: No thyromegaly.  ?Cardiovascular:  ?   Rate and Rhythm: Normal rate and regular rhythm.  ?   Heart sounds: No murmur heard. ?  No friction rub. No gallop.  ?Pulmonary:  ?   Breath sounds: No stridor. No wheezing or rales.  ?Chest:  ?   Chest wall: No tenderness.  ?Abdominal:  ?   General: There is no distension.  ?   Tenderness: There is no abdominal tenderness. There is no rebound.  ?Musculoskeletal:     ?   General: Normal range of motion.  ?   Cervical back: Neck supple.  ?   Comments: Mild tenderness and swelling to left knee  ?Lymphadenopathy:  ?   Cervical: No cervical adenopathy.  ?Skin: ?   Findings: No erythema or rash.  ?Neurological:  ?   Mental Status: She is alert and oriented to person, place, and time.  ?   Motor: No abnormal  muscle tone.  ?   Coordination: Coordination normal.  ?Psychiatric:     ?   Behavior: Behavior normal.  ? ? ?ED Results / Procedures / Treatments   ?Labs ?(all labs ordered are listed, but only abnormal results are displayed) ?Labs Reviewed - No data to display ? ?EKG ?None ? ?Radiology ?DG Knee Complete 4 Views Left ? ?Result Date: 04/17/2022 ?CLINICAL DATA:  Chronic lateral knee pain. EXAM: LEFT KNEE - COMPLETE 4+ VIEW COMPARISON:  None Available. FINDINGS: No evidence of fracture, dislocation, or visible joint effusion. 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 Soft tissues are unremarkable. IMPRESSION: No acute osseous abnormality. Tricompartment DJD . Electronically Signed   By: Dellis Filbert  Nance Pew M.D.   On: 04/17/2022 08:06   ? ?Procedures ?Procedures  ? ? ?Medications Ordered in ED ?Medications  ?oxyCODONE-acetaminophen (PERCOCET/ROXICET) 5-325 MG per tablet 1 tablet (1 tablet Oral Given 04/17/22 0744)  ? ? ?ED Course/ Medical Decision Making/ A&P ?  ?                        ?Medical Decision Making ?Amount and/or Complexity of Data Reviewed ?Radiology: ordered. ? ?Risk ?Prescription drug management. ? ?This patient presents to the ED for concern of left knee pain, this involves an extensive number of treatment options, and is a complaint that carries with it a high risk of complications and morbidity.  The differential diagnosis includes septic knee and worsening arthritic ? ? ?Co morbidities that complicate the patient evaluation ? ?HIV ? ? ?Additional history obtained: ? ?Additional history obtained from patient ?External records from outside source obtained and reviewed including hospital records ? ? ?Lab Tests: ? ?No lab test ? ?Imaging Studies ordered: ? ?I ordered imaging studies including x-ray left knee ?I independently visualized and interpreted imaging which showed severe degenerative joint disease ?I agree with the radiologist interpretation ? ? ?Cardiac  Monitoring: / EKG: ? ?The patient was maintained on a cardiac monitor.  I personally viewed and interpreted the cardiac monitored which showed an underlying rhythm of: Normal sinus rhythm ? ? ?Consultations Ob

## 2022-05-09 ENCOUNTER — Other Ambulatory Visit (HOSPITAL_COMMUNITY): Payer: Self-pay

## 2022-05-09 ENCOUNTER — Other Ambulatory Visit: Payer: Self-pay | Admitting: Internal Medicine

## 2022-05-09 ENCOUNTER — Other Ambulatory Visit: Payer: Self-pay

## 2022-05-09 DIAGNOSIS — B20 Human immunodeficiency virus [HIV] disease: Secondary | ICD-10-CM

## 2022-05-09 MED ORDER — BICTEGRAVIR-EMTRICITAB-TENOFOV 50-200-25 MG PO TABS
1.0000 | ORAL_TABLET | Freq: Every day | ORAL | 3 refills | Status: DC
  Filled 2022-05-09: qty 30, 30d supply, fill #0
  Filled 2022-06-13: qty 30, 30d supply, fill #1

## 2022-05-16 ENCOUNTER — Other Ambulatory Visit (HOSPITAL_COMMUNITY): Payer: Self-pay

## 2022-05-16 ENCOUNTER — Ambulatory Visit: Payer: Medicaid Other

## 2022-05-16 ENCOUNTER — Other Ambulatory Visit: Payer: Self-pay

## 2022-05-16 NOTE — Progress Notes (Unsigned)
Therapist met with client as scheduled to continue rapport building. Therapist introduced and shared some information about herself and encouraged the same from client in order to build trust and openness within the counseling relationship. Therapist discussed and reviewed treatment plans with client based on the result from client's assessment. Therapist discussed client current mental health symptoms /diagnosis of DSM-5, as well as recommendations and target population for various eligibility services. Therapist assessed for SI/HI during session and will follow-up with client during the next session.  Client attended session with therapist as scheduled and presented alert; orient X4. On start of session, client affect appeared euthymic affect was congruent with client report. Client was receptive to rapport building by sharing information about him/herself, such as: expressing likes, dislikes, and strengths. Client was able to make connections between consequences, challenges and alternative thoughts of mental health recovery. Client was not fully attentive during the session and had many distractions. However, client did disclose that she was currently homeless. Client progress toward therapeutic goal(s) are minimal at this time. Client denied SI/HI.

## 2022-05-18 ENCOUNTER — Other Ambulatory Visit (HOSPITAL_COMMUNITY)
Admission: RE | Admit: 2022-05-18 | Discharge: 2022-05-18 | Disposition: A | Discharge status: Home / Self Care | Payer: Medicaid Other | Source: Ambulatory Visit | Attending: Nurse Practitioner | Admitting: Nurse Practitioner

## 2022-05-18 ENCOUNTER — Other Ambulatory Visit (HOSPITAL_COMMUNITY): Payer: Self-pay

## 2022-05-18 ENCOUNTER — Ambulatory Visit (INDEPENDENT_AMBULATORY_CARE_PROVIDER_SITE_OTHER): Payer: Medicaid Other | Admitting: Nurse Practitioner

## 2022-05-18 ENCOUNTER — Other Ambulatory Visit: Payer: Self-pay | Admitting: Internal Medicine

## 2022-05-18 ENCOUNTER — Encounter: Payer: Self-pay | Admitting: Nurse Practitioner

## 2022-05-18 ENCOUNTER — Other Ambulatory Visit: Payer: Self-pay | Admitting: Nurse Practitioner

## 2022-05-18 ENCOUNTER — Other Ambulatory Visit (HOSPITAL_COMMUNITY): Payer: Self-pay | Admitting: Nurse Practitioner

## 2022-05-18 VITALS — BP 134/87 | HR 76 | Ht 65.0 in | Wt 215.0 lb

## 2022-05-18 DIAGNOSIS — F209 Schizophrenia, unspecified: Secondary | ICD-10-CM

## 2022-05-18 DIAGNOSIS — Z7251 High risk heterosexual behavior: Secondary | ICD-10-CM | POA: Insufficient documentation

## 2022-05-18 DIAGNOSIS — K2100 Gastro-esophageal reflux disease with esophagitis, without bleeding: Secondary | ICD-10-CM

## 2022-05-18 DIAGNOSIS — I471 Supraventricular tachycardia, unspecified: Secondary | ICD-10-CM

## 2022-05-18 DIAGNOSIS — I10 Essential (primary) hypertension: Secondary | ICD-10-CM

## 2022-05-18 DIAGNOSIS — F3170 Bipolar disorder, currently in remission, most recent episode unspecified: Secondary | ICD-10-CM

## 2022-05-18 DIAGNOSIS — N6320 Unspecified lump in the left breast, unspecified quadrant: Secondary | ICD-10-CM

## 2022-05-18 DIAGNOSIS — J301 Allergic rhinitis due to pollen: Secondary | ICD-10-CM

## 2022-05-18 DIAGNOSIS — Z720 Tobacco use: Secondary | ICD-10-CM

## 2022-05-18 DIAGNOSIS — J4520 Mild intermittent asthma, uncomplicated: Secondary | ICD-10-CM

## 2022-05-18 DIAGNOSIS — Z1231 Encounter for screening mammogram for malignant neoplasm of breast: Secondary | ICD-10-CM

## 2022-05-18 DIAGNOSIS — F1410 Cocaine abuse, uncomplicated: Secondary | ICD-10-CM

## 2022-05-18 DIAGNOSIS — J45909 Unspecified asthma, uncomplicated: Secondary | ICD-10-CM | POA: Insufficient documentation

## 2022-05-18 MED ORDER — SERTRALINE HCL 100 MG PO TABS
ORAL_TABLET | Freq: Every day | ORAL | 3 refills | Status: DC
  Filled 2022-05-18: qty 60, 30d supply, fill #0
  Filled 2022-06-13: qty 60, 30d supply, fill #1
  Filled 2022-07-18: qty 60, 30d supply, fill #2
  Filled 2022-08-16: qty 60, 30d supply, fill #3

## 2022-05-18 MED ORDER — CROMOLYN SODIUM 4 % OP SOLN
1.0000 [drp] | Freq: Every day | OPHTHALMIC | 3 refills | Status: DC
  Filled 2022-05-18: qty 10, 90d supply, fill #0
  Filled 2022-07-18 – 2022-08-16 (×2): qty 10, 90d supply, fill #1
  Filled 2022-12-17: qty 10, 90d supply, fill #2
  Filled 2023-03-14 – 2023-04-02 (×2): qty 10, 90d supply, fill #3

## 2022-05-18 MED ORDER — PANTOPRAZOLE SODIUM 40 MG PO TBEC
40.0000 mg | DELAYED_RELEASE_TABLET | Freq: Every day | ORAL | 3 refills | Status: DC
  Filled 2022-05-18: qty 30, 30d supply, fill #0

## 2022-05-18 MED ORDER — CYCLOSPORINE 0.05 % OP EMUL
1.0000 [drp] | Freq: Two times a day (BID) | OPHTHALMIC | 2 refills | Status: DC
  Filled 2022-05-18: qty 180, 90d supply, fill #0
  Filled 2022-07-18 – 2022-12-17 (×2): qty 180, 90d supply, fill #1

## 2022-05-18 MED ORDER — EPINEPHRINE 0.3 MG/0.3ML IJ SOAJ
0.3000 mg | Freq: Every day | INTRAMUSCULAR | 1 refills | Status: DC | PRN
  Filled 2022-05-18: qty 2, 30d supply, fill #0
  Filled 2022-06-14: qty 2, 2d supply, fill #0

## 2022-05-18 MED ORDER — LAMOTRIGINE 200 MG PO TABS
ORAL_TABLET | Freq: Every day | ORAL | 3 refills | Status: DC
  Filled 2022-05-18: qty 30, 30d supply, fill #0
  Filled 2022-05-23: qty 30, 30d supply, fill #1
  Filled 2022-06-13: qty 30, 30d supply, fill #2
  Filled 2022-07-18: qty 30, 30d supply, fill #3

## 2022-05-18 MED ORDER — ALBUTEROL SULFATE HFA 108 (90 BASE) MCG/ACT IN AERS
2.0000 | INHALATION_SPRAY | RESPIRATORY_TRACT | 2 refills | Status: DC | PRN
  Filled 2022-05-18: qty 18, 16d supply, fill #0
  Filled 2022-07-18: qty 18, 16d supply, fill #1
  Filled 2022-07-31: qty 18, 16d supply, fill #2

## 2022-05-18 MED ORDER — LINACLOTIDE 290 MCG PO CAPS
290.0000 ug | ORAL_CAPSULE | Freq: Every day | ORAL | 2 refills | Status: DC
  Filled 2022-05-18 – 2022-08-16 (×2): qty 30, 30d supply, fill #0
  Filled 2022-10-23: qty 30, 30d supply, fill #1
  Filled 2023-03-14 – 2023-03-30 (×2): qty 30, 30d supply, fill #2

## 2022-05-18 MED ORDER — LEVOCETIRIZINE DIHYDROCHLORIDE 5 MG PO TABS
5.0000 mg | ORAL_TABLET | Freq: Every evening | ORAL | 0 refills | Status: DC
  Filled 2022-05-18: qty 90, 90d supply, fill #0

## 2022-05-18 NOTE — Assessment & Plan Note (Signed)
Chronic condition well-controlled on Xyzal 5 mg daily Medications refilled today

## 2022-05-18 NOTE — Assessment & Plan Note (Signed)
States that she still use cocaine sometims, does not remember the last time she used cocaine. Followed by Physicians at [facility], would like a referal to a psychiatrist within [facility] system. Also attends counseling with the social worker at [place].  Denies SI, HI, patient requesting for refills of her Lamictal, Zoloft Follow 20 mg daily, Lamictal 200 mg daily refilled Denies SI, HI Referral sent to [facility] for psych referral

## 2022-05-18 NOTE — Assessment & Plan Note (Signed)
Currently on propanolol 10 mg twice daily No complaints of palpitations today continue current medication

## 2022-05-18 NOTE — Assessment & Plan Note (Signed)
States that she still use cocaine sometims, does not remember the last time she used cocaine. Followed by Physicians at [facility], would like a referal to a psychiatrist within [facility] system. Also attends counseling with the social worker at [place].  Denies SI, HI, patient requesting for refills of her Lamictal, Zoloft Follow 20 mg daily, Lamictal 200 mg daily refilled Denies SI, HI Referral sent to [facility] for psych referral

## 2022-05-18 NOTE — Assessment & Plan Note (Signed)
He has been out of Dexilant for a while  Dexilant 60 mg refilled today Avoid fried fatty foods, spicy foods, alcohol, caffeine

## 2022-05-18 NOTE — Progress Notes (Signed)
New Patient Office Visit  Subjective    Patient ID: Rachel Underwood, female    DOB: [birth year]  Age: 51 y.o. MRN: [id number]  CC:  Chief Complaint  Patient presents with   New Patient (Initial Visit)    np   Knee Pain    Rt knee pain for about 2 years     HPI Rachel Underwood with past medical history of hypertension, GERD, esophageal dysphagia, schizophrenia, bipolar disorder, constipation, fibromyalgia syndrome, tobacco abuse disorder, HIV presents to establish care for her chronic medical conditions.  previous PCP was at [facility], Last visit was over a year ago.    HIV.  Chronic condition followed by ID currently on Biktarvy, patient states that she has been taking medication daily as prescribed.  Denies fever chills malaise   Chronic left knee pain . She has recived steroid injections in the past, states that otho recommended knee replacement , has PT ordered, has upcoming appointment with them next week Tuesday. Her pain is 8/10 today describes it as sharp  aching  knee pops sometimes, she has tried aleeve and tylenol , ibuprofen helps her pain, states that she rarely takes ibuprofen.   Asthma. Well controlled, symptoms are worse in the spring and fall.  Needs refill of albuterol inhaler, Epi  injection   GERD. She has been out of dexillant 59m since the past couple of months, still has problems with acid reflux, , belching , gas, abdominal  pain .  Denies bloody stool, vomiting,   Tobacco abuse .Sometimes smokes 3 packs of cigartees daily , started smoking at age 51 smoking cessation education completed patient not ready to quit  Bipolar affective disorder, Anxiety. Cocaine use disorder, schizophrenia. States that she still use cocaine sometims, does not remember the last time she used cocaine. [facility] Also [facility] [place] social worker [facility] [place]   Denies SI, HI, patient requesting for refills of her Lamictal, Zoloft  Would like to do STD testing today, has 2 sexual partners, always uses condoms during sex, states that her partner are aware of her HIV status. Denies vaginitis, vaginal discharge.   Due for mammogram referral sent for mammogram today. Had had abdominal hysterectomy , up-to-date with colonoscopy.   Outpatient Encounter Medications as of 05/18/2022  Medication Sig   acyclovir (ZOVIRAX) 400 MG tablet TAKE 1 TABLET BY MOUTH 2 TIMES DAILY AS NEEDED FOR PROPHYLAXIS   bictegravir-emtricitabine-tenofovir AF (BIKTARVY) 50-200-25 MG TABS tablet Take 1 tablet by mouth daily.   bictegravir-emtricitabine-tenofovir AF (BIKTARVY) 50-200-25 MG TABS tablet TAKE 1 TABLET BY MOUTH DAILY.   hydrocortisone 2.5 % lotion Apply topically.   ibuprofen (ADVIL) 800 MG tablet Take 1 tablet by mouth every 8 (eight) hours as needed.   losartan (COZAAR) 50 MG tablet Take 1 tablet (50 mg total) by mouth daily.   nystatin (MYCOSTATIN/NYSTOP) powder Apply topically 3 times daily for up to 2 weeks for rash under breast.   propranolol (INDERAL) 10 MG tablet Take 1 tablet by mouth 2 times daily.   QUEtiapine (SEROQUEL) 200 MG tablet Take 2 tablets by mouth at bedtime   Study - REPRIEVE [id number]- pitavastatin 4 mg or placebo tablet ([facility]-[place]) Take 1 tablet by mouth daily.  [id number]  [DISCONTINUED] atenolol (TENORMIN) 25 MG tablet Take 1 tablet (25 mg total) by mouth daily.   [DISCONTINUED] EPINEPHrine 0.3 mg/0.3 mL  IJ SOAJ injection Inject into the skin.   [DISCONTINUED] lamoTRIgine (LAMICTAL) 200 MG tablet Take by mouth.   [DISCONTINUED] levocetirizine (XYZAL) 5 MG tablet Take 1 tablet by mouth every evening.   [DISCONTINUED] linaclotide (LINZESS) 290 MCG CAPS capsule Take 1 capsule by mouth daily on empty stomach   albuterol (VENTOLIN HFA) 108 (90 Base) MCG/ACT inhaler INHALE 2 PUFFS INTO THE LUNGS EVERY 4 HOURS AS NEEDED FOR WHEEZING  OR SHORTNESS OF BREATH.   azelastine (ASTELIN) 0.1 % nasal spray Place 1 spray into both nostrils 2 (two) times daily. (Patient not taking: Reported on 03/08/2021)   cromolyn (OPTICROM) 4 % ophthalmic solution Place 1 drop into both eyes daily.   cycloSPORINE (RESTASIS) 0.05 % ophthalmic emulsion Place 1 drop into both eyes 2 (two) times daily.   dexlansoprazole (DEXILANT) 60 MG capsule TAKE 1 CAPSULE BY MOUTH ONCE A DAY (Patient not taking: Reported on 05/18/2022)   EPINEPHrine 0.3 mg/0.3 mL IJ SOAJ injection Inject 0.3 mg into the skin daily as needed for anaphylaxis.   lamoTRIgine (LAMICTAL) 200 MG tablet TAKE 1 TABLET BY MOUTH ONCE A DAY   levocetirizine (XYZAL) 5 MG tablet Take 1 tablet (5 mg total) by mouth every evening.   linaclotide (LINZESS) 290 MCG CAPS capsule Take 1 capsule by mouth daily on empty stomach   ondansetron (ZOFRAN-ODT) 4 MG disintegrating tablet Take 4 mg by mouth 3 (three) times daily as needed. (Patient not taking: Reported on 05/18/2022)   sertraline (ZOLOFT) 100 MG tablet TAKE 2 TABLETS BY MOUTH ONCE DAILY   [DISCONTINUED] albuterol (VENTOLIN HFA) 108 (90 Base) MCG/ACT inhaler INHALE 2 PUFFS INTO THE LUNGS EVERY 4 HOURS AS NEEDED FOR WHEEZING OR SHORTNESS OF BREATH.   [DISCONTINUED] cromolyn (OPTICROM) 4 % ophthalmic solution 1 drop 2 (two) times daily. (Patient not taking: Reported on 05/18/2022)   [DISCONTINUED] cycloSPORINE (RESTASIS) 0.05 % ophthalmic emulsion  (Patient not taking: Reported on 05/18/2022)   [DISCONTINUED] lamoTRIgine (LAMICTAL) 200 MG tablet TAKE 1 TABLET BY MOUTH ONCE A DAY   [DISCONTINUED] levocetirizine (XYZAL) 5 MG tablet TAKE 1 TABLET BY MOUTH EVERY EVENING (Patient not taking: Reported on 03/16/2021)   [DISCONTINUED] oxyCODONE-acetaminophen (PERCOCET) 5-325 MG tablet Take 1 tablet by mouth every 6 (six) hours as needed for moderate pain. (Patient not taking: Reported on 05/18/2022)   [DISCONTINUED] QUEtiapine (SEROQUEL) 200 MG tablet Take 400 mg by  mouth at bedtime. (Patient not taking: Reported on 05/18/2022)   [DISCONTINUED] sertraline (ZOLOFT) 100 MG tablet TAKE 2 TABLETS BY MOUTH ONCE DAILY   No facility-administered encounter medications on file as of 05/18/2022.    Past Medical History:  Diagnosis Date   Abnormal Pap smear of cervix 2005(EST)   ckc for abnl pap- [facility] [place]   Allergy    Asthma    Bipolar 1 disorder ([place])    Borderline personality disorder ([place])    BPD (bronchopulmonary dysplasia)    Carpal tunnel syndrome    bilateral wrist   Depression    Dysmenorrhea 06/03/2014   Enlarged uterus 06/03/2014   Fibromyalgia    GERD (gastroesophageal reflux disease)    Herpes simplex without mention of complication    HIV (human immunodeficiency virus infection) ([place])    Hyperlipidemia    Hypertension    Menorrhagia with irregular cycle 06/03/2014   Osteoarthritis    Seizures ([place])    had as a teenager, unknown etiology and no meds.    Past Surgical History:  Procedure Laterality Date   ABDOMINAL HYSTERECTOMY  N/A 09/01/2014   Procedure: HYSTERECTOMY ABDOMINAL;  Surgeon: Jonnie Kind, MD;  Location: [facility];  Service: Gynecology;  Laterality: N/A;   ANKLE SURGERY Right    repair of ligaments   BACK SURGERY     BILATERAL SALPINGECTOMY Bilateral 09/01/2014   Procedure: BILATERAL SALPINGECTOMY;  Surgeon: Jonnie Kind, MD;  Location: [facility];  Service: Gynecology;  Laterality: Bilateral;   CESAREAN SECTION     ckc     COLONOSCOPY N/A 12/23/2014   ZOX:WRUE canal hemorrhoids left-sided diverticula   ESOPHAGOGASTRODUODENOSCOPY N/A 12/23/2014   AVW:[id number] reflux/noncritical shatzkis ring s/p dilation/small HH   EXCISION OF SKIN TAG N/A 09/01/2014   Procedure: EXCISION OF SKIN NEVUS;  Surgeon: Jonnie Kind, MD;  Location: [facility];  Service: Gynecology;  Laterality: N/A;   FRACTURE SURGERY     MALONEY DILATION N/A 12/23/2014   Procedure: Venia Minks DILATION;  Surgeon: Daneil Dolin, MD;  Location: [facility];  Service: Endoscopy;  Laterality: N/A;   SAVORY DILATION N/A 12/23/2014   Procedure: SAVORY DILATION;  Surgeon: Daneil Dolin, MD;  Location: [facility];  Service: Endoscopy;  Laterality: N/A;   SCAR REVISION N/A 09/01/2014   Procedure: EXCISION OF CICATRIX;  Surgeon: Jonnie Kind, MD;  Location: [facility];  Service: Gynecology;  Laterality: N/A;   SHOULDER SURGERY Right    rotator cuff   SPINE SURGERY      Family History  Problem Relation Age of Onset   Fibroids Mother    Hypertension Mother    Depression Mother    Irritable bowel syndrome Mother    Arthritis Mother    Heart disease Mother    Mental illness Mother    Stroke Mother    Vision loss Mother    Lupus Mother    Hypertension Father    Mental illness Father    Arthritis Father    Heart disease Father    Vision loss Father    Heart disease Maternal Grandmother    Cancer Maternal Grandmother        breast   Depression Maternal Grandmother    Hyperlipidemia Maternal Grandmother    Hypertension Maternal Grandmother    Bipolar disorder Daughter    Schizophrenia Daughter    Lupus Daughter    Cancer Paternal Grandfather        prostate   Heart disease Paternal Grandfather    Hyperlipidemia Paternal Grandfather    Hypertension Paternal Grandfather    Dementia Paternal Grandmother    Hyperlipidemia Paternal Grandmother    Hypertension Paternal Grandmother    Heart disease Maternal Grandfather    Alzheimer's disease Maternal Grandfather    Hyperlipidemia Maternal Grandfather    Hypertension Maternal Grandfather    Anxiety disorder Daughter    ADD / ADHD Son    Hypertension Brother    Early death Brother    Learning disabilities Brother    Mental illness Brother    Colon cancer Neg Hx     Social History   Socioeconomic History   Marital status: Divorced    Spouse name: Not on file   Number of children: 3   Years of education: Not on file   Highest education level: Not on file  Occupational  History   Not on file  Tobacco Use   Smoking status: Every Day    Packs/day: 0.30    Years: 30.00    Total pack years: 9.00    Types: Cigarettes    Start date: 12/04/1982  Smokeless tobacco: Never   Tobacco comments:    States she is cutting back  Vaping Use   Vaping Use: Never used  Substance and Sexual Activity   Alcohol use: Not Currently    Comment: Very arely   Drug use: No    Types: "Crack" cocaine    Comment: 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 x 2.5 weeks   Sexual activity: Yes    Partners: Male    Birth control/protection: Condom  Other Topics Concern   Not on file  Social History Narrative   Lives with a friend at the moment    Social Determinants of Health   Financial Resource Strain: Not on file  Food Insecurity: Not on file  Transportation Needs: Not on file  Physical Activity: Not on file  Stress: Not on file  Social Connections: Not on file  Intimate Partner Violence: Not on file    Review of Systems  Constitutional: Negative.  Negative for chills, fever, malaise/fatigue and weight loss.  Respiratory: Negative.  Negative for cough, hemoptysis and sputum production.   Cardiovascular: Negative.  Negative for chest pain, palpitations, orthopnea and claudication.  Gastrointestinal:  Positive for abdominal pain, constipation, heartburn and nausea. Negative for blood in stool, diarrhea, melena and vomiting.  Musculoskeletal:  Positive for joint pain. Negative for back pain, myalgias and neck pain.  Neurological:  Negative for dizziness, tingling, tremors, sensory change and headaches.  Psychiatric/Behavioral:  Positive for substance abuse. Negative for depression, hallucinations, memory loss and suicidal ideas. The patient is not nervous/anxious and does not have insomnia.         Objective    BP 134/87 (BP Location: Right Arm, Patient Position: Sitting, Cuff Size: Large)   Pulse 76   Ht '5\' 5"'  (1.651 m)   Wt 215 lb (97.5 kg)   LMP 08/11/2014    SpO2 97%   BMI 35.78 kg/m   Physical Exam Constitutional:      General: She is not in acute distress.    Appearance: She is obese. She is not ill-appearing, toxic-appearing or diaphoretic.  Cardiovascular:     Rate and Rhythm: Normal rate and regular rhythm.     Pulses: Normal pulses.     Heart sounds: Normal heart sounds. No murmur heard.    No friction rub. No gallop.  Pulmonary:     Effort: Pulmonary effort is normal. No respiratory distress.     Breath sounds: Normal breath sounds. No stridor. No wheezing, rhonchi or rales.  Chest:     Chest wall: No tenderness.  Abdominal:     Palpations: Abdomen is soft.  Musculoskeletal:        General: Normal range of motion.     Comments: Tenderness on range of motion of left knee, skin warm and dry, has varicose veins bilaterally  Skin:    Capillary Refill: Capillary refill takes less than 2 seconds.  Neurological:     Mental Status: She is alert and oriented to person, place, and time.  Psychiatric:        Mood and Affect: Mood normal.        Thought Content: Thought content normal.        Judgment: Judgment normal.         Assessment & Plan:   Problem List Items Addressed This Visit       Cardiovascular and Mediastinum   HTN (hypertension)    BP Readings from Last 3 Encounters:  05/18/22 134/87  04/17/22 (!) 148/92  02/21/22 (!) 177/102  Chronic condition well-controlled on losartan 50 mg daily, propanolol 10 mg 2 times daily DASH diet advised engage in regular vigorous exercise with at least 150 minutes weekly as tolerated Lab Results  Component Value Date   NA 139 01/24/2022   K 4.8 01/24/2022   CO2 30 01/24/2022   GLUCOSE 82 01/24/2022   BUN 18 01/24/2022   CREATININE 0.81 01/24/2022   CALCIUM 9.6 01/24/2022   EGFR 89 01/24/2022   GFRNONAA >60 11/10/2020       Relevant Medications   EPINEPHrine 0.3 mg/0.3 mL IJ SOAJ injection   PSVT (paroxysmal supraventricular tachycardia) (HCC)    Currently on  propanolol 10 mg twice daily No complaints of palpitations today continue current medication      Relevant Medications   EPINEPHrine 0.3 mg/0.3 mL IJ SOAJ injection     Respiratory   Allergic rhinitis due to pollen    Chronic condition well-controlled on Xyzal 5 mg daily Medications refilled today      Relevant Medications   albuterol (VENTOLIN HFA) 108 (90 Base) MCG/ACT inhaler     Digestive   GERD (gastroesophageal reflux disease)    He has been out of Dexilant for a while  Dexilant 60 mg refilled today Avoid fried fatty foods, spicy foods, alcohol, caffeine      Relevant Medications   linaclotide (LINZESS) 290 MCG CAPS capsule     Other   Schizophrenia ([place])    States that she still use cocaine sometims, does not remember the last time she used cocaine. Followed by Physicians at [facility], would like a referal to a psychiatrist within [facility]. Also attends counseling with the social worker at [place].  Denies SI, HI, patient requesting for refills of her Lamictal, Zoloft Follow 20 mg daily, Lamictal 200 mg daily refilled Denies SI, HI Referral sent to [facility] for psych referral       Bipolar disorder, unspecified ([place])    States that she still use cocaine sometims, does not remember the last time she used cocaine. Followed by Physicians at [facility], would like a referal to a psychiatrist within [facility]. Also attends counseling with the social worker at [place].  Denies SI, HI, patient requesting for refills of her Lamictal, Zoloft Follow 20 mg daily, Lamictal 200 mg daily refilled Denies SI, HI Referral sent to [facility] for psych referral      Relevant Medications   lamoTRIgine (LAMICTAL) 200 MG tablet   Other Relevant Orders   AMB Referral to Community Care Coordinaton   Tobacco abuse disorder    Smokes about 3 pack/day  Asked about quitting: confirms that he/she currently smokes cigarettes Advise to quit smoking: Educated about QUITTING to reduce the  risk of cancer, cardio and cerebrovascular disease. Assess willingness: Unwilling to quit at this time, but is working on cutting back. Assist with counseling and pharmacotherapy: Counseled for 5 minutes and literature provided in the office today  Arrange for follow up: follow up in 3 months and continue to offer help.      Morbid obesity ([place])    Wt Readings from Last 3 Encounters:  05/18/22 215 lb (97.5 kg)  04/17/22 249 lb (112.9 kg)  02/21/22 245 lb (111.1 kg)  Patient counseled on low-carb diet, encouraged to engage in regular vigorous exercises at least 150 minutes weekly      Cocaine abuse ([place])    Confirmed that she uses cocaine sometimes , patient counseled on the need to stop  taking cocaine including risk of overdose, heart disease, death.  She verbalized understanding      High risk sexual behavior    Patient requesting for STD testing today States that she uses condoms when having sex, has 2 sexual partners, currently has HIV infection Urine cytology obtained today. Patient counseled on the need to avoid high risk sexual behavior      Relevant Orders   Urine cytology ancillary only   Other Visit Diagnoses     Encounter for screening mammogram for malignant neoplasm of breast    -  Primary   Relevant Orders   US BREAST LTD UNI LEFT INC AXILLA   MM DIAG BREAST TOMO BILATERAL       Return in about 3 months (around 08/18/2022) for CPE/PAP.   Renee Rival, FNP

## 2022-05-18 NOTE — Assessment & Plan Note (Addendum)
Confirmed that she uses cocaine sometimes , patient counseled on the need to stop taking cocaine including risk of overdose, heart disease, death.  She verbalized understanding

## 2022-05-18 NOTE — Assessment & Plan Note (Signed)
Patient requesting for STD testing today States that she uses condoms when having sex, has 2 sexual partners, currently has HIV infection Urine cytology obtained today. Patient counseled on the need to avoid high risk sexual behavior

## 2022-05-18 NOTE — Assessment & Plan Note (Signed)
Wt Readings from Last 3 Encounters:  05/18/22 215 lb (97.5 kg)  04/17/22 249 lb (112.9 kg)  02/21/22 245 lb (111.1 kg)  Patient counseled on low-carb diet, encouraged to engage in regular vigorous exercises at least 150 minutes weekly

## 2022-05-18 NOTE — Progress Notes (Unsigned)
[place]anto

## 2022-05-18 NOTE — Assessment & Plan Note (Signed)
BP Readings from Last 3 Encounters:  05/18/22 134/87  04/17/22 (!) 148/92  02/21/22 (!) 177/102  Chronic condition well-controlled on losartan 50 mg daily, propanolol 10 mg 2 times daily DASH diet advised engage in regular vigorous exercise with at least 150 minutes weekly as tolerated Lab Results  Component Value Date   NA 139 01/24/2022   K 4.8 01/24/2022   CO2 30 01/24/2022   GLUCOSE 82 01/24/2022   BUN 18 01/24/2022   CREATININE 0.81 01/24/2022   CALCIUM 9.6 01/24/2022   EGFR 89 01/24/2022   GFRNONAA >60 11/10/2020

## 2022-05-18 NOTE — Assessment & Plan Note (Addendum)
Smokes about 3 pack/day  Asked about quitting: confirms that he/she currently smokes cigarettes Advise to quit smoking: Educated about QUITTING to reduce the risk of cancer, cardio and cerebrovascular disease. Assess willingness: Unwilling to quit at this time, but is working on cutting back. Assist with counseling and pharmacotherapy: Counseled for 5 minutes and literature provided in the office today  Arrange for follow up: follow up in 3 months and continue to offer help.

## 2022-05-18 NOTE — Assessment & Plan Note (Signed)
Albuterol 108 mcg/act inhaler 2 puffs every 4 hours as needed refilled.

## 2022-05-18 NOTE — Patient Instructions (Addendum)
STD testing today.    It is important that you exercise regularly at least 30 minutes 5 times a week.  Think about what you will eat, plan ahead. Choose " clean, green, fresh or frozen" over canned, processed or packaged foods which are more sugary, salty and fatty. 70 to 75% of food eaten should be vegetables and fruit. Three meals at set times with snacks allowed between meals, but they must be fruit or vegetables. Aim to eat over a 12 hour period , example 7 am to 7 pm, and STOP after  your last meal of the day. Drink water,generally about 64 ounces per day, no other drink is as healthy. Fruit juice is best enjoyed in a healthy way, by EATING the fruit.  Thanks for choosing [facility], we consider it a privelige to serve you.

## 2022-05-19 ENCOUNTER — Other Ambulatory Visit: Payer: Self-pay | Admitting: Nurse Practitioner

## 2022-05-19 ENCOUNTER — Other Ambulatory Visit (HOSPITAL_COMMUNITY): Payer: Self-pay

## 2022-05-19 ENCOUNTER — Telehealth: Payer: Self-pay

## 2022-05-19 DIAGNOSIS — A599 Trichomoniasis, unspecified: Secondary | ICD-10-CM

## 2022-05-19 LAB — URINE CYTOLOGY ANCILLARY ONLY
Candida Urine: NEGATIVE
Chlamydia: NEGATIVE
Comment: NEGATIVE
Comment: NEGATIVE
Comment: NORMAL
Neisseria Gonorrhea: NEGATIVE
Trichomonas: POSITIVE — AB

## 2022-05-19 MED ORDER — METRONIDAZOLE 500 MG PO TABS
500.0000 mg | ORAL_TABLET | Freq: Two times a day (BID) | ORAL | 0 refills | Status: AC
  Filled 2022-05-19: qty 14, 7d supply, fill #0

## 2022-05-19 NOTE — Chronic Care Management (AMB) (Unsigned)
Care Management   Outreach Note  05/19/2022 Name: Rachel Underwood MRN: [id number] DOB: [birth year]  Referred by: Donell Beers, FNP Reason for referral : Care Coordination (Outreach to  schedule referral with LCSW)   An unsuccessful telephone outreach was attempted today. The patient was referred to the case management team for assistance with care management and care coordination.   Follow Up Plan:  A HIPAA compliant phone message was left for the patient providing contact information and requesting a return call.  The care management team will reach out to the patient again over the next 3 days.  If patient returns call to provider office, please advise to call Embedded Care Management Care Guide Penne Lash* at [phone]* Penne Lash, RMA Care Guide, Embedded Care Coordination [facility]  [place], [place] [address] Direct Dial: [phone] [email or web address]: [email or web address]

## 2022-05-19 NOTE — Progress Notes (Signed)
Patient tested positive for trichomonas, take Flagyl 500 mg twice daily for 7 days patient should avoid alcohol while taking this medication.  Patient should abstain from sex until she has completed her dose of antibiotics, she should encourage her partners to also get tested.

## 2022-05-20 ENCOUNTER — Other Ambulatory Visit (HOSPITAL_COMMUNITY): Payer: Self-pay

## 2022-05-22 ENCOUNTER — Other Ambulatory Visit (HOSPITAL_COMMUNITY): Payer: Self-pay

## 2022-05-22 NOTE — Chronic Care Management (AMB) (Signed)
Care Management   Note  05/22/2022 Name: Dawne Sangster MRN: [id number] DOB: Aug 25, 1971  Tabby Shackelton is a 51 y.o. year old female who is a primary care patient of Donell Beers, FNP. I reached out to Wynnie Stickels by phone today offer care coordination services.   Ms. Cator was given information about care management services today including:  Care management services include personalized support from designated clinical staff supervised by her physician, including individualized plan of care and coordination with other care providers 24/7 contact phone numbers for assistance for urgent and routine care needs. The patient may stop care management services at any time by phone call to the office staff.  Patient agreed to services and verbal consent obtained.   Follow up plan: Telephone appointment with care management team member scheduled for:05/25/2022  Penne Lash, RMA Care Guide, Embedded Care Coordination [facility]  [place], [place] [address] Direct Dial: [phone] [email or web address]: [email or web address]

## 2022-05-23 ENCOUNTER — Other Ambulatory Visit: Payer: Self-pay | Admitting: Nurse Practitioner

## 2022-05-23 ENCOUNTER — Other Ambulatory Visit (HOSPITAL_COMMUNITY): Payer: Self-pay

## 2022-05-24 ENCOUNTER — Other Ambulatory Visit: Payer: Self-pay

## 2022-05-24 ENCOUNTER — Other Ambulatory Visit (HOSPITAL_COMMUNITY): Payer: Self-pay

## 2022-05-24 MED ORDER — LEVOCETIRIZINE DIHYDROCHLORIDE 5 MG PO TABS
5.0000 mg | ORAL_TABLET | Freq: Every evening | ORAL | 0 refills | Status: DC
  Filled 2022-05-24 – 2022-05-25 (×2): qty 90, 90d supply, fill #0

## 2022-05-25 ENCOUNTER — Telehealth: Payer: Self-pay | Admitting: Licensed Clinical Social Worker

## 2022-05-25 ENCOUNTER — Telehealth: Payer: Medicaid Other | Admitting: Licensed Clinical Social Worker

## 2022-05-25 ENCOUNTER — Other Ambulatory Visit (HOSPITAL_COMMUNITY): Payer: Self-pay

## 2022-05-25 NOTE — Telephone Encounter (Signed)
Care Management    Clinical Social Work Note   05/25/2022 Name: Rachel Underwood        MRN: [id number]       DOB: Apr 08, 1971   Rachel Underwood is a 51 y.o. year old female who is a primary care patient of Donell Beers, FNP. I reached out to Lanika Viloria by phone today to  offer care coordination services.   Unsuccessful client outreach. LCSW called client phone number today for scheduled phone visit but LCSW was not able to speak with [name] via phone today. LCSW did leave phone message for [name] asking her to call LCSW at [phone].  Follow Up Plan:  LCSW to ask Care Guide scheduler to re-schedule LCSW phone visit with client  Kelton Pillar.Shevaun Lovan MSW, LCSW Licensed Visual merchandiser [facility] Care Management [phone]

## 2022-05-26 ENCOUNTER — Other Ambulatory Visit (HOSPITAL_COMMUNITY): Payer: Self-pay

## 2022-05-29 ENCOUNTER — Other Ambulatory Visit: Payer: Self-pay | Admitting: Nurse Practitioner

## 2022-05-29 DIAGNOSIS — N6320 Unspecified lump in the left breast, unspecified quadrant: Secondary | ICD-10-CM

## 2022-06-01 ENCOUNTER — Ambulatory Visit: Payer: Medicaid Other | Admitting: Licensed Clinical Social Worker

## 2022-06-01 DIAGNOSIS — J301 Allergic rhinitis due to pollen: Secondary | ICD-10-CM

## 2022-06-01 DIAGNOSIS — F3170 Bipolar disorder, currently in remission, most recent episode unspecified: Secondary | ICD-10-CM

## 2022-06-01 DIAGNOSIS — K2100 Gastro-esophageal reflux disease with esophagitis, without bleeding: Secondary | ICD-10-CM

## 2022-06-01 DIAGNOSIS — Z7251 High risk heterosexual behavior: Secondary | ICD-10-CM

## 2022-06-01 DIAGNOSIS — F209 Schizophrenia, unspecified: Secondary | ICD-10-CM

## 2022-06-01 DIAGNOSIS — F1410 Cocaine abuse, uncomplicated: Secondary | ICD-10-CM

## 2022-06-01 DIAGNOSIS — I10 Essential (primary) hypertension: Secondary | ICD-10-CM

## 2022-06-01 DIAGNOSIS — I471 Supraventricular tachycardia: Secondary | ICD-10-CM

## 2022-06-01 NOTE — Patient Instructions (Signed)
Visit Information  Thank you for taking time to visit with me today. Please don't hesitate to contact me if I can be of assistance to you before our next scheduled telephone appointment.  Following are the goals we discussed today:  (Copy and paste patient goals from clinical care plan here)  Our next appointment is by telephone : to be set by [place] in next 2 weeks   Please call the care guide team at [phone] if you need to cancel or reschedule your appointment.   If you are experiencing a Mental Health or [place] or need someone to talk to, please call the [facility]: [phone]   Following is a copy of your full plan of care:  Care Plan : Rachel Underwood  Updates made by Katha Cabal, LCSW since 06/01/2022 12:00 AM     Problem: Emotional Distress      Goal: Emotional Health Supported.  Manage anxiety issues. Manage Depression issues. Locate suitable housing location   Start Date: 06/01/2022  Expected End Date: 08/28/2022  This Visit's Progress: Not on track  Priority: High  Note:   Current Barriers:  Housing needs Financial Needs Stress issues Suicidal Ideation/Homicidal Ideation: No  Clinical Social Work Goal(s):  patient will work with SW monthly by telephone or in person to reduce or manage symptoms related to anixety issues or depression issues Patient will communicate with SW monthly by phone or in person to discuss housing needs of client Cleint will attend scheduled medical appointments in next 30 days Client will contact RNCM as needed in next 30 days for nursing support  Interventions: Patient interviewed and appropriate assessments performed: PHQ 2/9; GAD-7 1:1 collaboration with Renee Rival, FNP regarding development and update of comprehensive plan of care as evidenced by provider attestation and co-signature Discussed client needs with Denajah Satter Discussed medication procurement of  client Discussed transport needs. Discussed client use of [employer] for transport assistance Discussed relaxtion techniques; client uses deep breathing techniques; client uses creative imagery to help her relax; client likes to listen to music, client likes to read Regarding counseling , client said it has been a while since she talked with counselor . Discussed cocaine use She said she used cocaine occasionally Discussed housing needs. She has been searching in [place], [place] for housing location.  Reviewed vision. She said she has blurry vision Reviewed ambulation needs. She said she uses a cane to help her walk Client reported she is almost out of Lamictal and needs a refill on Lamictal She said Lamictal help her with mood swing issues Reviewed family support. She has reduced family support. She has support of friends Reviewed sleeping issues. She said she has difficulty sleeping Encouraged client to call RNCM in next 30 days for nursing support   Patient Coping Skills: Takes medications as prescribed Attends scheduled medical appointments  Patient Deficits:  Financial stress Housing needs Transport needs Anxiety issues  Patient Goals:  - spend time or talk with others at least 2 to 3 times per week - practice relaxation or meditation daily - keep a calendar with appointment dates  Follow Up Plan: Makena to call client to schedule phone visit of LCSW with client in next 2 weeks    Ms. Kercheval was given information about Care Management services by the embedded care coordination team including:  Care Management services include personalized support from designated clinical staff supervised by her physician, including individualized plan of care  and coordination with other care providers 24/7 contact phone numbers for assistance for urgent and routine care needs. The patient may stop CCM services at any time (effective at the end of the month) by phone call to the  office staff.  Patient agreed to services and verbal consent obtained.   Norva Riffle.Kala Ambriz MSW, [place] [facility] representative [facility] Care Management [phone]

## 2022-06-01 NOTE — Chronic Care Management (AMB) (Signed)
Care Management Clinical Social Work Note  06/01/2022 Name: Rachel Underwood MRN: [id number] DOB: 1971/04/17  Rachel Underwood is a 51 y.o. year old female who is a primary care patient of Rachel Beers, FNP.  The Care Management team was consulted for assistance with chronic disease management and coordination needs.  Engaged with patient by telephone for initial visit in response to provider referral for social work chronic care management and care coordination services  Consent to Services:  Rachel Underwood was given information about Care Management services today including:  Care Management services includes personalized support from designated clinical staff supervised by her physician, including individualized plan of care and coordination with other care providers 24/7 contact phone numbers for assistance for urgent and routine care needs. The patient may stop case management services at any time by phone call to the office staff.  Patient agreed to services and consent obtained.   Assessment: Review of patient past medical history, allergies, medications, and health status, including review of relevant consultants reports was performed today as part of a comprehensive evaluation and provision of chronic care management and care coordination services.  SDOH (Social Determinants of Health) assessments and interventions performed:  SDOH Interventions    Flowsheet Row Most Recent Value  SDOH Interventions   Physical Activity Interventions Other (Comments)  [walking challenges,  uses a cane to help her walk]  Stress Interventions Provide Counseling  [client has stress related to housing needs and medical needs]  Depression Interventions/Treatment  Counseling, Medication        Advanced Directives Status: See Vynca application for related entries.  Care Plan  Allergies  Allergen Reactions   Flecainide Swelling   Piroxicam Anaphylaxis   Abilify [Aripiprazole]    Citalopram Hives    Codeine Nausea And Vomiting   Depakote [Divalproex Sodium] Swelling   Gabapentin     Hives    Haldol [Haloperidol Lactate] Other (See Comments)    Slurs speech and "stumble"   Haloperidol    Indomethacin Swelling    Dizzy, tigling of face   Invega [Paliperidone] Other (See Comments)    Slurred speech, drooling   Latuda [Lurasidone Hcl] Itching   Lithium Hives   Lurasidone    Promethazine Swelling    Arms and hands   Trazodone And Nefazodone Other (See Comments)    Dry mouth, congestion, severe dryness.   Lexapro [Escitalopram Oxalate] Rash    Outpatient Encounter Medications as of 06/01/2022  Medication Sig   acyclovir (ZOVIRAX) 400 MG tablet TAKE 1 TABLET BY MOUTH 2 TIMES DAILY AS NEEDED FOR PROPHYLAXIS   albuterol (VENTOLIN HFA) 108 (90 Base) MCG/ACT inhaler INHALE 2 PUFFS INTO THE LUNGS EVERY 4 HOURS AS NEEDED FOR WHEEZING OR SHORTNESS OF BREATH.   azelastine (ASTELIN) 0.1 % nasal spray Place 1 spray into both nostrils 2 (two) times daily. (Patient not taking: Reported on 03/08/2021)   bictegravir-emtricitabine-tenofovir AF (BIKTARVY) 50-200-25 MG TABS tablet Take 1 tablet by mouth daily.   bictegravir-emtricitabine-tenofovir AF (BIKTARVY) 50-200-25 MG TABS tablet TAKE 1 TABLET BY MOUTH DAILY.   cromolyn (OPTICROM) 4 % ophthalmic solution Place 1 drop into both eyes daily.   cycloSPORINE (RESTASIS) 0.05 % ophthalmic emulsion Place 1 drop into both eyes 2 (two) times daily.   EPINEPHrine 0.3 mg/0.3 mL IJ SOAJ injection Inject 0.3 mg into the skin daily as needed for anaphylaxis.   hydrocortisone 2.5 % lotion Apply topically.   ibuprofen (ADVIL) 800 MG tablet Take 1 tablet by mouth every 8 (eight) hours as  needed.   lamoTRIgine (LAMICTAL) 200 MG tablet TAKE 1 TABLET BY MOUTH ONCE A DAY   levocetirizine (XYZAL) 5 MG tablet Take 1 tablet (5 mg total) by mouth every evening.   linaclotide (LINZESS) 290 MCG CAPS capsule Take 1 capsule by mouth daily on empty stomach   losartan (COZAAR)  50 MG tablet Take 1 tablet (50 mg total) by mouth daily.   nystatin (MYCOSTATIN/NYSTOP) powder Apply topically 3 times daily for up to 2 weeks for rash under breast.   ondansetron (ZOFRAN-ODT) 4 MG disintegrating tablet Take 4 mg by mouth 3 (three) times daily as needed. (Patient not taking: Reported on 05/18/2022)   pantoprazole (PROTONIX) 40 MG tablet Take 1 tablet (40 mg total) by mouth daily.   propranolol (INDERAL) 10 MG tablet Take 1 tablet by mouth 2 times daily.   QUEtiapine (SEROQUEL) 200 MG tablet Take 2 tablets by mouth at bedtime   sertraline (ZOLOFT) 100 MG tablet TAKE 2 TABLETS BY MOUTH ONCE DAILY   Study - REPRIEVE [id number] - pitavastatin 4 mg or placebo tablet ([place]) Take 1 tablet by mouth daily.  [id number] I [id number] E ACTG [id number]   No facility-administered encounter medications on file as of 06/01/2022.    Patient Active Problem List   Diagnosis Date Noted   PSVT (paroxysmal supraventricular tachycardia) (HCC) 05/18/2022   Morbid obesity (HCC) 05/18/2022   Cocaine abuse (HCC) 05/18/2022   High risk sexual behavior 05/18/2022   Asthma 05/18/2022   Plantar wart 02/01/2021   Tobacco abuse disorder 02/01/2021   Positive ANA (antinuclear antibody) 01/28/2021   Arthritis 01/28/2021   Rash 01/28/2021   Fibromyalgia syndrome 01/28/2021   Bilateral carpal tunnel syndrome 02/17/2016   Allergic rhinitis due to pollen 02/17/2016   Headache 11/09/2015   Chronic back pain 11/09/2015   Diarrhea 05/19/2015   Cysts of both ovaries 05/12/2015   Abdominal wall hernia 02/03/2015   Abdominal wall mass of periumbilical region 02/03/2015   Schatzki's ring    Diverticulosis of colon without hemorrhage    Other hemorrhoids    Rectal bleeding 12/11/2014   GERD (gastroesophageal reflux disease) 12/11/2014   Esophageal dysphagia 12/11/2014   Constipation 12/11/2014   S/P hysterectomy 09/01/2014   Pelvic pain in female 07/22/2014   Anovulation 06/18/2014   DUB (dysfunctional uterine  bleeding) 06/18/2014   Menorrhagia with irregular cycle 06/03/2014   Enlarged uterus 06/03/2014   HTN (hypertension) 01/15/2014   Human immunodeficiency virus (HIV) disease (HCC) 07/17/2013   Schizophrenia (HCC) 07/17/2013   Bipolar disorder, unspecified (HCC) 07/17/2013    Conditions to be addressed/monitored: monitor housing needs of client; monitor client management of anxiety issues  Care Plan : LCSW Care Plan  Updates made by Isaiah Blakes, LCSW since 06/01/2022 12:00 AM     Problem: Emotional Distress      Goal: Emotional Health Supported.  Manage anxiety issues. Manage Depression issues. Locate suitable housing location   Start Date: 06/01/2022  Expected End Date: 08/28/2022  This Visit's Progress: Not on track  Priority: High  Note:   Current Barriers:  Housing needs Financial Needs Stress issues Suicidal Ideation/Homicidal Ideation: No  Clinical Social Work Goal(s):  patient will work with SW monthly by telephone or in person to reduce or manage symptoms related to anixety issues or depression issues Patient will communicate with SW monthly by phone or in person to discuss housing needs of client Cleint will attend scheduled medical appointments in next 30 days Client will contact RNCM as needed in next 30 days  for nursing support  Interventions: Patient interviewed and appropriate assessments performed: PHQ 2/9; GAD-7 1:1 collaboration with Rachel Beers, FNP regarding development and update of comprehensive plan of care as evidenced by provider attestation and co-signature Discussed client needs with Jaymarie Levitan Discussed medication procurement of client Discussed transport needs. Discussed client use of [employer] for transport assistance Discussed relaxtion techniques; client uses deep breathing techniques; client uses creative imagery to help her relax; client likes to listen to music, client likes to read Regarding counseling , client said it has been a  while since she talked with counselor . Discussed cocaine use She said she used cocaine occasionally Discussed housing needs. She has been searching in [place], [place] for housing location.  Reviewed vision. She said she has blurry vision Reviewed ambulation needs. She said she uses a cane to help her walk Client reported she is almost out of Lamictal and needs a refill on Lamictal She said Lamictal help her with mood swing issues Reviewed family support. She has reduced family support. She has support of friends Reviewed sleeping issues. She said she has difficulty sleeping Encouraged client to call RNCM in next 30 days for nursing support   Patient Coping Skills: Takes medications as prescribed Attends scheduled medical appointments  Patient Deficits:  Financial stress Housing needs Transport needs Anxiety issues  Patient Goals:  - spend time or talk with others at least 2 to 3 times per week - practice relaxation or meditation daily - keep a calendar with appointment dates  Follow Up Plan: Care Guide Scheduler to call client to schedule phone visit of LCSW with client in next 2 weeks     Kelton Pillar.Jarom Govan MSW, LCSW Licensed Visual merchandiser [facility] Care Management [phone]

## 2022-06-05 ENCOUNTER — Other Ambulatory Visit (HOSPITAL_COMMUNITY): Payer: Self-pay

## 2022-06-08 ENCOUNTER — Ambulatory Visit (HOSPITAL_COMMUNITY): Payer: Medicaid Other

## 2022-06-08 ENCOUNTER — Inpatient Hospital Stay (HOSPITAL_COMMUNITY): Admission: RE | Admit: 2022-06-08 | Payer: Medicaid Other | Source: Ambulatory Visit

## 2022-06-09 ENCOUNTER — Telehealth: Payer: Self-pay

## 2022-06-09 ENCOUNTER — Other Ambulatory Visit: Payer: Self-pay | Admitting: Internal Medicine

## 2022-06-09 MED ORDER — FLUCONAZOLE 200 MG PO TABS
200.0000 mg | ORAL_TABLET | Freq: Every day | ORAL | 1 refills | Status: DC
  Filled 2022-06-13: qty 7, 7d supply, fill #0
  Filled 2022-07-18: qty 7, 7d supply, fill #1

## 2022-06-09 NOTE — Telephone Encounter (Signed)
Patient called, states she believes she has thrush and would like a prescription sent to the [facility] in [place]. Reports symptoms have been present for about 5 days.    Beryle Flock, RN

## 2022-06-13 ENCOUNTER — Ambulatory Visit (INDEPENDENT_AMBULATORY_CARE_PROVIDER_SITE_OTHER): Payer: Medicaid Other | Admitting: Pharmacist

## 2022-06-13 ENCOUNTER — Other Ambulatory Visit: Payer: Self-pay | Admitting: Internal Medicine

## 2022-06-13 ENCOUNTER — Encounter: Payer: Self-pay | Admitting: Internal Medicine

## 2022-06-13 ENCOUNTER — Ambulatory Visit (INDEPENDENT_AMBULATORY_CARE_PROVIDER_SITE_OTHER): Payer: Medicaid Other | Admitting: Internal Medicine

## 2022-06-13 ENCOUNTER — Other Ambulatory Visit (HOSPITAL_COMMUNITY): Payer: Self-pay

## 2022-06-13 ENCOUNTER — Other Ambulatory Visit: Payer: Self-pay

## 2022-06-13 VITALS — BP 161/106 | HR 81 | Temp 98.5°F | Wt 219.0 lb

## 2022-06-13 DIAGNOSIS — Z79899 Other long term (current) drug therapy: Secondary | ICD-10-CM

## 2022-06-13 DIAGNOSIS — R251 Tremor, unspecified: Secondary | ICD-10-CM | POA: Diagnosis not present

## 2022-06-13 DIAGNOSIS — Z95818 Presence of other cardiac implants and grafts: Secondary | ICD-10-CM | POA: Diagnosis not present

## 2022-06-13 DIAGNOSIS — K2100 Gastro-esophageal reflux disease with esophagitis, without bleeding: Secondary | ICD-10-CM

## 2022-06-13 DIAGNOSIS — B20 Human immunodeficiency virus [HIV] disease: Secondary | ICD-10-CM

## 2022-06-13 MED ORDER — CABOTEGRAVIR & RILPIVIRINE ER 600 & 900 MG/3ML IM SUER
1.0000 | INTRAMUSCULAR | 5 refills | Status: DC
  Filled 2022-06-13: qty 6, 60d supply, fill #0

## 2022-06-13 MED ORDER — PROPRANOLOL HCL 20 MG PO TABS
20.0000 mg | ORAL_TABLET | Freq: Three times a day (TID) | ORAL | 5 refills | Status: DC
  Filled 2022-06-13 – 2022-06-22 (×2): qty 90, 30d supply, fill #0
  Filled 2022-07-18 – 2022-08-16 (×2): qty 90, 30d supply, fill #1
  Filled 2022-09-14: qty 90, 30d supply, fill #2
  Filled 2022-10-11: qty 90, 30d supply, fill #3
  Filled 2022-10-23: qty 90, 30d supply, fill #4
  Filled 2022-11-15: qty 90, 30d supply, fill #5

## 2022-06-13 MED ORDER — CABOTEGRAVIR & RILPIVIRINE ER 600 & 900 MG/3ML IM SUER
1.0000 | INTRAMUSCULAR | 1 refills | Status: DC
  Filled 2022-06-13 (×2): qty 6, 30d supply, fill #0
  Filled 2022-07-10: qty 6, 30d supply, fill #1

## 2022-06-13 MED ORDER — PANTOPRAZOLE SODIUM 40 MG PO TBEC
40.0000 mg | DELAYED_RELEASE_TABLET | Freq: Every day | ORAL | 3 refills | Status: DC
  Filled 2022-06-13 – 2022-06-28 (×3): qty 30, 30d supply, fill #0
  Filled 2022-07-18: qty 30, 30d supply, fill #1
  Filled 2022-08-16: qty 30, 30d supply, fill #2
  Filled 2022-10-23: qty 30, 30d supply, fill #3

## 2022-06-13 MED ORDER — CABOTEGRAVIR & RILPIVIRINE ER 600 & 900 MG/3ML IM SUER
1.0000 | Freq: Once | INTRAMUSCULAR | Status: AC
  Administered 2022-06-13: 1 via INTRAMUSCULAR

## 2022-06-13 MED ORDER — BICTEGRAVIR-EMTRICITAB-TENOFOV 50-200-25 MG PO TABS
1.0000 | ORAL_TABLET | Freq: Every day | ORAL | 6 refills | Status: DC
  Filled 2022-06-13: qty 30, 30d supply, fill #0

## 2022-06-13 MED ORDER — ONDANSETRON 4 MG PO TBDP
4.0000 mg | ORAL_TABLET | Freq: Three times a day (TID) | ORAL | 3 refills | Status: DC | PRN
  Filled 2022-06-13 – 2022-06-28 (×2): qty 20, 7d supply, fill #0
  Filled 2022-07-18: qty 20, 7d supply, fill #1
  Filled 2022-08-16: qty 20, 7d supply, fill #2
  Filled 2022-09-14: qty 20, 7d supply, fill #3

## 2022-06-13 NOTE — Progress Notes (Signed)
Patient ID: Rachel Underwood, female   DOB: [birth year], 51 y.o.   MRN: [id number]  HPI 51yo F with hiv disease, currently on biktarvy  Didn't go see neurology - for resting tremor; remote history of seizure and migraines - previously was on topimax '50mg'$  bid. (Was in 2018)  Also would like to get referred to dermatology for overall skin evaluatoin. Has rough eczematic patch on forehead. 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  Was followed at [facility] ID clinic up until march 2023.  Outpatient Encounter Medications as of 06/13/2022  Medication Sig   acyclovir (ZOVIRAX) 400 MG tablet TAKE 1 TABLET BY MOUTH 2 TIMES DAILY AS NEEDED FOR PROPHYLAXIS   albuterol (VENTOLIN HFA) 108 (90 Base) MCG/ACT inhaler INHALE 2 PUFFS INTO THE LUNGS EVERY 4 HOURS AS NEEDED FOR WHEEZING OR SHORTNESS OF BREATH.   bictegravir-emtricitabine-tenofovir AF (BIKTARVY) 50-200-25 MG TABS tablet TAKE 1 TABLET BY MOUTH DAILY.   cromolyn (OPTICROM) 4 % ophthalmic solution Place 1 drop into both eyes daily.   cycloSPORINE (RESTASIS) 0.05 % ophthalmic emulsion Place 1 drop into both eyes 2 (two) times daily.   fluconazole (DIFLUCAN) 200 MG tablet Take 1 tablet (200 mg total) by mouth daily.   hydrocortisone 2.5 % lotion Apply topically.   ibuprofen (ADVIL) 800 MG tablet Take 1 tablet by mouth every 8 (eight) hours as needed.   lamoTRIgine (LAMICTAL) 200 MG tablet TAKE 1 TABLET BY MOUTH ONCE A DAY   levocetirizine (XYZAL) 5 MG tablet Take 1 tablet (5 mg total) by mouth every evening.   linaclotide (LINZESS) 290 MCG CAPS capsule Take 1 capsule by mouth daily on empty stomach   losartan (COZAAR) 50 MG tablet Take 1 tablet (50 mg total) by mouth daily.   nystatin (MYCOSTATIN/NYSTOP) powder Apply topically 3 times daily for up to 2 weeks for rash under breast.   propranolol (INDERAL) 10 MG tablet Take  1 tablet by mouth 2 times daily.   QUEtiapine (SEROQUEL) 200 MG tablet Take 2 tablets by mouth at bedtime   sertraline (ZOLOFT) 100 MG tablet TAKE 2 TABLETS BY MOUTH ONCE DAILY   Study - REPRIEVE [id number] - pitavastatin 4 mg or placebo tablet (PI-[place]) Take 1 tablet by mouth daily.  [id number] I [id number] E ACTG [id number]   azelastine (ASTELIN) 0.1 % nasal spray Place 1 spray into both nostrils 2 (two) times daily. (Patient not taking: Reported on 06/13/2022)   bictegravir-emtricitabine-tenofovir AF (BIKTARVY) 50-200-25 MG TABS tablet Take 1 tablet by mouth daily. (Patient not taking: Reported on 06/13/2022)   EPINEPHrine 0.3 mg/0.3 mL IJ SOAJ injection Inject 0.3 mg into the skin daily as needed for anaphylaxis. (Patient not taking: Reported on 06/13/2022)   ondansetron (ZOFRAN-ODT) 4 MG disintegrating tablet Take 4 mg by mouth 3 (three) times daily as needed. (Patient not taking: Reported on 05/18/2022)   pantoprazole (PROTONIX) 40 MG tablet Take 1 tablet (40 mg total) by mouth daily. (Patient not taking: Reported on 06/13/2022)   No facility-administered encounter medications on file as of 06/13/2022.     Patient Active Problem List   Diagnosis Date Noted   PSVT (paroxysmal supraventricular tachycardia) ([place]) 05/18/2022   Morbid obesity ([place]) 05/18/2022   Cocaine abuse ([place]) 05/18/2022   High risk sexual behavior 05/18/2022   Asthma 05/18/2022   Plantar wart 02/01/2021   Tobacco abuse disorder 02/01/2021  Positive ANA (antinuclear antibody) 01/28/2021   Arthritis 01/28/2021   Rash 01/28/2021   Fibromyalgia syndrome 01/28/2021   Bilateral carpal tunnel syndrome 02/17/2016   Allergic rhinitis due to pollen 02/17/2016   Headache 11/09/2015   Chronic back pain 11/09/2015   Diarrhea 05/19/2015   Cysts of both ovaries 05/12/2015   Abdominal wall hernia 02/03/2015   Abdominal wall mass of periumbilical region 16/12/930   Schatzki's ring    Diverticulosis of colon without hemorrhage    Other  hemorrhoids    Rectal bleeding 12/11/2014   GERD (gastroesophageal reflux disease) 12/11/2014   Esophageal dysphagia 12/11/2014   Constipation 12/11/2014   S/P hysterectomy 09/01/2014   Pelvic pain in female 07/22/2014   Anovulation 06/18/2014   DUB (dysfunctional uterine bleeding) 06/18/2014   Menorrhagia with irregular cycle 06/03/2014   Enlarged uterus 06/03/2014   HTN (hypertension) 01/15/2014   Human immunodeficiency virus (HIV) disease ([place]) 07/17/2013   Schizophrenia ([place]) 07/17/2013   Bipolar disorder, unspecified ([place]) 07/17/2013     Health Maintenance Due  Topic Date Due   MAMMOGRAM  01/27/2017   PAP SMEAR-Modifier  10/12/2021     Review of Systems 12 point ros is negative except what is mentioned above Physical Exam   BP (!) 161/106   Pulse 81   Temp 98.5 F (36.9 C) (Oral)   Wt 219 lb (99.3 kg)   LMP 08/11/2014   BMI 36.44 kg/m   Physical Exam  Constitutional:  oriented to person, place, and time. appears well-developed and well-nourished. No distress.  HENT: [place]/AT, PERRLA, no scleral icterus Mouth/Throat: Oropharynx is clear and moist. No oropharyngeal exudate.  Cardiovascular: Normal rate, regular rhythm and normal heart sounds. Exam reveals no gallop and no friction rub.  No murmur heard.  Pulmonary/Chest: Effort normal and breath sounds normal. No respiratory distress.  has no wheezes.  Neck = supple, no nuchal rigidity Abdominal: Soft. Bowel sounds are normal.  exhibits no distension. There is no tenderness.  Lymphadenopathy: no cervical adenopathy. No axillary adenopathy Neurological: alert and oriented to person, place, and time.  Skin: Skin is warm and dry. No rash noted. No erythema.  Psychiatric: a normal mood and affect.  behavior is normal.   Lab Results  Component Value Date   CD4TCELL 32 01/24/2022   Lab Results  Component Value Date   CD4TABS 801 09/23/2020   CD4TABS 636 03/17/2020   CD4TABS 331 (L) 05/26/2019   Lab Results   Component Value Date   HIV1RNAQUANT Not Detected 01/24/2022   Lab Results  Component Value Date   HEPBSAB POS (A) 07/15/2013   Lab Results  Component Value Date   LABRPR NON-REACTIVE 01/24/2022    CBC Lab Results  Component Value Date   WBC 5.8 01/24/2022   RBC 4.41 01/24/2022   HGB 13.7 01/24/2022   HCT 40.9 01/24/2022   PLT 217 01/24/2022   MCV 92.7 01/24/2022   MCH 31.1 01/24/2022   MCHC 33.5 01/24/2022   RDW 12.4 01/24/2022   LYMPHSABS 2,691 01/24/2022   MONOABS 528 05/10/2016   EOSABS 87 01/24/2022    BMET Lab Results  Component Value Date   NA 139 01/24/2022   K 4.8 01/24/2022   CL 105 01/24/2022   CO2 30 01/24/2022   GLUCOSE 82 01/24/2022   BUN 18 01/24/2022   CREATININE 0.81 01/24/2022   CALCIUM 9.6 01/24/2022   GFRNONAA >60 11/10/2020   GFRAA 88 09/23/2020      Assessment and Plan Hiv disease = well controlled on  biktarvy. We will repeat labs today  Long term medication management = will check cr to ensure stable  Loop recorder removal = had it placed at a different facility many years ago. Will refer to cardiology to see if can arrange for removal Tremor= will increase dose of propranolol Dermatitis = will do derm referral

## 2022-06-13 NOTE — Progress Notes (Signed)
HPI: Rachel Underwood is a 51 y.o. female who presents to the [facility] for [place] administration.  Patient Active Problem List   Diagnosis Date Noted   PSVT (paroxysmal supraventricular tachycardia) ([place]) 05/18/2022   Morbid obesity ([place]) 05/18/2022   Cocaine abuse ([place]) 05/18/2022   High risk sexual behavior 05/18/2022   Asthma 05/18/2022   Plantar wart 02/01/2021   Tobacco abuse disorder 02/01/2021   Positive ANA (antinuclear antibody) 01/28/2021   Arthritis 01/28/2021   Rash 01/28/2021   Fibromyalgia syndrome 01/28/2021   Bilateral carpal tunnel syndrome 02/17/2016   Allergic rhinitis due to pollen 02/17/2016   Headache 11/09/2015   Chronic back pain 11/09/2015   Diarrhea 05/19/2015   Cysts of both ovaries 05/12/2015   Abdominal wall hernia 02/03/2015   Abdominal wall mass of periumbilical region 97/35/3299   Schatzki's ring    Diverticulosis of colon without hemorrhage    Other hemorrhoids    Rectal bleeding 12/11/2014   GERD (gastroesophageal reflux disease) 12/11/2014   Esophageal dysphagia 12/11/2014   Constipation 12/11/2014   S/P hysterectomy 09/01/2014   Pelvic pain in female 07/22/2014   Anovulation 06/18/2014   DUB (dysfunctional uterine bleeding) 06/18/2014   Menorrhagia with irregular cycle 06/03/2014   Enlarged uterus 06/03/2014   HTN (hypertension) 01/15/2014   Human immunodeficiency virus (HIV) disease ([place]) 07/17/2013   Schizophrenia ([place]) 07/17/2013   Bipolar disorder, unspecified ([place]) 07/17/2013    Patient's Medications  New Prescriptions   No medications on file  Previous Medications   ACYCLOVIR (ZOVIRAX) 400 MG TABLET    TAKE 1 TABLET BY MOUTH 2 TIMES DAILY AS NEEDED FOR PROPHYLAXIS   ALBUTEROL (VENTOLIN HFA) 108 (90 BASE) MCG/ACT INHALER    INHALE 2 PUFFS INTO THE LUNGS EVERY 4 HOURS AS NEEDED FOR WHEEZING OR SHORTNESS OF BREATH.   AZELASTINE (ASTELIN) 0.1 % NASAL SPRAY    Place 1 spray into both nostrils 2 (two) times daily.    BICTEGRAVIR-EMTRICITABINE-TENOFOVIR AF (BIKTARVY) 50-200-25 MG TABS TABLET    TAKE 1 TABLET BY MOUTH DAILY.   CROMOLYN (OPTICROM) 4 % OPHTHALMIC SOLUTION    Place 1 drop into both eyes daily.   CYCLOSPORINE (RESTASIS) 0.05 % OPHTHALMIC EMULSION    Place 1 drop into both eyes 2 (two) times daily.   EPINEPHRINE 0.3 MG/0.3 ML IJ SOAJ INJECTION    Inject 0.3 mg into the skin daily as needed for anaphylaxis.   FLUCONAZOLE (DIFLUCAN) 200 MG TABLET    Take 1 tablet (200 mg total) by mouth daily.   HYDROCORTISONE 2.5 % LOTION    Apply topically.   IBUPROFEN (ADVIL) 800 MG TABLET    Take 1 tablet by mouth every 8 (eight) hours as needed.   LAMOTRIGINE (LAMICTAL) 200 MG TABLET    TAKE 1 TABLET BY MOUTH ONCE A DAY   LEVOCETIRIZINE (XYZAL) 5 MG TABLET    Take 1 tablet (5 mg total) by mouth every evening.   LINACLOTIDE (LINZESS) 290 MCG CAPS CAPSULE    Take 1 capsule by mouth daily on empty stomach   LOSARTAN (COZAAR) 50 MG TABLET    Take 1 tablet (50 mg total) by mouth daily.   NYSTATIN (MYCOSTATIN/NYSTOP) POWDER    Apply topically 3 times daily for up to 2 weeks for rash under breast.   ONDANSETRON (ZOFRAN-ODT) 4 MG DISINTEGRATING TABLET    Take 1 tablet (4 mg total) by mouth 3 (three) times daily as needed.   PANTOPRAZOLE (PROTONIX) 40 MG TABLET    Take 1  tablet (40 mg total) by mouth daily.   PROPRANOLOL (INDERAL) 20 MG TABLET    Take 1 tablet (20 mg total) by mouth 3 (three) times daily.   QUETIAPINE (SEROQUEL) 200 MG TABLET    Take 2 tablets by mouth at bedtime   SERTRALINE (ZOLOFT) 100 MG TABLET    TAKE 2 TABLETS BY MOUTH ONCE DAILY   STUDY - REPRIEVE A5332 - PITAVASTATIN 4 MG OR [place] TABLET (PI-[place] DAM)    Take 1 tablet by mouth daily.  [id number] I [id number] E ACTG [id number]  Modified Medications   No medications on file  Discontinued Medications   No medications on file    Allergies: Allergies  Allergen Reactions   Flecainide Swelling   Piroxicam Anaphylaxis   Abilify [Aripiprazole]     Citalopram Hives   Codeine Nausea And Vomiting   Depakote [Divalproex Sodium] Swelling   Gabapentin     Hives    Haldol [Haloperidol Lactate] Other (See Comments)    Slurs speech and "stumble"   Haloperidol    Indomethacin Swelling    Dizzy, tigling of face   Invega [Paliperidone] Other (See Comments)    Slurred speech, drooling   Latuda [Lurasidone Hcl] Itching   Lithium Hives   Lurasidone    Promethazine Swelling    Arms and hands   Trazodone And Nefazodone Other (See Comments)    Dry mouth, congestion, severe dryness.   Lexapro [Escitalopram Oxalate] Rash    Past Medical History: Past Medical History:  Diagnosis Date   Abnormal Pap smear of cervix 2005(EST)   ckc for abnl pap- [facility] [place]   Allergy    Asthma    Bipolar 1 disorder ([place])    Borderline personality disorder ([place])    BPD (bronchopulmonary dysplasia)    Carpal tunnel syndrome    bilateral wrist   Depression    Dysmenorrhea 06/03/2014   Enlarged uterus 06/03/2014   Fibromyalgia    GERD (gastroesophageal reflux disease)    Herpes simplex without mention of complication    HIV (human immunodeficiency virus infection) ([place])    Hyperlipidemia    Hypertension    Menorrhagia with irregular cycle 06/03/2014   Osteoarthritis    Seizures ([place])    had as a teenager, unknown etiology and no meds.    Social History: Social History   Socioeconomic History   Marital status: Divorced    Spouse name: Not on file   Number of children: 3   Years of education: Not on file   Highest education level: Not on file  Occupational History   Not on file  Tobacco Use   Smoking status: Every Day    Packs/day: 0.30    Years: 30.00    Total pack years: 9.00    Types: Cigarettes    Start date: 12/04/1982   Smokeless tobacco: Never   Tobacco comments:    States she is cutting back  Vaping Use   Vaping Use: Never used  Substance and Sexual Activity   Alcohol use: Not Currently    Comment: Very arely    Drug use: No    Types: "Crack" cocaine    Comment: 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 x 2.5 weeks   Sexual activity: Yes    Partners: Male    Birth control/protection: Condom    Comment: given condoms  Other Topics Concern   Not on file  Social History Narrative   Lives with a friend at the moment    Social  Determinants of Health   Financial Resource Strain: Not on file  Food Insecurity: Not on file  Transportation Needs: Not on file  Physical Activity: Inactive (06/01/2022)   Exercise Vital Sign    Days of Exercise per Week: 0 days    Minutes of Exercise per Session: 0 min  Stress: Stress Concern Present (06/01/2022)   [place]    Feeling of Stress : To some extent  Social Connections: Not on file    Labs: Lab Results  Component Value Date   HIV1RNAQUANT Not Detected 01/24/2022   HIV1RNAQUANT <20 09/23/2020   HIV1RNAQUANT <20 NOT DETECTED 05/10/2020   CD4TABS 801 09/23/2020   CD4TABS 636 03/17/2020   CD4TABS 331 (L) 05/26/2019    RPR and STI Lab Results  Component Value Date   LABRPR NON-REACTIVE 01/24/2022   LABRPR NON-REACTIVE 09/23/2020   LABRPR NON-REACTIVE 03/17/2020   LABRPR NON-REACTIVE 05/26/2019   LABRPR NON REAC 09/29/2014    STI Results GC CT  05/18/2022 10:32 AM Negative  Negative   10/12/2020  1:54 PM Negative  Negative   09/23/2020  2:19 PM Negative  Negative   05/10/2020  2:07 PM Negative  Negative   03/17/2020  9:55 AM Negative  Negative   05/26/2019 12:00 AM Negative  Negative     Hepatitis B Lab Results  Component Value Date   HEPBSAB POS (A) 07/15/2013   HEPBSAG NEGATIVE 12/24/2014   HEPBCAB NON REACTIVE 12/24/2014   Hepatitis C No results found for: "HEPCAB", "HCVRNAPCRQN" Hepatitis A Lab Results  Component Value Date   HAV POS (A) 07/15/2013   Lipids: Lab Results  Component Value Date   CHOL 207 (H) 01/24/2022   TRIG 89 01/24/2022   HDL 60  01/24/2022   CHOLHDL 3.5 01/24/2022   VLDL 34 02/09/2015   LDLCALC 128 (H) 01/24/2022    Current HIV Regimen: Biktarvy  TARGET DATE: The 11th  Assessment: Theadora presents today for her first initiation injection for [place]. Counseled that [place] is two separate intramuscular injections in the gluteal muscle on each side for each visit. Explained that the second injection is 30 days after the initial injection then every 2 months thereafter. Discussed the rare but significant chance of developing resistance despite compliance. Explained that showing up to injection appointments is very important and warned that if 2 appointments are missed, it will be reassessed by their provider whether they are a good candidate for injection therapy. Counseled on possible side effects associated with the injections such as injection site pain, which is usually mild to moderate in nature, injection site nodules, and injection site reactions. Asked to call the clinic or send me a mychart message if they experience any issues, such as fatigue, nausea, headache, rash, or dizziness. Advised that they can take ibuprofen or tylenol for injection site pain if needed.   Administered cabotegravir '600mg'$ /72m in left upper outer quadrant of the gluteal muscle. Administered rilpivirine 900 mg/353min the right upper outer quadrant of the gluteal muscle. Monitored patient for 10 minutes after injection. Injections were tolerated well without issue. Counseled to stop taking Biktarvy after today's dose and to call with any issues that may arise. Will make follow up appointments for second initiation injection in 30 days and then maintenance injections every 2 months thereafter.   Plan: - Stop Biktarvy after today's dose - First Cabenuva injections administered - Second initiation injection scheduled for 07/11/22 with me - Call with any issues or questions  Cannon Arreola L. Brayden Betters, PharmD, BCIDP, AAHIVP, [place] Clinical  Pharmacist Practitioner [facility] for Infect[facility] Disease

## 2022-06-14 ENCOUNTER — Other Ambulatory Visit: Payer: Self-pay | Admitting: Internal Medicine

## 2022-06-14 ENCOUNTER — Other Ambulatory Visit (HOSPITAL_COMMUNITY): Payer: Self-pay

## 2022-06-14 LAB — T-HELPER CELL (CD4) - (RCID CLINIC ONLY)
CD4 % Helper T Cell: 37 % (ref 33–65)
CD4 T Cell Abs: 772 /uL (ref 400–1790)

## 2022-06-15 ENCOUNTER — Other Ambulatory Visit (HOSPITAL_COMMUNITY): Payer: Self-pay

## 2022-06-15 ENCOUNTER — Telehealth: Payer: Self-pay

## 2022-06-15 LAB — CBC WITH DIFFERENTIAL/PLATELET
Absolute Monocytes: 531 cells/uL (ref 200–950)
Basophils Absolute: 28 cells/uL (ref 0–200)
Basophils Relative: 0.4 %
Eosinophils Absolute: 21 cells/uL (ref 15–500)
Eosinophils Relative: 0.3 %
HCT: 40.2 % (ref 35.0–45.0)
Hemoglobin: 13.5 g/dL (ref 11.7–15.5)
Lymphs Abs: 2194 cells/uL (ref 850–3900)
MCH: 32.3 pg (ref 27.0–33.0)
MCHC: 33.6 g/dL (ref 32.0–36.0)
MCV: 96.2 fL (ref 80.0–100.0)
MPV: 9.2 fL (ref 7.5–12.5)
Monocytes Relative: 7.7 %
Neutro Abs: 4126 cells/uL (ref 1500–7800)
Neutrophils Relative %: 59.8 %
Platelets: 240 10*3/uL (ref 140–400)
RBC: 4.18 10*6/uL (ref 3.80–5.10)
RDW: 12.4 % (ref 11.0–15.0)
Total Lymphocyte: 31.8 %
WBC: 6.9 10*3/uL (ref 3.8–10.8)

## 2022-06-15 LAB — RPR: RPR Ser Ql: NONREACTIVE

## 2022-06-15 LAB — COMPLETE METABOLIC PANEL WITH GFR
AG Ratio: 1.8 (calc) (ref 1.0–2.5)
ALT: 14 U/L (ref 6–29)
AST: 15 U/L (ref 10–35)
Albumin: 4.4 g/dL (ref 3.6–5.1)
Alkaline phosphatase (APISO): 50 U/L (ref 31–125)
BUN: 12 mg/dL (ref 7–25)
CO2: 27 mmol/L (ref 20–32)
Calcium: 9.9 mg/dL (ref 8.6–10.2)
Chloride: 103 mmol/L (ref 98–110)
Creat: 0.81 mg/dL (ref 0.50–0.99)
Globulin: 2.5 g/dL (calc) (ref 1.9–3.7)
Glucose, Bld: 97 mg/dL (ref 65–99)
Potassium: 4.3 mmol/L (ref 3.5–5.3)
Sodium: 139 mmol/L (ref 135–146)
Total Bilirubin: 0.4 mg/dL (ref 0.2–1.2)
Total Protein: 6.9 g/dL (ref 6.1–8.1)
eGFR: 89 mL/min/{1.73_m2} (ref >=60)

## 2022-06-15 LAB — HIV-1 RNA QUANT-NO REFLEX-BLD
HIV 1 RNA Quant: NOT DETECTED Copies/mL
HIV-1 RNA Quant, Log: NOT DETECTED Log cps/mL

## 2022-06-15 NOTE — Telephone Encounter (Signed)
Please advise on refill.

## 2022-06-15 NOTE — Telephone Encounter (Signed)
[facility] Patient Advocate Encounter  Patient's medication Kern Reap) have been couriered to [facility] from [facility] and will be administered on the patient next office visit on 07/11/22.  Ileene Patrick , [facility] Patient [facility] for Infectious Disease Phone: [phone] Fax:  [phone]

## 2022-06-16 ENCOUNTER — Other Ambulatory Visit (HOSPITAL_COMMUNITY): Payer: Self-pay

## 2022-06-16 ENCOUNTER — Telehealth: Payer: Self-pay

## 2022-06-16 NOTE — Telephone Encounter (Signed)
Called pt to let her know that her epi pen was approved pharmacy has to order it will be ready Monday or Tuesday with a $4 co pay no answer left vm

## 2022-06-19 ENCOUNTER — Encounter (INDEPENDENT_AMBULATORY_CARE_PROVIDER_SITE_OTHER): Payer: Self-pay

## 2022-06-19 ENCOUNTER — Other Ambulatory Visit (HOSPITAL_COMMUNITY): Payer: Self-pay

## 2022-06-19 ENCOUNTER — Other Ambulatory Visit: Payer: Self-pay

## 2022-06-19 VITALS — BP 161/104 | HR 56 | Temp 98.0°F | Resp 16 | Wt 223.1 lb

## 2022-06-19 DIAGNOSIS — Z006 Encounter for examination for normal comparison and control in clinical research program: Secondary | ICD-10-CM

## 2022-06-19 MED ORDER — ENSURE PO LIQD: 237.0000 mL | Freq: Two times a day (BID) | ORAL | 6 refills | Status: DC

## 2022-06-19 NOTE — Research (Signed)
Participant seen for final study visit for [place].  Procedures carried out per study protocol.  Participant has elevated BP, reports she hasn't been taking Losartan.  Encouraged her to check in with PCP regarding medication and she may need to get medication refilled. Denies any headache, blurry vision, dizziness, chest pain, nausea today.  Encouraged to seek out emergency care if symptoms arise, verbalized understanding. Encouraged participant to discuss Statin therapy with her PCP due to the end of the study and the study medications being discontinued.

## 2022-06-20 ENCOUNTER — Other Ambulatory Visit (HOSPITAL_COMMUNITY): Payer: Self-pay

## 2022-06-20 MED ORDER — IBUPROFEN 800 MG PO TABS
800.0000 mg | ORAL_TABLET | Freq: Three times a day (TID) | ORAL | 0 refills | Status: DC | PRN
  Filled 2022-06-20 – 2022-06-21 (×3): qty 30, 10d supply, fill #0

## 2022-06-21 ENCOUNTER — Emergency Department (HOSPITAL_COMMUNITY): Payer: Medicaid Other

## 2022-06-21 ENCOUNTER — Other Ambulatory Visit: Payer: Self-pay

## 2022-06-21 ENCOUNTER — Encounter (HOSPITAL_COMMUNITY): Payer: Self-pay | Admitting: Emergency Medicine

## 2022-06-21 ENCOUNTER — Emergency Department (HOSPITAL_COMMUNITY)
Admission: EM | Admit: 2022-06-21 | Discharge: 2022-06-21 | Disposition: A | Discharge status: Home / Self Care | Payer: Medicaid Other | Attending: Emergency Medicine | Admitting: Emergency Medicine

## 2022-06-21 ENCOUNTER — Other Ambulatory Visit (HOSPITAL_COMMUNITY): Payer: Self-pay

## 2022-06-21 DIAGNOSIS — M25531 Pain in right wrist: Secondary | ICD-10-CM | POA: Insufficient documentation

## 2022-06-21 DIAGNOSIS — Z79899 Other long term (current) drug therapy: Secondary | ICD-10-CM | POA: Insufficient documentation

## 2022-06-21 DIAGNOSIS — I10 Essential (primary) hypertension: Secondary | ICD-10-CM | POA: Insufficient documentation

## 2022-06-21 NOTE — ED Notes (Signed)
Pt d/c home per MD order. Discharge summary reviewed, pt verbalizes understanding. Ambulatory off unit. No s/s of acute distress noted at discharge.  °

## 2022-06-21 NOTE — ED Provider Notes (Signed)
[facility] EMERGENCY DEPARTMENT Provider Note   CSN: [id number] Arrival date & time: 06/21/22  1020     History  Chief Complaint  Patient presents with   Wrist Pain    Rachel Underwood is a 51 y.o. female with significant history of osteoarthritis, fibromyalgia, also hypertension others per past medical history endorsing pain at her right wrist at the base of her thumb since waking today.  She denies any obvious injury but states she is a frequent sleepwalker and has injured herself in her sleep previously and is concerned for possible wrist injury today.  She denies weakness or numbness in her hand or fingertips.  Pain is worsened with flexing the thumb and wrist, better at rest.  She has perceived edema at the base of her right thumb.  She denies any other acute complaints of pain.  She has had no treatment prior to arrival.  Patient is right-handed.  She denies overuse or repetitive use of the hand recently.  The history is provided by the patient.       Home Medications Prior to Admission medications   Medication Sig Start Date End Date Taking? Authorizing Provider  acyclovir (ZOVIRAX) 400 MG tablet TAKE 1 TABLET BY MOUTH 2 TIMES DAILY AS NEEDED FOR PROPHYLAXIS 01/24/22 01/24/23  Carlyle Basques, MD  albuterol (VENTOLIN HFA) 108 (90 Base) MCG/ACT inhaler INHALE 2 PUFFS INTO THE LUNGS EVERY 4 HOURS AS NEEDED FOR WHEEZING OR SHORTNESS OF BREATH. 05/18/22 05/18/23  Paseda, Dewaine Conger, FNP  azelastine (ASTELIN) 0.1 % nasal spray Place 1 spray into both nostrils 2 (two) times daily. Patient not taking: Reported on 06/13/2022 10/12/20   Janora Norlander, DO  cabotegravir & rilpivirine ER (CABENUVA) 600 & 900 MG/3ML injection Inject 1 kit into the muscle every 30 (thirty) days. 06/13/22   Kuppelweiser, Cassie L, RPH-CPP  cabotegravir & rilpivirine ER (CABENUVA) 600 & 900 MG/3ML injection Inject 1 kit into the muscle every 2 (two) months. 06/13/22   Kuppelweiser, Cassie L, RPH-CPP  cromolyn  (OPTICROM) 4 % ophthalmic solution Place 1 drop into both eyes daily. 05/18/22   Paseda, Dewaine Conger, FNP  cycloSPORINE (RESTASIS) 0.05 % ophthalmic emulsion Place 1 drop into both eyes 2 (two) times daily. 05/18/22   Paseda, Dewaine Conger, FNP  Ensure (ENSURE) Take 237 mLs by mouth 2 (two) times daily between meals. 06/19/22   Carlyle Basques, MD  EPINEPHrine 0.3 mg/0.3 mL IJ SOAJ injection Inject 1 pen (0.3 mg) into the skin daily as needed for anaphylaxis. 05/18/22   Paseda, Dewaine Conger, FNP  fluconazole (DIFLUCAN) 200 MG tablet Take 1 tablet (200 mg total) by mouth daily. 06/09/22   Carlyle Basques, MD  hydrocortisone 2.5 % lotion Apply topically.    [provider]  ibuprofen (ADVIL) 800 MG tablet Take 1 tablet (800 mg total) by mouth every 8 (eight) hours as needed. 06/20/22   Carlyle Basques, MD  lamoTRIgine (LAMICTAL) 200 MG tablet TAKE 1 TABLET BY MOUTH ONCE A DAY 05/18/22 05/18/23  Renee Rival, FNP  levocetirizine (XYZAL) 5 MG tablet Take 1 tablet (5 mg total) by mouth every evening. 05/24/22   Paseda, Dewaine Conger, FNP  linaclotide (LINZESS) 290 MCG CAPS capsule Take 1 capsule by mouth daily on empty stomach 05/18/22   Paseda, Dewaine Conger, FNP  losartan (COZAAR) 50 MG tablet Take 1 tablet (50 mg total) by mouth daily. 02/21/22 02/21/23  Fransico Meadow, PA-C  nystatin (MYCOSTATIN/NYSTOP) powder Apply topically 3 times daily for up to 2 weeks  for rash under breast. 01/24/22   Carlyle Basques, MD  ondansetron (ZOFRAN-ODT) 4 MG disintegrating tablet Take 1 tablet (4 mg total) by mouth 3 (three) times daily as needed. 06/13/22   Carlyle Basques, MD  pantoprazole (PROTONIX) 40 MG tablet Take 1 tablet (40 mg total) by mouth daily. 06/13/22   Carlyle Basques, MD  propranolol (INDERAL) 20 MG tablet Take 1 tablet (20 mg total) by mouth 3 (three) times daily. 06/13/22   Carlyle Basques, MD  QUEtiapine (SEROQUEL) 200 MG tablet Take 2 tablets by mouth at bedtime 01/13/22     sertraline (ZOLOFT) 100 MG  tablet TAKE 2 TABLETS BY MOUTH ONCE DAILY 05/18/22 05/18/23  Renee Rival, FNP  Study - REPRIEVE [phone] - pitavastatin 4 mg or placebo tablet ([place]) Take 1 tablet by mouth daily.  [id number] I [id number] E ACTG [id number] 03/10/21   Robert Bellow, PA-C      Allergies    Flecainide, Piroxicam, Abilify [aripiprazole], Citalopram, Codeine, Depakote [divalproex sodium], Gabapentin, Haldol [haloperidol lactate], Haloperidol, Indomethacin, Invega [paliperidone], Latuda [lurasidone hcl], Lithium, Lurasidone, Promethazine, Trazodone and nefazodone, and Lexapro [escitalopram oxalate]    Review of Systems   Review of Systems  Constitutional:  Negative for fever.  Musculoskeletal:  Positive for arthralgias and joint swelling. Negative for myalgias.  Neurological:  Negative for weakness and numbness.  All other systems reviewed and are negative.   Physical Exam Updated Vital Signs BP (!) 161/110   Pulse (!) 58   Resp 18   Ht _0  (1.651 m)   Wt 103.9 kg   LMP 08/11/2014   SpO2 98%   BMI 38.11 kg/m  Physical Exam Constitutional:      Appearance: She is well-developed.  HENT:     Head: Atraumatic.  Cardiovascular:     Comments: Pulses equal bilaterally Musculoskeletal:        General: Tenderness present. No deformity.     Right wrist: Swelling and snuff box tenderness present. No deformity, effusion or crepitus. Normal pulse.     Cervical back: Normal range of motion.     Comments: There is mild soft tissue swelling at her right radial wrist, positive snuffbox tenderness and tender along her thumb extensor tendons.  No deformity, no erythema, skin is intact.  Skin:    General: Skin is warm and dry.  Neurological:     Mental Status: She is alert.     Sensory: No sensory deficit.     Motor: No weakness.     Deep Tendon Reflexes: Reflexes normal.     ED Results / Procedures / Treatments   Labs (all labs ordered are listed, but only abnormal results are displayed) Labs Reviewed -  No data to display  EKG None  Radiology No results found.  Xrays negative,  info would not cross to chart.  Procedures Procedures    Medications Ordered in ED Medications - No data to display  ED Course/ Medical Decision Making/ A&P                           Medical Decision Making Patient with right thumb and wrist pain suggesting a thumb sprain or soft tissue injury.  No known injury, although patient sleepwalks and is suspicious for having injured herself last night while sleeping.  Imaging is negative, exam suggest tendinitis versus strain/sprain.  She was given a Velcro thumb spica splint.  Advised ice, elevation, arthritis strength Tylenol if needed for pain relief.  She  does have an orthopedic specialist with the [facility] system, advised follow-up care with that provider if her symptoms are not improving over the next week.  Amount and/or Complexity of Data Reviewed Radiology: ordered and independent interpretation performed.    Details: Negative for acute injury.  Risk OTC drugs.           Final Clinical Impression(s) / ED Diagnoses Final diagnoses:  Acute pain of right wrist    Rx / DC Orders ED Discharge Orders     None         Landis Martins 06/21/22 1228    Dorie Rank, MD 06/24/22 [phone]

## 2022-06-21 NOTE — ED Triage Notes (Signed)
Per pt having right wrist pain, pt sleep walks and may have unknowingly injured wrist.

## 2022-06-21 NOTE — Discharge Instructions (Signed)
You are being treated for a thumb sprain based on today's exam and normal x-ray findings.  However I do recommend follow-up with your orthopedic specialist if your symptoms persist or worsen over the next week.  Use the splint for comfort, I also recommend ice and elevation is much as is comfortable to help reduce swelling and pain.  Also recommend arthritis strength Tylenol if needed for additional pain relief.

## 2022-06-22 ENCOUNTER — Other Ambulatory Visit (HOSPITAL_COMMUNITY): Payer: Self-pay

## 2022-06-23 ENCOUNTER — Other Ambulatory Visit (HOSPITAL_COMMUNITY): Payer: Self-pay

## 2022-06-28 ENCOUNTER — Other Ambulatory Visit (HOSPITAL_COMMUNITY): Payer: Self-pay

## 2022-06-28 NOTE — Telephone Encounter (Signed)
Called no answer left vm

## 2022-07-03 ENCOUNTER — Other Ambulatory Visit (HOSPITAL_COMMUNITY): Payer: Self-pay

## 2022-07-06 ENCOUNTER — Other Ambulatory Visit (HOSPITAL_COMMUNITY): Payer: Self-pay

## 2022-07-09 ENCOUNTER — Encounter (HOSPITAL_COMMUNITY): Payer: Self-pay

## 2022-07-09 ENCOUNTER — Emergency Department (HOSPITAL_COMMUNITY)
Admission: EM | Admit: 2022-07-09 | Discharge: 2022-07-09 | Disposition: A | Discharge status: Home / Self Care | Payer: Medicaid Other | Attending: Emergency Medicine | Admitting: Emergency Medicine

## 2022-07-09 DIAGNOSIS — R197 Diarrhea, unspecified: Secondary | ICD-10-CM | POA: Insufficient documentation

## 2022-07-09 DIAGNOSIS — Z5321 Procedure and treatment not carried out due to patient leaving prior to being seen by health care provider: Secondary | ICD-10-CM | POA: Insufficient documentation

## 2022-07-09 DIAGNOSIS — R110 Nausea: Secondary | ICD-10-CM | POA: Diagnosis not present

## 2022-07-09 LAB — CBC
HCT: 38.2 % (ref 36.0–46.0)
Hemoglobin: 12.9 g/dL (ref 12.0–15.0)
MCH: 32.4 pg (ref 26.0–34.0)
MCHC: 33.8 g/dL (ref 30.0–36.0)
MCV: 96 fL (ref 80.0–100.0)
Platelets: 193 10*3/uL (ref 150–400)
RBC: 3.98 MIL/uL (ref 3.87–5.11)
RDW: 12.7 % (ref 11.5–15.5)
WBC: 8 10*3/uL (ref 4.0–10.5)
nRBC: 0 % (ref 0.0–0.2)

## 2022-07-09 LAB — COMPREHENSIVE METABOLIC PANEL
ALT: 15 U/L (ref 0–44)
AST: 17 U/L (ref 15–41)
Albumin: 4.1 g/dL (ref 3.5–5.0)
Alkaline Phosphatase: 50 U/L (ref 38–126)
Anion gap: 7 (ref 5–15)
BUN: 16 mg/dL (ref 6–20)
CO2: 26 mmol/L (ref 22–32)
Calcium: 9.2 mg/dL (ref 8.9–10.3)
Chloride: 109 mmol/L (ref 98–111)
Creatinine, Ser: 0.75 mg/dL (ref 0.44–1.00)
GFR, Estimated: >60 mL/min (ref >60)
Glucose, Bld: 101 mg/dL — ABNORMAL HIGH (ref 70–99)
Potassium: 3.8 mmol/L (ref 3.5–5.1)
Sodium: 142 mmol/L (ref 135–145)
Total Bilirubin: 0.7 mg/dL (ref 0.3–1.2)
Total Protein: 7.2 g/dL (ref 6.5–8.1)

## 2022-07-09 LAB — LIPASE, BLOOD: Lipase: 29 U/L (ref 11–51)

## 2022-07-09 NOTE — ED Triage Notes (Signed)
Pt states she has had diarrhea and nausea x 1 week. About 5 episodes of diarrhea in last 24 hours. Pt's daughter recently had a stomach virus.

## 2022-07-10 ENCOUNTER — Other Ambulatory Visit (HOSPITAL_COMMUNITY): Payer: Self-pay

## 2022-07-11 ENCOUNTER — Other Ambulatory Visit (HOSPITAL_COMMUNITY): Payer: Self-pay

## 2022-07-11 ENCOUNTER — Encounter: Payer: Medicaid Other | Admitting: Pharmacist

## 2022-07-12 ENCOUNTER — Telehealth: Payer: Self-pay | Admitting: Pharmacist

## 2022-07-12 NOTE — Telephone Encounter (Signed)
Patient's specialty medication Kern Reap) was delivered from [facility] and will be administered at next office visit on 8/14.  Alfonse Spruce, PharmD, CPP, [place] Clinical Pharmacist Practitioner Infectious [place] for Infectious Disease

## 2022-07-14 ENCOUNTER — Other Ambulatory Visit (HOSPITAL_COMMUNITY): Payer: Self-pay

## 2022-07-17 ENCOUNTER — Encounter: Payer: Medicaid Other | Admitting: Pharmacist

## 2022-07-18 ENCOUNTER — Other Ambulatory Visit: Payer: Self-pay | Admitting: Internal Medicine

## 2022-07-18 ENCOUNTER — Other Ambulatory Visit: Payer: Self-pay | Admitting: Nurse Practitioner

## 2022-07-18 ENCOUNTER — Other Ambulatory Visit (HOSPITAL_COMMUNITY): Payer: Self-pay

## 2022-07-18 MED ORDER — LEVOCETIRIZINE DIHYDROCHLORIDE 5 MG PO TABS
5.0000 mg | ORAL_TABLET | Freq: Every evening | ORAL | 0 refills | Status: DC
  Filled 2022-07-18 – 2022-08-16 (×2): qty 90, 90d supply, fill #0

## 2022-07-19 ENCOUNTER — Other Ambulatory Visit (HOSPITAL_COMMUNITY): Payer: Self-pay

## 2022-07-19 MED ORDER — QUETIAPINE FUMARATE 200 MG PO TABS
400.0000 mg | ORAL_TABLET | Freq: Every day | ORAL | 3 refills | Status: DC
Start: 2022-07-19 — End: 2022-10-25
  Filled 2022-07-19: qty 60, 30d supply, fill #0
  Filled 2022-08-16: qty 60, 30d supply, fill #1
  Filled 2022-09-14: qty 60, 30d supply, fill #2
  Filled 2022-10-11: qty 60, 30d supply, fill #3

## 2022-07-21 ENCOUNTER — Other Ambulatory Visit (HOSPITAL_COMMUNITY): Payer: Self-pay

## 2022-07-21 ENCOUNTER — Encounter: Payer: Medicaid Other | Admitting: Pharmacist

## 2022-07-24 ENCOUNTER — Other Ambulatory Visit (HOSPITAL_COMMUNITY): Payer: Self-pay

## 2022-07-31 ENCOUNTER — Other Ambulatory Visit (HOSPITAL_COMMUNITY): Payer: Self-pay

## 2022-08-08 ENCOUNTER — Other Ambulatory Visit (HOSPITAL_COMMUNITY): Payer: Self-pay

## 2022-08-08 ENCOUNTER — Other Ambulatory Visit: Payer: Self-pay

## 2022-08-08 DIAGNOSIS — B20 Human immunodeficiency virus [HIV] disease: Secondary | ICD-10-CM

## 2022-08-08 MED ORDER — BICTEGRAVIR-EMTRICITAB-TENOFOV 50-200-25 MG PO TABS
1.0000 | ORAL_TABLET | Freq: Every day | ORAL | 0 refills | Status: DC
  Filled 2022-08-08 (×2): qty 30, 30d supply, fill #0

## 2022-08-08 NOTE — Progress Notes (Signed)
Per MD patient would like to restart Biktarvy. Refills sent today to pharmacy. Leatrice Jewels, RMA

## 2022-08-09 ENCOUNTER — Other Ambulatory Visit (HOSPITAL_COMMUNITY): Payer: Self-pay

## 2022-08-15 ENCOUNTER — Other Ambulatory Visit (HOSPITAL_COMMUNITY): Payer: Self-pay

## 2022-08-15 ENCOUNTER — Encounter: Payer: Self-pay | Admitting: Internal Medicine

## 2022-08-15 ENCOUNTER — Ambulatory Visit (INDEPENDENT_AMBULATORY_CARE_PROVIDER_SITE_OTHER): Payer: Medicaid Other | Admitting: Internal Medicine

## 2022-08-15 ENCOUNTER — Other Ambulatory Visit: Payer: Self-pay

## 2022-08-15 ENCOUNTER — Other Ambulatory Visit (HOSPITAL_COMMUNITY)
Admission: RE | Admit: 2022-08-15 | Discharge: 2022-08-15 | Disposition: A | Discharge status: Home / Self Care | Payer: Medicaid Other | Source: Ambulatory Visit | Attending: Internal Medicine | Admitting: Internal Medicine

## 2022-08-15 VITALS — BP 109/71 | HR 62 | Temp 98.1°F | Wt 240.0 lb

## 2022-08-15 DIAGNOSIS — N760 Acute vaginitis: Secondary | ICD-10-CM

## 2022-08-15 DIAGNOSIS — Z79899 Other long term (current) drug therapy: Secondary | ICD-10-CM

## 2022-08-15 DIAGNOSIS — B9689 Other specified bacterial agents as the cause of diseases classified elsewhere: Secondary | ICD-10-CM | POA: Diagnosis not present

## 2022-08-15 DIAGNOSIS — A5901 Trichomonal vulvovaginitis: Secondary | ICD-10-CM | POA: Diagnosis not present

## 2022-08-15 DIAGNOSIS — N898 Other specified noninflammatory disorders of vagina: Secondary | ICD-10-CM | POA: Diagnosis not present

## 2022-08-15 DIAGNOSIS — B20 Human immunodeficiency virus [HIV] disease: Secondary | ICD-10-CM | POA: Insufficient documentation

## 2022-08-15 MED ORDER — FLUCONAZOLE 150 MG PO TABS
150.0000 mg | ORAL_TABLET | Freq: Every day | ORAL | 0 refills | Status: DC
  Filled 2022-08-15: qty 7, 14d supply, fill #0

## 2022-08-15 MED ORDER — BICTEGRAVIR-EMTRICITAB-TENOFOV 50-200-25 MG PO TABS
1.0000 | ORAL_TABLET | Freq: Every day | ORAL | 11 refills | Status: DC
  Filled 2022-08-15 – 2022-09-01 (×2): qty 30, 30d supply, fill #0
  Filled 2022-10-13: qty 30, 30d supply, fill #1
  Filled 2022-11-15: qty 30, 30d supply, fill #2
  Filled 2022-12-05: qty 30, 30d supply, fill #3
  Filled 2022-12-28: qty 30, 30d supply, fill #4

## 2022-08-15 NOTE — Progress Notes (Signed)
RFV: follow up for hiv disease  Patient ID: Rachel Underwood, female   DOB: [birth year], 51 y.o.   MRN: [id number]  HPI 51 yo F with HIV disease, CD 4 count of 772/VL in July 2023, back on biktarvy She reports that she is unable to continue with cabenuva injections due to transportation issues. Also she is concerned about weight gain she states she was previously 216 and now 240.  Having some vaginal irritation? She thinks yeast infection. She is sexually active Outpatient Encounter Medications as of 08/15/2022  Medication Sig   acyclovir (ZOVIRAX) 400 MG tablet TAKE 1 TABLET BY MOUTH 2 TIMES DAILY AS NEEDED FOR PROPHYLAXIS   albuterol (VENTOLIN HFA) 108 (90 Base) MCG/ACT inhaler INHALE 2 PUFFS INTO THE LUNGS EVERY 4 HOURS AS NEEDED FOR WHEEZING OR SHORTNESS OF BREATH.   bictegravir-emtricitabine-tenofovir AF (BIKTARVY) 50-200-25 MG TABS tablet TAKE 1 TABLET BY MOUTH DAILY.   cromolyn (OPTICROM) 4 % ophthalmic solution Place 1 drop into both eyes daily.   cycloSPORINE (RESTASIS) 0.05 % ophthalmic emulsion Place 1 drop into both eyes 2 (two) times daily.   Ensure (ENSURE) Take 237 mLs by mouth 2 (two) times daily between meals.   EPINEPHrine 0.3 mg/0.3 mL IJ SOAJ injection Inject 1 pen (0.3 mg) into the skin daily as needed for anaphylaxis.   fluconazole (DIFLUCAN) 200 MG tablet Take 1 tablet (200 mg total) by mouth daily.   hydrocortisone 2.5 % lotion Apply topically.   ibuprofen (ADVIL) 800 MG tablet Take 1 tablet (800 mg total) by mouth every 8 (eight) hours as needed.   lamoTRIgine (LAMICTAL) 200 MG tablet TAKE 1 TABLET BY MOUTH ONCE A DAY   levocetirizine (XYZAL) 5 MG tablet Take 1 tablet (5 mg total) by mouth every evening.   linaclotide (LINZESS) 290 MCG CAPS capsule Take 1 capsule by mouth daily on empty stomach   losartan (COZAAR) 50 MG tablet Take 1 tablet (50 mg total) by mouth daily.   nystatin (MYCOSTATIN/NYSTOP) powder Apply topically 3 times daily for up to 2 weeks for rash under  breast.   ondansetron (ZOFRAN-ODT) 4 MG disintegrating tablet Dissolve 1 tablet (4 mg total) by mouth 3 (three) times daily as needed.   pantoprazole (PROTONIX) 40 MG tablet Take 1 tablet (40 mg total) by mouth daily.   propranolol (INDERAL) 20 MG tablet Take 1 tablet (20 mg total) by mouth 3 (three) times daily.   QUEtiapine (SEROQUEL) 200 MG tablet Take 2 tablets by mouth at bedtime   sertraline (ZOLOFT) 100 MG tablet TAKE 2 TABLETS BY MOUTH ONCE DAILY   Study - REPRIEVE [phone] - pitavastatin 4 mg or placebo tablet (PI-[place]) Take 1 tablet by mouth daily.  [id number] I [id number] E ACTG L0786   azelastine (ASTELIN) 0.1 % nasal spray Place 1 spray into both nostrils 2 (two) times daily. (Patient not taking: Reported on 06/13/2022)   cabotegravir & rilpivirine ER (CABENUVA) 600 & 900 MG/3ML injection Inject 1 kit into the muscle every 30 (thirty) days. (Patient not taking: Reported on 08/15/2022)   cabotegravir & rilpivirine ER (CABENUVA) 600 & 900 MG/3ML injection Inject 1 kit into the muscle every 2 (two) months. (Patient not taking: Reported on 08/15/2022)   No facility-administered encounter medications on file as of 08/15/2022.     Patient Active Problem List   Diagnosis Date Noted   PSVT (paroxysmal supraventricular tachycardia) ([place]) 05/18/2022   Morbid obesity ([place]) 05/18/2022   Cocaine abuse ([place]) 05/18/2022   High risk sexual behavior 05/18/2022  Asthma 05/18/2022   Plantar wart 02/01/2021   Tobacco abuse disorder 02/01/2021   Positive ANA (antinuclear antibody) 01/28/2021   Arthritis 01/28/2021   Rash 01/28/2021   Fibromyalgia syndrome 01/28/2021   Bilateral carpal tunnel syndrome 02/17/2016   Allergic rhinitis due to pollen 02/17/2016   Headache 11/09/2015   Chronic back pain 11/09/2015   Diarrhea 05/19/2015   Cysts of both ovaries 05/12/2015   Abdominal wall hernia 02/03/2015   Abdominal wall mass of periumbilical region 50/56/9794   Schatzki's ring    Diverticulosis of  colon without hemorrhage    Other hemorrhoids    Rectal bleeding 12/11/2014   GERD (gastroesophageal reflux disease) 12/11/2014   Esophageal dysphagia 12/11/2014   Constipation 12/11/2014   S/P hysterectomy 09/01/2014   Pelvic pain in female 07/22/2014   Anovulation 06/18/2014   DUB (dysfunctional uterine bleeding) 06/18/2014   Menorrhagia with irregular cycle 06/03/2014   Enlarged uterus 06/03/2014   HTN (hypertension) 01/15/2014   Human immunodeficiency virus (HIV) disease ([place]) 07/17/2013   Schizophrenia ([place]) 07/17/2013   Bipolar disorder, unspecified ([place]) 07/17/2013     Health Maintenance Due  Topic Date Due   MAMMOGRAM  01/27/2017   PAP SMEAR-Modifier  10/12/2021   INFLUENZA VACCINE  07/04/2022     Review of Systems Review of Systems  Constitutional: Negative for fever, chills, diaphoresis, activity change, appetite change, fatigue and unexpected weight change.  HENT: Negative for congestion, sore throat, rhinorrhea, sneezing, trouble swallowing and sinus pressure.  Eyes: Negative for photophobia and visual disturbance.  Respiratory: Negative for cough, chest tightness, shortness of breath, wheezing and stridor.  Cardiovascular: Negative for chest pain, palpitations and leg swelling.  Gastrointestinal: Negative for nausea, vomiting, abdominal pain, diarrhea, constipation, blood in stool, abdominal distention and anal bleeding.  Genitourinary: +vaginal discharge Negative for dysuria, hematuria, flank pain and difficulty urinating.  Musculoskeletal: Negative for myalgias, back pain, joint swelling, arthralgias and gait problem.  Skin: Negative for color change, pallor, rash and wound.  Neurological: Negative for dizziness, tremors, weakness and light-headedness.  Hematological: Negative for adenopathy. Does not bruise/bleed easily.  Psychiatric/Behavioral: Negative for behavioral problems, confusion, sleep disturbance, dysphoric mood, decreased concentration and agitation.    Physical Exam   BP 109/71   Pulse 62   Temp 98.1 F (36.7 C) (Oral)   Wt 240 lb (108.9 kg)   LMP 08/11/2014   SpO2 96%   BMI 39.94 kg/m    Lab Results  Component Value Date   CD4TCELL 37 06/13/2022   Lab Results  Component Value Date   CD4TABS 772 06/13/2022   CD4TABS 801 09/23/2020   CD4TABS 636 03/17/2020   Lab Results  Component Value Date   HIV1RNAQUANT Not Detected 06/13/2022   Lab Results  Component Value Date   HEPBSAB POS (A) 07/15/2013   Lab Results  Component Value Date   LABRPR NON-REACTIVE 06/13/2022    CBC Lab Results  Component Value Date   WBC 8.0 07/09/2022   RBC 3.98 07/09/2022   HGB 12.9 07/09/2022   HCT 38.2 07/09/2022   PLT 193 07/09/2022   MCV 96.0 07/09/2022   MCH 32.4 07/09/2022   MCHC 33.8 07/09/2022   RDW 12.7 07/09/2022   LYMPHSABS 2,194 06/13/2022   MONOABS 528 05/10/2016   EOSABS 21 06/13/2022    BMET Lab Results  Component Value Date   NA 142 07/09/2022   K 3.8 07/09/2022   CL 109 07/09/2022   CO2 26 07/09/2022   GLUCOSE 101 (H) 07/09/2022   BUN 16  07/09/2022   CREATININE 0.75 07/09/2022   CALCIUM 9.2 07/09/2022   GFRNONAA >60 07/09/2022   GFRAA 88 09/23/2020      Assessment and Plan HIV disease = will check abs; and swich back to bitkarvy. Redo refills  Annual check up with pap this Friday Self swab -- for BV vs. Yeast Weight gain = will check hemoglobin a1c since FH and also thyroid testing  -----  She will need treatment for trichomonas and BV. We will ask her partner to be treated as well.

## 2022-08-16 ENCOUNTER — Other Ambulatory Visit: Payer: Self-pay | Admitting: Nurse Practitioner

## 2022-08-16 ENCOUNTER — Other Ambulatory Visit: Payer: Self-pay | Admitting: Internal Medicine

## 2022-08-16 ENCOUNTER — Other Ambulatory Visit (HOSPITAL_COMMUNITY): Payer: Self-pay

## 2022-08-16 DIAGNOSIS — J301 Allergic rhinitis due to pollen: Secondary | ICD-10-CM

## 2022-08-16 DIAGNOSIS — J4520 Mild intermittent asthma, uncomplicated: Secondary | ICD-10-CM

## 2022-08-16 DIAGNOSIS — F3170 Bipolar disorder, currently in remission, most recent episode unspecified: Secondary | ICD-10-CM

## 2022-08-16 LAB — CERVICOVAGINAL ANCILLARY ONLY
Bacterial Vaginitis (gardnerella): POSITIVE — AB
Candida Glabrata: NEGATIVE
Candida Vaginitis: NEGATIVE
Chlamydia: NEGATIVE
Comment: NEGATIVE
Comment: NEGATIVE
Comment: NEGATIVE
Comment: NEGATIVE
Comment: NEGATIVE
Comment: NORMAL
Neisseria Gonorrhea: NEGATIVE
Trichomonas: POSITIVE — AB

## 2022-08-16 LAB — URINE CYTOLOGY ANCILLARY ONLY
Chlamydia: NEGATIVE
Comment: NEGATIVE
Comment: NEGATIVE
Comment: NORMAL
Neisseria Gonorrhea: NEGATIVE
Trichomonas: POSITIVE — AB

## 2022-08-16 LAB — T-HELPER CELL (CD4) - (RCID CLINIC ONLY)
CD4 % Helper T Cell: 36 % (ref 33–65)
CD4 T Cell Abs: 846 /uL (ref 400–1790)

## 2022-08-16 MED ORDER — LAMOTRIGINE 200 MG PO TABS
ORAL_TABLET | Freq: Every day | ORAL | 3 refills | Status: DC
  Filled 2022-08-16: qty 30, 30d supply, fill #0
  Filled 2022-09-14: qty 30, 30d supply, fill #1
  Filled 2022-10-11: qty 30, 30d supply, fill #2
  Filled 2022-10-23: qty 30, 30d supply, fill #3

## 2022-08-16 MED ORDER — ALBUTEROL SULFATE HFA 108 (90 BASE) MCG/ACT IN AERS
2.0000 | INHALATION_SPRAY | RESPIRATORY_TRACT | 2 refills | Status: DC | PRN
  Filled 2022-08-16: qty 18, 16d supply, fill #0
  Filled 2022-10-23: qty 18, 16d supply, fill #1
  Filled 2022-11-15: qty 18, 16d supply, fill #2

## 2022-08-16 NOTE — Telephone Encounter (Signed)
Please advise on refill.

## 2022-08-17 ENCOUNTER — Telehealth: Payer: Self-pay

## 2022-08-17 ENCOUNTER — Other Ambulatory Visit (HOSPITAL_COMMUNITY): Payer: Self-pay

## 2022-08-17 DIAGNOSIS — A599 Trichomoniasis, unspecified: Secondary | ICD-10-CM

## 2022-08-17 MED ORDER — METRONIDAZOLE 500 MG PO TABS
500.0000 mg | ORAL_TABLET | Freq: Two times a day (BID) | ORAL | 0 refills | Status: AC
  Filled 2022-08-17: qty 14, 7d supply, fill #0

## 2022-08-17 MED ORDER — IBUPROFEN 800 MG PO TABS
800.0000 mg | ORAL_TABLET | Freq: Three times a day (TID) | ORAL | 0 refills | Status: DC | PRN
  Filled 2022-08-17: qty 30, 10d supply, fill #0

## 2022-08-17 NOTE — Telephone Encounter (Signed)
Called patient regarding positive test for Trichomonas and Bacterial V. Per Dr. Baxter Flattery can send in metronidazole 500 mg BID x7 days. Will need to refrain from sexual activity and wait additional 7 days before resuming sexual activity. Understands partners must get tested/ treated.  Will send prescription to [facility] long outpatient pharmacy. Leatrice Jewels, RMA

## 2022-08-18 ENCOUNTER — Encounter: Payer: Self-pay | Admitting: Nurse Practitioner

## 2022-08-18 ENCOUNTER — Encounter: Payer: Medicaid Other | Admitting: Nurse Practitioner

## 2022-08-18 ENCOUNTER — Other Ambulatory Visit (HOSPITAL_COMMUNITY): Payer: Self-pay

## 2022-08-18 ENCOUNTER — Ambulatory Visit (INDEPENDENT_AMBULATORY_CARE_PROVIDER_SITE_OTHER): Payer: Medicaid Other | Admitting: Nurse Practitioner

## 2022-08-18 DIAGNOSIS — J301 Allergic rhinitis due to pollen: Secondary | ICD-10-CM

## 2022-08-18 DIAGNOSIS — J069 Acute upper respiratory infection, unspecified: Secondary | ICD-10-CM | POA: Diagnosis not present

## 2022-08-18 LAB — HIV-1 RNA QUANT-NO REFLEX-BLD
HIV 1 RNA Quant: NOT DETECTED Copies/mL
HIV-1 RNA Quant, Log: NOT DETECTED Log cps/mL

## 2022-08-18 LAB — LIPID PANEL
Cholesterol: 225 mg/dL — ABNORMAL HIGH (ref <200)
HDL: 67 mg/dL (ref >=50)
LDL Cholesterol (Calc): 131 mg/dL (calc) — ABNORMAL HIGH
Non-HDL Cholesterol (Calc): 158 mg/dL (calc) — ABNORMAL HIGH (ref <130)
Total CHOL/HDL Ratio: 3.4 (calc) (ref <5.0)
Triglycerides: 153 mg/dL — ABNORMAL HIGH (ref <150)

## 2022-08-18 LAB — HEMOGLOBIN A1C
Hgb A1c MFr Bld: 5.1 % of total Hgb (ref <5.7)
Mean Plasma Glucose: 100 mg/dL
eAG (mmol/L): 5.5 mmol/L

## 2022-08-18 LAB — TSH+FREE T4: TSH W/REFLEX TO FT4: 1.27 mIU/L

## 2022-08-18 LAB — RPR: RPR Ser Ql: NONREACTIVE

## 2022-08-18 MED ORDER — MONTELUKAST SODIUM 10 MG PO TABS
10.0000 mg | ORAL_TABLET | Freq: Every day | ORAL | 3 refills | Status: DC
  Filled 2022-08-18: qty 30, 30d supply, fill #0
  Filled 2022-10-23: qty 30, 30d supply, fill #1
  Filled 2022-11-15: qty 30, 30d supply, fill #2
  Filled 2022-12-17 – 2022-12-28 (×2): qty 30, 30d supply, fill #3

## 2022-08-18 MED ORDER — BENZONATATE 100 MG PO CAPS
100.0000 mg | ORAL_CAPSULE | Freq: Two times a day (BID) | ORAL | 0 refills | Status: DC | PRN
  Filled 2022-08-18: qty 20, 10d supply, fill #0

## 2022-08-18 NOTE — Assessment & Plan Note (Signed)
Uncontrolled with zyzal , cannot tolerate azelastine and Flonase Start Singulair '10mg'$  daily  Patient encouraged to abstain from smoking and vaping

## 2022-08-18 NOTE — Progress Notes (Signed)
Virtual Visit via Telephone Note  I connected with Rachel Underwood @ on 08/18/22 at [phone] telephone and verified that I am speaking with the correct person using two identifiers.  I spent 8 minutes on this telephone encounter  Location: Patient: home Provider: office   I discussed the limitations, risks, security and privacy concerns of performing an evaluation and management service by telephone and the availability of in person appointments. I also discussed with the patient that there may be a patient responsible charge related to this service. The patient expressed understanding and agreed to proceed.   History of Present Illness: Ms. Rachel Underwood presents with complaints of coughing with clear sputum,  sneezing, runny nose, chest congestion, fever that started 4 days ago .  She denies any known flu or COVID contact .  She has been taking Xyzal 5 mg daily but no improvement . Takes tylenol and ibuprofen as needed for fever.  Not taking Flonase or azelastine nasal spray because they burn her nose .  She has stopped smoking 5 days ago and started vaping. She denies bloody sputum , worsening wheezing, SOB, chest pain.  Her grand baby is also sick but does not have COVID or flu.        Observations/Objective:   Assessment and Plan: Allergic rhinitis due to pollen Uncontrolled with zyzal , cannot tolerate azelastine and Flonase Start Singulair '10mg'$  daily  Patient encouraged to abstain from smoking and vaping  URI (upper respiratory infection) Denies known COVID and flu contact Tessalon 100 mg twice daily as needed ordered for cough Take OTC Tylenol or ibuprofen as needed for fever Patient encouraged to drink plenty of fluids to maintain hydration   Follow Up Instructions:    I discussed the assessment and treatment plan with the patient. The patient was provided an opportunity to ask questions and all were answered. The patient agreed with the plan and demonstrated an understanding of the  instructions.   The patient was advised to call back or seek an in-person evaluation if the symptoms worsen or if the condition fails to improve as anticipated.

## 2022-08-18 NOTE — Assessment & Plan Note (Signed)
Denies known COVID and flu contact Tessalon 100 mg twice daily as needed ordered for cough Take OTC Tylenol or ibuprofen as needed for fever Patient encouraged to drink plenty of fluids to maintain hydration

## 2022-08-18 NOTE — Patient Instructions (Signed)
Please take singular 10 mg daily for your allergies stop taking Xyzal. Take Tessalon 100 mg twice daily as needed for cough Take over-the-counter Tylenol or ibuprofen as needed for fever Please drink at least 64 ounces of water daily to maintain hydration. Please call the office in 5 days if your symptoms does not improve.     Thanks for choosing [facility], we consider it a privelige to serve you.

## 2022-08-28 ENCOUNTER — Other Ambulatory Visit (HOSPITAL_COMMUNITY): Payer: Self-pay

## 2022-08-29 ENCOUNTER — Encounter: Payer: Medicaid Other | Admitting: Family Medicine

## 2022-09-01 ENCOUNTER — Other Ambulatory Visit (HOSPITAL_COMMUNITY): Payer: Self-pay

## 2022-09-04 ENCOUNTER — Encounter: Payer: Self-pay | Admitting: Family Medicine

## 2022-09-04 ENCOUNTER — Other Ambulatory Visit (HOSPITAL_COMMUNITY): Payer: Self-pay

## 2022-09-04 ENCOUNTER — Ambulatory Visit: Payer: Medicaid Other | Admitting: Internal Medicine

## 2022-09-04 ENCOUNTER — Other Ambulatory Visit: Payer: Self-pay

## 2022-09-04 ENCOUNTER — Ambulatory Visit (INDEPENDENT_AMBULATORY_CARE_PROVIDER_SITE_OTHER): Payer: Medicaid Other | Admitting: Family Medicine

## 2022-09-04 DIAGNOSIS — J189 Pneumonia, unspecified organism: Secondary | ICD-10-CM | POA: Diagnosis not present

## 2022-09-04 MED ORDER — AMOXICILLIN-POT CLAVULANATE 875-125 MG PO TABS
1.0000 | ORAL_TABLET | Freq: Two times a day (BID) | ORAL | 0 refills | Status: AC
  Filled 2022-09-04: qty 10, 5d supply, fill #0

## 2022-09-04 MED ORDER — AZITHROMYCIN 500 MG PO TABS
500.0000 mg | ORAL_TABLET | Freq: Every day | ORAL | 0 refills | Status: AC
  Filled 2022-09-04: qty 3, 3d supply, fill #0

## 2022-09-04 NOTE — Progress Notes (Signed)
Virtual Visit via Telephone Note    Due to her co-morbid illnesses, this patient is at least at moderate risk for complications without adequate follow up.  This format is felt to be most appropriate for this patient at this time.  The patient did not have access to video technology/had technical difficulties with video requiring transitioning to audio format only (telephone).  All issues noted in this document were discussed and addressed.  No physical exam could be performed with this format.   Evaluation Performed:  Acute visit  Date:  09/04/2022   ID:  Rachel Underwood, DOB 12-May-1971, MRN [id number]  Patient Location: Home Provider Location: Clinic  Participants: Patient Location of Patient: Home Location of Provider: clinic Consent was obtain for visit to be over via telehealth. I verified that I am speaking with the correct person using two identifiers.  PCP:  Alvira Monday, FNP   Chief Complaint:  cough and fever  History of Present Illness:    Rachel Underwood is a 51 y.o. female with complaints of fever, dyspnea,cough, and sputum production.Onset of symptoms since 08/18/22. Her highest temperature is 101.8, and her lowest recently is 99.8.  She was treated for allergic rhinitis on 08/18/2022, and she notes minimal relief of her symptoms.  Home COVID test is negative.  She complains of runny nose, nasal congestion, headaches, sore throat and shortness of breath.  She reports taking Tessalon Per[place] for cough with minimal relief. She has also taken Mucinex , Tylenol cold and flu  with minimal relief of symptoms.   The patient does have symptoms concerning for COVID-19 infection (fever, chills, cough, or new shortness of breath).   Past Medical, Surgical, Social History, Allergies, and Medications have been Reviewed.  Past Medical History:  Diagnosis Date   Abnormal Pap smear of cervix 2005(EST)   ckc for abnl pap- [facility] [place]   Allergy    Asthma    Bipolar 1  disorder ([place])    Borderline personality disorder ([place])    BPD (bronchopulmonary dysplasia)    Carpal tunnel syndrome    bilateral wrist   Depression    Dysmenorrhea 06/03/2014   Enlarged uterus 06/03/2014   Fibromyalgia    GERD (gastroesophageal reflux disease)    Herpes simplex without mention of complication    HIV (human immunodeficiency virus infection) ([place])    Hyperlipidemia    Hypertension    Menorrhagia with irregular cycle 06/03/2014   Osteoarthritis    Seizures ([place])    had as a teenager, unknown etiology and no meds.   Past Surgical History:  Procedure Laterality Date   ABDOMINAL HYSTERECTOMY N/A 09/01/2014   Procedure: HYSTERECTOMY ABDOMINAL;  Surgeon: Jonnie Kind, MD;  Location: [facility];  Service: Gynecology;  Laterality: N/A;   ANKLE SURGERY Right    repair of ligaments   BACK SURGERY     BILATERAL SALPINGECTOMY Bilateral 09/01/2014   Procedure: BILATERAL SALPINGECTOMY;  Surgeon: Jonnie Kind, MD;  Location: [facility];  Service: Gynecology;  Laterality: Bilateral;   CESAREAN SECTION     ckc     COLONOSCOPY N/A 12/23/2014   JME:QAST canal hemorrhoids left-sided diverticula   ESOPHAGOGASTRODUODENOSCOPY N/A 12/23/2014   MHD:QQIWLNL reflux/noncritical shatzkis ring s/p dilation/small HH   EXCISION OF SKIN TAG N/A 09/01/2014   Procedure: EXCISION OF SKIN NEVUS;  Surgeon: Jonnie Kind, MD;  Location: [facility];  Service: Gynecology;  Laterality: N/A;   FRACTURE SURGERY     MALONEY DILATION N/A 12/23/2014  Procedure: MALONEY DILATION;  Surgeon: Daneil Dolin, MD;  Location: [facility];  Service: Endoscopy;  Laterality: N/A;   SAVORY DILATION N/A 12/23/2014   Procedure: SAVORY DILATION;  Surgeon: Daneil Dolin, MD;  Location: [facility];  Service: Endoscopy;  Laterality: N/A;   SCAR REVISION N/A 09/01/2014   Procedure: EXCISION OF CICATRIX;  Surgeon: Jonnie Kind, MD;  Location: [facility];  Service: Gynecology;  Laterality: N/A;   SHOULDER SURGERY Right    rotator  cuff   SPINE SURGERY       Current Meds  Medication Sig   acyclovir (ZOVIRAX) 400 MG tablet TAKE 1 TABLET BY MOUTH 2 TIMES DAILY AS NEEDED FOR PROPHYLAXIS   albuterol (VENTOLIN HFA) 108 (90 Base) MCG/ACT inhaler INHALE 2 PUFFS INTO THE LUNGS EVERY 4 HOURS AS NEEDED FOR WHEEZING OR SHORTNESS OF BREATH.   amoxicillin-clavulanate (AUGMENTIN) 875-125 MG tablet Take 1 tablet by mouth 2 (two) times daily for 5 days.   azelastine (ASTELIN) 0.1 % nasal spray Place 1 spray into both nostrils 2 (two) times daily.   azithromycin (ZITHROMAX) 500 MG tablet Take 1 tablet (500 mg total) by mouth daily for 3 days.   benzonatate (TESSALON) 100 MG capsule Take 1 capsule (100 mg total) by mouth 2 (two) times daily as needed for cough.   bictegravir-emtricitabine-tenofovir AF (BIKTARVY) 50-200-25 MG TABS tablet TAKE 1 TABLET BY MOUTH DAILY.   cromolyn (OPTICROM) 4 % ophthalmic solution Place 1 drop into both eyes daily.   cycloSPORINE (RESTASIS) 0.05 % ophthalmic emulsion Place 1 drop into both eyes 2 (two) times daily.   Ensure (ENSURE) Take 237 mLs by mouth 2 (two) times daily between meals.   EPINEPHrine 0.3 mg/0.3 mL IJ SOAJ injection Inject 1 pen (0.3 mg) into the skin daily as needed for anaphylaxis.   fluconazole (DIFLUCAN) 150 MG tablet Take 1 tablet (150 mg total) by mouth daily. Can repeat every 2-3 days for yeast infection   hydrocortisone 2.5 % lotion Apply topically.   ibuprofen (ADVIL) 800 MG tablet Take 1 tablet (800 mg total) by mouth every 8 (eight) hours as needed.   lamoTRIgine (LAMICTAL) 200 MG tablet TAKE 1 TABLET BY MOUTH ONCE A DAY   linaclotide (LINZESS) 290 MCG CAPS capsule Take 1 capsule by mouth daily on empty stomach   losartan (COZAAR) 50 MG tablet Take 1 tablet (50 mg total) by mouth daily.   montelukast (SINGULAIR) 10 MG tablet Take 1 tablet (10 mg total) by mouth at bedtime.   nystatin (MYCOSTATIN/NYSTOP) powder Apply topically 3 times daily for up to 2 weeks for rash under  breast.   ondansetron (ZOFRAN-ODT) 4 MG disintegrating tablet Dissolve 1 tablet (4 mg total) by mouth 3 (three) times daily as needed.   pantoprazole (PROTONIX) 40 MG tablet Take 1 tablet (40 mg total) by mouth daily.   propranolol (INDERAL) 20 MG tablet Take 1 tablet (20 mg total) by mouth 3 (three) times daily.   QUEtiapine (SEROQUEL) 200 MG tablet Take 2 tablets by mouth at bedtime   sertraline (ZOLOFT) 100 MG tablet TAKE 2 TABLETS BY MOUTH ONCE DAILY   Study - REPRIEVE [phone] - pitavastatin 4 mg or placebo tablet (PI-[place]) Take 1 tablet by mouth daily.  [id number] I [id number] E ACTG [id number]     Allergies:   Flecainide, Piroxicam, Abilify [aripiprazole], Citalopram, Codeine, Depakote [divalproex sodium], Gabapentin, Haldol [haloperidol lactate], Haloperidol, Indomethacin, Invega [paliperidone], Latuda [lurasidone hcl], Lithium, Lurasidone, Promethazine, Trazodone and nefazodone, and Lexapro [escitalopram oxalate]  ROS:   Please see the history of present illness.     All other systems reviewed and are negative.   Labs/Other Tests and Data Reviewed:    Recent Labs: 07/09/2022: ALT 15; BUN 16; Creatinine, Ser 0.75; Hemoglobin 12.9; Platelets 193; Potassium 3.8; Sodium 142   Recent Lipid Panel Lab Results  Component Value Date/Time   CHOL 225 (H) 08/15/2022 02:06 PM   CHOL 201 (H) 06/20/2019 12:04 PM   TRIG 153 (H) 08/15/2022 02:06 PM   HDL 67 08/15/2022 02:06 PM   HDL 48 06/20/2019 12:04 PM   CHOLHDL 3.4 08/15/2022 02:06 PM   LDLCALC 131 (H) 08/15/2022 02:06 PM    Wt Readings from Last 3 Encounters:  08/15/22 240 lb (108.9 kg)  06/21/22 229 lb (103.9 kg)  06/19/22 223 lb 1.6 oz (101.2 kg)     Objective:    Vital Signs:  LMP 08/11/2014      ASSESSMENT & PLAN:   CAP Given her symptom duration, she will be treated for pneumonia as she is not a candidate for antiviral therapy for influenza The patient is a smoker, and she is HIV positive She smokes half a pack of cigarettes  daily She was recently treated with Flagyl in the last 3 weeks for trichomonas and bacterial vaginitis She will be treated with Augmentin for 5 days plus azithromycin 500 mg twice daily for 3 days She is being treated with azithromycin to target atypical pathogens Encouraged to get a chest x-ray to confirm the diagnosis of pneumonia      Today, I have spent 8 minutes reviewing the chart, including problem list, medications, and with the patient with telehealth technology discussing the above problems. .  Medication Adjustments/Labs and Tests Ordered: Current medicines are reviewed at length with the patient today.  Concerns regarding medicines are outlined above.   Tests Ordered: Orders Placed This Encounter  Procedures   DG Chest 2 View     Medication Changes: Meds ordered this encounter  Medications   azithromycin (ZITHROMAX) 500 MG tablet    Sig: Take 1 tablet (500 mg total) by mouth daily for 3 days.    Dispense:  3 tablet    Refill:  0   amoxicillin-clavulanate (AUGMENTIN) 875-125 MG tablet    Sig: Take 1 tablet by mouth 2 (two) times daily for 5 days.    Dispense:  10 tablet    Refill:  0     Note: This dictation was prepared with Dragon dictation along with smaller phrase technology. Similar sounding words can be transcribed inadequately or may not be corrected upon review. Any transcriptional errors that result from this process are unintentional.      Disposition:  Follow up  Signed, Alvira Monday, FNP  09/04/2022 9:40 PM     [employer]

## 2022-09-11 ENCOUNTER — Other Ambulatory Visit (HOSPITAL_COMMUNITY): Payer: Self-pay

## 2022-09-14 ENCOUNTER — Other Ambulatory Visit: Payer: Self-pay | Admitting: Nurse Practitioner

## 2022-09-14 ENCOUNTER — Other Ambulatory Visit (HOSPITAL_COMMUNITY): Payer: Self-pay

## 2022-09-14 DIAGNOSIS — F3170 Bipolar disorder, currently in remission, most recent episode unspecified: Secondary | ICD-10-CM

## 2022-09-15 ENCOUNTER — Other Ambulatory Visit (HOSPITAL_COMMUNITY): Payer: Self-pay

## 2022-09-18 ENCOUNTER — Other Ambulatory Visit (HOSPITAL_COMMUNITY): Payer: Self-pay

## 2022-09-27 ENCOUNTER — Other Ambulatory Visit (HOSPITAL_COMMUNITY): Payer: Self-pay

## 2022-10-03 ENCOUNTER — Ambulatory Visit: Payer: Medicaid Other | Admitting: Internal Medicine

## 2022-10-03 ENCOUNTER — Ambulatory Visit: Payer: Medicaid Other | Attending: Internal Medicine | Admitting: Internal Medicine

## 2022-10-03 ENCOUNTER — Encounter: Payer: Self-pay | Admitting: Internal Medicine

## 2022-10-03 VITALS — BP 128/78 | HR 63 | Ht 65.0 in | Wt 245.2 lb

## 2022-10-03 DIAGNOSIS — R002 Palpitations: Secondary | ICD-10-CM | POA: Diagnosis not present

## 2022-10-03 DIAGNOSIS — I10 Essential (primary) hypertension: Secondary | ICD-10-CM | POA: Insufficient documentation

## 2022-10-03 NOTE — Progress Notes (Signed)
HPI Rachel Underwood returns today after an over 6 year absence from our EP clinic. She is a pleasant 51 yo woman, with a h/o palpitations and SVT. She had an ILR placed about 2018 in [place] and presents today for removal. The patient has not had syncope. She denies chest pain or sob.  Allergies  Allergen Reactions   Flecainide Swelling   Piroxicam Anaphylaxis   Abilify [Aripiprazole]    Citalopram Hives   Codeine Nausea And Vomiting   Depakote [Divalproex Sodium] Swelling   Gabapentin     Hives    Haldol [Haloperidol Lactate] Other (See Comments)    Slurs speech and "stumble"   Haloperidol    Indomethacin Swelling    Dizzy, tigling of face   Invega [Paliperidone] Other (See Comments)    Slurred speech, drooling   Latuda [Lurasidone Hcl] Itching   Lithium Hives   Lurasidone    Promethazine Swelling    Arms and hands   Trazodone And Nefazodone Other (See Comments)    Dry mouth, congestion, severe dryness.   Lexapro [Escitalopram Oxalate] Rash     Current Outpatient Medications  Medication Sig Dispense Refill   acyclovir (ZOVIRAX) 400 MG tablet TAKE 1 TABLET BY MOUTH 2 TIMES DAILY AS NEEDED FOR PROPHYLAXIS 180 tablet 3   albuterol (VENTOLIN HFA) 108 (90 Base) MCG/ACT inhaler INHALE 2 PUFFS INTO THE LUNGS EVERY 4 HOURS AS NEEDED FOR WHEEZING OR SHORTNESS OF BREATH. 18 g 2   azelastine (ASTELIN) 0.1 % nasal spray Place 1 spray into both nostrils 2 (two) times daily. 30 mL 12   benzonatate (TESSALON) 100 MG capsule Take 1 capsule (100 mg total) by mouth 2 (two) times daily as needed for cough. 20 capsule 0   bictegravir-emtricitabine-tenofovir AF (BIKTARVY) 50-200-25 MG TABS tablet TAKE 1 TABLET BY MOUTH DAILY. 30 tablet 11   cromolyn (OPTICROM) 4 % ophthalmic solution Place 1 drop into both eyes daily. 10 mL 3   cycloSPORINE (RESTASIS) 0.05 % ophthalmic emulsion Place 1 drop into both eyes 2 (two) times daily. 180 each 2   Ensure (ENSURE) Take 237 mLs by mouth 2 (two)  times daily between meals. 237 mL 6   EPINEPHrine 0.3 mg/0.3 mL IJ SOAJ injection Inject 1 pen (0.3 mg) into the skin daily as needed for anaphylaxis. 1 each 1   fluconazole (DIFLUCAN) 150 MG tablet Take 1 tablet (150 mg total) by mouth daily. Can repeat every 2-3 days for yeast infection 7 tablet 0   hydrocortisone 2.5 % lotion Apply topically.     ibuprofen (ADVIL) 800 MG tablet Take 1 tablet (800 mg total) by mouth every 8 (eight) hours as needed. 30 tablet 0   lamoTRIgine (LAMICTAL) 200 MG tablet TAKE 1 TABLET BY MOUTH ONCE A DAY 30 tablet 3   linaclotide (LINZESS) 290 MCG CAPS capsule Take 1 capsule by mouth daily on empty stomach 30 capsule 2   losartan (COZAAR) 50 MG tablet Take 1 tablet (50 mg total) by mouth daily. 30 tablet 4   montelukast (SINGULAIR) 10 MG tablet Take 1 tablet (10 mg total) by mouth at bedtime. 30 tablet 3   nystatin (MYCOSTATIN/NYSTOP) powder Apply topically 3 times daily for up to 2 weeks for rash under breast. 30 g 1   ondansetron (ZOFRAN-ODT) 4 MG disintegrating tablet Dissolve 1 tablet (4 mg total) by mouth 3 (three) times daily as needed. 20 tablet 3   pantoprazole (PROTONIX) 40 MG tablet Take 1 tablet (40 mg  total) by mouth daily. 30 tablet 3   propranolol (INDERAL) 20 MG tablet Take 1 tablet (20 mg total) by mouth 3 (three) times daily. 90 tablet 5   QUEtiapine (SEROQUEL) 200 MG tablet Take 2 tablets by mouth at bedtime 60 tablet 3   sertraline (ZOLOFT) 100 MG tablet TAKE 2 TABLETS BY MOUTH ONCE DAILY 60 tablet 3   Study - REPRIEVE [phone] - pitavastatin 4 mg or placebo tablet ([place]) Take 1 tablet by mouth daily.  [id number] I [id number] E ACTG [id number] 90 tablet 0   No current facility-administered medications for this visit.     Past Medical History:  Diagnosis Date   Abnormal Pap smear of cervix 2005(EST)   ckc for abnl pap- [facility] [place]   Allergy    Asthma    Bipolar 1 disorder ([place])    Borderline personality disorder ([place])    BPD  (bronchopulmonary dysplasia)    Carpal tunnel syndrome    bilateral wrist   Depression    Dysmenorrhea 06/03/2014   Enlarged uterus 06/03/2014   Fibromyalgia    GERD (gastroesophageal reflux disease)    Herpes simplex without mention of complication    HIV (human immunodeficiency virus infection) ([place])    Hyperlipidemia    Hypertension    Menorrhagia with irregular cycle 06/03/2014   Osteoarthritis    Seizures ([place])    had as a teenager, unknown etiology and no meds.    ROS:   All systems reviewed and negative except as noted in the HPI.   Past Surgical History:  Procedure Laterality Date   ABDOMINAL HYSTERECTOMY N/A 09/01/2014   Procedure: HYSTERECTOMY ABDOMINAL;  Surgeon: Jonnie Kind, MD;  Location: [facility];  Service: Gynecology;  Laterality: N/A;   ANKLE SURGERY Right    repair of ligaments   BACK SURGERY     BILATERAL SALPINGECTOMY Bilateral 09/01/2014   Procedure: BILATERAL SALPINGECTOMY;  Surgeon: Jonnie Kind, MD;  Location: [facility];  Service: Gynecology;  Laterality: Bilateral;   CESAREAN SECTION     ckc     COLONOSCOPY N/A 12/23/2014   QQI:WLNL canal hemorrhoids left-sided diverticula   ESOPHAGOGASTRODUODENOSCOPY N/A 12/23/2014   GXQ:JJHERDE reflux/noncritical shatzkis ring s/p dilation/small HH   EXCISION OF SKIN TAG N/A 09/01/2014   Procedure: EXCISION OF SKIN NEVUS;  Surgeon: Jonnie Kind, MD;  Location: [facility];  Service: Gynecology;  Laterality: N/A;   FRACTURE SURGERY     MALONEY DILATION N/A 12/23/2014   Procedure: Venia Minks DILATION;  Surgeon: Daneil Dolin, MD;  Location: [facility];  Service: Endoscopy;  Laterality: N/A;   SAVORY DILATION N/A 12/23/2014   Procedure: SAVORY DILATION;  Surgeon: Daneil Dolin, MD;  Location: [facility];  Service: Endoscopy;  Laterality: N/A;   SCAR REVISION N/A 09/01/2014   Procedure: EXCISION OF CICATRIX;  Surgeon: Jonnie Kind, MD;  Location: [facility];  Service: Gynecology;  Laterality: N/A;   SHOULDER SURGERY Right     rotator cuff   SPINE SURGERY       Family History  Problem Relation Age of Onset   Fibroids Mother    Hypertension Mother    Depression Mother    Irritable bowel syndrome Mother    Arthritis Mother    Heart disease Mother    Mental illness Mother    Stroke Mother    Vision loss Mother    Lupus Mother    Hypertension Father    Mental illness Father    Arthritis Father  Heart disease Father    Vision loss Father    Heart disease Maternal Grandmother    Cancer Maternal Grandmother        breast   Depression Maternal Grandmother    Hyperlipidemia Maternal Grandmother    Hypertension Maternal Grandmother    Bipolar disorder Daughter    Schizophrenia Daughter    Lupus Daughter    Cancer Paternal Grandfather        prostate   Heart disease Paternal Grandfather    Hyperlipidemia Paternal Grandfather    Hypertension Paternal Grandfather    Dementia Paternal Grandmother    Hyperlipidemia Paternal Grandmother    Hypertension Paternal Grandmother    Heart disease Maternal Grandfather    Alzheimer's disease Maternal Grandfather    Hyperlipidemia Maternal Grandfather    Hypertension Maternal Grandfather    Anxiety disorder Daughter    ADD / ADHD Son    Hypertension Brother    Early death Brother    Learning disabilities Brother    Mental illness Brother    Colon cancer Neg Hx      Social History   Socioeconomic History   Marital status: Divorced    Spouse name: Not on file   Number of children: 3   Years of education: Not on file   Highest education level: Not on file  Occupational History   Not on file  Tobacco Use   Smoking status: Every Day    Types: E-cigarettes   Smokeless tobacco: Never   Tobacco comments:    States she is cutting back  Vaping Use   Vaping Use: Never used  Substance and Sexual Activity   Alcohol use: Not Currently    Comment: Very arely   Drug use: No    Types: "Crack" cocaine    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   Sexual activity: Yes    Partners: Male    Birth control/protection: Condom    Comment: given condoms  Other Topics Concern   Not on file  Social History Narrative   Lives with a friend at the moment    Social Determinants of Health   Financial Resource Strain: Not on file  Food Insecurity: Not on file  Transportation Needs: Not on file  Physical Activity: Inactive (06/01/2022)   Exercise Vital Sign    Days of Exercise per Week: 0 days    Minutes of Exercise per Session: 0 min  Stress: Stress Concern Present (06/01/2022)   [employer]    Feeling of Stress : To some extent  Social Connections: Not on file  Intimate Partner Violence: Not on file     BP 128/78   Pulse 63   Ht '5\' 5"'$  (1.651 m)   Wt 245 lb 3.2 oz (111.2 kg)   LMP 08/11/2014   SpO2 98%   BMI 40.80 kg/m   Physical Exam:  obese appearing NAD HEENT: Unremarkable Neck:  No JVD, no thyromegally Lymphatics:  No adenopathy Back:  No CVA tenderness Lungs:  Clear with no wheezes HEART:  Regular rate rhythm, no murmurs, no rubs, no clicks Abd:  soft, positive bowel sounds, no organomegally, no rebound, no guarding Ext:  2 plus pulses, no edema, no cyanosis, no clubbing Skin:  No rashes no nodules Neuro:  CN II through XII intact, motor grossly intact    DEVICE  Normal device function.  See PaceArt for details. End of service  Assess/Plan:  ILR  -  her device is at end of service and she presents today to have it removed. I have reviewed the indications/risks/benefits/goals/expectations and she wishes to proceed. SVT/PAC's/PVC's - her symptoms are reasonably well controlled.  Obesity - her weight is down 30 lbs since I saw her last over 6 years ago.   EP Procedure Note  Procedure performed: ILR removal  Preoperative diagnosis: Unexplained palpitations   Postoperative diagnosis: same as preoper s/p ILR insertion with the  device at end of service  Description of the procedure: after informed consent was obtained, the patient was prepped and draped in a sterile fashion. 10 cc of lidocaine was infiltrated. A 1.5 cm incision was carried out. A combination of blunt and sharp dissection was used. The ILR was grasped with difficulty. There was extensive scar tissue around the device but eventually the ILR was removed in total. Benzoin and steristrips were painted on the skin. A bandage was applied and the patient recovered in the usual manner.  Complications: none immediately  Conclusion: successful removal of a Biotronik biomarker.   Cristopher Peru, MD

## 2022-10-03 NOTE — Patient Instructions (Addendum)
Medication Instructions:  Your physician recommends that you continue on your current medications as directed. Please refer to the Current Medication list given to you today.  Labwork: None ordered.  Testing/Procedures: None ordered.  Follow-Up:  Your physician wants you to follow-up in: 3-4 Months with DR Lovena Le OR APP.  You will receive a reminder letter in the mail two months in advance. If you don't receive a letter, please call our office to schedule the follow-up appointment.    Implantable Loop BIOTRONIC Recorder Removal, Care After This sheet gives you information about how to care for yourself after your procedure. Your health care provider may also give you more specific instructions. If you have problems or questions, contact your health care provider. What can I expect after the procedure? After the procedure, it is common to have: Soreness or discomfort near the incision. Some swelling or bruising near the incision.  Follow these instructions at home: Incision care  Monitor your cardiac device site for redness, swelling, and drainage. Call the device clinic at [phone] if you experience these symptoms or fever/chills.  Keep the large square bandage on your site for 24 hours and then you may remove it yourself. Keep the steri-strips underneath in place.   You may shower after 72 hours / 3 days from your procedure with the steri-strips in place. They will usually fall off on their own, or may be removed after 10 days. Pat dry.   Avoid lotions, ointments, or perfumes over your incision until it is well-healed.  Please do not submerge in water until your site is completely healed.   If your wound site starts to bleed apply pressure.       If you have any questions/concerns please call the device clinic at [phone].  Activity  Return to your normal activities.  Contact a health care provider if: You have redness, swelling, or pain around your  incision. You have a fever.

## 2022-10-04 ENCOUNTER — Other Ambulatory Visit (HOSPITAL_COMMUNITY): Payer: Self-pay

## 2022-10-11 ENCOUNTER — Other Ambulatory Visit (HOSPITAL_COMMUNITY): Payer: Self-pay

## 2022-10-13 ENCOUNTER — Other Ambulatory Visit (HOSPITAL_COMMUNITY): Payer: Self-pay

## 2022-10-17 ENCOUNTER — Ambulatory Visit (INDEPENDENT_AMBULATORY_CARE_PROVIDER_SITE_OTHER): Payer: Medicaid Other | Admitting: Family Medicine

## 2022-10-17 ENCOUNTER — Encounter: Payer: Self-pay | Admitting: Family Medicine

## 2022-10-17 ENCOUNTER — Other Ambulatory Visit (HOSPITAL_COMMUNITY): Payer: Self-pay

## 2022-10-17 DIAGNOSIS — J988 Other specified respiratory disorders: Secondary | ICD-10-CM | POA: Insufficient documentation

## 2022-10-17 MED ORDER — BENZONATATE 100 MG PO CAPS
100.0000 mg | ORAL_CAPSULE | Freq: Two times a day (BID) | ORAL | 0 refills | Status: DC | PRN
  Filled 2022-10-17: qty 20, 10d supply, fill #0

## 2022-10-17 NOTE — Patient Instructions (Addendum)
Follow-up as before call if you need to be seen sooner.    Patient is to come in today for flu and COVID testing.  Tessalon Perles are refilled.  Please ensure you get adequate rest and hydration.  If you have difficulty breathing with shortness of breath please go directly to the emergency room.

## 2022-10-17 NOTE — Assessment & Plan Note (Addendum)
3 day h/o acute symptoms,  believes that it is covid , has had twice before, not taken paxlovid , but will consider.come today for testing for flu and covid Supportive care at home and Ed if breathing difficulty worsens Immunocompromised , therefore at high risk for complications

## 2022-10-17 NOTE — Progress Notes (Signed)
Virtual Visit via Video Note  I connected with Rachel Underwood on 10/17/22 at  9:00 AM EST by a video enabled telemedicine application and verified that I am speaking with the correct person using two identifiers.  Location: Patient: home Provider: office   I discussed the limitations of evaluation and management by telemedicine and the availability of in person appointments. The patient expressed understanding and agreed to proceed.  History of Present Illness: 3day h/o cough congestion, chills, body aches, positive covid exposure with close contact, 3 prior episodes of Covid, has never taken paxlovid.  and c/o loose stool, 2 loose stool,today, more watery yesterday, had 3 yesterday, C/o fatigue, difficulty breathin and  with shortness of breath She is immunocompromised and therefore at increased risk for complications   Observations/Objective: Good communication with no confusion and intact memory. Alert , coughing during visit and short of brath,but able to speak in sentences Assessment and Plan:  Respiratory infection 3 day h/o acute symptoms,  believes that it is covid , has had twice before, not taken paxlovid , but will consider.come today for testing for flu and covid Supportive care at home and Ed if breathing difficulty worsens Immunocompromised , therefore at high risk for complications   Follow Up Instructions:    I discussed the assessment and treatment plan with the patient. The patient was provided an opportunity to ask questions and all were answered. The patient agreed with the plan and demonstrated an understanding of the instructions.   The patient was advised to call back or seek an in-person evaluation if the symptoms worsen or if the condition fails to improve as anticipated.  I provided 9 minutes of non-face-to-face time during this encounter.   Tula Nakayama, MD

## 2022-10-20 ENCOUNTER — Other Ambulatory Visit (HOSPITAL_COMMUNITY): Payer: Self-pay

## 2022-10-23 ENCOUNTER — Other Ambulatory Visit: Payer: Self-pay | Admitting: Nurse Practitioner

## 2022-10-23 ENCOUNTER — Encounter (HOSPITAL_COMMUNITY): Payer: Self-pay | Admitting: Emergency Medicine

## 2022-10-23 ENCOUNTER — Other Ambulatory Visit: Payer: Self-pay

## 2022-10-23 ENCOUNTER — Emergency Department (HOSPITAL_COMMUNITY)
Admission: EM | Admit: 2022-10-23 | Discharge: 2022-10-23 | Disposition: A | Discharge status: Home / Self Care | Payer: Medicaid Other | Attending: Emergency Medicine | Admitting: Emergency Medicine

## 2022-10-23 ENCOUNTER — Emergency Department (HOSPITAL_COMMUNITY): Payer: Medicaid Other

## 2022-10-23 ENCOUNTER — Other Ambulatory Visit (HOSPITAL_COMMUNITY): Payer: Self-pay

## 2022-10-23 DIAGNOSIS — R059 Cough, unspecified: Secondary | ICD-10-CM | POA: Diagnosis not present

## 2022-10-23 DIAGNOSIS — Z21 Asymptomatic human immunodeficiency virus [HIV] infection status: Secondary | ICD-10-CM | POA: Diagnosis not present

## 2022-10-23 DIAGNOSIS — Z79899 Other long term (current) drug therapy: Secondary | ICD-10-CM | POA: Insufficient documentation

## 2022-10-23 DIAGNOSIS — R197 Diarrhea, unspecified: Secondary | ICD-10-CM | POA: Diagnosis not present

## 2022-10-23 DIAGNOSIS — R0602 Shortness of breath: Secondary | ICD-10-CM | POA: Diagnosis present

## 2022-10-23 DIAGNOSIS — F3170 Bipolar disorder, currently in remission, most recent episode unspecified: Secondary | ICD-10-CM

## 2022-10-23 DIAGNOSIS — J45909 Unspecified asthma, uncomplicated: Secondary | ICD-10-CM | POA: Diagnosis not present

## 2022-10-23 DIAGNOSIS — J301 Allergic rhinitis due to pollen: Secondary | ICD-10-CM

## 2022-10-23 DIAGNOSIS — J40 Bronchitis, not specified as acute or chronic: Secondary | ICD-10-CM

## 2022-10-23 DIAGNOSIS — Z20822 Contact with and (suspected) exposure to covid-19: Secondary | ICD-10-CM | POA: Diagnosis not present

## 2022-10-23 DIAGNOSIS — J4520 Mild intermittent asthma, uncomplicated: Secondary | ICD-10-CM

## 2022-10-23 DIAGNOSIS — R739 Hyperglycemia, unspecified: Secondary | ICD-10-CM | POA: Diagnosis not present

## 2022-10-23 LAB — COMPREHENSIVE METABOLIC PANEL
ALT: 15 U/L (ref 0–44)
AST: 15 U/L (ref 15–41)
Albumin: 4.1 g/dL (ref 3.5–5.0)
Alkaline Phosphatase: 54 U/L (ref 38–126)
Anion gap: 5 (ref 5–15)
BUN: 13 mg/dL (ref 6–20)
CO2: 27 mmol/L (ref 22–32)
Calcium: 9.5 mg/dL (ref 8.9–10.3)
Chloride: 106 mmol/L (ref 98–111)
Creatinine, Ser: 0.68 mg/dL (ref 0.44–1.00)
GFR, Estimated: >60 mL/min (ref >60)
Glucose, Bld: 102 mg/dL — ABNORMAL HIGH (ref 70–99)
Potassium: 4.3 mmol/L (ref 3.5–5.1)
Sodium: 138 mmol/L (ref 135–145)
Total Bilirubin: 0.4 mg/dL (ref 0.3–1.2)
Total Protein: 7.7 g/dL (ref 6.5–8.1)

## 2022-10-23 LAB — CBC
HCT: 42.3 % (ref 36.0–46.0)
Hemoglobin: 14.3 g/dL (ref 12.0–15.0)
MCH: 32.1 pg (ref 26.0–34.0)
MCHC: 33.8 g/dL (ref 30.0–36.0)
MCV: 94.8 fL (ref 80.0–100.0)
Platelets: 229 10*3/uL (ref 150–400)
RBC: 4.46 MIL/uL (ref 3.87–5.11)
RDW: 13 % (ref 11.5–15.5)
WBC: 8.1 10*3/uL (ref 4.0–10.5)
nRBC: 0 % (ref 0.0–0.2)

## 2022-10-23 LAB — RESP PANEL BY RT-PCR (FLU A&B, COVID) ARPGX2
Influenza A by PCR: NEGATIVE
Influenza B by PCR: NEGATIVE
SARS Coronavirus 2 by RT PCR: NEGATIVE

## 2022-10-23 LAB — D-DIMER, QUANTITATIVE: D-Dimer, Quant: <0.27 ug/mL-FEU (ref 0.00–0.50)

## 2022-10-23 MED ORDER — PREDNISONE 10 MG PO TABS: 40.0000 mg | ORAL_TABLET | Freq: Every day | ORAL | 0 refills | Status: DC

## 2022-10-23 NOTE — ED Triage Notes (Signed)
Cough/sob x 3 -4 weeks. Diarrhea x 4 days, 3 in last 24 hours. N/v x 2 in last 24 hours. Chills and palpitations "in my heart". Nad. Mm moist. Non diaphoretic. Dry cough noted.

## 2022-10-23 NOTE — Discharge Instructions (Signed)
You have been seen today for your complaint of cough. Your lab work was reassuring and showed no abnormalities. Your imaging was reassuring and showed no abnormalities. Your discharge medications include prednisone.  This is a steroid.  You should take it as prescribed.  You should take it with food. Home care instructions are as follows:  Drink plenty of fluids.  Try to rest.  Try to avoid cold weather.  Stop smoking. Follow up with: Your primary care provider and cardiologist Please seek immediate medical care if you develop any of the following symptoms: You cough up blood. You have chest pain. You have very bad shortness of breath. You faint or keep feeling like you are going to faint. You have a very bad headache. Your fever or chills get worse. At this time there does not appear to be the presence of an emergent medical condition, however there is always the potential for conditions to change. Please read and follow the below instructions.  Do not take your medicine if  develop an itchy rash, swelling in your mouth or lips, or difficulty breathing; call 911 and seek immediate emergency medical attention if this occurs.  You may review your lab tests and imaging results in their entirety on your MyChart account.  Please discuss all results of fully with your primary care provider and other specialist at your follow-up visit.  Note: Portions of this text may have been transcribed using voice recognition software. Every effort was made to ensure accuracy; however, inadvertent computerized transcription errors may still be present.

## 2022-10-23 NOTE — ED Provider Notes (Signed)
[facility] EMERGENCY DEPARTMENT Provider Note   CSN: [id number] Arrival date & time: 10/23/22  4970     History  Chief Complaint  Patient presents with   Cough   Shortness of Breath   Diarrhea    Rachel Underwood is a 51 y.o. female.  With history of HIV with last CD4 count of 846, lupus, depression, fibromyalgia, borderline personality disorder, bipolar 1, asthma, seizures, bronchopulmonary dysplasia, multiple recent upper respiratory infections who presents to the ED for evaluation of a cough.  She states she has had this cough for the past 3 weeks.  She states for 2 months prior to this she had a cough and shortness of breath, and was treated with 2 rounds of antibiotics and Tessalon Perles without full resolution.  She states cough is persistent and she occasionally coughs up yellow mucus.  States that she coughs so much that her chest has become sore, but denies specific deeper chest pain.  Has a history of asthma and states that her albuterol inhaler has helped with her symptoms, however she ran out yesterday.  Also reports chills, body aches and intermittent diarrhea.  States she has been exposed to someone who tested positive for COVID recently.  Denies dizziness, lightheadedness, fever, unilateral leg swelling, history of DVT, recent surgery, cancer treatment, hemoptysis, nausea, vomiting. Patient also reports recent high blood pressure readings. Was seen by cardiology and had a loop recorder recently which was normal. Has another cardiology appointment in the future.    Cough Associated symptoms: shortness of breath   Shortness of Breath Associated symptoms: cough   Diarrhea      Home Medications Prior to Admission medications   Medication Sig Start Date End Date Taking? Authorizing Provider  acyclovir (ZOVIRAX) 400 MG tablet TAKE 1 TABLET BY MOUTH 2 TIMES DAILY AS NEEDED FOR PROPHYLAXIS 01/24/22 01/24/23  Carlyle Basques, MD  albuterol (VENTOLIN HFA) 108 (90 Base) MCG/ACT  inhaler INHALE 2 PUFFS INTO THE LUNGS EVERY 4 HOURS AS NEEDED FOR WHEEZING OR SHORTNESS OF BREATH. 08/16/22 08/16/23  Paseda, Dewaine Conger, FNP  azelastine (ASTELIN) 0.1 % nasal spray Place 1 spray into both nostrils 2 (two) times daily. 10/12/20   Janora Norlander, DO  benzonatate (TESSALON) 100 MG capsule Take 1 capsule (100 mg total) by mouth 2 (two) times daily as needed for cough. 10/17/22   Fayrene Helper, MD  bictegravir-emtricitabine-tenofovir AF (BIKTARVY) 50-200-25 MG TABS tablet TAKE 1 TABLET BY MOUTH DAILY. 08/15/22 08/15/23  Carlyle Basques, MD  cromolyn (OPTICROM) 4 % ophthalmic solution Place 1 drop into both eyes daily. 05/18/22   Paseda, Dewaine Conger, FNP  cycloSPORINE (RESTASIS) 0.05 % ophthalmic emulsion Place 1 drop into both eyes 2 (two) times daily. 05/18/22   Paseda, Dewaine Conger, FNP  Ensure (ENSURE) Take 237 mLs by mouth 2 (two) times daily between meals. 06/19/22   Carlyle Basques, MD  EPINEPHrine 0.3 mg/0.3 mL IJ SOAJ injection Inject 1 pen (0.3 mg) into the skin daily as needed for anaphylaxis. 05/18/22   Renee Rival, FNP  fluconazole (DIFLUCAN) 150 MG tablet Take 1 tablet (150 mg total) by mouth daily. Can repeat every 2-3 days for yeast infection 08/15/22   Carlyle Basques, MD  hydrocortisone 2.5 % lotion Apply topically.    [provider]  ibuprofen (ADVIL) 800 MG tablet Take 1 tablet (800 mg total) by mouth every 8 (eight) hours as needed. 08/17/22   Carlyle Basques, MD  lamoTRIgine (LAMICTAL) 200 MG tablet TAKE 1 TABLET BY MOUTH  ONCE A DAY 08/16/22 08/16/23  Renee Rival, FNP  linaclotide (LINZESS) 290 MCG CAPS capsule Take 1 capsule by mouth daily on empty stomach 05/18/22   Paseda, Dewaine Conger, FNP  losartan (COZAAR) 50 MG tablet Take 1 tablet (50 mg total) by mouth daily. 02/21/22 02/21/23  Fransico Meadow, PA-C  montelukast (SINGULAIR) 10 MG tablet Take 1 tablet (10 mg total) by mouth at bedtime. 08/18/22   Renee Rival, FNP  nystatin  (MYCOSTATIN/NYSTOP) powder Apply topically 3 times daily for up to 2 weeks for rash under breast. 01/24/22   Carlyle Basques, MD  ondansetron (ZOFRAN-ODT) 4 MG disintegrating tablet Dissolve 1 tablet (4 mg total) by mouth 3 (three) times daily as needed. 06/13/22   Carlyle Basques, MD  pantoprazole (PROTONIX) 40 MG tablet Take 1 tablet (40 mg total) by mouth daily. 06/13/22   Carlyle Basques, MD  propranolol (INDERAL) 20 MG tablet Take 1 tablet (20 mg total) by mouth 3 (three) times daily. 06/13/22   Carlyle Basques, MD  QUEtiapine (SEROQUEL) 200 MG tablet Take 2 tablets by mouth at bedtime 07/19/22     sertraline (ZOLOFT) 100 MG tablet TAKE 2 TABLETS BY MOUTH ONCE DAILY 05/18/22 05/18/23  Renee Rival, FNP  Study - REPRIEVE [phone] - pitavastatin 4 mg or placebo tablet ([place]) Take 1 tablet by mouth daily.  [id number] I [id number] E ACTG U1324 03/10/21   Robert Bellow, PA-C      Allergies    Flecainide, Piroxicam, Abilify [aripiprazole], Citalopram, Codeine, Depakote [divalproex sodium], Gabapentin, Haldol [haloperidol lactate], Haloperidol, Indomethacin, Invega [paliperidone], Latuda [lurasidone hcl], Lithium, Lurasidone, Promethazine, Trazodone and nefazodone, and Lexapro [escitalopram oxalate]    Review of Systems   Review of Systems  Respiratory:  Positive for cough, chest tightness and shortness of breath.   Gastrointestinal:  Positive for diarrhea.  All other systems reviewed and are negative.   Physical Exam Updated Vital Signs BP (!) 152/104 (BP Location: Left Arm) Comment: has not taken bp meds today  Pulse 65   Temp 98.6 F (37 C) (Oral)   Resp 20   LMP 08/11/2014   SpO2 100%  Physical Exam Vitals and nursing note reviewed.  Constitutional:      General: She is not in acute distress.    Appearance: She is well-developed. She is obese. She is not ill-appearing, toxic-appearing or diaphoretic.  HENT:     Head: Normocephalic and atraumatic.     Nose: Congestion and  rhinorrhea present.     Mouth/Throat:     Mouth: Mucous membranes are moist.     Pharynx: Oropharynx is clear. No pharyngeal swelling or oropharyngeal exudate.  Eyes:     Extraocular Movements: Extraocular movements intact.     Conjunctiva/sclera: Conjunctivae normal.     Pupils: Pupils are equal, round, and reactive to light.  Cardiovascular:     Rate and Rhythm: Normal rate and regular rhythm.     Heart sounds: No murmur heard. Pulmonary:     Effort: Pulmonary effort is normal. No tachypnea, accessory muscle usage or respiratory distress.     Breath sounds: Normal breath sounds. No stridor. No decreased breath sounds, wheezing, rhonchi or rales.  Chest:     Chest wall: Tenderness present.  Abdominal:     Palpations: Abdomen is soft.     Tenderness: There is no abdominal tenderness. There is no guarding or rebound.  Musculoskeletal:        General: No swelling.     Cervical back: Normal range of  motion and neck supple.     Right lower leg: No edema.     Left lower leg: No edema.  Skin:    General: Skin is warm and dry.     Capillary Refill: Capillary refill takes less than 2 seconds.  Neurological:     General: No focal deficit present.     Mental Status: She is alert and oriented to person, place, and time.  Psychiatric:        Mood and Affect: Mood normal.     ED Results / Procedures / Treatments   Labs (all labs ordered are listed, but only abnormal results are displayed) Labs Reviewed  COMPREHENSIVE METABOLIC PANEL - Abnormal; Notable for the following components:      Result Value   Glucose, Bld 102 (*)    All other components within normal limits  RESP PANEL BY RT-PCR (FLU A&B, COVID) ARPGX2  CBC  D-DIMER, QUANTITATIVE  URINALYSIS, ROUTINE W REFLEX MICROSCOPIC    EKG None  Radiology DG Chest 2 View  Result Date: 10/23/2022 CLINICAL DATA:  Cough and shortness of breath. EXAM: CHEST - 2 VIEW COMPARISON:  11/10/2020 FINDINGS: The heart size and mediastinal  contours are within normal limits. Both lungs are clear. The visualized skeletal structures are unremarkable. IMPRESSION: No active cardiopulmonary disease. Electronically Signed   By: Misty Stanley M.D.   On: 10/23/2022 10:59    Procedures Procedures    Medications Ordered in ED Medications - No data to display  ED Course/ Medical Decision Making/ A&P                           Medical Decision Making Amount and/or Complexity of Data Reviewed Labs: ordered. Radiology: ordered.  This patient presents to the ED for concern of cough, this involves an extensive number of treatment options, and is a complaint that carries with it a high risk of complications and morbidity.  Differential diagnosis for emergent cause of cough includes but is not limited to upper respiratory infection, lower respiratory infection, allergies, asthma, irritants, foreign body, medications such as ACE inhibitors, reflux, asthma, CHF, lung cancer, interstitial lung disease, psychiatric causes, postnasal drip and postinfectious bronchospasm.    Co morbidities that complicate the patient evaluation  HIV with last CD4 count of 846, lupus, depression, fibromyalgia, borderline personality disorder, bipolar 1, asthma, seizures, bronchopulmonary dysplasia, multiple recent upper respiratory infections  My initial workup includes basic labs, respiratory panel, chest x ray, D dimer, EKG   Additional history obtained from: Nursing notes from this visit. Previous records within EMR system office visit on 10/17/2022 for same.  Tessalon Perles prescription was renewed  I ordered, reviewed and interpreted labs which include: CMP, CBC, D-dimer, respiratory panel.  Slight hyperglycemia of 102.  All other labs normal.   I ordered imaging studies including chest x-ray I independently visualized and interpreted imaging which showed normal I agree with the radiologist interpretation  Cardiac Monitoring:  The patient was  maintained on a cardiac monitor.  I personally viewed and interpreted the cardiac monitored which showed an underlying rhythm of: NSR  Afebrile, hemodynamically stable.  51 year old female with an extensive past medical history presenting to the ED for evaluation of a subacute cough.  Cough has been intermittent for the past 3 months.  Has improved with antibiotics in the past, but recently returned.  Has not tried steroids as she is afraid that it would interact with her HIV medications.  Physical exam  unremarkable including no adventitious breath sounds.  Lab work unremarkable.  Chest x-ray normal.  EKG normal.  Most likely presentation is acute on chronic bronchitis.  Patient is a chronic smoker and often has multiple months of coughing each year.  We will treat patient with prednisone burst.  Patient does have a refill of her albuterol and will call this in.  Gave patient strict return precautions.  Stable at discharge.  At this time there does not appear to be any evidence of an acute emergency medical condition and the patient appears stable for discharge with appropriate outpatient follow up. Diagnosis was discussed with patient who verbalizes understanding of care plan and is agreeable to discharge. I have discussed return precautions with patient who verbalizes understanding. Patient encouraged to follow-up with their PCP within 1 week. All questions answered.  Patient's case discussed with Dr. Doren Custard who agrees with plan to discharge with follow-up.   Note: Portions of this report may have been transcribed using voice recognition software. Every effort was made to ensure accuracy; however, inadvertent computerized transcription errors may still be present.          Final Clinical Impression(s) / ED Diagnoses Final diagnoses:  None    Rx / DC Orders ED Discharge Orders     None         Roylene Reason, Hershal Coria 10/23/22 1445    Godfrey Pick, MD 10/24/22 [phone]

## 2022-10-23 NOTE — ED Notes (Signed)
Patient verbalizes understanding of discharge instructions. Opportunity for questioning and answers were provided. Armband removed by staff, pt discharged from ED. Ambulated out to lobby  

## 2022-10-24 ENCOUNTER — Other Ambulatory Visit (HOSPITAL_COMMUNITY): Payer: Self-pay

## 2022-10-24 ENCOUNTER — Telehealth: Payer: Self-pay

## 2022-10-24 MED ORDER — SERTRALINE HCL 100 MG PO TABS
200.0000 mg | ORAL_TABLET | Freq: Every day | ORAL | 3 refills | Status: DC
  Filled 2022-10-24: qty 60, 30d supply, fill #0
  Filled 2022-11-15: qty 60, 30d supply, fill #1
  Filled 2022-12-28: qty 60, 30d supply, fill #2
  Filled 2023-01-29: qty 60, 30d supply, fill #3

## 2022-10-24 NOTE — Telephone Encounter (Signed)
Kindly refill

## 2022-10-24 NOTE — Telephone Encounter (Signed)
Transition Care Management Follow-up Telephone Call Date of discharge and from where: [facility] 10/23/2022 How have you been since you were released from the hospital? weak Any questions or concerns? No  Items Reviewed: Did the pt receive and understand the discharge instructions provided? Yes  Medications obtained and verified? Yes  Other? No  Any new allergies since your discharge? No  Dietary orders reviewed? Yes Do you have support at home? Yes   Home Care and Equipment/Supplies: Were home health services ordered? no If so, what is the name of the agency? N/a  Has the agency set up a time to come to the patient's home? not applicable Were any new equipment or medical supplies ordered?  No What is the name of the medical supply agency? N/a Were you able to get the supplies/equipment? not applicable Do you have any questions related to the use of the equipment or supplies? No  Functional Questionnaire: (I = Independent and D = Dependent) ADLs: I  Bathing/Dressing- I  Meal Prep- I  Eating- I  Maintaining continence- I  Transferring/Ambulation- I  Managing Meds- I  Follow up appointments reviewed:  PCP Hospital f/u appt confirmed? No   no available appts within 7-14 day window. Will sent message to staff to consult PCP [facility] f/u appt confirmed? No   Are transportation arrangements needed? No  If their condition worsens, is the pt aware to call PCP or go to the Emergency Dept.? Yes Was the patient provided with contact information for the PCP's office or ED? Yes Was to pt encouraged to call back with questions or concerns? Yes .lh

## 2022-10-25 ENCOUNTER — Other Ambulatory Visit (HOSPITAL_COMMUNITY): Payer: Self-pay

## 2022-10-25 MED ORDER — QUETIAPINE FUMARATE 200 MG PO TABS
400.0000 mg | ORAL_TABLET | Freq: Every day | ORAL | 3 refills | Status: DC
  Filled 2022-10-25 – 2022-11-15 (×2): qty 60, 30d supply, fill #0
  Filled 2022-12-05: qty 60, 30d supply, fill #1
  Filled 2022-12-17 – 2023-01-20 (×2): qty 60, 30d supply, fill #2

## 2022-10-31 ENCOUNTER — Other Ambulatory Visit (HOSPITAL_COMMUNITY): Payer: Self-pay

## 2022-11-03 ENCOUNTER — Other Ambulatory Visit (HOSPITAL_COMMUNITY): Payer: Self-pay

## 2022-11-15 ENCOUNTER — Other Ambulatory Visit: Payer: Self-pay | Admitting: Nurse Practitioner

## 2022-11-15 ENCOUNTER — Other Ambulatory Visit: Payer: Self-pay

## 2022-11-15 ENCOUNTER — Other Ambulatory Visit: Payer: Self-pay | Admitting: Internal Medicine

## 2022-11-15 ENCOUNTER — Other Ambulatory Visit (HOSPITAL_COMMUNITY): Payer: Self-pay

## 2022-11-15 DIAGNOSIS — L304 Erythema intertrigo: Secondary | ICD-10-CM

## 2022-11-15 DIAGNOSIS — F3170 Bipolar disorder, currently in remission, most recent episode unspecified: Secondary | ICD-10-CM

## 2022-11-16 ENCOUNTER — Other Ambulatory Visit (HOSPITAL_COMMUNITY): Payer: Self-pay

## 2022-11-21 ENCOUNTER — Other Ambulatory Visit: Payer: Self-pay

## 2022-11-22 ENCOUNTER — Other Ambulatory Visit: Payer: Self-pay

## 2022-11-22 MED ORDER — IBUPROFEN 800 MG PO TABS
800.0000 mg | ORAL_TABLET | Freq: Three times a day (TID) | ORAL | 0 refills | Status: DC | PRN
  Filled 2022-11-22: qty 30, 10d supply, fill #0

## 2022-11-22 MED ORDER — ONDANSETRON 4 MG PO TBDP
4.0000 mg | ORAL_TABLET | Freq: Three times a day (TID) | ORAL | 3 refills | Status: DC | PRN
  Filled 2022-11-22: qty 20, 7d supply, fill #0
  Filled 2022-12-01: qty 20, 7d supply, fill #1
  Filled 2022-12-17 – 2022-12-28 (×2): qty 20, 7d supply, fill #2
  Filled 2023-01-05: qty 20, 7d supply, fill #3

## 2022-11-22 MED ORDER — NYSTATIN 100000 UNIT/GM EX POWD
Freq: Three times a day (TID) | CUTANEOUS | 1 refills | Status: DC
  Filled 2022-11-22: qty 30, 14d supply, fill #0
  Filled 2022-12-01 – 2022-12-17 (×2): qty 30, 14d supply, fill #1

## 2022-11-22 MED ORDER — PANTOPRAZOLE SODIUM 40 MG PO TBEC
40.0000 mg | DELAYED_RELEASE_TABLET | Freq: Every day | ORAL | 3 refills | Status: DC
  Filled 2022-11-22: qty 30, 30d supply, fill #0
  Filled 2022-12-17: qty 30, 30d supply, fill #1

## 2022-11-22 NOTE — Telephone Encounter (Signed)
kindly refill if needed

## 2022-11-23 ENCOUNTER — Other Ambulatory Visit: Payer: Self-pay

## 2022-11-30 ENCOUNTER — Ambulatory Visit: Payer: Medicaid Other | Admitting: Internal Medicine

## 2022-12-01 ENCOUNTER — Other Ambulatory Visit: Payer: Self-pay | Admitting: Nurse Practitioner

## 2022-12-01 ENCOUNTER — Other Ambulatory Visit (HOSPITAL_COMMUNITY): Payer: Self-pay

## 2022-12-01 DIAGNOSIS — J4520 Mild intermittent asthma, uncomplicated: Secondary | ICD-10-CM

## 2022-12-01 DIAGNOSIS — J301 Allergic rhinitis due to pollen: Secondary | ICD-10-CM

## 2022-12-05 ENCOUNTER — Other Ambulatory Visit (HOSPITAL_COMMUNITY): Payer: Self-pay

## 2022-12-06 ENCOUNTER — Other Ambulatory Visit: Payer: Self-pay

## 2022-12-06 ENCOUNTER — Other Ambulatory Visit (HOSPITAL_COMMUNITY): Payer: Self-pay

## 2022-12-07 ENCOUNTER — Other Ambulatory Visit (HOSPITAL_COMMUNITY): Payer: Self-pay

## 2022-12-07 ENCOUNTER — Encounter (HOSPITAL_COMMUNITY): Payer: Self-pay

## 2022-12-11 ENCOUNTER — Other Ambulatory Visit: Payer: Self-pay

## 2022-12-11 ENCOUNTER — Other Ambulatory Visit: Payer: Self-pay | Admitting: Family Medicine

## 2022-12-11 DIAGNOSIS — F3170 Bipolar disorder, currently in remission, most recent episode unspecified: Secondary | ICD-10-CM

## 2022-12-11 MED ORDER — LAMOTRIGINE 200 MG PO TABS
200.0000 mg | ORAL_TABLET | Freq: Every day | ORAL | 3 refills | Status: DC
  Filled 2022-12-11: qty 30, 30d supply, fill #0
  Filled 2022-12-17 – 2023-01-20 (×2): qty 30, 30d supply, fill #1
  Filled 2023-03-05: qty 30, 30d supply, fill #2
  Filled 2023-04-12: qty 30, 30d supply, fill #3

## 2022-12-11 NOTE — Telephone Encounter (Signed)
Rx sent 

## 2022-12-17 ENCOUNTER — Other Ambulatory Visit: Payer: Self-pay | Admitting: Internal Medicine

## 2022-12-17 ENCOUNTER — Other Ambulatory Visit: Payer: Self-pay | Admitting: Family Medicine

## 2022-12-18 ENCOUNTER — Other Ambulatory Visit: Payer: Self-pay

## 2022-12-18 ENCOUNTER — Other Ambulatory Visit (HOSPITAL_COMMUNITY): Payer: Self-pay

## 2022-12-18 MED ORDER — IBUPROFEN 800 MG PO TABS
800.0000 mg | ORAL_TABLET | Freq: Three times a day (TID) | ORAL | 0 refills | Status: DC | PRN
  Filled 2022-12-18 – 2023-01-05 (×2): qty 30, 10d supply, fill #0

## 2022-12-19 ENCOUNTER — Other Ambulatory Visit: Payer: Self-pay

## 2022-12-19 ENCOUNTER — Ambulatory Visit: Payer: Medicaid Other | Admitting: Family Medicine

## 2022-12-19 ENCOUNTER — Other Ambulatory Visit (HOSPITAL_COMMUNITY): Payer: Self-pay

## 2022-12-20 ENCOUNTER — Other Ambulatory Visit: Payer: Self-pay

## 2022-12-22 ENCOUNTER — Other Ambulatory Visit: Payer: Self-pay

## 2022-12-27 ENCOUNTER — Other Ambulatory Visit (HOSPITAL_COMMUNITY): Payer: Self-pay

## 2022-12-28 ENCOUNTER — Other Ambulatory Visit (HOSPITAL_COMMUNITY): Payer: Self-pay

## 2022-12-28 ENCOUNTER — Other Ambulatory Visit: Payer: Self-pay

## 2023-01-01 ENCOUNTER — Other Ambulatory Visit: Payer: Self-pay

## 2023-01-01 ENCOUNTER — Other Ambulatory Visit (HOSPITAL_COMMUNITY): Payer: Self-pay

## 2023-01-01 ENCOUNTER — Encounter: Payer: Self-pay | Admitting: Internal Medicine

## 2023-01-01 ENCOUNTER — Other Ambulatory Visit (HOSPITAL_COMMUNITY)
Admission: RE | Admit: 2023-01-01 | Discharge: 2023-01-01 | Disposition: A | Discharge status: Home / Self Care | Payer: Medicaid Other | Source: Ambulatory Visit | Attending: Internal Medicine | Admitting: Internal Medicine

## 2023-01-01 ENCOUNTER — Encounter: Payer: Self-pay | Admitting: Gastroenterology

## 2023-01-01 ENCOUNTER — Ambulatory Visit (INDEPENDENT_AMBULATORY_CARE_PROVIDER_SITE_OTHER): Payer: Medicaid Other | Admitting: Internal Medicine

## 2023-01-01 VITALS — BP 114/79 | HR 74 | Temp 97.6°F | Wt 234.0 lb

## 2023-01-01 DIAGNOSIS — B20 Human immunodeficiency virus [HIV] disease: Secondary | ICD-10-CM | POA: Diagnosis present

## 2023-01-01 DIAGNOSIS — Z8719 Personal history of other diseases of the digestive system: Secondary | ICD-10-CM | POA: Diagnosis not present

## 2023-01-01 DIAGNOSIS — N898 Other specified noninflammatory disorders of vagina: Secondary | ICD-10-CM | POA: Diagnosis not present

## 2023-01-01 DIAGNOSIS — L304 Erythema intertrigo: Secondary | ICD-10-CM | POA: Diagnosis not present

## 2023-01-01 DIAGNOSIS — Z23 Encounter for immunization: Secondary | ICD-10-CM | POA: Diagnosis not present

## 2023-01-01 MED ORDER — ROSUVASTATIN CALCIUM 10 MG PO TABS
10.0000 mg | ORAL_TABLET | Freq: Every day | ORAL | 11 refills | Status: DC
  Filled 2023-01-01: qty 30, 30d supply, fill #0
  Filled 2023-01-20 – 2023-01-29 (×2): qty 30, 30d supply, fill #1
  Filled 2023-03-05: qty 30, 30d supply, fill #2
  Filled 2023-04-02 – 2023-04-12 (×2): qty 30, 30d supply, fill #3
  Filled 2023-05-16: qty 30, 30d supply, fill #4
  Filled 2023-06-15: qty 30, 30d supply, fill #5

## 2023-01-01 MED ORDER — CYCLOSPORINE 0.05 % OP EMUL
1.0000 [drp] | Freq: Two times a day (BID) | OPHTHALMIC | 2 refills | Status: AC
  Filled 2023-01-01: qty 180, 90d supply, fill #0
  Filled 2023-01-05 – 2023-06-15 (×2): qty 180, 90d supply, fill #1

## 2023-01-01 MED ORDER — NYSTATIN 100000 UNIT/GM EX POWD
Freq: Three times a day (TID) | CUTANEOUS | 1 refills | Status: DC
  Filled 2023-01-01 – 2023-01-20 (×2): qty 30, 14d supply, fill #0
  Filled 2023-04-02 – 2023-06-15 (×2): qty 30, 14d supply, fill #1

## 2023-01-01 MED ORDER — ACYCLOVIR 400 MG PO TABS
400.0000 mg | ORAL_TABLET | Freq: Two times a day (BID) | ORAL | 3 refills | Status: DC | PRN
  Filled 2023-01-01: qty 180, fill #0
  Filled 2023-03-05: qty 180, 90d supply, fill #0
  Filled 2023-05-16 – 2023-06-05 (×2): qty 180, 90d supply, fill #1

## 2023-01-01 MED ORDER — FLUCONAZOLE 150 MG PO TABS
150.0000 mg | ORAL_TABLET | Freq: Every day | ORAL | 0 refills | Status: DC
  Filled 2023-01-01: qty 4, 12d supply, fill #0

## 2023-01-01 MED ORDER — BICTEGRAVIR-EMTRICITAB-TENOFOV 50-200-25 MG PO TABS
1.0000 | ORAL_TABLET | Freq: Every day | ORAL | 11 refills | Status: AC
  Filled 2023-01-01: qty 30, 30d supply, fill #0
  Filled 2023-02-12: qty 30, 30d supply, fill #1
  Filled 2023-03-14 – 2023-04-03 (×2): qty 30, 30d supply, fill #2
  Filled 2023-04-26: qty 30, 30d supply, fill #3
  Filled 2023-06-05: qty 30, 30d supply, fill #4
  Filled 2023-07-16: qty 30, 30d supply, fill #5
  Filled 2023-08-17 (×2): qty 30, 30d supply, fill #6
  Filled 2023-09-12: qty 30, 30d supply, fill #7
  Filled 2023-10-16: qty 30, 30d supply, fill #8

## 2023-01-01 MED ORDER — PROPRANOLOL HCL 20 MG PO TABS
20.0000 mg | ORAL_TABLET | Freq: Two times a day (BID) | ORAL | 6 refills | Status: DC
  Filled 2023-01-01: qty 60, 30d supply, fill #0
  Filled 2023-01-05 – 2023-01-29 (×3): qty 60, 30d supply, fill #1
  Filled 2023-03-14 – 2023-05-16 (×3): qty 60, 30d supply, fill #2
  Filled 2023-06-15: qty 60, 30d supply, fill #3

## 2023-01-01 NOTE — Progress Notes (Unsigned)
Patient ID: Rachel Underwood, female   DOB: June 01, 1971, 52 y.o.   MRN: [id number]  HPI [name] is a 52yo F with well controlled hiv disease, relocating back into the area. She reports no longer caring for her grandkids, but now living on her own. She has Found a room to rent in [place]; on medicaid Considering for getting involved in research trial. Finished being on reprieve trial. She was in the placebo arm. She reports food access issues. Would like ensure   Outpatient Encounter Medications as of 01/01/2023  Medication Sig   acyclovir (ZOVIRAX) 400 MG tablet TAKE 1 TABLET BY MOUTH 2 TIMES DAILY AS NEEDED FOR PROPHYLAXIS   albuterol (VENTOLIN HFA) 108 (90 Base) MCG/ACT inhaler INHALE 2 PUFFS INTO THE LUNGS EVERY 4 HOURS AS NEEDED FOR WHEEZING OR SHORTNESS OF BREATH.   azelastine (ASTELIN) 0.1 % nasal spray Place 1 spray into both nostrils 2 (two) times daily.   benzonatate (TESSALON) 100 MG capsule Take 1 capsule (100 mg total) by mouth 2 (two) times daily as needed for cough.   bictegravir-emtricitabine-tenofovir AF (BIKTARVY) 50-200-25 MG TABS tablet TAKE 1 TABLET BY MOUTH DAILY.   cromolyn (OPTICROM) 4 % ophthalmic solution Place 1 drop into both eyes daily.   cycloSPORINE (RESTASIS) 0.05 % ophthalmic emulsion Place 1 drop into both eyes 2 (two) times daily.   Ensure (ENSURE) Take 237 mLs by mouth 2 (two) times daily between meals.   EPINEPHrine 0.3 mg/0.3 mL IJ SOAJ injection Inject 1 pen (0.3 mg) into the skin daily as needed for anaphylaxis.   fluconazole (DIFLUCAN) 150 MG tablet Take 1 tablet (150 mg total) by mouth daily. Can repeat every 2-3 days for yeast infection   hydrocortisone 2.5 % lotion Apply topically.   ibuprofen (ADVIL) 800 MG tablet Take 1 tablet (800 mg total) by mouth every 8 (eight) hours as needed.   lamoTRIgine (LAMICTAL) 200 MG tablet Take 1 tablet (200 mg total) by mouth daily.   linaclotide (LINZESS) 290 MCG CAPS capsule Take 1 capsule by mouth daily on empty  stomach   losartan (COZAAR) 50 MG tablet Take 1 tablet (50 mg total) by mouth daily.   montelukast (SINGULAIR) 10 MG tablet Take 1 tablet (10 mg total) by mouth at bedtime.   nystatin (MYCOSTATIN/NYSTOP) powder Apply topically 3 times daily for up to 2 weeks for rash under breast.   ondansetron (ZOFRAN-ODT) 4 MG disintegrating tablet Dissolve 1 tablet (4 mg total) by mouth 3 (three) times daily as needed.   pantoprazole (PROTONIX) 40 MG tablet Take 1 tablet (40 mg total) by mouth daily.   predniSONE (DELTASONE) 10 MG tablet Take 4 tablets (40 mg total) by mouth daily with breakfast.   propranolol (INDERAL) 20 MG tablet Take 1 tablet (20 mg total) by mouth 3 (three) times daily.   QUEtiapine (SEROQUEL) 200 MG tablet Take 2 tablets (400 mg total) by mouth at bedtime.   sertraline (ZOLOFT) 100 MG tablet Take 2 tablets (200 mg total) by mouth daily.   Study - REPRIEVE [phone] - pitavastatin 4 mg or placebo tablet (PI-[place]) Take 1 tablet by mouth daily.  [id number] I [id number] E ACTG [id number]   No facility-administered encounter medications on file as of 01/01/2023.     Patient Active Problem List   Diagnosis Date Noted   Respiratory infection 10/17/2022   Palpitations 10/03/2022   URI (upper respiratory infection) 08/18/2022   PSVT (paroxysmal supraventricular tachycardia) 05/18/2022   Morbid obesity ([place]) 05/18/2022   Cocaine abuse ([place]) 05/18/2022  High risk sexual behavior 05/18/2022   Asthma 05/18/2022   Plantar wart 02/01/2021   Tobacco abuse disorder 02/01/2021   Positive ANA (antinuclear antibody) 01/28/2021   Arthritis 01/28/2021   Rash 01/28/2021   Fibromyalgia syndrome 01/28/2021   Bilateral carpal tunnel syndrome 02/17/2016   Allergic rhinitis due to pollen 02/17/2016   Headache 11/09/2015   Chronic back pain 11/09/2015   Diarrhea 05/19/2015   Cysts of both ovaries 05/12/2015   Abdominal wall hernia 02/03/2015   Abdominal wall mass of periumbilical region 02/77/4128    Schatzki's ring    Diverticulosis of colon without hemorrhage    Other hemorrhoids    Rectal bleeding 12/11/2014   GERD (gastroesophageal reflux disease) 12/11/2014   Esophageal dysphagia 12/11/2014   Constipation 12/11/2014   S/P hysterectomy 09/01/2014   Pelvic pain in female 07/22/2014   Anovulation 06/18/2014   DUB (dysfunctional uterine bleeding) 06/18/2014   Menorrhagia with irregular cycle 06/03/2014   Enlarged uterus 06/03/2014   HTN (hypertension) 01/15/2014   Human immunodeficiency virus (HIV) disease ([place]) 07/17/2013   Schizophrenia ([place]) 07/17/2013   Bipolar disorder, unspecified ([place]) 07/17/2013     Health Maintenance Due  Topic Date Due   MAMMOGRAM  01/27/2017   PAP SMEAR-Modifier  10/12/2021     Review of Systems Review of Systems  Constitutional: Negative for fever, chills, diaphoresis, activity change, appetite change, fatigue and unexpected weight change.  HENT: Negative for congestion, sore throat, rhinorrhea, sneezing, trouble swallowing and sinus pressure.  Eyes: Negative for photophobia and visual disturbance.  Respiratory: Negative for cough, chest tightness, shortness of breath, wheezing and stridor.  Cardiovascular: Negative for chest pain, palpitations and leg swelling.  Gastrointestinal: Negative for nausea, vomiting, abdominal pain, diarrhea, constipation, blood in stool, abdominal distention and anal bleeding.  Genitourinary: Negative for dysuria, hematuria, flank pain and difficulty urinating.  Musculoskeletal: Negative for myalgias, back pain, joint swelling, arthralgias and gait problem.  Skin: Negative for color change, pallor, rash and wound.  Neurological: Negative for dizziness, tremors, weakness and light-headedness.  Hematological: Negative for adenopathy. Does not bruise/bleed easily.  Psychiatric/Behavioral: Negative for behavioral problems, confusion, sleep disturbance, dysphoric mood, decreased concentration and agitation.   Physical  Exam   BP 114/79   Pulse 74   Temp 97.6 F (36.4 C) (Oral)   Wt 234 lb (106.1 kg)   LMP 08/11/2014   SpO2 96%   BMI 38.94 kg/m   Physical Exam  Constitutional:  oriented to person, place, and time. appears well-developed and well-nourished. No distress.  HENT: [place]/AT, PERRLA, no scleral icterus Mouth/Throat: Oropharynx is clear and moist. No oropharyngeal exudate.  Cardiovascular: Normal rate, regular rhythm and normal heart sounds. Exam reveals no gallop and no friction rub.  No murmur heard.  Pulmonary/Chest: Effort normal and breath sounds normal. No respiratory distress.  has no wheezes.  Neck = supple, no nuchal rigidity Abdominal: Soft. Bowel sounds are normal.  exhibits no distension. There is no tenderness.  Lymphadenopathy: no cervical adenopathy. No axillary adenopathy Neurological: alert and oriented to person, place, and time.  Skin: Skin is warm and dry. No rash noted. No erythema.  Psychiatric: a normal mood and affect.  behavior is normal.   Lab Results  Component Value Date   CD4TCELL 36 08/15/2022   Lab Results  Component Value Date   CD4TABS 846 08/15/2022   CD4TABS 772 06/13/2022   CD4TABS 801 09/23/2020   Lab Results  Component Value Date   HIV1RNAQUANT Not Detected 08/15/2022   Lab Results  Component  Value Date   HEPBSAB POS (A) 07/15/2013   Lab Results  Component Value Date   LABRPR NON-REACTIVE 08/15/2022    CBC Lab Results  Component Value Date   WBC 8.1 10/23/2022   RBC 4.46 10/23/2022   HGB 14.3 10/23/2022   HCT 42.3 10/23/2022   PLT 229 10/23/2022   MCV 94.8 10/23/2022   MCH 32.1 10/23/2022   MCHC 33.8 10/23/2022   RDW 13.0 10/23/2022   LYMPHSABS 2,194 06/13/2022   MONOABS 528 05/10/2016   EOSABS 21 06/13/2022    BMET Lab Results  Component Value Date   NA 138 10/23/2022   K 4.3 10/23/2022   CL 106 10/23/2022   CO2 27 10/23/2022   GLUCOSE 102 (H) 10/23/2022   BUN 13 10/23/2022   CREATININE 0.68 10/23/2022   CALCIUM  9.5 10/23/2022   GFRNONAA >60 10/23/2022   GFRAA 88 09/23/2020      Assessment and Plan HIV disease= Refills on acy, biktarvy; labs; I think she would be a good candidate for study.  Health maintenance =Started on rosuvastatin for reprieve trial  Vaginal discharge = will empirically treat for vaginal candidiasis but also screen for sti. She last had sex 3-4 wks ago  Hx of esophageal stricture = will see if can refer to [employer]. Also needs colonoscopy

## 2023-01-02 ENCOUNTER — Other Ambulatory Visit: Payer: Self-pay

## 2023-01-02 ENCOUNTER — Other Ambulatory Visit (HOSPITAL_COMMUNITY): Payer: Self-pay

## 2023-01-02 LAB — CERVICOVAGINAL ANCILLARY ONLY
Comment: NEGATIVE
Comment: NEGATIVE
Comment: NEGATIVE
Comment: NEGATIVE
Comment: NEGATIVE
Comment: NORMAL

## 2023-01-02 LAB — URINE CYTOLOGY ANCILLARY ONLY
Chlamydia: NEGATIVE
Comment: NEGATIVE
Comment: NORMAL
Neisseria Gonorrhea: NEGATIVE

## 2023-01-02 LAB — T-HELPER CELL (CD4) - (RCID CLINIC ONLY)
CD4 % Helper T Cell: 39 % (ref 33–65)
CD4 T Cell Abs: 947 /uL (ref 400–1790)

## 2023-01-03 LAB — COMPLETE METABOLIC PANEL WITH GFR
AG Ratio: 1.5 (calc) (ref 1.0–2.5)
ALT: 11 U/L (ref 6–29)
AST: 16 U/L (ref 10–35)
Albumin: 4.3 g/dL (ref 3.6–5.1)
Alkaline phosphatase (APISO): 64 U/L (ref 31–125)
BUN: 15 mg/dL (ref 7–25)
CO2: 30 mmol/L (ref 20–32)
Calcium: 9.7 mg/dL (ref 8.6–10.2)
Chloride: 105 mmol/L (ref 98–110)
Creat: 0.69 mg/dL (ref 0.50–0.99)
Globulin: 2.8 g/dL (calc) (ref 1.9–3.7)
Glucose, Bld: 88 mg/dL (ref 65–99)
Potassium: 4.2 mmol/L (ref 3.5–5.3)
Sodium: 140 mmol/L (ref 135–146)
Total Bilirubin: 0.7 mg/dL (ref 0.2–1.2)
Total Protein: 7.1 g/dL (ref 6.1–8.1)
eGFR: 106 mL/min/{1.73_m2} (ref >=60)

## 2023-01-03 LAB — CBC WITH DIFFERENTIAL/PLATELET
Absolute Monocytes: 561 cells/uL (ref 200–950)
Basophils Absolute: 33 cells/uL (ref 0–200)
Basophils Relative: 0.5 %
Eosinophils Absolute: 40 cells/uL (ref 15–500)
Eosinophils Relative: 0.6 %
HCT: 38.8 % (ref 35.0–45.0)
Hemoglobin: 13.6 g/dL (ref 11.7–15.5)
Lymphs Abs: 2501 cells/uL (ref 850–3900)
MCH: 32 pg (ref 27.0–33.0)
MCHC: 35.1 g/dL (ref 32.0–36.0)
MCV: 91.3 fL (ref 80.0–100.0)
MPV: 9.8 fL (ref 7.5–12.5)
Monocytes Relative: 8.5 %
Neutro Abs: 3465 cells/uL (ref 1500–7800)
Neutrophils Relative %: 52.5 %
Platelets: 221 10*3/uL (ref 140–400)
RBC: 4.25 10*6/uL (ref 3.80–5.10)
RDW: 12.5 % (ref 11.0–15.0)
Total Lymphocyte: 37.9 %
WBC: 6.6 10*3/uL (ref 3.8–10.8)

## 2023-01-03 LAB — RPR: RPR Ser Ql: NONREACTIVE

## 2023-01-03 LAB — HIV-1 RNA QUANT-NO REFLEX-BLD
HIV 1 RNA Quant: NOT DETECTED Copies/mL
HIV-1 RNA Quant, Log: NOT DETECTED Log cps/mL

## 2023-01-05 ENCOUNTER — Other Ambulatory Visit (HOSPITAL_COMMUNITY): Payer: Self-pay

## 2023-01-05 ENCOUNTER — Other Ambulatory Visit: Payer: Self-pay | Admitting: Nurse Practitioner

## 2023-01-05 ENCOUNTER — Other Ambulatory Visit: Payer: Self-pay

## 2023-01-05 DIAGNOSIS — J4520 Mild intermittent asthma, uncomplicated: Secondary | ICD-10-CM

## 2023-01-10 ENCOUNTER — Other Ambulatory Visit: Payer: Self-pay

## 2023-01-10 ENCOUNTER — Other Ambulatory Visit (HOSPITAL_COMMUNITY): Payer: Self-pay

## 2023-01-10 ENCOUNTER — Encounter (INDEPENDENT_AMBULATORY_CARE_PROVIDER_SITE_OTHER): Payer: Self-pay | Admitting: *Deleted

## 2023-01-10 VITALS — BP 142/92 | HR 58 | Temp 98.0°F | Ht 64.75 in | Wt 231.2 lb

## 2023-01-10 DIAGNOSIS — Z006 Encounter for examination for normal comparison and control in clinical research program: Secondary | ICD-10-CM

## 2023-01-10 NOTE — Research (Signed)
Rachel Underwood was here today to screen for the [facility] study. Informed consent was obtained after she reviewed the consent at home and we discussed it in detail. She was able to verbalize the purpose of the study and her role as a participant. She is currently on Biktarvy and has always been compliant with her medications. EKGs, PE were done as well as lab work. The results for the antibody sensitivty test can take up to 10 weeks to determine eligibility.

## 2023-01-12 ENCOUNTER — Other Ambulatory Visit (HOSPITAL_COMMUNITY): Payer: Self-pay

## 2023-01-12 ENCOUNTER — Telehealth: Payer: Self-pay | Admitting: Family Medicine

## 2023-01-12 ENCOUNTER — Other Ambulatory Visit: Payer: Self-pay | Admitting: Family Medicine

## 2023-01-12 DIAGNOSIS — F209 Schizophrenia, unspecified: Secondary | ICD-10-CM

## 2023-01-12 NOTE — Telephone Encounter (Signed)
Pt called stating she is wanting a referral to [facility]. [facility] will not see her.

## 2023-01-12 NOTE — Telephone Encounter (Signed)
Referral placed to behavioral health in [place]

## 2023-01-16 ENCOUNTER — Ambulatory Visit: Payer: Medicaid Other | Admitting: Family Medicine

## 2023-01-20 ENCOUNTER — Other Ambulatory Visit: Payer: Self-pay | Admitting: Family Medicine

## 2023-01-22 ENCOUNTER — Other Ambulatory Visit: Payer: Self-pay

## 2023-01-22 ENCOUNTER — Other Ambulatory Visit (HOSPITAL_COMMUNITY): Payer: Self-pay

## 2023-01-22 MED ORDER — ONDANSETRON 4 MG PO TBDP
4.0000 mg | ORAL_TABLET | Freq: Three times a day (TID) | ORAL | 3 refills | Status: DC | PRN
  Filled 2023-01-22: qty 20, 7d supply, fill #0
  Filled 2023-01-29: qty 20, 7d supply, fill #1
  Filled 2023-03-14 – 2023-03-30 (×2): qty 20, 7d supply, fill #2
  Filled 2023-06-05: qty 20, 7d supply, fill #3

## 2023-01-25 ENCOUNTER — Other Ambulatory Visit (HOSPITAL_COMMUNITY): Payer: Self-pay

## 2023-01-29 ENCOUNTER — Other Ambulatory Visit (HOSPITAL_COMMUNITY): Payer: Self-pay

## 2023-01-29 ENCOUNTER — Other Ambulatory Visit: Payer: Self-pay | Admitting: Nurse Practitioner

## 2023-01-29 ENCOUNTER — Other Ambulatory Visit: Payer: Self-pay | Admitting: Family Medicine

## 2023-01-29 ENCOUNTER — Other Ambulatory Visit: Payer: Self-pay

## 2023-01-29 DIAGNOSIS — J301 Allergic rhinitis due to pollen: Secondary | ICD-10-CM

## 2023-01-29 MED ORDER — MONTELUKAST SODIUM 10 MG PO TABS
10.0000 mg | ORAL_TABLET | Freq: Every day | ORAL | 3 refills | Status: DC
  Filled 2023-01-29 – 2023-06-15 (×3): qty 30, 30d supply, fill #0

## 2023-01-29 NOTE — Progress Notes (Deleted)
GI Office Note    Referring Provider: Carlyle Basques, MD Primary Care Physician:  Alvira Monday, [place]  Primary Gastroenterologist:  Chief Complaint   No chief complaint on file.    History of Present Illness   Rachel Underwood is a 52 y.o. female presenting today      EGD January 2016: Erosive reflux esophagitis.  Noncritical Schatzki ring status post dilation.  Very small hiatal hernia.  Subtle abnormal gastric mucosa unclear significance.  Stomach biopsy with mild chronic inactive gastritis no H. pylori.  Colonoscopy January 2016: Anal canal hemorrhoids likely source of hematochezia.  Diffuse left-sided diverticula.  Medications   Current Outpatient Medications  Medication Sig Dispense Refill   acyclovir (ZOVIRAX) 400 MG tablet Take 1 tablet (400 mg total) by mouth 2 (two) times daily as needed for prophylaxis 180 tablet 3   albuterol (VENTOLIN HFA) 108 (90 Base) MCG/ACT inhaler INHALE 2 PUFFS INTO THE LUNGS EVERY 4 HOURS AS NEEDED FOR WHEEZING OR SHORTNESS OF BREATH. 18 g 2   azelastine (ASTELIN) 0.1 % nasal spray Place 1 spray into both nostrils 2 (two) times daily. 30 mL 12   benzonatate (TESSALON) 100 MG capsule Take 1 capsule (100 mg total) by mouth 2 (two) times daily as needed for cough. 20 capsule 0   bictegravir-emtricitabine-tenofovir AF (BIKTARVY) 50-200-25 MG TABS tablet TAKE 1 TABLET BY MOUTH DAILY. 30 tablet 11   cromolyn (OPTICROM) 4 % ophthalmic solution Place 1 drop into both eyes daily. 10 mL 3   cycloSPORINE (RESTASIS) 0.05 % ophthalmic emulsion Place 1 drop into both eyes 2 (two) times daily. 180 each 2   Ensure (ENSURE) Take 237 mLs by mouth 2 (two) times daily between meals. 237 mL 6   EPINEPHrine 0.3 mg/0.3 mL IJ SOAJ injection Inject 1 pen (0.3 mg) into the skin daily as needed for anaphylaxis. 1 each 1   fluconazole (DIFLUCAN) 150 MG tablet Take 1 tablet (150 mg total) by mouth daily. Every 3-4 days if still symptomatic 4 tablet 0   ibuprofen  (ADVIL) 800 MG tablet Take 1 tablet (800 mg total) by mouth every 8 (eight) hours as needed. 30 tablet 0   lamoTRIgine (LAMICTAL) 200 MG tablet Take 1 tablet (200 mg total) by mouth daily. 30 tablet 3   linaclotide (LINZESS) 290 MCG CAPS capsule Take 1 capsule by mouth daily on empty stomach 30 capsule 2   montelukast (SINGULAIR) 10 MG tablet Take 1 tablet (10 mg total) by mouth at bedtime. 30 tablet 3   nystatin (MYCOSTATIN/NYSTOP) powder Apply topically 3 times daily for up to 2 weeks for rash under breast. 30 g 1   ondansetron (ZOFRAN-ODT) 4 MG disintegrating tablet Dissolve 1 tablet (4 mg total) by mouth 3 (three) times daily as needed. 20 tablet 3   propranolol (INDERAL) 20 MG tablet Take 1 tablet (20 mg total) by mouth 2 (two) times daily. 60 tablet 6   QUEtiapine (SEROQUEL) 200 MG tablet Take 2 tablets (400 mg total) by mouth at bedtime. 60 tablet 3   rosuvastatin (CRESTOR) 10 MG tablet Take 1 tablet (10 mg total) by mouth daily. 30 tablet 11   sertraline (ZOLOFT) 100 MG tablet Take 2 tablets (200 mg total) by mouth daily. 60 tablet 3   No current facility-administered medications for this visit.    Allergies   Allergies as of 01/30/2023 - Review Complete 01/01/2023  Allergen Reaction Noted   Flecainide Swelling 03/08/2016   Piroxicam Anaphylaxis 05/12/2015   Abilify [aripiprazole]  05/12/2015   Citalopram Hives 05/12/2015   Codeine Nausea And Vomiting 03/25/2014   Depakote [divalproex sodium] Swelling 05/12/2015   Gabapentin  05/13/2014   Haldol [haloperidol lactate] Other (See Comments) 03/25/2014   Haloperidol  09/06/2020   Indomethacin Swelling 12/11/2014   Invega [paliperidone] Other (See Comments) 06/10/2014   Latuda [lurasidone hcl] Itching 05/12/2015   Lithium Hives 06/03/2014   Lurasidone  09/06/2020   Promethazine Swelling 02/03/2015   Trazodone and nefazodone Other (See Comments) 12/18/2014   Lexapro [escitalopram oxalate] Rash 07/15/2013    Past Medical History    Past Medical History:  Diagnosis Date   Abnormal Pap smear of cervix 2005(EST)   ckc for abnl pap- [facility] [place]   Allergy    Asthma    Bipolar 1 disorder ([place])    Borderline personality disorder ([place])    BPD (bronchopulmonary dysplasia)    Carpal tunnel syndrome    bilateral wrist   Depression    Dysmenorrhea 06/03/2014   Enlarged uterus 06/03/2014   Fibromyalgia    GERD (gastroesophageal reflux disease)    Herpes simplex without mention of complication    HIV (human immunodeficiency virus infection) ([place])    Hyperlipidemia    Hypertension    Menorrhagia with irregular cycle 06/03/2014   Osteoarthritis    Seizures ([place])    had as a teenager, unknown etiology and no meds.    Past Surgical History   Past Surgical History:  Procedure Laterality Date   ABDOMINAL HYSTERECTOMY N/A 09/01/2014   Procedure: HYSTERECTOMY ABDOMINAL;  Surgeon: Jonnie Kind, MD;  Location: [facility];  Service: Gynecology;  Laterality: N/A;   ANKLE SURGERY Right    repair of ligaments   BACK SURGERY     BILATERAL SALPINGECTOMY Bilateral 09/01/2014   Procedure: BILATERAL SALPINGECTOMY;  Surgeon: Jonnie Kind, MD;  Location: [facility];  Service: Gynecology;  Laterality: Bilateral;   CESAREAN SECTION     ckc     COLONOSCOPY N/A 12/23/2014   [id number] canal hemorrhoids left-sided diverticula   ESOPHAGOGASTRODUODENOSCOPY N/A 12/23/2014   [id number] reflux/noncritical shatzkis ring s/p dilation/small HH   EXCISION OF SKIN TAG N/A 09/01/2014   Procedure: EXCISION OF SKIN NEVUS;  Surgeon: Jonnie Kind, MD;  Location: [facility];  Service: Gynecology;  Laterality: N/A;   FRACTURE SURGERY     MALONEY DILATION N/A 12/23/2014   Procedure: Venia Minks DILATION;  Surgeon: Daneil Dolin, MD;  Location: [facility];  Service: Endoscopy;  Laterality: N/A;   SAVORY DILATION N/A 12/23/2014   Procedure: SAVORY DILATION;  Surgeon: Daneil Dolin, MD;  Location: [facility];  Service: Endoscopy;  Laterality:  N/A;   SCAR REVISION N/A 09/01/2014   Procedure: EXCISION OF CICATRIX;  Surgeon: Jonnie Kind, MD;  Location: [facility];  Service: Gynecology;  Laterality: N/A;   SHOULDER SURGERY Right    rotator cuff   SPINE SURGERY      Past Family History   Family History  Problem Relation Age of Onset   Fibroids Mother    Hypertension Mother    Depression Mother    Irritable bowel syndrome Mother    Arthritis Mother    Heart disease Mother    Mental illness Mother    Stroke Mother    Vision loss Mother    Lupus Mother    Hypertension Father    Mental illness Father    Arthritis Father    Heart disease Father    Vision loss Father  Heart disease Maternal Grandmother    Cancer Maternal Grandmother        breast   Depression Maternal Grandmother    Hyperlipidemia Maternal Grandmother    Hypertension Maternal Grandmother    Bipolar disorder Daughter    Schizophrenia Daughter    Lupus Daughter    Cancer Paternal Grandfather        prostate   Heart disease Paternal Grandfather    Hyperlipidemia Paternal Grandfather    Hypertension Paternal Grandfather    Dementia Paternal Grandmother    Hyperlipidemia Paternal Grandmother    Hypertension Paternal Grandmother    Heart disease Maternal Grandfather    Alzheimer's disease Maternal Grandfather    Hyperlipidemia Maternal Grandfather    Hypertension Maternal Grandfather    Anxiety disorder Daughter    ADD / ADHD Son    Hypertension Brother    Early death Brother    Learning disabilities Brother    Mental illness Brother    Colon cancer Neg Hx     Past Social History   Social History   Socioeconomic History   Marital status: Divorced    Spouse name: Not on file   Number of children: 3   Years of education: Not on file   Highest education level: Not on file  Occupational History   Not on file  Tobacco Use   Smoking status: Every Day    Types: E-cigarettes   Smokeless tobacco: Never   Tobacco comments:    States she is  cutting back  Vaping Use   Vaping Use: Never used  Substance and Sexual Activity   Alcohol use: Not Currently    Comment: Very arely   Drug use: No    Types: "Crack" cocaine    Comment: recovering cocaine addict. last used 4 weeks ago   Sexual activity: Yes    Partners: Male    Birth control/protection: Condom    Comment: given condoms  Other Topics Concern   Not on file  Social History Narrative   Lives with a friend at the moment    Social Determinants of Health   Financial Resource Strain: Not on file  Food Insecurity: Not on file  Transportation Needs: Not on file  Physical Activity: Inactive (06/01/2022)   Exercise Vital Sign    Days of Exercise per Week: 0 days    Minutes of Exercise per Session: 0 min  Stress: Stress Concern Present (06/01/2022)   [place]    Feeling of Stress : To some extent  Social Connections: Not on file  Intimate Partner Violence: Not on file    Review of Systems   General: Negative for anorexia, weight loss, fever, chills, fatigue, weakness. Eyes: Negative for vision changes.  ENT: Negative for hoarseness, difficulty swallowing , nasal congestion. CV: Negative for chest pain, angina, palpitations, dyspnea on exertion, peripheral edema.  Respiratory: Negative for dyspnea at rest, dyspnea on exertion, cough, sputum, wheezing.  GI: See history of present illness. GU:  Negative for dysuria, hematuria, urinary incontinence, urinary frequency, nocturnal urination.  MS: Negative for joint pain, low back pain.  Derm: Negative for rash or itching.  Neuro: Negative for weakness, abnormal sensation, seizure, frequent headaches, memory loss,  confusion.  Psych: Negative for anxiety, depression, suicidal ideation, hallucinations.  Endo: Negative for unusual weight change.  Heme: Negative for bruising or bleeding. Allergy: Negative for rash or hives.  Physical Exam   LMP 08/11/2014     General: Well-nourished, well-developed  in no acute distress.  Head: Normocephalic, atraumatic.   Eyes: Conjunctiva pink, no icterus. Mouth: Oropharyngeal mucosa moist and pink , no lesions erythema or exudate. Neck: Supple without thyromegaly, masses, or lymphadenopathy.  Lungs: Clear to auscultation bilaterally.  Heart: Regular rate and rhythm, no murmurs rubs or gallops.  Abdomen: Bowel sounds are normal, nontender, nondistended, no hepatosplenomegaly or masses,  no abdominal bruits or hernia, no rebound or guarding.   Rectal: *** Extremities: No lower extremity edema. No clubbing or deformities.  Neuro: Alert and oriented x 4 , grossly normal neurologically.  Skin: Warm and dry, no rash or jaundice.   Psych: Alert and cooperative, normal mood and affect.  Labs   Lab Results  Component Value Date   CREATININE 0.69 01/01/2023   BUN 15 01/01/2023   NA 140 01/01/2023   K 4.2 01/01/2023   CL 105 01/01/2023   CO2 30 01/01/2023   Lab Results  Component Value Date   WBC 6.6 01/01/2023   HGB 13.6 01/01/2023   HCT 38.8 01/01/2023   MCV 91.3 01/01/2023   PLT 221 01/01/2023   Lab Results  Component Value Date   HGBA1C 5.1 08/15/2022   Lab Results  Component Value Date   TSH 0.489 02/09/2015   Lab Results  Component Value Date   ALT 11 01/01/2023   AST 16 01/01/2023   ALKPHOS 54 10/23/2022   BILITOT 0.7 01/01/2023    Imaging Studies   No results found.  Assessment       PLAN   ***   Laureen Ochs. Bobby Rumpf, [place], [facility]

## 2023-01-30 ENCOUNTER — Other Ambulatory Visit: Payer: Self-pay

## 2023-01-30 ENCOUNTER — Other Ambulatory Visit (HOSPITAL_COMMUNITY): Payer: Self-pay

## 2023-01-30 ENCOUNTER — Ambulatory Visit: Payer: Medicaid Other | Admitting: Gastroenterology

## 2023-01-30 ENCOUNTER — Encounter (HOSPITAL_COMMUNITY): Payer: Self-pay

## 2023-01-31 ENCOUNTER — Encounter: Payer: Self-pay | Admitting: Gastroenterology

## 2023-02-05 ENCOUNTER — Other Ambulatory Visit: Payer: Self-pay

## 2023-02-08 ENCOUNTER — Other Ambulatory Visit (HOSPITAL_COMMUNITY): Payer: Self-pay

## 2023-02-12 ENCOUNTER — Other Ambulatory Visit (HOSPITAL_COMMUNITY): Payer: Self-pay

## 2023-02-14 ENCOUNTER — Other Ambulatory Visit (HOSPITAL_COMMUNITY): Payer: Self-pay

## 2023-02-23 ENCOUNTER — Telehealth: Payer: Self-pay

## 2023-02-23 NOTE — Telephone Encounter (Signed)
Received email from patient's case manager, Lula Olszewski. Patient is in need of counseling and behavioral health referral. [place] desk to reach out and schedule Mireyah with [facility] counselor.   Message sent to Dr. Baxter Flattery requesting [facility] referral for medication management.   Beryle Flock, RN

## 2023-03-05 ENCOUNTER — Other Ambulatory Visit (HOSPITAL_COMMUNITY): Payer: Self-pay

## 2023-03-05 ENCOUNTER — Other Ambulatory Visit: Payer: Self-pay

## 2023-03-06 ENCOUNTER — Ambulatory Visit: Payer: Medicaid Other

## 2023-03-08 ENCOUNTER — Other Ambulatory Visit (HOSPITAL_COMMUNITY): Payer: Self-pay

## 2023-03-09 ENCOUNTER — Ambulatory Visit: Payer: Medicaid Other | Admitting: Family Medicine

## 2023-03-12 ENCOUNTER — Other Ambulatory Visit (HOSPITAL_COMMUNITY): Payer: Self-pay

## 2023-03-13 ENCOUNTER — Ambulatory Visit: Payer: Medicaid Other

## 2023-03-14 ENCOUNTER — Other Ambulatory Visit: Payer: Self-pay

## 2023-03-14 ENCOUNTER — Other Ambulatory Visit (HOSPITAL_COMMUNITY): Payer: Self-pay

## 2023-03-14 ENCOUNTER — Other Ambulatory Visit: Payer: Self-pay | Admitting: Nurse Practitioner

## 2023-03-14 DIAGNOSIS — J4520 Mild intermittent asthma, uncomplicated: Secondary | ICD-10-CM

## 2023-03-14 DIAGNOSIS — J301 Allergic rhinitis due to pollen: Secondary | ICD-10-CM

## 2023-03-15 ENCOUNTER — Encounter: Payer: Self-pay | Admitting: Family Medicine

## 2023-03-15 ENCOUNTER — Other Ambulatory Visit: Payer: Self-pay

## 2023-03-15 ENCOUNTER — Other Ambulatory Visit (HOSPITAL_COMMUNITY): Payer: Self-pay

## 2023-03-15 ENCOUNTER — Ambulatory Visit (INDEPENDENT_AMBULATORY_CARE_PROVIDER_SITE_OTHER): Payer: Medicaid Other | Admitting: Family Medicine

## 2023-03-15 VITALS — BP 138/86 | HR 60 | Ht 65.0 in | Wt 246.0 lb

## 2023-03-15 DIAGNOSIS — F2089 Other schizophrenia: Secondary | ICD-10-CM

## 2023-03-15 DIAGNOSIS — F3170 Bipolar disorder, currently in remission, most recent episode unspecified: Secondary | ICD-10-CM | POA: Diagnosis not present

## 2023-03-15 DIAGNOSIS — F209 Schizophrenia, unspecified: Secondary | ICD-10-CM

## 2023-03-15 DIAGNOSIS — S0990XA Unspecified injury of head, initial encounter: Secondary | ICD-10-CM | POA: Insufficient documentation

## 2023-03-15 DIAGNOSIS — F319 Bipolar disorder, unspecified: Secondary | ICD-10-CM

## 2023-03-15 MED ORDER — OXYCODONE-ACETAMINOPHEN 5-325 MG PO TABS
1.0000 | ORAL_TABLET | ORAL | 0 refills | Status: AC | PRN
Start: 2023-03-15 — End: 2023-03-20
  Filled 2023-03-15: qty 15, 3d supply, fill #0

## 2023-03-15 MED ORDER — CYCLOBENZAPRINE HCL 5 MG PO TABS
5.0000 mg | ORAL_TABLET | Freq: Three times a day (TID) | ORAL | 1 refills | Status: DC | PRN
  Filled 2023-03-15: qty 30, 10d supply, fill #0

## 2023-03-15 NOTE — Assessment & Plan Note (Addendum)
She takes Seroquel and Lamictal for her bipolar She would like to see a psychiatrist as her medications are currently being prescribed by Dr. Drue Second Referral placed

## 2023-03-15 NOTE — Patient Instructions (Addendum)
I appreciate the opportunity to provide care to you today!    Follow up:  3 months  Please pick up your prescription at the pharmacy Flexeril can cause drowsiness, and it is recommended to take it at bedtime. Please do not take Flexeril while driving  A short supply of narcotics has been sent to your pharmacy to help with body pain and aches.   You will be contacted for imaging studies to get a CT scan of your head    Referral: Psychiatry    Please continue to a heart-healthy diet and increase your physical activities. Try to exercise for at least five days a week.      It was a pleasure to see you and I look forward to continuing to work together on your health and well-being. Please do not hesitate to call the office if you need care or have questions about your care.   Have a wonderful day and week. With Gratitude, Gilmore Laroche MSN, FNP-BC

## 2023-03-15 NOTE — Progress Notes (Signed)
Established Patient Office Visit  Subjective:  Patient ID: Greydis Heinzelman, female    DOB: 06-Oct-1971  Age: 52 y.o. MRN: [id number]  CC:  Chief Complaint  Patient presents with   Fall    Pt reports fall while sleeping, hit her head and is having body pains. Fall was 03/03/23.    HPI Mikaiya Ernestine Mcmurrayngle is a 52 y.o. female  presents with complaints of a fall while sleepwalking on 03/03/2023. For the details of today's visit, please refer to the assessment and plan.  Fall: She reports that she has had 2 falls in this month. She reports sleepwalking hitting her head on her dresser. She complains of dizziness and  headaches. No nausea or vomiting reported. No involuntary loss of memory reported. She takes Seroquel and notes that the side effects of the medication is sleepwalking. She complains of bodyaches in her head, neck, and shoulders. Of note the patient has arthritis in multiple joints.     Past Medical History:  Diagnosis Date   Abnormal Pap smear of cervix 2005(EST)   ckc for abnl pap- [facility] [place]   Allergy    Asthma    Bipolar 1 disorder    Borderline personality disorder    BPD (bronchopulmonary dysplasia)    Carpal tunnel syndrome    bilateral wrist   Depression    Dysmenorrhea 06/03/2014   Enlarged uterus 06/03/2014   Fibromyalgia    GERD (gastroesophageal reflux disease)    Herpes simplex without mention of complication    HIV (human immunodeficiency virus infection)    Hyperlipidemia    Hypertension    Menorrhagia with irregular cycle 06/03/2014   Osteoarthritis    Seizures    had as a teenager, unknown etiology and no meds.    Past Surgical History:  Procedure Laterality Date   ABDOMINAL HYSTERECTOMY N/A 09/01/2014   Procedure: HYSTERECTOMY ABDOMINAL;  Surgeon: Tilda BurrowJohn Ferguson V, MD;  Location: [facility];  Service: Gynecology;  Laterality: N/A;   ANKLE SURGERY Right    repair of ligaments   BACK SURGERY     BILATERAL SALPINGECTOMY Bilateral 09/01/2014    Procedure: BILATERAL SALPINGECTOMY;  Surgeon: Tilda BurrowJohn Ferguson V, MD;  Location: [facility];  Service: Gynecology;  Laterality: Bilateral;   CESAREAN SECTION     ckc     COLONOSCOPY N/A 12/23/2014   WUJ:WJXBRMR:anal canal hemorrhoids left-sided diverticula   ESOPHAGOGASTRODUODENOSCOPY N/A 12/23/2014   JYN:WGNFAOZRMR:erosive reflux/noncritical shatzkis ring s/p dilation/small HH   EXCISION OF SKIN TAG N/A 09/01/2014   Procedure: EXCISION OF SKIN NEVUS;  Surgeon: Tilda BurrowJohn Ferguson V, MD;  Location: [facility];  Service: Gynecology;  Laterality: N/A;   FRACTURE SURGERY     MALONEY DILATION N/A 12/23/2014   Procedure: Elease HashimotoMALONEY DILATION;  Surgeon: Corbin Adeobert M Rourk, MD;  Location: [facility];  Service: Endoscopy;  Laterality: N/A;   SAVORY DILATION N/A 12/23/2014   Procedure: SAVORY DILATION;  Surgeon: Corbin Adeobert M Rourk, MD;  Location: [facility];  Service: Endoscopy;  Laterality: N/A;   SCAR REVISION N/A 09/01/2014   Procedure: EXCISION OF CICATRIX;  Surgeon: Tilda BurrowJohn Ferguson V, MD;  Location: [facility];  Service: Gynecology;  Laterality: N/A;   SHOULDER SURGERY Right    rotator cuff   SPINE SURGERY      Family History  Problem Relation Age of Onset   Fibroids Mother    Hypertension Mother    Depression Mother    Irritable bowel syndrome Mother    Arthritis Mother    Heart  disease Mother    Mental illness Mother    Stroke Mother    Vision loss Mother    Lupus Mother    Hypertension Father    Mental illness Father    Arthritis Father    Heart disease Father    Vision loss Father    Heart disease Maternal Grandmother    Cancer Maternal Grandmother        breast   Depression Maternal Grandmother    Hyperlipidemia Maternal Grandmother    Hypertension Maternal Grandmother    Bipolar disorder Daughter    Schizophrenia Daughter    Lupus Daughter    Cancer Paternal Grandfather        prostate   Heart disease Paternal Grandfather    Hyperlipidemia Paternal Grandfather    Hypertension Paternal Grandfather    Dementia  Paternal Grandmother    Hyperlipidemia Paternal Grandmother    Hypertension Paternal Grandmother    Heart disease Maternal Grandfather    Alzheimer's disease Maternal Grandfather    Hyperlipidemia Maternal Grandfather    Hypertension Maternal Grandfather    Anxiety disorder Daughter    ADD / ADHD Son    Hypertension Brother    Early death Brother    Learning disabilities Brother    Mental illness Brother    Colon cancer Neg Hx     Social History   Socioeconomic History   Marital status: Divorced    Spouse name: Not on file   Number of children: 3   Years of education: Not on file   Highest education level: Not on file  Occupational History   Not on file  Tobacco Use   Smoking status: Every Day    Types: E-cigarettes   Smokeless tobacco: Never   Tobacco comments:    States she is cutting back  Vaping Use   Vaping Use: Never used  Substance and Sexual Activity   Alcohol use: Not Currently    Comment: Very arely   Drug use: No    Types: "Crack" cocaine    Comment: recovering cocaine addict. last used 4 weeks ago   Sexual activity: Yes    Partners: Male    Birth control/protection: Condom    Comment: given condoms  Other Topics Concern   Not on file  Social History Narrative   Lives with a friend at the moment    Social Determinants of Health   Financial Resource Strain: Not on file  Food Insecurity: Not on file  Transportation Needs: Not on file  Physical Activity: Inactive (06/01/2022)   Exercise Vital Sign    Days of Exercise per Week: 0 days    Minutes of Exercise per Session: 0 min  Stress: Stress Concern Present (06/01/2022)   [employer] of Occupational Health - Occupational Stress Questionnaire    Feeling of Stress : To some extent  Social Connections: Not on file  Intimate Partner Violence: Not on file    Outpatient Medications Prior to Visit  Medication Sig Dispense Refill   acyclovir (ZOVIRAX) 400 MG tablet Take 1 tablet (400 mg total) by  mouth 2 (two) times daily as needed for prophylaxis 180 tablet 3   albuterol (VENTOLIN HFA) 108 (90 Base) MCG/ACT inhaler INHALE 2 PUFFS INTO THE LUNGS EVERY 4 HOURS AS NEEDED FOR WHEEZING OR SHORTNESS OF BREATH. 18 g 2   azelastine (ASTELIN) 0.1 % nasal spray Place 1 spray into both nostrils 2 (two) times daily. 30 mL 12   benzonatate (TESSALON) 100 MG capsule Take 1  capsule (100 mg total) by mouth 2 (two) times daily as needed for cough. 20 capsule 0   bictegravir-emtricitabine-tenofovir AF (BIKTARVY) 50-200-25 MG TABS tablet TAKE 1 TABLET BY MOUTH DAILY. 30 tablet 11   cromolyn (OPTICROM) 4 % ophthalmic solution Place 1 drop into both eyes daily. 10 mL 3   cycloSPORINE (RESTASIS) 0.05 % ophthalmic emulsion Place 1 drop into both eyes 2 (two) times daily. 180 each 2   Ensure (ENSURE) Take 237 mLs by mouth 2 (two) times daily between meals. 237 mL 6   EPINEPHrine 0.3 mg/0.3 mL IJ SOAJ injection Inject 1 pen (0.3 mg) into the skin daily as needed for anaphylaxis. 1 each 1   fluconazole (DIFLUCAN) 150 MG tablet Take 1 tablet (150 mg total) by mouth daily. Every 3-4 days if still symptomatic 4 tablet 0   ibuprofen (ADVIL) 800 MG tablet Take 1 tablet (800 mg total) by mouth every 8 (eight) hours as needed. 30 tablet 0   lamoTRIgine (LAMICTAL) 200 MG tablet Take 1 tablet (200 mg total) by mouth daily. 30 tablet 3   linaclotide (LINZESS) 290 MCG CAPS capsule Take 1 capsule by mouth daily on empty stomach 30 capsule 2   montelukast (SINGULAIR) 10 MG tablet Take 1 tablet (10 mg total) by mouth at bedtime. 30 tablet 3   nystatin (MYCOSTATIN/NYSTOP) powder Apply topically 3 times daily for up to 2 weeks for rash under breast. 30 g 1   ondansetron (ZOFRAN-ODT) 4 MG disintegrating tablet Dissolve 1 tablet (4 mg total) by mouth 3 (three) times daily as needed. 20 tablet 3   propranolol (INDERAL) 20 MG tablet Take 1 tablet (20 mg total) by mouth 2 (two) times daily. 60 tablet 6   QUEtiapine (SEROQUEL) 200 MG  tablet Take 2 tablets (400 mg total) by mouth at bedtime. 60 tablet 3   rosuvastatin (CRESTOR) 10 MG tablet Take 1 tablet (10 mg total) by mouth daily. 30 tablet 11   sertraline (ZOLOFT) 100 MG tablet Take 2 tablets (200 mg total) by mouth daily. 60 tablet 3   No facility-administered medications prior to visit.    Allergies  Allergen Reactions   Flecainide Swelling   Piroxicam Anaphylaxis   Abilify [Aripiprazole]    Citalopram Hives   Codeine Nausea And Vomiting   Depakote [Divalproex Sodium] Swelling   Gabapentin     Hives    Haldol [Haloperidol Lactate] Other (See Comments)    Slurs speech and "stumble"   Haloperidol    Indomethacin Swelling    Dizzy, tigling of face   Invega [Paliperidone] Other (See Comments)    Slurred speech, drooling   Latuda [Lurasidone Hcl] Itching   Lithium Hives   Lurasidone    Promethazine Swelling    Arms and hands   Trazodone And Nefazodone Other (See Comments)    Dry mouth, congestion, severe dryness.   Lexapro [Escitalopram Oxalate] Rash    ROS Review of Systems  Constitutional:  Negative for chills and fever.  Eyes:  Negative for visual disturbance.  Respiratory:  Negative for chest tightness and shortness of breath.   Neurological:  Positive for headaches. Negative for dizziness.      Objective:    Physical Exam HENT:     Head: Normocephalic.     Mouth/Throat:     Mouth: Mucous membranes are moist.  Cardiovascular:     Rate and Rhythm: Normal rate.     Heart sounds: Normal heart sounds.  Pulmonary:     Effort: Pulmonary effort is  normal.     Breath sounds: Normal breath sounds.  Neurological:     Mental Status: She is alert and oriented to person, place, and time.     Sensory: No sensory deficit.     Coordination: Romberg sign positive. Coordination abnormal.     Gait: Gait abnormal.     BP 138/86   Pulse 60   Ht 5\' 5"  (1.651 m)   Wt 246 lb 0.6 oz (111.6 kg)   LMP 08/11/2014   SpO2 97%   BMI 40.94 kg/m  Wt  Readings from Last 3 Encounters:  03/15/23 246 lb 0.6 oz (111.6 kg)  01/10/23 231 lb 3 oz (104.9 kg)  01/01/23 234 lb (106.1 kg)    Lab Results  Component Value Date   TSH 0.489 02/09/2015   Lab Results  Component Value Date   WBC 6.6 01/01/2023   HGB 13.6 01/01/2023   HCT 38.8 01/01/2023   MCV 91.3 01/01/2023   PLT 221 01/01/2023   Lab Results  Component Value Date   NA 140 01/01/2023   K 4.2 01/01/2023   CO2 30 01/01/2023   GLUCOSE 88 01/01/2023   BUN 15 01/01/2023   CREATININE 0.69 01/01/2023   BILITOT 0.7 01/01/2023   ALKPHOS 54 10/23/2022   AST 16 01/01/2023   ALT 11 01/01/2023   PROT 7.1 01/01/2023   ALBUMIN 4.1 10/23/2022   CALCIUM 9.7 01/01/2023   ANIONGAP 5 10/23/2022   EGFR 106 01/01/2023   Lab Results  Component Value Date   CHOL 225 (H) 08/15/2022   Lab Results  Component Value Date   HDL 67 08/15/2022   Lab Results  Component Value Date   LDLCALC 131 (H) 08/15/2022   Lab Results  Component Value Date   TRIG 153 (H) 08/15/2022   Lab Results  Component Value Date   CHOLHDL 3.4 08/15/2022   Lab Results  Component Value Date   HGBA1C 5.1 08/15/2022      Assessment & Plan:  Injury of head, initial encounter Assessment & Plan: Will get a CT scan of the head today No lacerations or ecchymosis were seen at the site of her head injury Flexeril ordered with a short supply of Percocet   Orders: -     CT HEAD WO CONTRAST ( ) -     Cyclobenzaprine HCl; Take 1 tablet (5 mg total) by mouth 3 (three) times daily as needed for muscle spasms.  Dispense: 30 tablet; Refill: 1 -     oxyCODONE-Acetaminophen; Take 1 tablet by mouth every 4 (four) hours as needed for up to 5 days for severe pain.  Dispense: 15 tablet; Refill: 0  Bipolar disorder in partial remission, most recent episode unspecified type -     Ambulatory referral to Psychiatry  Other schizophrenia -     Ambulatory referral to Psychiatry    Follow-up: Return in about 3 months  (around 06/14/2023).   Gilmore Laroche, FNP

## 2023-03-15 NOTE — Assessment & Plan Note (Signed)
Will get a CT scan of the head today No lacerations or ecchymosis were seen at the site of her head injury Flexeril ordered with a short supply of Percocet

## 2023-03-16 ENCOUNTER — Other Ambulatory Visit (HOSPITAL_COMMUNITY): Payer: Self-pay

## 2023-03-16 ENCOUNTER — Encounter (HOSPITAL_COMMUNITY): Payer: Self-pay

## 2023-03-20 ENCOUNTER — Other Ambulatory Visit: Payer: Self-pay

## 2023-03-21 ENCOUNTER — Ambulatory Visit
Admission: RE | Admit: 2023-03-21 | Discharge: 2023-03-21 | Disposition: A | Discharge status: Home / Self Care | Payer: Medicaid Other | Source: Ambulatory Visit | Attending: Family Medicine | Admitting: Family Medicine

## 2023-03-21 DIAGNOSIS — S0990XA Unspecified injury of head, initial encounter: Secondary | ICD-10-CM | POA: Diagnosis present

## 2023-03-26 ENCOUNTER — Other Ambulatory Visit: Payer: Self-pay

## 2023-03-26 ENCOUNTER — Other Ambulatory Visit (HOSPITAL_COMMUNITY)
Admission: RE | Admit: 2023-03-26 | Discharge: 2023-03-26 | Disposition: A | Discharge status: Home / Self Care | Payer: Medicaid Other | Source: Ambulatory Visit | Attending: Internal Medicine | Admitting: Internal Medicine

## 2023-03-26 ENCOUNTER — Ambulatory Visit (INDEPENDENT_AMBULATORY_CARE_PROVIDER_SITE_OTHER): Payer: Medicaid Other | Admitting: Internal Medicine

## 2023-03-26 VITALS — BP 135/85 | HR 69 | Resp 16 | Ht 65.0 in | Wt 255.0 lb

## 2023-03-26 DIAGNOSIS — B20 Human immunodeficiency virus [HIV] disease: Secondary | ICD-10-CM | POA: Insufficient documentation

## 2023-03-26 DIAGNOSIS — Z8669 Personal history of other diseases of the nervous system and sense organs: Secondary | ICD-10-CM | POA: Diagnosis not present

## 2023-03-26 DIAGNOSIS — R1013 Epigastric pain: Secondary | ICD-10-CM

## 2023-03-26 DIAGNOSIS — Z7251 High risk heterosexual behavior: Secondary | ICD-10-CM | POA: Diagnosis not present

## 2023-03-26 DIAGNOSIS — F319 Bipolar disorder, unspecified: Secondary | ICD-10-CM

## 2023-03-26 DIAGNOSIS — Z79899 Other long term (current) drug therapy: Secondary | ICD-10-CM | POA: Diagnosis not present

## 2023-03-26 DIAGNOSIS — B351 Tinea unguium: Secondary | ICD-10-CM | POA: Diagnosis not present

## 2023-03-26 LAB — CBC WITH DIFFERENTIAL/PLATELET
Basophils Absolute: 19 cells/uL (ref 0–200)
Eosinophils Relative: 1.1 %
HCT: 36.1 % (ref 35.0–45.0)
MCH: 32.7 pg (ref 27.0–33.0)
RBC: 3.76 10*6/uL — ABNORMAL LOW (ref 3.80–5.10)

## 2023-03-26 MED ORDER — TOPIRAMATE 25 MG PO TABS
ORAL_TABLET | ORAL | 0 refills | Status: DC
  Filled 2023-03-26: qty 60, 19d supply, fill #0

## 2023-03-26 MED ORDER — METHOCARBAMOL 500 MG PO TABS
500.0000 mg | ORAL_TABLET | Freq: Four times a day (QID) | ORAL | 1 refills | Status: DC
  Filled 2023-03-26: qty 30, 8d supply, fill #0
  Filled 2023-04-02 – 2023-04-12 (×2): qty 30, 8d supply, fill #1

## 2023-03-26 MED ORDER — TERBINAFINE HCL 250 MG PO TABS
250.0000 mg | ORAL_TABLET | Freq: Every day | ORAL | 4 refills | Status: DC
  Filled 2023-03-26: qty 30, 30d supply, fill #0
  Filled 2023-05-16: qty 30, 30d supply, fill #1
  Filled 2023-06-15: qty 30, 30d supply, fill #2
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
## 2023-03-27 LAB — LIPASE: Lipase: 25 U/L (ref 7–60)

## 2023-03-27 LAB — URINE CYTOLOGY ANCILLARY ONLY
Chlamydia: NEGATIVE
Comment: NEGATIVE
Comment: NORMAL
Neisseria Gonorrhea: NEGATIVE

## 2023-03-27 LAB — COMPLETE METABOLIC PANEL WITH GFR
ALT: 7 U/L (ref 6–29)
Calcium: 9.1 mg/dL (ref 8.6–10.2)

## 2023-03-27 LAB — LIPID PANEL
HDL: 46 mg/dL — ABNORMAL LOW (ref >=50)
Non-HDL Cholesterol (Calc): 77 mg/dL (calc) (ref <130)
Triglycerides: 103 mg/dL (ref <150)

## 2023-03-28 MED ORDER — EPINEPHRINE 0.3 MG/0.3ML IJ SOAJ
0.3000 mg | Freq: Every day | INTRAMUSCULAR | 1 refills | Status: AC | PRN
  Filled 2023-03-28: qty 2, 30d supply, fill #0

## 2023-03-28 MED ORDER — ALBUTEROL SULFATE HFA 108 (90 BASE) MCG/ACT IN AERS
2.0000 | INHALATION_SPRAY | RESPIRATORY_TRACT | 2 refills | Status: AC | PRN
  Filled 2023-03-28: qty 18, 16d supply, fill #0
  Filled 2023-06-05: qty 18, 16d supply, fill #1

## 2023-03-29 ENCOUNTER — Other Ambulatory Visit: Payer: Self-pay

## 2023-03-29 ENCOUNTER — Other Ambulatory Visit (HOSPITAL_COMMUNITY): Payer: Self-pay

## 2023-03-29 LAB — LIPID PANEL
Cholesterol: 123 mg/dL (ref <200)
LDL Cholesterol (Calc): 58 mg/dL (calc)
Total CHOL/HDL Ratio: 2.7 (calc) (ref <5.0)

## 2023-03-29 LAB — CBC WITH DIFFERENTIAL/PLATELET
Absolute Monocytes: 498 cells/uL (ref 200–950)
Basophils Relative: 0.3 %
Eosinophils Absolute: 69 cells/uL (ref 15–500)
Hemoglobin: 12.3 g/dL (ref 11.7–15.5)
Lymphs Abs: 2344 cells/uL (ref 850–3900)
MCHC: 34.1 g/dL (ref 32.0–36.0)
MCV: 96 fL (ref 80.0–100.0)
MPV: 10.1 fL (ref 7.5–12.5)
Monocytes Relative: 7.9 %
Neutro Abs: 3371 cells/uL (ref 1500–7800)
Neutrophils Relative %: 53.5 %
Platelets: 184 10*3/uL (ref 140–400)
RDW: 12.5 % (ref 11.0–15.0)
Total Lymphocyte: 37.2 %
WBC: 6.3 10*3/uL (ref 3.8–10.8)

## 2023-03-29 LAB — COMPLETE METABOLIC PANEL WITH GFR
AG Ratio: 1.6 (calc) (ref 1.0–2.5)
AST: 11 U/L (ref 10–35)
Albumin: 3.9 g/dL (ref 3.6–5.1)
Alkaline phosphatase (APISO): 50 U/L (ref 31–125)
BUN: 8 mg/dL (ref 7–25)
CO2: 30 mmol/L (ref 20–32)
Chloride: 106 mmol/L (ref 98–110)
Creat: 0.69 mg/dL (ref 0.50–0.99)
Globulin: 2.5 g/dL (calc) (ref 1.9–3.7)
Glucose, Bld: 90 mg/dL (ref 65–99)
Potassium: 4.1 mmol/L (ref 3.5–5.3)
Sodium: 141 mmol/L (ref 135–146)
Total Bilirubin: 0.4 mg/dL (ref 0.2–1.2)
Total Protein: 6.4 g/dL (ref 6.1–8.1)
eGFR: 106 mL/min/{1.73_m2} (ref >=60)

## 2023-03-29 LAB — HIV-1 RNA QUANT-NO REFLEX-BLD
HIV 1 RNA Quant: NOT DETECTED Copies/mL
HIV-1 RNA Quant, Log: NOT DETECTED Log cps/mL

## 2023-03-29 LAB — RPR: RPR Ser Ql: NONREACTIVE

## 2023-03-30 ENCOUNTER — Other Ambulatory Visit (HOSPITAL_COMMUNITY): Payer: Self-pay

## 2023-04-02 ENCOUNTER — Ambulatory Visit: Payer: Medicaid Other | Admitting: Internal Medicine

## 2023-04-03 ENCOUNTER — Ambulatory Visit: Payer: Medicaid Other

## 2023-04-03 ENCOUNTER — Encounter: Payer: Self-pay | Admitting: Pharmacist

## 2023-04-03 ENCOUNTER — Other Ambulatory Visit (HOSPITAL_COMMUNITY): Payer: Self-pay

## 2023-04-03 ENCOUNTER — Other Ambulatory Visit: Payer: Self-pay

## 2023-04-04 ENCOUNTER — Other Ambulatory Visit (HOSPITAL_COMMUNITY): Payer: Self-pay

## 2023-04-09 ENCOUNTER — Other Ambulatory Visit: Payer: Self-pay

## 2023-04-12 ENCOUNTER — Other Ambulatory Visit (HOSPITAL_COMMUNITY): Payer: Self-pay

## 2023-04-12 ENCOUNTER — Other Ambulatory Visit: Payer: Self-pay | Admitting: Internal Medicine

## 2023-04-16 NOTE — Telephone Encounter (Signed)
Alright, no problem.

## 2023-04-16 NOTE — Telephone Encounter (Signed)
Kindly verify why the patient is on Topamax?

## 2023-04-23 ENCOUNTER — Other Ambulatory Visit: Payer: Self-pay

## 2023-04-23 ENCOUNTER — Telehealth: Payer: Self-pay

## 2023-04-23 ENCOUNTER — Other Ambulatory Visit (HOSPITAL_COMMUNITY): Payer: Self-pay

## 2023-04-23 ENCOUNTER — Encounter: Payer: Self-pay | Admitting: Pharmacist

## 2023-04-23 DIAGNOSIS — B9689 Other specified bacterial agents as the cause of diseases classified elsewhere: Secondary | ICD-10-CM

## 2023-04-23 MED ORDER — METRONIDAZOLE 500 MG PO TABS
500.0000 mg | ORAL_TABLET | Freq: Two times a day (BID) | ORAL | 0 refills | Status: DC
Start: 2023-04-23 — End: 2024-07-24
  Filled 2023-04-23: qty 14, 7d supply, fill #0

## 2023-04-23 NOTE — Telephone Encounter (Signed)
Patient called requesting prescription for BV be sent to [facility].   Per Dr. Drue Second, patient needs metronidazole 500mg  bid x 7 days. Prescription sent.   Sandie Ano, RN

## 2023-04-26 ENCOUNTER — Other Ambulatory Visit (HOSPITAL_COMMUNITY): Payer: Self-pay

## 2023-05-04 ENCOUNTER — Other Ambulatory Visit (HOSPITAL_COMMUNITY): Payer: Self-pay

## 2023-05-16 ENCOUNTER — Other Ambulatory Visit: Payer: Self-pay | Admitting: Nurse Practitioner

## 2023-05-16 ENCOUNTER — Other Ambulatory Visit: Payer: Self-pay

## 2023-05-16 ENCOUNTER — Other Ambulatory Visit (HOSPITAL_COMMUNITY): Payer: Self-pay

## 2023-05-16 ENCOUNTER — Other Ambulatory Visit: Payer: Self-pay | Admitting: Family Medicine

## 2023-05-16 DIAGNOSIS — F3170 Bipolar disorder, currently in remission, most recent episode unspecified: Secondary | ICD-10-CM

## 2023-05-16 MED ORDER — LAMOTRIGINE 200 MG PO TABS
200.0000 mg | ORAL_TABLET | Freq: Every day | ORAL | 3 refills | Status: DC
Start: 2023-05-16 — End: 2024-07-24
  Filled 2023-05-16: qty 30, 30d supply, fill #0
  Filled 2023-06-15: qty 30, 30d supply, fill #1

## 2023-05-16 MED ORDER — LINACLOTIDE 290 MCG PO CAPS
290.0000 ug | ORAL_CAPSULE | Freq: Every day | ORAL | 2 refills | Status: DC
  Filled 2023-05-16: qty 30, 30d supply, fill #0

## 2023-05-17 ENCOUNTER — Other Ambulatory Visit: Payer: Self-pay

## 2023-05-17 ENCOUNTER — Other Ambulatory Visit (HOSPITAL_COMMUNITY): Payer: Self-pay

## 2023-05-30 ENCOUNTER — Other Ambulatory Visit (HOSPITAL_COMMUNITY): Payer: Self-pay

## 2023-05-30 ENCOUNTER — Telehealth: Payer: Self-pay

## 2023-05-30 NOTE — Telephone Encounter (Signed)
Patient called office stating she has BV. For the past two days has been experiencing vaginal discharge. Discharge is white in color, and has a smell.  Advised she contact PCP for appointment for testing and prescription. Is not set up with OBGYN, but would like referral.  Is using [facility] pharmacy if provider is okay with sending in refill for Metronidazole.  Juanita Laster, RMA

## 2023-06-01 ENCOUNTER — Other Ambulatory Visit (HOSPITAL_COMMUNITY): Payer: Self-pay

## 2023-06-04 ENCOUNTER — Other Ambulatory Visit (HOSPITAL_COMMUNITY): Payer: Self-pay

## 2023-06-05 ENCOUNTER — Other Ambulatory Visit (HOSPITAL_COMMUNITY): Payer: Self-pay

## 2023-06-05 ENCOUNTER — Encounter: Payer: Self-pay | Admitting: Pharmacist

## 2023-06-05 ENCOUNTER — Other Ambulatory Visit: Payer: Self-pay

## 2023-06-11 ENCOUNTER — Other Ambulatory Visit: Payer: Self-pay

## 2023-06-14 ENCOUNTER — Ambulatory Visit: Payer: Medicaid Other | Admitting: Family Medicine

## 2023-06-15 ENCOUNTER — Other Ambulatory Visit: Payer: Self-pay | Admitting: Nurse Practitioner

## 2023-06-15 ENCOUNTER — Other Ambulatory Visit: Payer: Self-pay | Admitting: Internal Medicine

## 2023-06-15 ENCOUNTER — Telehealth: Payer: Self-pay

## 2023-06-15 ENCOUNTER — Other Ambulatory Visit (HOSPITAL_COMMUNITY): Payer: Self-pay

## 2023-06-15 ENCOUNTER — Other Ambulatory Visit: Payer: Self-pay

## 2023-06-15 DIAGNOSIS — B20 Human immunodeficiency virus [HIV] disease: Secondary | ICD-10-CM

## 2023-06-15 MED ORDER — ACYCLOVIR 400 MG PO TABS
400.0000 mg | ORAL_TABLET | Freq: Two times a day (BID) | ORAL | 3 refills | Status: DC | PRN
Start: 2023-06-15 — End: 2024-07-24
  Filled 2023-06-15: qty 180, 90d supply, fill #0
  Filled 2023-06-15: qty 180, 90d supply, fill #1

## 2023-06-15 NOTE — Telephone Encounter (Signed)
Received call from [facility] Case Manager stating patient requested refill on Acyclovir. Would like refill sent to [facility]. Would like call back on refill has been approved. P: [phone] Juanita Laster, RMA

## 2023-06-15 NOTE — Telephone Encounter (Signed)
Called Case Manager to inform her prescription has been filled.  Will update patient. Juanita Laster, RMA

## 2023-06-15 NOTE — Telephone Encounter (Signed)
Patient has a primary care provider.   Sandie Ano, RN

## 2023-06-16 ENCOUNTER — Other Ambulatory Visit (HOSPITAL_COMMUNITY): Payer: Self-pay

## 2023-06-16 MED ORDER — CROMOLYN SODIUM 4 % OP SOLN
1.0000 [drp] | Freq: Every day | OPHTHALMIC | 3 refills | Status: AC
  Filled 2023-06-16: qty 10, 30d supply, fill #0

## 2023-06-18 ENCOUNTER — Other Ambulatory Visit (HOSPITAL_COMMUNITY): Payer: Self-pay

## 2023-06-18 ENCOUNTER — Other Ambulatory Visit: Payer: Self-pay

## 2023-06-20 ENCOUNTER — Ambulatory Visit (INDEPENDENT_AMBULATORY_CARE_PROVIDER_SITE_OTHER): Payer: MEDICAID | Admitting: Family Medicine

## 2023-06-20 ENCOUNTER — Encounter: Payer: Self-pay | Admitting: Family Medicine

## 2023-06-20 VITALS — BP 136/86 | HR 71 | Ht 65.0 in | Wt 242.8 lb

## 2023-06-20 DIAGNOSIS — E7849 Other hyperlipidemia: Secondary | ICD-10-CM

## 2023-06-20 DIAGNOSIS — I10 Essential (primary) hypertension: Secondary | ICD-10-CM | POA: Diagnosis not present

## 2023-06-20 DIAGNOSIS — Z9889 Other specified postprocedural states: Secondary | ICD-10-CM

## 2023-06-20 DIAGNOSIS — R7301 Impaired fasting glucose: Secondary | ICD-10-CM | POA: Diagnosis not present

## 2023-06-20 DIAGNOSIS — E559 Vitamin D deficiency, unspecified: Secondary | ICD-10-CM

## 2023-06-20 DIAGNOSIS — M549 Dorsalgia, unspecified: Secondary | ICD-10-CM

## 2023-06-20 DIAGNOSIS — E038 Other specified hypothyroidism: Secondary | ICD-10-CM

## 2023-06-20 DIAGNOSIS — Z1231 Encounter for screening mammogram for malignant neoplasm of breast: Secondary | ICD-10-CM

## 2023-06-20 MED ORDER — OXYCODONE-ACETAMINOPHEN 5-325 MG PO TABS
1.0000 | ORAL_TABLET | Freq: Three times a day (TID) | ORAL | 0 refills | Status: AC | PRN
Start: 2023-06-20 — End: 2023-06-25

## 2023-06-20 NOTE — Patient Instructions (Addendum)
I appreciate the opportunity to provide care to you today!    Follow up:  1 month for pap smear  Labs: please stop by the lab during the week to get your blood drawn (CBC, CMP, TSH, Lipid profile, HgA1c, Vit D)   Please pick up your prescription at the pharmacy  Please stop by your local pharmacy and get your Shingles vaccine  Referrals today-  Neurosurgery  Attached with your AVS, you will find valuable resources for self-education. I highly recommend dedicating some time to thoroughly examine them.   Please continue to a heart-healthy diet and increase your physical activities. Try to exercise for at least five days a week.    It was a pleasure to see you and I look forward to continuing to work together on your health and well-being. Please do not hesitate to call the office if you need care or have questions about your care.  In case of emergency, please visit the Emergency Department for urgent care, or contact our clinic at [phone] to schedule an appointment. We're here to help you!   Have a wonderful day and week. With Gratitude, Gilmore Laroche MSN, FNP-BC

## 2023-06-20 NOTE — Progress Notes (Signed)
Hello  Established Patient Office Visit  Subjective:  Patient ID: Rachel Underwood, female    DOB: [birth year]  Age: 52 y.o. MRN: [id number]  CC:  Chief Complaint  Patient presents with   Chronic Care Management    3 month f/u   Back Pain    Pt reports back pain still present pain level is a 6 out 10 would like a referral to have a device put in states she does not like oral medication.     HPI Rachel Underwood is a 52 y.o. female with past medical history of hypertension, GERD and back pain with history of spinal surgery presents for f/u of  chronic medical conditions. For the details of today's visit, please refer to the assessment and plan.     Past Medical History:  Diagnosis Date   Abnormal Pap smear of cervix 2005(EST)   ckc for abnl pap- [facility] [place]   Allergy    Asthma    Bipolar 1 disorder (HCC)    Borderline personality disorder (HCC)    BPD (bronchopulmonary dysplasia)    Carpal tunnel syndrome    bilateral wrist   Depression    Dysmenorrhea 06/03/2014   Enlarged uterus 06/03/2014   Fibromyalgia    GERD (gastroesophageal reflux disease)    Herpes simplex without mention of complication    HIV (human immunodeficiency virus infection) (HCC)    Hyperlipidemia    Hypertension    Menorrhagia with irregular cycle 06/03/2014   Osteoarthritis    Seizures (HCC)    had as a teenager, unknown etiology and no meds.    Past Surgical History:  Procedure Laterality Date   ABDOMINAL HYSTERECTOMY N/A 09/01/2014   Procedure: HYSTERECTOMY ABDOMINAL;  Surgeon: Tilda Burrow, MD;  Location: [facility];  Service: Gynecology;  Laterality: N/A;   ANKLE SURGERY Right    repair of ligaments   BACK SURGERY     BILATERAL SALPINGECTOMY Bilateral 09/01/2014   Procedure: BILATERAL SALPINGECTOMY;  Surgeon: Tilda Burrow, MD;  Location: [facility];  Service: Gynecology;  Laterality: Bilateral;   CESAREAN SECTION     ckc     COLONOSCOPY N/A 12/23/2014   MWN:UUVO canal hemorrhoids left-sided  diverticula   ESOPHAGOGASTRODUODENOSCOPY N/A 12/23/2014   ZDG:UYQIHKV reflux/noncritical shatzkis ring s/p dilation/small HH   EXCISION OF SKIN TAG N/A 09/01/2014   Procedure: EXCISION OF SKIN NEVUS;  Surgeon: Tilda Burrow, MD;  Location: [facility];  Service: Gynecology;  Laterality: N/A;   FRACTURE SURGERY     MALONEY DILATION N/A 12/23/2014   Procedure: Elease Hashimoto DILATION;  Surgeon: Corbin Ade, MD;  [facility]: [facility];  Service: Endoscopy;  Laterality: N/A;   SAVORY DILATION N/A 12/23/2014   Procedure: SAVORY DILATION;  Surgeon: Corbin Ade, MD;  Location: [facility];  Service: Endoscopy;  Laterality: N/A;   SCAR REVISION N/A 09/01/2014   Procedure: EXCISION OF CICATRIX;  Surgeon: Tilda Burrow, MD;  Location: [facility];  Service: Gynecology;  Laterality: N/A;   SHOULDER SURGERY Right    rotator cuff   SPINE SURGERY      Family History  Problem Relation Age of Onset   Fibroids Mother    Hypertension Mother    Depression Mother    Irritable bowel syndrome Mother    Arthritis Mother    Heart disease Mother    Mental illness Mother    Stroke Mother    Vision loss Mother    Lupus Mother    Hypertension Father  Mental illness Father    Arthritis Father    Heart disease Father    Vision loss Father    Heart disease Maternal Grandmother    Cancer Maternal Grandmother        breast   Depression Maternal Grandmother    Hyperlipidemia Maternal Grandmother    Hypertension Maternal Grandmother    Bipolar disorder Daughter    Schizophrenia Daughter    Lupus Daughter    Cancer Paternal Grandfather        prostate   Heart disease Paternal Grandfather    Hyperlipidemia Paternal Grandfather    Hypertension Paternal Grandfather    Dementia Paternal Grandmother    Hyperlipidemia Paternal Grandmother    Hypertension Paternal Grandmother    Heart disease Maternal Grandfather    Alzheimer's disease Maternal Grandfather    Hyperlipidemia Maternal Grandfather    Hypertension  Maternal Grandfather    Anxiety disorder Daughter    ADD / ADHD Son    Hypertension Brother    Early death Brother    Learning disabilities Brother    Mental illness Brother    Colon cancer Neg Hx     Social History   Socioeconomic History   Marital status: Divorced    Spouse name: Not on file   Number of children: 3   Years of education: Not on file   Highest education level: Not on file  Occupational History   Not on file  Tobacco Use   Smoking status: Every Day    Types: E-cigarettes   Smokeless tobacco: Never   Tobacco comments:    States she is cutting back  Vaping Use   Vaping status: Never Used  Substance and Sexual Activity   Alcohol use: Not Currently    Comment: Very arely   Drug use: No    Types: "Crack" cocaine    Comment: last cocaine use 11/2022   Sexual activity: Yes    Partners: Male    Birth control/protection: Condom    Comment: given condoms  Other Topics Concern   Not on file  Social History Narrative   Lives with a friend at the moment    Social Determinants of Health   Financial Resource Strain: Not on file  Food Insecurity: Not on file  Transportation Needs: Not on file  Physical Activity: Inactive (06/01/2022)   Exercise Vital Sign    Days of Exercise per Week: 0 days    Minutes of Exercise per Session: 0 min  Stress: Stress Concern Present (06/01/2022)   [employer] of Occupational [employer] - Occupational Stress Questionnaire    Feeling of Stress : To some extent  Social Connections: Unknown (04/18/2022)   [facility] from [facility]   Social Network    Social Network: Not on file  Intimate Partner Violence: Unknown (03/08/2022)   Received from [facility]   HITS    Physically Hurt: Not on file    Insult or Talk Down To: Not on file    Threaten Physical Harm: Not on file    Scream or Curse: Not on file    Outpatient Medications Prior to Visit  Medication Sig Dispense Refill   acyclovir (ZOVIRAX) 400 MG tablet Take 1  tablet (400 mg total) by mouth 2 (two) times daily as needed for prophylaxis 180 tablet 3   albuterol (VENTOLIN HFA) 108 (90 Base) MCG/ACT inhaler INHALE 2 PUFFS INTO THE LUNGS EVERY 4 HOURS AS NEEDED FOR WHEEZING OR SHORTNESS OF BREATH. 18 g 2   azelastine (ASTELIN)  0.1 % nasal spray Place 1 spray into both nostrils 2 (two) times daily. 30 mL 12   bictegravir-emtricitabine-tenofovir AF (BIKTARVY) 50-200-25 MG TABS tablet TAKE 1 TABLET BY MOUTH DAILY. 30 tablet 11   cromolyn (OPTICROM) 4 % ophthalmic solution Place 1 drop into both eyes daily. 10 mL 3   cyclobenzaprine (FLEXERIL) 5 MG tablet Take 1 tablet (5 mg total) by mouth 3 (three) times daily as needed for muscle spasms. 30 tablet 1   cycloSPORINE (RESTASIS) 0.05 % ophthalmic emulsion Place 1 drop into both eyes 2 (two) times daily. 180 each 2   Ensure (ENSURE) Take 237 mLs by mouth 2 (two) times daily between meals. 237 mL 6   EPINEPHrine (EPIPEN 2-PAK) 0.3 mg/0.3 mL IJ SOAJ injection Inject 1 pen (0.3 mg) into the skin as directed as needed for anaphylaxis. 2 each 1   ibuprofen (ADVIL) 800 MG tablet Take 1 tablet (800 mg total) by mouth every 8 (eight) hours as needed. 30 tablet 0   lamoTRIgine (LAMICTAL) 200 MG tablet Take 1 tablet (200 mg total) by mouth daily. 30 tablet 3   linaclotide (LINZESS) 290 MCG CAPS capsule Take 1 capsule by mouth daily on empty stomach 30 capsule 2   methocarbamol (ROBAXIN) 500 MG tablet Take 1 tablet (500 mg total) by mouth 4 (four) times daily. 30 tablet 1   metroNIDAZOLE (FLAGYL) 500 MG tablet Take 1 tablet (500 mg total) by mouth 2 (two) times daily for 7 days 14 tablet 0   montelukast (SINGULAIR) 10 MG tablet Take 1 tablet (10 mg) by mouth at bedtime. 30 tablet 3   nystatin (MYCOSTATIN/NYSTOP) powder Apply topically 3 times daily for up to 2 weeks for rash under breast. 30 g 1   ondansetron (ZOFRAN-ODT) 4 MG disintegrating tablet Dissolve 1 tablet (4 mg total) by mouth 3 (three) times daily as needed. 20  tablet 3   propranolol (INDERAL) 20 MG tablet Take 1 tablet (20 mg total) by mouth 2 (two) times daily. 60 tablet 6   rosuvastatin (CRESTOR) 10 MG tablet Take 1 tablet (10 mg total) by mouth daily. 30 tablet 11   terbinafine (LAMISIL) 250 MG tablet Take 1 tablet (250 mg total) by mouth daily. 30 tablet 4   benzonatate (TESSALON) 100 MG capsule Take 1 capsule (100 mg total) by mouth 2 (two) times daily as needed for cough. 20 capsule 0   fluconazole (DIFLUCAN) 150 MG tablet Take 1 tablet (150 mg total) by mouth daily. Every 3-4 days if still symptomatic 4 tablet 0   topiramate (TOPAMAX) 25 MG tablet Take 1 tablet (25 mg total) by mouth 2 (two) times daily for 7 days, THEN 2 tablets (50 mg total) 2 (two) times daily. 60 tablet 0   No facility-administered medications prior to visit.    Allergies  Allergen Reactions   Flecainide Swelling   Piroxicam Anaphylaxis   Abilify [Aripiprazole]    Citalopram Hives   Codeine Nausea And Vomiting   Depakote [Divalproex Sodium] Swelling   Gabapentin     Hives    Haldol [Haloperidol Lactate] Other (See Comments)    Slurs speech and "stumble"   Haloperidol    Indomethacin Swelling    Dizzy, tigling of face   Invega [Paliperidone] Other (See Comments)    Slurred speech, drooling   Latuda [Lurasidone Hcl] Itching   Lithium Hives   Lurasidone    Promethazine Swelling    Arms and hands   Trazodone And Nefazodone Other (See Comments)  Dry mouth, congestion, severe dryness.   Lexapro [Escitalopram Oxalate] Rash    ROS Review of Systems  Constitutional:  Negative for chills and fever.  Eyes:  Negative for visual disturbance.  Respiratory:  Negative for chest tightness and shortness of breath.   Neurological:  Negative for dizziness and headaches.      Objective:    Physical Exam HENT:     Head: Normocephalic.     Mouth/Throat:     Mouth: Mucous membranes are moist.  Cardiovascular:     Rate and Rhythm: Normal rate.     Heart sounds:  Normal heart sounds.  Pulmonary:     Effort: Pulmonary effort is normal.     Breath sounds: Normal breath sounds.  Musculoskeletal:     Lumbar back: Tenderness present.  Neurological:     Mental Status: She is alert.     BP 136/86   Pulse 71   Ht 5\' 5"  (1.651 m)   Wt 242 lb 12.8 oz (110.1 kg)   LMP 08/11/2014   SpO2 97%   BMI 40.40 kg/m  Wt Readings from Last 3 Encounters:  06/20/23 242 lb 12.8 oz (110.1 kg)  03/26/23 255 lb (115.7 kg)  03/15/23 246 lb 0.6 oz (111.6 kg)    Lab Results  Component Value Date   TSH 0.489 02/09/2015   Lab Results  Component Value Date   WBC 6.3 03/26/2023   HGB 12.3 03/26/2023   HCT 36.1 03/26/2023   MCV 96.0 03/26/2023   PLT 184 03/26/2023   Lab Results  Component Value Date   NA 141 03/26/2023   K 4.1 03/26/2023   CO2 30 03/26/2023   GLUCOSE 90 03/26/2023   BUN 8 03/26/2023   CREATININE 0.69 03/26/2023   BILITOT 0.4 03/26/2023   ALKPHOS 54 10/23/2022   AST 11 03/26/2023   ALT 7 03/26/2023   PROT 6.4 03/26/2023   ALBUMIN 4.1 10/23/2022   CALCIUM 9.1 03/26/2023   ANIONGAP 5 10/23/2022   EGFR 106 03/26/2023   Lab Results  Component Value Date   CHOL 123 03/26/2023   Lab Results  Component Value Date   HDL 46 (L) 03/26/2023   Lab Results  Component Value Date   LDLCALC 58 03/26/2023   Lab Results  Component Value Date   TRIG 103 03/26/2023   Lab Results  Component Value Date   CHOLHDL 2.7 03/26/2023   Lab Results  Component Value Date   HGBA1C 5.1 08/15/2022      Assessment & Plan:  Back pain with history of spinal surgery Assessment & Plan: Hx of back surgery Was told she could not get another surgery on the back, as it would leave her paralyzed Has chronic back pain Pain is rated 8/10 Unrelieved by otc analgesic and muscle relaxant No red flag symptoms with back pain Would like a referral for a spinal stimulator implant  Referral placed to neurosurgery Will provide a short course of narcotics to  help with pain management Encouraged supportive care with rest, heat application and low back exercises     Orders: -     Ambulatory referral to Neurosurgery -     oxyCODONE-Acetaminophen; Take 1 tablet by mouth every 8 (eight) hours as needed for up to 5 days for severe pain.  Dispense: 15 tablet; Refill: 0  Primary hypertension Assessment & Plan: Controlled Asymptomatic in the clinic She takes losartan 50 mg daily, propanolol 10 mg 2 times daily DASH diet advised engage in regular vigorous exercise  with at least 150 minutes weekly as tolerated BP Readings from Last 3 Encounters:  06/20/23 136/86  03/26/23 135/85  03/15/23 138/86      Breast cancer screening by mammogram -     3D Screening Mammogram, Left and Right  IFG (impaired fasting glucose) -     Hemoglobin A1c  Vitamin D deficiency -     VITAMIN D 25 Hydroxy (Vit-D Deficiency, Fractures)  Other specified hypothyroidism -     TSH + free T4  Other hyperlipidemia -     Lipid panel -     CMP14+EGFR -     CBC with Differential/Platelet  Note: This chart has been completed using Engineer, civil (consulting) software, and while attempts have been made to ensure accuracy, certain words and phrases may not be transcribed as intended.    Follow-up: Return in about 1 month (around 07/21/2023) for pap smear.   Gilmore Laroche, FNP

## 2023-06-21 ENCOUNTER — Other Ambulatory Visit: Payer: Self-pay

## 2023-06-21 NOTE — Assessment & Plan Note (Signed)
Hx of back surgery Was told she could not get another surgery on the back, as it would leave her paralyzed Has chronic back pain Pain is rated 8/10 Unrelieved by otc analgesic and muscle relaxant No red flag symptoms with back pain Would like a referral for a spinal stimulator implant  Referral placed to neurosurgery Will provide a short course of narcotics to help with pain management Encouraged supportive care with rest, heat application and low back exercises

## 2023-06-22 NOTE — Assessment & Plan Note (Addendum)
Controlled Asymptomatic in the clinic She takes losartan 50 mg daily, propanolol 10 mg 2 times daily DASH diet advised engage in regular vigorous exercise with at least 150 minutes weekly as tolerated BP Readings from Last 3 Encounters:  06/20/23 136/86  03/26/23 135/85  03/15/23 138/86

## 2023-06-23 ENCOUNTER — Other Ambulatory Visit (HOSPITAL_COMMUNITY): Payer: Self-pay

## 2023-06-23 MED ORDER — LAMOTRIGINE 100 MG PO TABS
100.0000 mg | ORAL_TABLET | Freq: Every evening | ORAL | 1 refills | Status: DC
  Filled 2023-06-23: qty 30, 30d supply, fill #0

## 2023-06-23 MED ORDER — LAMOTRIGINE 200 MG PO TABS
200.0000 mg | ORAL_TABLET | ORAL | 1 refills | Status: DC
  Filled 2023-06-23: qty 30, 30d supply, fill #0

## 2023-06-23 MED ORDER — VRAYLAR 3 MG PO CAPS: 3.0000 mg | ORAL_CAPSULE | Freq: Every evening | ORAL | 0 refills | Status: DC

## 2023-06-23 MED ORDER — ESZOPICLONE 1 MG PO TABS
1.0000 mg | ORAL_TABLET | Freq: Every evening | ORAL | 1 refills | Status: DC
Start: 2023-06-22 — End: 2024-10-16
  Filled 2023-06-23 – 2023-06-25 (×2): qty 15, 30d supply, fill #0

## 2023-06-23 MED ORDER — LAMOTRIGINE 100 MG PO TABS: 100.0000 mg | ORAL_TABLET | Freq: Every evening | ORAL | 1 refills | Status: DC

## 2023-06-23 MED ORDER — LAMOTRIGINE 200 MG PO TABS: 200.0000 mg | ORAL_TABLET | ORAL | 1 refills | Status: DC

## 2023-06-25 ENCOUNTER — Other Ambulatory Visit: Payer: Self-pay

## 2023-06-25 ENCOUNTER — Other Ambulatory Visit (HOSPITAL_COMMUNITY): Payer: Self-pay

## 2023-06-25 ENCOUNTER — Ambulatory Visit: Payer: MEDICAID | Admitting: Internal Medicine

## 2023-06-26 ENCOUNTER — Other Ambulatory Visit: Payer: Self-pay

## 2023-06-27 ENCOUNTER — Ambulatory Visit (HOSPITAL_COMMUNITY): Payer: MEDICAID

## 2023-06-29 ENCOUNTER — Other Ambulatory Visit: Payer: Self-pay

## 2023-07-09 NOTE — Progress Notes (Deleted)
Referring Physician:  Gilmore Laroche, FNP [address] [place],  [place] [address]  Primary Physician:  Gilmore Laroche, FNP  History of Present Illness: 07/09/2023 Ms. Rachel Underwood is here today with a chief complaint of ***   Pt reports back pain still present pain level is a 6 out 10 would like a referral to have a device put in states she does not like oral medication.     Duration: *** Location: *** Quality: *** Severity: ***  Precipitating: aggravated by *** Modifying factors: made better by *** Weakness: none Timing: *** Bowel/Bladder Dysfunction: none  Conservative measures:  Physical therapy: ***  Multimodal medical therapy including regular antiinflammatories: ***  Injections: *** epidural steroid injections  Past Surgery: ***  Cambreigh Rehman has ***no symptoms of cervical myelopathy.  The symptoms are causing a significant impact on the patient's life.   Review of Systems:  A 10 point review of systems is negative, except for the pertinent positives and negatives detailed in the HPI.  Past Medical History: Past Medical History:  Diagnosis Date   Abnormal Pap smear of cervix 2005(EST)   ckc for abnl pap- [facility] [place]   Allergy    Asthma    Bipolar 1 disorder (HCC)    Borderline personality disorder (HCC)    BPD (bronchopulmonary dysplasia)    Carpal tunnel syndrome    bilateral wrist   Depression    Dysmenorrhea 06/03/2014   Enlarged uterus 06/03/2014   Fibromyalgia    GERD (gastroesophageal reflux disease)    Herpes simplex without mention of complication    HIV (human immunodeficiency virus infection) (HCC)    Hyperlipidemia    Hypertension    Menorrhagia with irregular cycle 06/03/2014   Osteoarthritis    Seizures (HCC)    had as a teenager, unknown etiology and no meds.    Past Surgical History: Past Surgical History:  Procedure Laterality Date   ABDOMINAL HYSTERECTOMY N/A 09/01/2014   Procedure: HYSTERECTOMY ABDOMINAL;   Surgeon: Tilda Burrow, MD;  Location: [facility];  Service: Gynecology;  Laterality: N/A;   ANKLE SURGERY Right    repair of ligaments   BACK SURGERY     BILATERAL SALPINGECTOMY Bilateral 09/01/2014   Procedure: BILATERAL SALPINGECTOMY;  Surgeon: Tilda Burrow, MD;  Location: [facility];  Service: Gynecology;  Laterality: Bilateral;   CESAREAN SECTION     ckc     COLONOSCOPY N/A 12/23/2014   ONG:EXBM canal hemorrhoids left-sided diverticula   ESOPHAGOGASTRODUODENOSCOPY N/A 12/23/2014   WUX:LKGMWNU reflux/noncritical shatzkis ring s/p dilation/small HH   EXCISION OF SKIN TAG N/A 09/01/2014   Procedure: EXCISION OF SKIN NEVUS;  Surgeon: Tilda Burrow, MD;  Location: [facility];  Service: Gynecology;  Laterality: N/A;   FRACTURE SURGERY     MALONEY DILATION N/A 12/23/2014   Procedure: Elease Hashimoto DILATION;  Surgeon: Corbin Ade, MD;  [facility];  Service: Endoscopy;  Laterality: N/A;   SAVORY DILATION N/A 12/23/2014   Procedure: SAVORY DILATION;  Surgeon: Corbin Ade, MD;  [facility];  Service: Endoscopy;  Laterality: N/A;   SCAR REVISION N/A 09/01/2014   Procedure: EXCISION OF CICATRIX;  Surgeon: Tilda Burrow, MD;  Location: [facility];  Service: Gynecology;  Laterality: N/A;   SHOULDER SURGERY Right    rotator cuff   SPINE SURGERY      Allergies: Allergies as of 07/17/2023 - Review Complete 06/20/2023  Allergen Reaction Noted   Flecainide Swelling 03/08/2016  Piroxicam Anaphylaxis 05/12/2015   Abilify [aripiprazole]  05/12/2015   Citalopram Hives 05/12/2015   Codeine Nausea And Vomiting 03/25/2014   Depakote [divalproex sodium] Swelling 05/12/2015   Gabapentin  05/13/2014   Haldol [haloperidol lactate] Other (See Comments) 03/25/2014   Haloperidol  09/06/2020   Indomethacin Swelling 12/11/2014   Invega [paliperidone] Other (See Comments) 06/10/2014   Latuda [lurasidone hcl] Itching 05/12/2015   Lithium Hives 06/03/2014   Lurasidone  09/06/2020   Promethazine  Swelling 02/03/2015   Trazodone and nefazodone Other (See Comments) 12/18/2014   Lexapro [escitalopram oxalate] Rash 07/15/2013    Medications: Outpatient Encounter Medications as of 07/17/2023  Medication Sig   acyclovir (ZOVIRAX) 400 MG tablet Take 1 tablet (400 mg total) by mouth 2 (two) times daily as needed for prophylaxis   albuterol (VENTOLIN HFA) 108 (90 Base) MCG/ACT inhaler INHALE 2 PUFFS INTO THE LUNGS EVERY 4 HOURS AS NEEDED FOR WHEEZING OR SHORTNESS OF BREATH.   azelastine (ASTELIN) 0.1 % nasal spray Place 1 spray into both nostrils 2 (two) times daily.   bictegravir-emtricitabine-tenofovir AF (BIKTARVY) 50-200-25 MG TABS tablet TAKE 1 TABLET BY MOUTH DAILY.   cariprazine (VRAYLAR) 3 MG capsule Take 1 capsule (3 mg total) by mouth every evening.   cromolyn (OPTICROM) 4 % ophthalmic solution Place 1 drop into both eyes daily.   cyclobenzaprine (FLEXERIL) 5 MG tablet Take 1 tablet (5 mg total) by mouth 3 (three) times daily as needed for muscle spasms.   cycloSPORINE (RESTASIS) 0.05 % ophthalmic emulsion Place 1 drop into both eyes 2 (two) times daily.   Ensure (ENSURE) Take 237 mLs by mouth 2 (two) times daily between meals.   EPINEPHrine (EPIPEN 2-PAK) 0.3 mg/0.3 mL IJ SOAJ injection Inject 1 pen (0.3 mg) into the skin as directed as needed for anaphylaxis.   eszopiclone (LUNESTA) 1 MG TABS tablet Take 1 tablet (1 mg total) by mouth at bedtime, for sleep.   ibuprofen (ADVIL) 800 MG tablet Take 1 tablet (800 mg total) by mouth every 8 (eight) hours as needed.   lamoTRIgine (LAMICTAL) 100 MG tablet Take 1 tablet (100 mg total) by mouth every evening.   lamoTRIgine (LAMICTAL) 100 MG tablet Take 1 tablet (100 mg total) by mouth every evening.   lamoTRIgine (LAMICTAL) 200 MG tablet Take 1 tablet (200 mg total) by mouth daily.   lamoTRIgine (LAMICTAL) 200 MG tablet Take 1 tablet (200 mg total) by mouth every morning.   lamoTRIgine (LAMICTAL) 200 MG tablet Take 1 tablet (200 mg total) by  mouth every morning.   linaclotide (LINZESS) 290 MCG CAPS capsule Take 1 capsule by mouth daily on empty stomach   methocarbamol (ROBAXIN) 500 MG tablet Take 1 tablet (500 mg total) by mouth 4 (four) times daily.   metroNIDAZOLE (FLAGYL) 500 MG tablet Take 1 tablet (500 mg total) by mouth 2 (two) times daily for 7 days   montelukast (SINGULAIR) 10 MG tablet Take 1 tablet (10 mg) by mouth at bedtime.   nystatin (MYCOSTATIN/NYSTOP) powder Apply topically 3 times daily for up to 2 weeks for rash under breast.   ondansetron (ZOFRAN-ODT) 4 MG disintegrating tablet Dissolve 1 tablet (4 mg total) by mouth 3 (three) times daily as needed.   propranolol (INDERAL) 20 MG tablet Take 1 tablet (20 mg total) by mouth 2 (two) times daily.   rosuvastatin (CRESTOR) 10 MG tablet Take 1 tablet (10 mg total) by mouth daily.   terbinafine (LAMISIL) 250 MG tablet Take 1 tablet (250 mg total) by mouth  daily.   topiramate (TOPAMAX) 25 MG tablet Take 1 tablet (25 mg total) by mouth 2 (two) times daily for 7 days, THEN 2 tablets (50 mg total) 2 (two) times daily.   No facility-administered encounter medications on file as of 07/17/2023.    Social History: Social History   Tobacco Use   Smoking status: Every Day    Types: E-cigarettes   Smokeless tobacco: Never   Tobacco comments:    States she is cutting back  Vaping Use   Vaping status: Never Used  Substance Use Topics   Alcohol use: Not Currently    Comment: Very arely   Drug use: No    Types: "Crack" cocaine    Comment: last cocaine use 11/2022    Family Medical History: Family History  Problem Relation Age of Onset   Fibroids Mother    Hypertension Mother    Depression Mother    Irritable bowel syndrome Mother    Arthritis Mother    Heart disease Mother    Mental illness Mother    Stroke Mother    Vision loss Mother    Lupus Mother    Hypertension Father    Mental illness Father    Arthritis Father    Heart disease Father    Vision loss  Father    Heart disease Maternal Grandmother    Cancer Maternal Grandmother        breast   Depression Maternal Grandmother    Hyperlipidemia Maternal Grandmother    Hypertension Maternal Grandmother    Bipolar disorder Daughter    Schizophrenia Daughter    Lupus Daughter    Cancer Paternal Grandfather        prostate   Heart disease Paternal Grandfather    Hyperlipidemia Paternal Grandfather    Hypertension Paternal Grandfather    Dementia Paternal Grandmother    Hyperlipidemia Paternal Grandmother    Hypertension Paternal Grandmother    Heart disease Maternal Grandfather    Alzheimer's disease Maternal Grandfather    Hyperlipidemia Maternal Grandfather    Hypertension Maternal Grandfather    Anxiety disorder Daughter    ADD / ADHD Son    Hypertension Brother    Early death Brother    Learning disabilities Brother    Mental illness Brother    Colon cancer Neg Hx     Physical Examination: @VITALWITHPAIN @  General: Patient is well developed, well nourished, calm, collected, and in no apparent distress. Attention to examination is appropriate.  Psychiatric: Patient is non-anxious.  Head:  Pupils equal, round, and reactive to light.  ENT:  Oral mucosa appears well hydrated.  Neck:   Supple.  ***Full range of motion.  Respiratory: Patient is breathing without any difficulty.  Extremities: No edema.  Vascular: Palpable dorsal pedal pulses.  Skin:   On exposed skin, there are no abnormal skin lesions.  NEUROLOGICAL:     Awake, alert, oriented to person, place, and time.  Speech is clear and fluent. Fund of knowledge is appropriate.   Cranial Nerves: Pupils equal round and reactive to light.  Facial tone is symmetric.  Facial sensation is symmetric.  ROM of spine: ***full.  Palpation of spine: ***non tender.    Strength: Side Biceps Triceps Deltoid Interossei Grip Wrist Ext. Wrist Flex.  R 5 5 5 5 5 5 5   L 5 5 5 5 5 5 5    Side Iliopsoas Quads Hamstring PF DF  EHL  R 5 5 5 5 5 5   L 5 5 5  5 5 5    Reflexes are ***2+ and symmetric at the biceps, triceps, brachioradialis, patella and achilles.   Hoffman's is absent.  Clonus is not present.  Toes are down-going.  Bilateral upper and lower extremity sensation is intact to light touch.    Gait is normal.   No difficulty with tandem gait.   No evidence of dysmetria noted.  Medical Decision Making  Imaging: ***  I have personally reviewed the images and agree with the above interpretation.  Assessment and Plan: Ms. Schisler is a pleasant 52 y.o. female with ***    Thank you for involving me in the care of this patient.   I spent a total of *** minutes in both face-to-face and non-face-to-face activities for this visit on the date of this encounter.   [facility]

## 2023-07-10 ENCOUNTER — Ambulatory Visit: Payer: MEDICAID | Admitting: Internal Medicine

## 2023-07-16 ENCOUNTER — Other Ambulatory Visit (HOSPITAL_COMMUNITY): Payer: Self-pay

## 2023-07-17 ENCOUNTER — Ambulatory Visit: Payer: MEDICAID | Admitting: Neurosurgery

## 2023-07-24 ENCOUNTER — Ambulatory Visit: Payer: MEDICAID | Admitting: Family Medicine

## 2023-07-25 ENCOUNTER — Encounter: Payer: Self-pay | Admitting: Family Medicine

## 2023-08-07 ENCOUNTER — Other Ambulatory Visit (HOSPITAL_COMMUNITY): Payer: Self-pay

## 2023-08-10 ENCOUNTER — Other Ambulatory Visit (HOSPITAL_COMMUNITY): Payer: Self-pay

## 2023-08-10 ENCOUNTER — Encounter (HOSPITAL_COMMUNITY): Payer: Self-pay

## 2023-08-15 ENCOUNTER — Other Ambulatory Visit (HOSPITAL_COMMUNITY): Payer: Self-pay

## 2023-08-17 ENCOUNTER — Other Ambulatory Visit (HOSPITAL_COMMUNITY): Payer: Self-pay

## 2023-08-17 ENCOUNTER — Other Ambulatory Visit: Payer: Self-pay

## 2023-09-05 ENCOUNTER — Other Ambulatory Visit: Payer: Self-pay

## 2023-09-10 ENCOUNTER — Other Ambulatory Visit: Payer: Self-pay

## 2023-09-12 ENCOUNTER — Other Ambulatory Visit (HOSPITAL_COMMUNITY): Payer: Self-pay

## 2023-09-12 ENCOUNTER — Other Ambulatory Visit: Payer: Self-pay

## 2023-09-12 NOTE — Progress Notes (Signed)
Specialty Pharmacy Refill Coordination Note  Rachel Underwood is a 52 y.o. female contacted today regarding refills of specialty medication(s) Bictegravir-Emtricitab-Tenofov  Contact was made by [facility]. Patient requested Delivery   Delivery date: 09/14/23   Verified address: [facility], [address] [place], [address]   Medication will be filled on 09/13/2023.

## 2023-09-13 ENCOUNTER — Other Ambulatory Visit: Payer: Self-pay

## 2023-10-04 ENCOUNTER — Other Ambulatory Visit: Payer: Self-pay

## 2023-10-08 ENCOUNTER — Other Ambulatory Visit: Payer: Self-pay

## 2023-10-10 ENCOUNTER — Other Ambulatory Visit: Payer: Self-pay

## 2023-10-11 ENCOUNTER — Other Ambulatory Visit: Payer: Self-pay

## 2023-10-16 ENCOUNTER — Other Ambulatory Visit: Payer: Self-pay

## 2023-10-16 NOTE — Progress Notes (Signed)
Specialty Pharmacy Refill Coordination Note  Rachel Underwood is a 52 y.o. 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 Patient requested Delivery   Delivery date: 10/18/23   Verified address: [facility], [address] [place], [address]   Medication will be filled on 10/17/23.

## 2023-10-17 ENCOUNTER — Other Ambulatory Visit: Payer: Self-pay

## 2023-11-07 ENCOUNTER — Other Ambulatory Visit: Payer: Self-pay

## 2023-11-09 ENCOUNTER — Other Ambulatory Visit: Payer: Self-pay

## 2023-12-10 ENCOUNTER — Other Ambulatory Visit: Payer: Self-pay

## 2023-12-11 ENCOUNTER — Other Ambulatory Visit (HOSPITAL_COMMUNITY): Payer: Self-pay

## 2023-12-12 ENCOUNTER — Other Ambulatory Visit (HOSPITAL_COMMUNITY): Payer: Self-pay

## 2023-12-17 ENCOUNTER — Other Ambulatory Visit: Payer: Self-pay | Admitting: Pharmacist

## 2023-12-17 NOTE — Progress Notes (Signed)
 Clinic and call center staff have not been able to contact for at least 2 months. Disenrolling in services at this time.   Alan Geralds, PharmD, CPP, BCIDP, AAHIVP Clinical Pharmacist Practitioner Infectious Diseases Clinical Pharmacist [facility]

## 2024-01-09 ENCOUNTER — Other Ambulatory Visit (HOSPITAL_COMMUNITY): Payer: Self-pay

## 2024-04-14 NOTE — Progress Notes (Signed)
 The ASCVD Risk score (Arnett DK, et al., 2019) failed to calculate for the following reasons:   The valid total cholesterol range is 130 to 320 mg/dL  Arlon Bergamo, BSN, RN

## 2024-07-02 ENCOUNTER — Ambulatory Visit: Payer: MEDICAID | Admitting: Internal Medicine

## 2024-07-03 ENCOUNTER — Ambulatory Visit: Payer: Self-pay | Admitting: Infectious Diseases

## 2024-07-09 ENCOUNTER — Ambulatory Visit: Payer: Self-pay | Admitting: Infectious Diseases

## 2024-07-12 ENCOUNTER — Other Ambulatory Visit (HOSPITAL_COMMUNITY): Payer: Self-pay

## 2024-07-22 ENCOUNTER — Telehealth: Payer: Self-pay | Admitting: Internal Medicine

## 2024-07-22 NOTE — Telephone Encounter (Signed)
 Called patient back about message. Patient stated that she has palpitations, SOB, and BLE edema that comes and goes. Patient stated this has been going on for over a year but symptoms have been worse recently. Patient has not been seen in a while due to being in jail. Patient is trying to get back in with her doctors. Informed patient that she needs to be seen sooner than later and put her on the DOD schedule. Encouraged patient to go to ED if her symptoms get worse. Encouraged patient to keep a low salt diet and elevate her legs when sitting. Patient stated she still is on propranolol  20 mg BID. Will send to Dr. Waddell for further advisement.

## 2024-07-22 NOTE — Telephone Encounter (Signed)
  Per mychart scheduling message:  Patient c/o Palpitations: STAT if patient c/o lightheadedness, shortness of breath, or chest pain  How long have you had palpitations/irregular HR/ Afib? Are you having the symptoms now?   Are you currently experiencing lightheadedness, SOB or CP?   Do you have a history of afib (atrial fibrillation) or irregular heart rhythm?   Have you checked your BP or HR? (document readings if available):   Are you experiencing any other symptoms?    1. Most of my life 2. Light headed  3. Yes 4. No 5. Swelling of hands, feet, ankles and legs

## 2024-07-24 ENCOUNTER — Other Ambulatory Visit: Payer: Self-pay

## 2024-07-24 ENCOUNTER — Ambulatory Visit: Admitting: Infectious Diseases

## 2024-07-24 ENCOUNTER — Encounter: Payer: Self-pay | Admitting: Infectious Diseases

## 2024-07-24 ENCOUNTER — Other Ambulatory Visit (HOSPITAL_COMMUNITY)
Admission: RE | Admit: 2024-07-24 | Discharge: 2024-07-24 | Disposition: A | Discharge status: Home / Self Care | Source: Ambulatory Visit | Attending: Infectious Diseases | Admitting: Infectious Diseases

## 2024-07-24 VITALS — BP 128/82 | HR 65 | Temp 98.0°F | Ht 65.0 in | Wt 230.0 lb

## 2024-07-24 DIAGNOSIS — R110 Nausea: Secondary | ICD-10-CM | POA: Diagnosis not present

## 2024-07-24 DIAGNOSIS — Z23 Encounter for immunization: Secondary | ICD-10-CM

## 2024-07-24 DIAGNOSIS — R350 Frequency of micturition: Secondary | ICD-10-CM

## 2024-07-24 DIAGNOSIS — Z113 Encounter for screening for infections with a predominantly sexual mode of transmission: Secondary | ICD-10-CM

## 2024-07-24 DIAGNOSIS — E785 Hyperlipidemia, unspecified: Secondary | ICD-10-CM | POA: Diagnosis not present

## 2024-07-24 DIAGNOSIS — Z8619 Personal history of other infectious and parasitic diseases: Secondary | ICD-10-CM

## 2024-07-24 DIAGNOSIS — K219 Gastro-esophageal reflux disease without esophagitis: Secondary | ICD-10-CM

## 2024-07-24 DIAGNOSIS — F319 Bipolar disorder, unspecified: Secondary | ICD-10-CM

## 2024-07-24 DIAGNOSIS — B20 Human immunodeficiency virus [HIV] disease: Secondary | ICD-10-CM | POA: Diagnosis present

## 2024-07-24 DIAGNOSIS — Z131 Encounter for screening for diabetes mellitus: Secondary | ICD-10-CM

## 2024-07-24 DIAGNOSIS — R102 Pelvic and perineal pain: Secondary | ICD-10-CM

## 2024-07-24 LAB — URINALYSIS, ROUTINE W REFLEX MICROSCOPIC
Bilirubin Urine: NEGATIVE
Glucose, UA: NEGATIVE
Hgb urine dipstick: NEGATIVE
Ketones, ur: NEGATIVE
Leukocytes,Ua: NEGATIVE
Nitrite: NEGATIVE
Protein, ur: NEGATIVE
Specific Gravity, Urine: 1.012 (ref 1.001–1.035)
pH: 5.5 (ref 5.0–8.0)

## 2024-07-24 MED ORDER — VALACYCLOVIR HCL 1 G PO TABS
1000.0000 mg | ORAL_TABLET | Freq: Every day | ORAL | 5 refills | Status: DC
Start: 2024-07-24 — End: 2024-09-29
  Filled 2024-08-27: qty 30, 30d supply, fill #0

## 2024-07-24 MED ORDER — BIKTARVY 50-200-25 MG PO TABS
1.0000 | ORAL_TABLET | Freq: Every day | ORAL | 5 refills | Status: DC
Start: 2024-07-24 — End: 2024-10-16

## 2024-07-24 MED ORDER — HYDROXYZINE HCL 50 MG PO TABS
50.0000 mg | ORAL_TABLET | Freq: Two times a day (BID) | ORAL | 2 refills | Status: DC | PRN
  Filled 2024-08-14: qty 60, 15d supply, fill #0
  Filled 2024-09-07: qty 60, 15d supply, fill #1

## 2024-07-24 MED ORDER — ROSUVASTATIN CALCIUM 10 MG PO TABS
10.0000 mg | ORAL_TABLET | Freq: Every day | ORAL | 2 refills | Status: DC
  Filled 2024-08-27: qty 30, 30d supply, fill #0
  Filled 2024-09-07 – 2024-10-11 (×2): qty 30, 30d supply, fill #1

## 2024-07-24 MED ORDER — ONDANSETRON 4 MG PO TBDP
4.0000 mg | ORAL_TABLET | Freq: Three times a day (TID) | ORAL | 3 refills | Status: AC | PRN
  Filled 2024-08-14: qty 20, 7d supply, fill #0
  Filled 2024-11-22: qty 20, 7d supply, fill #1
  Filled 2024-12-14: qty 20, 7d supply, fill #2

## 2024-07-24 NOTE — Patient Instructions (Addendum)
 Women's healthcare referral to: [facility] [address] [place],  [place]  [address] Main: [phone]  For primary care services, please call one of the following clinics for a new patient appointment:   1. Internal Medicine Clinic - ground floor of our building - [phone]  2. [facility] - [address].  [phone]  3. [facility] is located at [address], [address], [place], [place] [address]. [phone]  Smoking Cessation: QuitlineNC 1-800-QUIT-NOW [phone]); Espaol: 1-855-Djelo-Ya (1-458-724-6648) [email or web address]

## 2024-07-24 NOTE — Progress Notes (Signed)
 Name: Rachel Underwood  DOB: Jul 19, 1971 MRN: [id number] PCP: Zarwolo, Gloria, FNP   Subjective  Chief Complaint  Patient presents with   Follow-up     Discussed the use of AI scribe software for clinical note transcription with the patient, who gave verbal consent to proceed.  History of Present Illness   Rachel Underwood is a 53 year old female with HIV who presents for routine follow-up care coordination. She is currently on Biktarvy  for HIV treatment and requires a refill. Since her release from jail, she has been in the process of seeing different doctors as her medications were not managed appropriately in prison. She last saw Dr. Luiz Danilyn 2024 and regularly follows with her.   She requests a refill for Acyclovir , which she takes twice daily as a suppressive dose for herpes outbreaks. She experiences outbreaks once or twice a year, particularly when stressed.  She discontinued Seroquel  while incarcerated due to excessive sedation, which led to a head injury and subsequent nerve issues. Upon returning to her previous facility, Seroquel  was reintroduced instead of Vistaril , which she tolerated well in prison. She is currently taking half the prescribed dose of Seroquel  and experiences frequent nocturnal awakenings and urination, approximately seven to eight times per night. She has a history of hysterectomy and reports no menstrual cycles.  She is on Lamictal , taking 400 mg daily, although her prescribed dose is 200 mg; she reported taking extra because her moods were 'all over the place.' She is scheduled to see her psychiatrist soon. She also takes Zofran  for nausea, preferring the dissolvable form, and requires a refill.  She has a history of GERD, contributing to her sleep disturbances. She is on Crestor  for cholesterol management, although she was previously on pitavastatin  during a study, with the medication changed in prison due to cost.  She has a history of diabetes screening and  reported that some previous labs showed her sugar was 'too low.' She is concerned about ovarian health due to a family history of cancer and reports pelvic pain. She is interested in having her ovaries checked as they were left intact after her hysterectomy.  She has adopted a new pet, a blue gator mouth dog, which has been experiencing diarrhea since being fed new food. She has not yet taken the dog to the vet but has administered deworming medication at home.          06/20/2023    1:55 PM  Depression screen PHQ 2/9  Decreased Interest 1  Down, Depressed, Hopeless 1  PHQ - 2 Score 2  Altered sleeping 3  Tired, decreased energy 1  Change in appetite 1  Feeling bad or failure about yourself  0  Trouble concentrating 0  Moving slowly or fidgety/restless 0  Suicidal thoughts 0  PHQ-9 Score 7  Difficult doing work/chores Somewhat difficult    Review of Systems  Constitutional:  Negative for chills and fever.  Respiratory: Negative.    Gastrointestinal:  Positive for heartburn. Negative for abdominal pain, diarrhea and vomiting.  Genitourinary:  Positive for frequency.       Nocturia +  Skin: Negative.   Neurological: Negative.     Past Medical History:  Diagnosis Date   Abnormal Pap smear of cervix 2005(EST)   ckc for abnl pap- [facility] [place]   Allergy    Asthma    Bipolar 1 disorder (HCC)    Borderline personality disorder (HCC)    BPD (bronchopulmonary dysplasia)  Carpal tunnel syndrome    bilateral wrist   Depression    Dysmenorrhea 06/03/2014   Enlarged uterus 06/03/2014   Fibromyalgia    GERD (gastroesophageal reflux disease)    Herpes simplex without mention of complication    HIV (human immunodeficiency virus infection) (HCC)    Hyperlipidemia    Hypertension    Menorrhagia with irregular cycle 06/03/2014   Osteoarthritis    Seizures (HCC)    had as a teenager, unknown etiology and no meds.    Outpatient Medications Prior to Visit  Medication  Sig Dispense Refill   albuterol  (VENTOLIN  HFA) 108 (90 Base) MCG/ACT inhaler INHALE 2 PUFFS INTO THE LUNGS EVERY 4 HOURS AS NEEDED FOR WHEEZING OR SHORTNESS OF BREATH. 18 g 2   amLODipine  (NORVASC ) 10 MG tablet Take 10 mg by mouth daily.     cromolyn  (OPTICROM ) 4 % ophthalmic solution Place 1 drop into both eyes daily. 10 mL 3   cycloSPORINE  (RESTASIS ) 0.05 % ophthalmic emulsion Place 1 drop into both eyes 2 (two) times daily. 180 each 2   EPINEPHrine  (EPIPEN  2-PAK) 0.3 mg/0.3 mL IJ SOAJ injection Inject 1 pen (0.3 mg) into the skin as directed as needed for anaphylaxis. 2 each 1   ibuprofen  (ADVIL ) 800 MG tablet Take 1 tablet (800 mg total) by mouth every 8 (eight) hours as needed. 30 tablet 0   lamoTRIgine  (LAMICTAL ) 200 MG tablet Take 200 mg by mouth daily.     nystatin  (MYCOSTATIN /NYSTOP ) powder Apply topically 3 times daily for up to 2 weeks for rash under breast. 30 g 1   acyclovir  (ZOVIRAX ) 400 MG tablet Take 1 tablet (400 mg total) by mouth 2 (two) times daily as needed for prophylaxis 180 tablet 3   bictegravir-emtricitabine -tenofovir  AF (BIKTARVY ) 50-200-25 MG TABS tablet Take 1 tablet by mouth daily.     lamoTRIgine  (LAMICTAL ) 200 MG tablet Take 1 tablet (200 mg total) by mouth daily. 30 tablet 3   propranolol  (INDERAL ) 20 MG tablet Take 1 tablet (20 mg total) by mouth 2 (two) times daily. 60 tablet 6   rosuvastatin  (CRESTOR ) 10 MG tablet Take 1 tablet (10 mg total) by mouth daily. 30 tablet 11   sertraline  (ZOLOFT ) 100 MG tablet Take 100 mg by mouth daily.     cariprazine  (VRAYLAR ) 3 MG capsule Take 1 capsule (3 mg total) by mouth every evening. 30 capsule 0   Ensure (ENSURE) Take 237 mLs by mouth 2 (two) times daily between meals. (Patient not taking: Reported on 08/11/2024) 237 mL 6   eszopiclone  (LUNESTA ) 1 MG TABS tablet Take 1 tablet (1 mg total) by mouth at bedtime, for sleep. 30 tablet 1   linaclotide  (LINZESS ) 290 MCG CAPS capsule Take 1 capsule by mouth daily on empty stomach 30  capsule 2   methocarbamol  (ROBAXIN ) 500 MG tablet Take 1 tablet (500 mg total) by mouth 4 (four) times daily. (Patient not taking: Reported on 08/11/2024) 30 tablet 1   pantoprazole  (PROTONIX ) 40 MG tablet      terbinafine  (LAMISIL ) 250 MG tablet Take 1 tablet (250 mg total) by mouth daily. 30 tablet 4   azelastine  (ASTELIN ) 0.1 % nasal spray Place 1 spray into both nostrils 2 (two) times daily. (Patient not taking: Reported on 07/24/2024) 30 mL 12   cyclobenzaprine  (FLEXERIL ) 5 MG tablet Take 1 tablet (5 mg total) by mouth 3 (three) times daily as needed for muscle spasms. (Patient not taking: Reported on 07/24/2024) 30 tablet 1   lamoTRIgine  (LAMICTAL ) 100  MG tablet Take 1 tablet (100 mg total) by mouth every evening. (Patient not taking: Reported on 07/24/2024) 30 tablet 1   lamoTRIgine  (LAMICTAL ) 100 MG tablet Take 1 tablet (100 mg total) by mouth every evening. (Patient not taking: Reported on 07/24/2024) 30 tablet 1   lamoTRIgine  (LAMICTAL ) 200 MG tablet Take 1 tablet (200 mg total) by mouth every morning. (Patient not taking: Reported on 07/24/2024) 30 tablet 1   lamoTRIgine  (LAMICTAL ) 200 MG tablet Take 1 tablet (200 mg total) by mouth every morning. (Patient not taking: Reported on 07/24/2024) 30 tablet 1   metroNIDAZOLE  (FLAGYL ) 500 MG tablet Take 1 tablet (500 mg total) by mouth 2 (two) times daily for 7 days (Patient not taking: Reported on 07/24/2024) 14 tablet 0   montelukast  (SINGULAIR ) 10 MG tablet Take 1 tablet (10 mg) by mouth at bedtime. (Patient not taking: Reported on 07/24/2024) 30 tablet 3   ondansetron  (ZOFRAN -ODT) 4 MG disintegrating tablet Dissolve 1 tablet (4 mg total) by mouth 3 (three) times daily as needed. (Patient not taking: Reported on 07/24/2024) 20 tablet 3   topiramate  (TOPAMAX ) 25 MG tablet Take 1 tablet (25 mg total) by mouth 2 (two) times daily for 7 days, THEN 2 tablets (50 mg total) 2 (two) times daily. (Patient not taking: No sig reported) 60 tablet 0   No  facility-administered medications prior to visit.     Allergies  Allergen Reactions   Flecainide  Swelling   Morphine Shortness Of Breath    Reports it caused her to pass out and have difficulty breathing   Piroxicam Anaphylaxis   Abilify [Aripiprazole]    Citalopram Hives   Codeine Nausea And Vomiting   Depakote [Divalproex Sodium] Swelling   Gabapentin     Hives    Haldol [Haloperidol Lactate] Other (See Comments)    Slurs speech and stumble   Haloperidol    Indomethacin Swelling    Dizzy, tigling of face   Invega [Paliperidone] Other (See Comments)    Slurred speech, drooling   Latuda  [Lurasidone  Hcl] Itching   Lithium Hives   Lurasidone     Promethazine  Swelling    Arms and hands   Trazodone And Nefazodone Other (See Comments)    Dry mouth, congestion, severe dryness.   Lexapro [Escitalopram Oxalate] Rash    Social History   Tobacco Use   Smoking status: Every Day    Types: E-cigarettes, Cigarettes   Smokeless tobacco: Never   Tobacco comments:    1 PPD  Vaping Use   Vaping status: Never Used  Substance Use Topics   Alcohol use: Not Currently    Comment: Very arely   Drug use: No    Types: Crack cocaine    Comment: last cocaine use 11/2022    Social History   Substance and Sexual Activity  Sexual Activity Yes   Partners: Male   Birth control/protection: Condom        Objective  Vitals:   07/24/24 1401  BP: 128/82  Pulse: 65  Temp: 98 F (36.7 C)  TempSrc: Temporal  SpO2: 97%  Weight: 230 lb (104.3 kg)  Height: 5' 5 (1.651 m)   Body mass index is 38.27 kg/m.  Physical Exam Constitutional:      Appearance: She is well-developed.     Comments: Seated comfortably in chair.   HENT:     Mouth/Throat:     Mouth: No oral lesions.     Dentition: Normal dentition. No dental abscesses.     Pharynx: No  oropharyngeal exudate.  Cardiovascular:     Rate and Rhythm: Normal rate and regular rhythm.     Heart sounds: Normal heart sounds.   Pulmonary:     Effort: Pulmonary effort is normal.     Breath sounds: Normal breath sounds.  Abdominal:     General: There is no distension.     Palpations: Abdomen is soft.     Tenderness: There is no abdominal tenderness.  Lymphadenopathy:     Cervical: No cervical adenopathy.  Skin:    General: Skin is warm and dry.     Findings: No rash.  Neurological:     Mental Status: She is alert and oriented to person, place, and time.  Psychiatric:        Judgment: Judgment normal.     Comments: In good spirits today and engaged in care discussion       Assessment and Plan    HIV infection - HIV infection is well-managed with an undetectable viral load as of March 2025 and a CD4 count of 940. - Send Biktarvy  refills to [facility] on [address].  Genital herpes on suppressive therapy - Genital herpes managed with suppressive therapy. Considering switching to valacyclovir  (Valtrex ) for once daily dosing to improve adherence and convenience. Infrequent outbreaks, approximately once or twice a year, with increased stress levels potentially contributing to outbreaks. Prefers to continue suppressive therapy. - Switch from acyclovir  to valacyclovir  (Valtrex ) once daily.  Mood disorder - Mood disorder management includes lamotrigine , with self-adjustment to 400 mg due to mood instability. Concerns about medication interactions and side effects, including dizziness and blurred vision, particularly with hydroxyzine  and lamotrigine . - Continue lamotrigine  as prescribed. - Follow up with psychiatrist on August 26, 2024. - Refill hydroxyzine  with instructions to take up to 100 mg if needed.  Gastroesophageal reflux disease (GERD) - GERD symptoms persist, contributing to sleep disturbances. - Continue current GERD management.  Hyperlipidemia - Hyperlipidemia managed with rosuvastatin  (Crestor ) due to insurance coverage. Crestor  is an acceptable alternative to pitavastatin . - Continue  rosuvastatin  (Crestor ) 10 mg.  Nocturia and urinary frequency - Reports of nocturia and urinary frequency, urinating seven to ten times a night. Differential includes possible urological issues. No signs of urinary tract infection. ?pelvic floor syndrome d/t estrogen deficiency vs bladder spasms  - Consider referral to urology for further evaluation of nocturia and urinary frequency.  Chronic pelvic pain - Chronic pelvic pain with concerns about ovarian health due to family history of cancer. Previous hysterectomy with ovaries intact. Interest in gynecological evaluation for ovarian health. - [facility]er to [facility] for gynecological evaluation.  Nausea, recurrent - Recurrent nausea managed with ondansetron  (Zofran ). Preference for dissolvable tablets due to ease of use. - Send ondansetron  (Zofran ) refills to [facility].  Follow-up care coordination - Follow-up care coordination is necessary due to multiple health issues and recent release from incarceration. Need for primary care provider and coordination of specialty care. - Provide contact information for internal medicine clinic and [facility] wellness center for primary care follow-up. - Plan to return for follow-up with Dr. Luiz in 3-4 months.      Orders Placed This Encounter  Procedures   MENINGOCOCCAL MCV4O   Urinalysis, Routine w reflex microscopic   Hemoglobin A1c   HIV RNA, RTPCR W/R GT (RTI, PI,INT)   Ambulatory referral to Obstetrics / Gynecology    Referral Priority:   Routine    Referral Type:   Consultation    Referral Reason:  Specialty Services Required    Requested Specialty:   Obstetrics and Gynecology    Number of Visits Requested:   1    Meds ordered this encounter  Medications   valACYclovir  (VALTREX ) 1000 MG tablet    Sig: Take 1 tablet (1,000 mg total) by mouth daily.    Dispense:  30 tablet    Refill:  5   bictegravir-emtricitabine -tenofovir  AF (BIKTARVY ) 50-200-25 MG  TABS tablet    Sig: Take 1 tablet by mouth daily.    Dispense:  30 tablet    Refill:  5    Prescription Type::   Renewal   ondansetron  (ZOFRAN -ODT) 4 MG disintegrating tablet    Sig: Dissolve 1 tablet (4 mg total) by mouth 3 (three) times daily as needed.    Dispense:  20 tablet    Refill:  3   hydrOXYzine  (ATARAX ) 50 MG tablet    Sig: Take 1-2 tablets (50-100 mg total) by mouth every 12 (twelve) hours as needed.    Dispense:  60 tablet    Refill:  2   rosuvastatin  (CRESTOR ) 10 MG tablet    Sig: Take 1 tablet (10 mg total) by mouth daily.    Dispense:  30 tablet    Refill:  2    Return in about 3 months (around 10/24/2024). With Dr. Luiz   Visit duration: 33 minutes face to face time. Total encounter time 50 minutes including review of records she brought with her, chart records pertinent to ID and coordination through the visit including personal discission with case manager, Jori Corean Fireman, MSN, NP-C [facility]  [email or web address] Pager: [phone] Office: [phone] [facility] Main Line: [phone] *Secure Chat Communication Welcome

## 2024-07-25 ENCOUNTER — Other Ambulatory Visit (HOSPITAL_COMMUNITY): Payer: Self-pay

## 2024-07-25 LAB — URINE CYTOLOGY ANCILLARY ONLY
Chlamydia: NEGATIVE
Comment: NEGATIVE
Comment: NORMAL
Neisseria Gonorrhea: NEGATIVE

## 2024-07-26 ENCOUNTER — Other Ambulatory Visit: Payer: Self-pay | Admitting: Medical Genetics

## 2024-07-28 ENCOUNTER — Ambulatory Visit: Admitting: Cardiology

## 2024-07-28 LAB — HIV RNA, RTPCR W/R GT (RTI, PI,INT)
HIV 1 RNA Quant: NOT DETECTED {copies}/mL
HIV-1 RNA Quant, Log: NOT DETECTED {Log_copies}/mL

## 2024-07-28 LAB — HEMOGLOBIN A1C
Hgb A1c MFr Bld: 5.2 % (ref <5.7)
Mean Plasma Glucose: 103 mg/dL
eAG (mmol/L): 5.7 mmol/L

## 2024-07-29 ENCOUNTER — Ambulatory Visit: Payer: Self-pay | Admitting: Infectious Diseases

## 2024-07-30 ENCOUNTER — Other Ambulatory Visit (INDEPENDENT_AMBULATORY_CARE_PROVIDER_SITE_OTHER)

## 2024-07-30 ENCOUNTER — Ambulatory Visit (INDEPENDENT_AMBULATORY_CARE_PROVIDER_SITE_OTHER): Admitting: Orthopedic Surgery

## 2024-07-30 VITALS — BP 128/88 | HR 60 | Ht 65.5 in | Wt 232.0 lb

## 2024-07-30 DIAGNOSIS — M542 Cervicalgia: Secondary | ICD-10-CM | POA: Diagnosis not present

## 2024-07-30 DIAGNOSIS — M25511 Pain in right shoulder: Secondary | ICD-10-CM | POA: Diagnosis not present

## 2024-07-30 DIAGNOSIS — G8929 Other chronic pain: Secondary | ICD-10-CM

## 2024-07-30 MED ORDER — NAPROXEN 500 MG PO TABS
500.0000 mg | ORAL_TABLET | Freq: Two times a day (BID) | ORAL | 2 refills | Status: AC
Start: 2024-07-30 — End: ?
  Filled 2024-09-07: qty 60, 30d supply, fill #0

## 2024-07-30 NOTE — Patient Instructions (Signed)
 Physical therapy has been ordered for you at [facility]. They should call you to schedule, [phone] is the phone number to call, if you want to call to schedule.

## 2024-07-30 NOTE — Progress Notes (Signed)
   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  She says she was not taken to the doctor or seen by a physician at that time but was in the process of obtaining physical therapy for her back in some handouts were given for her to do exercises on her own in the jail cell  She has since been discharged she is on Medicaid and she is here for evaluation of her right shoulder with some mild left shoulder symptoms.  Right shoulder she has noticed a difference and that she cannot lift her arm above her head she is having painful range of motion and weakness  Examination reveals a healthy well-developed well-nourished female she is awake alert and oriented x 3 mood and affect are normal  The right shoulder has normal external rotation when her arm is at her side she has tenderness to palpation along the anterior joint line and lateral deltoid.  Shoulder is stable in abduction external rotation she has weakness in abduction and flexion her passive range of motion in flexion is normal the skin is intact the portal sites from the arthroscopy are normal she has good pulse in the right arm no lymphadenopathy in the axilla or supraclavicular region and normal sensation in the right upper extremity  Imaging studies of her neck show mild degenerative changes  Right shoulder shows no arthritis or proximal migration of the humerus there is sclerosis along the greater tuberosity  The best thing to do would be to do an MRI but she is on Medicaid  She did not improve with ibuprofen  and home exercises recommend formal physical therapy switch to naproxen  and follow-up in 6 weeks if no improvement MRI  should be approved at that point   She did not want an injection she says she had some in her neck back and shoulder when she had the previous cuff injuries and they did work   Meds ordered this encounter  Medications   naproxen  (NAPROSYN ) 500 MG tablet    Sig: Take 1 tablet (500 mg total) by mouth 2 (two) times daily with a meal.    Dispense:  60 tablet    Refill:  2

## 2024-07-30 NOTE — Progress Notes (Addendum)
  Intake history:  LMP 08/11/2014  There is no height or weight on file to calculate BMI.    WHAT ARE WE SEEING YOU FOR TODAY?   bilateral shoulder  How Rachel Underwood has this bothered you? (DOI?DOS?WS?)  4 month(s) ago  Was there an injury? Yes- fell on a greassy floor while in prison  Anticoag.  No  Diabetes No  Heart disease Yes-AFIB PALPATATIONS  Hypertension Yes  SMOKING HX Yes  Kidney disease No  Any ALLERGIES _____Yes- SEE EPIC_________________________________________   Treatment:  Have you taken:  Tylenol  Yes  Advil  Yes  Had PT Yes  Had injection No  Other  _________Had surgery RCR on bilateral shoulders over 10 years ago________________

## 2024-07-31 ENCOUNTER — Telehealth: Payer: Self-pay

## 2024-07-31 ENCOUNTER — Telehealth: Payer: Self-pay | Admitting: Family Medicine

## 2024-07-31 NOTE — Telephone Encounter (Signed)
 Copied from CRM [id number]. Topic: Referral - Request for Referral >> Jul 31, 2024 11:14 AM Mia F wrote: Did the patient discuss referral with their provider in the last year? Yes (If No - schedule appointment) (If Yes - send message)  Appointment offered? No  Type of order/referral and detailed reason for visit: Severe GERD and IBS  Preference of office, provider, location: Gastro on [address] [place])   If referral order, have you been seen by this specialty before? Yes- Same issue. Referral has ran out  (If Yes, this issue or another issue? When? Where?  Can we respond through MyChart? Yes

## 2024-07-31 NOTE — Telephone Encounter (Signed)
 Called patient  Flu shot and 1st dose of shingle shot wasgiven 08.27.20[address] [facility] [address] [place]   Copied from CRM [id number]. Topic: General - Other >> Jul 31, 2024 11:10 AM Jasmin G wrote: Reason for CRM: Pt wanted to update her records on 2 shots that she recently got at [facility] in [address], a flu shot and first dose of shingles. Pt also requested a referral to get a hearing test done. Call her back if needed at [phone].

## 2024-07-31 NOTE — Telephone Encounter (Signed)
Added to immunizations

## 2024-08-01 ENCOUNTER — Other Ambulatory Visit: Payer: Self-pay

## 2024-08-01 ENCOUNTER — Other Ambulatory Visit (HOSPITAL_COMMUNITY)
Admission: RE | Admit: 2024-08-01 | Discharge: 2024-08-01 | Disposition: A | Discharge status: Home / Self Care | Payer: Self-pay | Source: Ambulatory Visit | Attending: Medical Genetics | Admitting: Medical Genetics

## 2024-08-01 DIAGNOSIS — K589 Irritable bowel syndrome without diarrhea: Secondary | ICD-10-CM

## 2024-08-01 DIAGNOSIS — K2100 Gastro-esophageal reflux disease with esophagitis, without bleeding: Secondary | ICD-10-CM

## 2024-08-01 NOTE — Telephone Encounter (Signed)
Sent, pt notified

## 2024-08-01 NOTE — Telephone Encounter (Signed)
 [facility] referral

## 2024-08-07 ENCOUNTER — Encounter (HOSPITAL_COMMUNITY): Payer: Self-pay | Admitting: Occupational Therapy

## 2024-08-07 ENCOUNTER — Ambulatory Visit (HOSPITAL_COMMUNITY): Attending: Orthopedic Surgery | Admitting: Occupational Therapy

## 2024-08-07 ENCOUNTER — Ambulatory Visit: Admitting: Orthopedic Surgery

## 2024-08-07 DIAGNOSIS — M542 Cervicalgia: Secondary | ICD-10-CM | POA: Insufficient documentation

## 2024-08-07 DIAGNOSIS — G8929 Other chronic pain: Secondary | ICD-10-CM | POA: Insufficient documentation

## 2024-08-07 DIAGNOSIS — M25511 Pain in right shoulder: Secondary | ICD-10-CM | POA: Diagnosis present

## 2024-08-07 DIAGNOSIS — M25611 Stiffness of right shoulder, not elsewhere classified: Secondary | ICD-10-CM | POA: Diagnosis present

## 2024-08-07 DIAGNOSIS — R29898 Other symptoms and signs involving the musculoskeletal system: Secondary | ICD-10-CM | POA: Diagnosis present

## 2024-08-07 NOTE — Therapy (Addendum)
 OUTPATIENT OCCUPATIONAL THERAPY ORTHO EVALUATION  Patient Name: Rachel Underwood MRN: [id number] DOB:1971/05/09, 53 y.o., female Today's Date: 08/07/2024   END OF SESSION:  OT End of Session - 08/07/24 1233     Visit Number 1    Number of Visits 8    Date for OT Re-Evaluation 09/12/24    Authorization Type [place] Medicaid    OT Start Time 0806    OT Stop Time 0854    OT Time Calculation (min) 48 min    Activity Tolerance Patient tolerated treatment well    Behavior During Therapy WFL for tasks assessed/performed          Past Medical History:  Diagnosis Date   Abnormal Pap smear of cervix 2005(EST)   ckc for abnl pap- [facility] [place]   Allergy    Asthma    Bipolar 1 disorder (HCC)    Borderline personality disorder (HCC)    BPD (bronchopulmonary dysplasia)    Carpal tunnel syndrome    bilateral wrist   Depression    Dysmenorrhea 06/03/2014   Enlarged uterus 06/03/2014   Fibromyalgia    GERD (gastroesophageal reflux disease)    Herpes simplex without mention of complication    HIV (human immunodeficiency virus infection) (HCC)    Hyperlipidemia    Hypertension    Menorrhagia with irregular cycle 06/03/2014   Osteoarthritis    Seizures (HCC)    had as a teenager, unknown etiology and no meds.   Past Surgical History:  Procedure Laterality Date   ABDOMINAL HYSTERECTOMY N/A 09/01/2014   Procedure: HYSTERECTOMY ABDOMINAL;  Surgeon: Norleen Edsel GAILS, MD;  Location: [facility];  Service: Gynecology;  Laterality: N/A;   ANKLE SURGERY Right    repair of ligaments   BACK SURGERY     BILATERAL SALPINGECTOMY Bilateral 09/01/2014   Procedure: BILATERAL SALPINGECTOMY;  Surgeon: Norleen Edsel GAILS, MD;  Location: [facility];  Service: Gynecology;  Laterality: Bilateral;   CESAREAN SECTION     ckc     COLONOSCOPY N/A 12/23/2014   MFM:jwjo canal hemorrhoids left-sided diverticula   ESOPHAGOGASTRODUODENOSCOPY N/A 12/23/2014   MFM:zmndpcz reflux/noncritical shatzkis ring s/p dilation/small  HH   EXCISION OF SKIN TAG N/A 09/01/2014   Procedure: EXCISION OF SKIN NEVUS;  Surgeon: Norleen Edsel GAILS, MD;  Location: [facility];  Service: Gynecology;  Laterality: N/A;   FRACTURE SURGERY     MALONEY DILATION N/A 12/23/2014   Procedure: AGAPITO DILATION;  Surgeon: Lamar CHRISTELLA Hollingshead, MD;  Location: [facility];  Service: Endoscopy;  Laterality: N/A;   SAVORY DILATION N/A 12/23/2014   Procedure: SAVORY DILATION;  Surgeon: Lamar CHRISTELLA Hollingshead, MD;  Location: [facility];  Service: Endoscopy;  Laterality: N/A;   SCAR REVISION N/A 09/01/2014   Procedure: EXCISION OF CICATRIX;  Surgeon: Norleen Edsel GAILS, MD;  Location: [facility];  Service: Gynecology;  Laterality: N/A;   SHOULDER SURGERY Right    rotator cuff   SPINE SURGERY     Patient Active Problem List   Diagnosis Date Noted   Injury of head 03/15/2023   Respiratory infection 10/17/2022   Palpitations 10/03/2022   URI (upper respiratory infection) 08/18/2022   PSVT (paroxysmal supraventricular tachycardia) (HCC) 05/18/2022   Morbid obesity (HCC) 05/18/2022   Cocaine abuse (HCC) 05/18/2022   High risk sexual behavior 05/18/2022   Asthma 05/18/2022   Plantar wart 02/01/2021   Tobacco abuse disorder 02/01/2021   Positive ANA (antinuclear antibody) 01/28/2021   Arthritis 01/28/2021   Rash 01/28/2021   Fibromyalgia  syndrome 01/28/2021   Bipolar disorder in partial remission (HCC) 11/14/2016   Bilateral carpal tunnel syndrome 02/17/2016   Allergic rhinitis due to pollen 02/17/2016   Headache 11/09/2015   Back pain with history of spinal surgery 11/09/2015   Diarrhea 05/19/2015   Cysts of both ovaries 05/12/2015   Abdominal wall hernia 02/03/2015   Abdominal wall mass of periumbilical region 02/03/2015   Schatzki's ring    Diverticulosis of colon without hemorrhage    Other hemorrhoids    Rectal bleeding 12/11/2014   GERD (gastroesophageal reflux disease) 12/11/2014   Esophageal dysphagia 12/11/2014   Constipation 12/11/2014   S/P  hysterectomy 09/01/2014   Pelvic pain in female 07/22/2014   Anovulation 06/18/2014   DUB (dysfunctional uterine bleeding) 06/18/2014   Menorrhagia with irregular cycle 06/03/2014   Enlarged uterus 06/03/2014   HTN (hypertension) 01/15/2014   Human immunodeficiency virus (HIV) disease (HCC) 07/17/2013   Schizophrenia (HCC) 07/17/2013   Bipolar disorder, unspecified (HCC) 07/17/2013    PCP: Zarwolo, Gloria, FNP REFERRING PROVIDER: Margrette Bars, MD  ONSET DATE: 05/2024  REFERRING DIAG: R Shoulder Pain  THERAPY DIAG:  Right shoulder pain, unspecified chronicity  Shoulder stiffness, right  Other symptoms and signs involving the musculoskeletal system  Rationale for Evaluation and Treatment: Rehabilitation  SUBJECTIVE:   SUBJECTIVE STATEMENT: I've been struggling for a while. Pt accompanied by: self  PERTINENT HISTORY: 53 year old female status post bilateral rotator cuff surgery arthroscopically for rotator cuff tears over 10 years ago secondary to an injury.  PRECAUTIONS: None  WEIGHT BEARING RESTRICTIONS: No  PAIN:  Are you having pain? Yes: NPRS scale: 8/10 Pain location: trapezius Pain description: throbbing and stabbing Aggravating factors: anything Relieving factors: nothing  FALLS: Has patient fallen in last 6 months? Yes. Number of falls 4  PLOF: Independent  PATIENT GOALS: To decrease pain  NEXT MD VISIT: 09/11/24  OBJECTIVE:   HAND DOMINANCE: Right  ADLs: Overall ADLs: Pt having difficulty with dressing and unable to wash hair without severe pain. She is limited with grooming due to pain and stiffness. Pt unable to cook or clean at this time due to limited ability for lifting due to weakness and pain.  FUNCTIONAL OUTCOME MEASURES: Upper Extremity Functional Scale (UEFS): 32/80   Extreme difficulty/unable (0), Quite a bit of difficulty (1), Moderate difficulty (2), Little difficulty (3), No difficulty (4) Survey date:  08/07/24  Any of your  usual work, household or school activities 1  2. Your usual hobbies, recreational/sport activities 2   3. Lifting a bag of groceries to waist level 2   4. Lifting a bag of groceries above your head 1  5. Grooming your hair 1  6. Pushing up on your hands (I.e. from bathtub or chair) 1  7. Preparing food (I.e. peeling/cutting) 2  8. Driving  2  9. Vacuuming, sweeping, or raking 2  10. Dressing  1  11. Doing up buttons 3  12. Using tools/appliances 2  13. Opening doors 2  14. Cleaning  2  15. Tying or lacing shoes 3  16. Sleeping  0  17. Laundering clothes (I.e. washing, ironing, folding) 2  18. Opening a jar 1  19. Throwing a ball 1  20. Carrying a small suitcase with your affected limb.  1  Score total:  32/80     UPPER EXTREMITY ROM:       Assessed in seated, er/IR adducted  Active ROM Right eval  Shoulder flexion 146  Shoulder abduction 124  Shoulder internal rotation  90  Shoulder external rotation 20  (Blank rows = not tested)    UPPER EXTREMITY MMT:     Assessed in seated, er/IR adducted  MMT Right eval  Shoulder flexion 4/5  Shoulder abduction 4-/5  Shoulder internal rotation 4-/5  Shoulder external rotation 3+/5  (Blank rows = not tested)  SENSATION: Tingling noted from elbow down to hands  EDEMA: No swelling noted  OBSERVATIONS: Moderate fascial restrictions noted along the bicep, trapezius, and cervical region   TODAY'S TREATMENT:                                                                                                                              DATE:   08/07/24 AA/ROM: seated, flexion, abduction, protraction, horizontal abduction, er/IR, x10 Cervical ROM:  flexion, extension, lateral rotations, head tilts, x10    PATIENT EDUCATION: Education details: AA/ROM, Cervical ROM Person educated: Patient Education method: Explanation, Demonstration, and Handouts Education comprehension: verbalized understanding and returned  demonstration  HOME EXERCISE PROGRAM: 9/4: AA/ROM, Cervical ROM  GOALS: Goals reviewed with patient? Yes   SHORT TERM GOALS: Target date: 09/12/24  Pt will be provided with and educated on HEP to improve mobility in RUE required for use during ADL completion.   Goal status: INITIAL  LONG TERM GOALS: Target date: 09/12/24  Pt will decrease pain in RUE to 3/10 or less to improve ability to sleep for 2+ consecutive hours without waking due to pain.   Goal status: INITIAL  2.  Pt will decrease RUE fascial restrictions to min amounts or less to improve mobility required for functional reaching tasks.   Goal status: INITIAL  3.  Pt will increase RUE A/ROM by 20 degrees to improve ability to use RUE when reaching overhead or behind back during dressing and bathing tasks.   Goal status: INITIAL  4.  Pt will increase RUE strength to 5/5 or greater to improve ability to use RUE when lifting or carrying items during meal preparation/housework/yardwork tasks.   Goal status: INITIAL  5.  Pt will return to highest level of function using RUE as dominant during functional task completion.   Goal status: INITIAL   ASSESSMENT:  CLINICAL IMPRESSION: Patient is a 53 y.o. female who was seen today for occupational therapy evaluation for R shoulder pain. Pt presents with increased pain and fascial restrictions, decreased ROM, strength, and functional use of the RUE.   PERFORMANCE DEFICITS: in functional skills including in functional skills including ADLs, IADLs, coordination, tone, ROM, strength, pain, fascial restrictions, muscle spasms, and UE functional use.  IMPAIRMENTS: are limiting patient from ADLs, IADLs, rest and sleep, work, leisure, and social participation.   COMORBIDITIES: may have co-morbidities  that affects occupational performance. Patient will benefit from skilled OT to address above impairments and improve overall function.  MODIFICATION OR ASSISTANCE TO COMPLETE  EVALUATION: No modification of tasks or assist necessary to complete an evaluation.  OT OCCUPATIONAL PROFILE AND HISTORY: Problem focused assessment:  Including review of records relating to presenting problem.  CLINICAL DECISION MAKING: LOW - limited treatment options, no task modification necessary  REHAB POTENTIAL: Good  EVALUATION COMPLEXITY: Low      PLAN:  OT FREQUENCY: 2x/week  OT DURATION: 4 weeks  PLANNED INTERVENTIONS: 97168 OT Re-evaluation, 97535 self care/ADL training, 02889 therapeutic exercise, 97530 therapeutic activity, 97112 neuromuscular re-education, 97140 manual therapy, 97035 ultrasound, 97010 moist heat, 97032 electrical stimulation (manual), passive range of motion, functional mobility training, energy conservation, coping strategies training, patient/family education, and DME and/or AE instructions  RECOMMENDED OTHER SERVICES: N/A  CONSULTED AND AGREED WITH PLAN OF CARE: Patient  PLAN FOR NEXT SESSION: Manual Therapy, P/ROM, AA/ROM, Proximal Shoulder Exercises  Rachel Underwood, OTR/L [facility] [phone] Rachel Underwood, OT 08/07/2024, 12:36 PM   Managed Medicaid Authorization Request Treatment Start Date: 2024-09-01  Visit Dx Codes: M25.511, M25.611, R29.898  Functional Tool Score: UEFS: 32/80  For all possible CPT codes, reference the Planned Interventions line above.     Check all conditions that are expected to impact treatment: {Conditions expected to impact treatment:None of these apply   If treatment provided at initial evaluation, no treatment charged due to lack of authorization.

## 2024-08-07 NOTE — Patient Instructions (Signed)
 Perform each exercise ____10-15____ reps. 2-3x days.   1) Protraction   Start by holding a wand or cane at chest height.  Next, slowly push the wand outwards in front of your body so that your elbows become fully straightened. Then, return to the original position.     2) Shoulder FLEXION   In the standing position, hold a wand/cane with both arms, palms down on both sides. Raise up the wand/cane allowing your unaffected arm to perform most of the effort. Your affected arm should be partially relaxed.      3) Internal/External ROTATION   In the standing position, hold a wand/cane with both hands keeping your elbows bent. Move your arms and wand/cane to one side.  Your affected arm should be partially relaxed while your unaffected arm performs most of the effort.       4) Shoulder ABDUCTION   While holding a wand/cane palm face up on the injured side and palm face down on the uninjured side, slowly raise up your injured arm to the side.        5) Horizontal Abduction/Adduction      Straight arms holding cane at shoulder height, bring cane to right, center, left. Repeat starting to left.   Copyright  VHI. All rights reserved.     1) AROM: Lateral Neck Flexion   Slowly tilt head toward one shoulder, then the other. Hold each position ____ seconds. Repeat ____ times per set. Do ____ sets per session. Do ____ sessions per day.  http://orth.exer.us /296   Copyright  VHI. All rights reserved.  2) AROM: Neck Extension   Bend head backward. Hold ____ seconds. Repeat ____ times per set. Do ____ sets per session. Do ____ sessions per day.  http://orth.exer.us /300   Copyright  VHI. All rights reserved.  3) AROM: Neck Flexion   Bend head forward. Hold ____ seconds. Repeat ____ times per set. Do ____ sets per session. Do ____ sessions per day.  http://orth.exer.us /298   Copyright  VHI. All rights reserved.  4) AROM: Neck Rotation   Turn head slowly to  look over one shoulder, then the other. Hold each position ____ seconds. Repeat ____ times per set. Do ____ sets per session. Do ____ sessions per day.  http://orth.exer.us /294   Copyright  VHI. All rights reserved.

## 2024-08-08 NOTE — Progress Notes (Signed)
  Electrophysiology Office Note:   Date:  08/11/2024  ID:  Berdina Elainah, Rhyne Jan 03, 1971, MRN [id number]  Primary Cardiologist: Pearla Rout, MD (Inactive) Electrophysiologist: Danelle Birmingham, MD   Electrophysiologist:  Danelle Birmingham, MD      History of Present Illness:   Rachel Underwood is a 53 y.o. female with h/o palpitations and SVT seen today for routine electrophysiology followup.   Last seen 10/03/2022. Has been out of contact and trying to get back in with her providers.   Since last being seen in our clinic the patient reports doing OK. She reports mild SOB but reports being able to walk for 20-30 minutes without issues. Has intermittent peripheral edema, mostly sock lines, but states last week her legs were puffy. Rare, atypical chest pain that feels more like an ache, and can come on despite rest or activity. Always short lived when it does occur.  Has been off propranolol  and noticed significantly more palpitations.  Review of systems complete and found to be negative unless listed in HPI.   EP Information / Studies Reviewed:    EKG is ordered today. Personal review as below.  EKG Interpretation Date/Time:  Monday August 11 2024 09:20:01 EDT Ventricular Rate:  62 PR Interval:  162 QRS Duration:  86 QT Interval:  416 QTC Calculation: 422 R Axis:   52  Text Interpretation: Normal sinus rhythm Normal ECG When compared with ECG of 23-Oct-2022 12:37, PREVIOUS ECG IS PRESENT Confirmed by Lesia Sharper [phone]) on 08/11/2024 9:28:50 AM    Arrhythmia/Device History No specialty comments available.   Physical Exam:   VS:  BP (!) 152/88   Pulse 62   Ht 5' 5.5 (1.664 m)   Wt 242 lb 3.2 oz (109.9 kg)   LMP 08/11/2014   SpO2 99%   BMI 39.69 kg/m    Wt Readings from Last 3 Encounters:  08/11/24 242 lb 3.2 oz (109.9 kg)  07/30/24 232 lb (105.2 kg)  07/24/24 230 lb (104.3 kg)     GEN: No acute distress NECK: No JVD; No carotid bruits CARDIAC: Regular rate and rhythm  with occasional ectopy, no murmurs, rubs, gallops RESPIRATORY:  Clear to auscultation without rales, wheezing or rhonchi  ABDOMEN: Soft, non-tender, non-distended EXTREMITIES:  Trace edema; No deformity   ASSESSMENT AND PLAN:    SVT PACs PVCs EKG today shows NSR Resume propranolol  20 mg BID  HTN Stable on current regimen   Edema SOB Will update echo She has only trace ankle edema today, and states she can walk for 25-30 minutes before needing to take a break.  She is also drinking multiple 40z cups of water  a day. Encouraged fluid restriction to closer to 2 liters.    Follow up with EP Team in 4 months. Sooner with symptoms or abnormal echo.   Signed, Sharper Prentice Lesia, PA-C

## 2024-08-10 LAB — GENECONNECT MOLECULAR SCREEN: Genetic Analysis Overall Interpretation: NEGATIVE

## 2024-08-11 ENCOUNTER — Encounter: Payer: Self-pay | Admitting: Student

## 2024-08-11 ENCOUNTER — Other Ambulatory Visit: Payer: Self-pay

## 2024-08-11 ENCOUNTER — Other Ambulatory Visit (HOSPITAL_COMMUNITY): Payer: Self-pay

## 2024-08-11 ENCOUNTER — Ambulatory Visit: Attending: Student | Admitting: Student

## 2024-08-11 VITALS — BP 152/88 | HR 62 | Ht 65.5 in | Wt 242.2 lb

## 2024-08-11 DIAGNOSIS — R002 Palpitations: Secondary | ICD-10-CM | POA: Diagnosis not present

## 2024-08-11 DIAGNOSIS — R0609 Other forms of dyspnea: Secondary | ICD-10-CM | POA: Diagnosis not present

## 2024-08-11 DIAGNOSIS — I10 Essential (primary) hypertension: Secondary | ICD-10-CM | POA: Diagnosis not present

## 2024-08-11 MED ORDER — PROPRANOLOL HCL 20 MG PO TABS
20.0000 mg | ORAL_TABLET | Freq: Two times a day (BID) | ORAL | 5 refills | Status: DC
  Filled 2024-08-11: qty 60, 30d supply, fill #0

## 2024-08-11 NOTE — Patient Instructions (Signed)
 Medication Instructions:  Your physician recommends that you continue on your current medications as directed. Please refer to the Current Medication list given to you today.  *If you need a refill on your cardiac medications before your next appointment, please call your pharmacy*  Lab Work: None ordered If you have labs (blood work) drawn today and your tests are completely normal, you will receive your results only by: MyChart Message (if you have MyChart) OR A paper copy in the mail If you have any lab test that is abnormal or we need to change your treatment, we will call you to review the results.  Testing/Procedures: Your physician has requested that you have an echocardiogram. Echocardiography is a painless test that uses sound waves to create images of your heart. It provides your doctor with information about the size and shape of your heart and how well your heart's chambers and valves are working. This procedure takes approximately one hour. There are no restrictions for this procedure. Please do NOT wear cologne, perfume, aftershave, or lotions (deodorant is allowed). Please arrive 15 minutes prior to your appointment time.  Please note: We ask at that you not bring children with you during ultrasound (echo/ vascular) testing. Due to room size and safety concerns, children are not allowed in the ultrasound rooms during exams. Our front office staff cannot provide observation of children in our lobby area while testing is being conducted. An adult accompanying a patient to their appointment will only be allowed in the ultrasound room at the discretion of the ultrasound technician under special circumstances. We apologize for any inconvenience.   Follow-Up: At [facility], you and your health needs are our priority.  As part of our continuing mission to provide you with exceptional heart care, our providers are all part of one team.  This team includes your primary  Cardiologist (physician) and Advanced Practice Providers or APPs (Physician Assistants and Nurse Practitioners) who all work together to provide you with the care you need, when you need it.  Your next appointment:   4 month(s)  Provider:   Dr Almet[place]

## 2024-08-13 ENCOUNTER — Ambulatory Visit: Attending: Cardiology | Admitting: Cardiology

## 2024-08-13 ENCOUNTER — Other Ambulatory Visit (HOSPITAL_COMMUNITY): Payer: Self-pay

## 2024-08-13 ENCOUNTER — Encounter (HOSPITAL_COMMUNITY): Admitting: Occupational Therapy

## 2024-08-13 ENCOUNTER — Encounter: Payer: Self-pay | Admitting: Cardiology

## 2024-08-13 ENCOUNTER — Other Ambulatory Visit: Payer: Self-pay

## 2024-08-13 VITALS — BP 173/99 | HR 61 | Resp 16 | Ht 65.0 in | Wt 241.0 lb

## 2024-08-13 DIAGNOSIS — R0602 Shortness of breath: Secondary | ICD-10-CM | POA: Diagnosis not present

## 2024-08-13 DIAGNOSIS — I471 Supraventricular tachycardia, unspecified: Secondary | ICD-10-CM | POA: Diagnosis not present

## 2024-08-13 DIAGNOSIS — I10 Essential (primary) hypertension: Secondary | ICD-10-CM | POA: Diagnosis not present

## 2024-08-13 DIAGNOSIS — R072 Precordial pain: Secondary | ICD-10-CM

## 2024-08-13 DIAGNOSIS — I83813 Varicose veins of bilateral lower extremities with pain: Secondary | ICD-10-CM | POA: Diagnosis present

## 2024-08-13 DIAGNOSIS — F17210 Nicotine dependence, cigarettes, uncomplicated: Secondary | ICD-10-CM

## 2024-08-13 MED ORDER — PROPRANOLOL HCL 20 MG PO TABS
20.0000 mg | ORAL_TABLET | Freq: Two times a day (BID) | ORAL | 3 refills | Status: AC
  Filled 2024-08-13 – 2024-09-07 (×2): qty 180, 90d supply, fill #0
  Filled 2024-12-05: qty 180, 90d supply, fill #1

## 2024-08-13 MED ORDER — AMLODIPINE BESYLATE 10 MG PO TABS
10.0000 mg | ORAL_TABLET | Freq: Every day | ORAL | 3 refills | Status: AC
  Filled 2024-08-13: qty 90, 90d supply, fill #0
  Filled 2024-11-14: qty 90, 90d supply, fill #1

## 2024-08-13 NOTE — Progress Notes (Signed)
 Cardiology Office Note:    Date:  08/13/2024  NAME:  Rachel Underwood    MRN: [id number] DOB:  06-14-1971   PCP:  Zarwolo, Gloria, FNP  Former Cardiology Providers: Pearla Rout, MD Primary Cardiologist:  Madonna Large, DO, [facility] (established care 08/13/2024) Electrophysiologist:  Danelle Birmingham, MD   Referring MD: Zarwolo, Gloria, FNP  Reason of Consult: Shortness of breath & Leg Swelling  Chief Complaint  Patient presents with   Shortness of Breath   BLE swelling    History of Present Illness:    Rachel Underwood is a 53 y.o. African-American female whose past medical history and cardiovascular risk factors includes: Supraventricular tachycardia, hypertension,Carpal tunnel syndrome, bipolar disorder, HIV, HSV, Lupus (per patient), hx of polysubstance abuse, cigarette smoking. She is being seen today for the evaluation of establish care, shortness of breath and leg swelling at the request of Zarwolo, Gloria, FNP.  He is being followed by EP given her history of supraventricular tachycardia.  Recently seen by Jodie Passey 2 days ago during which time she was endorsing mild shortness of breath and lower extremity swelling.  She is able to ambulate 20 to 30 minutes without any issues.  She is referred back to general cardiology to reestablish care.  Shortness of breath More noticeable with effort related activities. Improves with resting. More noticeable also with higher temperatures during the summer months. Denies orthopnea, PND.  Trace lower extremity swelling  Chest Pain: Intermittent  Substernal location  Occurs randomly  Intensity: 4/10 At times brought on by effort related activities. Improves with resting and relaxing   Patient states that his symptoms are likely more pronounced as she has been off of her blood pressure medications for the last 3 weeks.  She is on propranolol  for history of SVT and when she has palpitations for a prolonged period of time she experiences chest  pain which may be contributory.    Echocardiogram was ordered, scheduled for 08/18/2024.  Patient also endorses painful varicose veins bilaterally and requesting further workup.  Current Medications: Current Meds  Medication Sig   albuterol  (VENTOLIN  HFA) 108 (90 Base) MCG/ACT inhaler INHALE 2 PUFFS INTO THE LUNGS EVERY 4 HOURS AS NEEDED FOR WHEEZING OR SHORTNESS OF BREATH.   bictegravir-emtricitabine -tenofovir  AF (BIKTARVY ) 50-200-25 MG TABS tablet Take 1 tablet by mouth daily.   cariprazine  (VRAYLAR ) 3 MG capsule Take 1 capsule (3 mg total) by mouth every evening.   cromolyn  (OPTICROM ) 4 % ophthalmic solution Place 1 drop into both eyes daily.   cycloSPORINE  (RESTASIS ) 0.05 % ophthalmic emulsion Place 1 drop into both eyes 2 (two) times daily.   Ensure (ENSURE) Take 237 mLs by mouth 2 (two) times daily between meals.   EPINEPHrine  (EPIPEN  2-PAK) 0.3 mg/0.3 mL IJ SOAJ injection Inject 1 pen (0.3 mg) into the skin as directed as needed for anaphylaxis.   eszopiclone  (LUNESTA ) 1 MG TABS tablet Take 1 tablet (1 mg total) by mouth at bedtime, for sleep.   hydrOXYzine  (ATARAX ) 50 MG tablet Take 1-2 tablets (50-100 mg total) by mouth every 12 (twelve) hours as needed.   ibuprofen  (ADVIL ) 800 MG tablet Take 1 tablet (800 mg total) by mouth every 8 (eight) hours as needed.   lamoTRIgine  (LAMICTAL ) 200 MG tablet Take 200 mg by mouth daily.   linaclotide  (LINZESS ) 290 MCG CAPS capsule Take 1 capsule by mouth daily on empty stomach   methocarbamol  (ROBAXIN ) 500 MG tablet Take 1 tablet (500 mg total) by mouth 4 (four) times daily.  naproxen  (NAPROSYN ) 500 MG tablet Take 1 tablet (500 mg total) by mouth 2 (two) times daily with a meal.   nystatin  (MYCOSTATIN /NYSTOP ) powder Apply topically 3 times daily for up to 2 weeks for rash under breast.   ondansetron  (ZOFRAN -ODT) 4 MG disintegrating tablet Dissolve 1 tablet (4 mg total) by mouth 3 (three) times daily as needed.   pantoprazole  (PROTONIX ) 40 MG  tablet    rosuvastatin  (CRESTOR ) 10 MG tablet Take 1 tablet (10 mg total) by mouth daily.   terbinafine  (LAMISIL ) 250 MG tablet Take 1 tablet (250 mg total) by mouth daily.   valACYclovir  (VALTREX ) 1000 MG tablet Take 1 tablet (1,000 mg total) by mouth daily.   [DISCONTINUED] propranolol  (INDERAL ) 20 MG tablet Take 1 tablet (20 mg total) by mouth 2 (two) times daily.     Allergies:    Flecainide , Morphine, Piroxicam, Abilify [aripiprazole], Citalopram, Codeine, Depakote [divalproex sodium], Gabapentin, Haldol [haloperidol lactate], Haloperidol, Indomethacin, Invega [paliperidone], Latuda  [lurasidone  hcl], Lithium, Lurasidone , Promethazine , Trazodone and nefazodone, and Lexapro [escitalopram oxalate]   Past Medical History: Past Medical History:  Diagnosis Date   Abnormal Pap smear of cervix 2005(EST)   ckc for abnl pap- [facility] [place]   Allergy    Asthma    Bipolar 1 disorder (HCC)    Borderline personality disorder (HCC)    BPD (bronchopulmonary dysplasia)    Carpal tunnel syndrome    bilateral wrist   Depression    Dysmenorrhea 06/03/2014   Enlarged uterus 06/03/2014   Fibromyalgia    GERD (gastroesophageal reflux disease)    Herpes simplex without mention of complication    HIV (human immunodeficiency virus infection) (HCC)    Hyperlipidemia    Hypertension    Menorrhagia with irregular cycle 06/03/2014   Osteoarthritis    Seizures (HCC)    had as a teenager, unknown etiology and no meds.    Past Surgical History: Past Surgical History:  Procedure Laterality Date   ABDOMINAL HYSTERECTOMY N/A 09/01/2014   Procedure: HYSTERECTOMY ABDOMINAL;  Surgeon: Norleen Edsel GAILS, MD;  Location: [facility];  Service: Gynecology;  Laterality: N/A;   ANKLE SURGERY Right    repair of ligaments   BACK SURGERY     BILATERAL SALPINGECTOMY Bilateral 09/01/2014   Procedure: BILATERAL SALPINGECTOMY;  Surgeon: Norleen Edsel GAILS, MD;  Location: [facility];  Service: Gynecology;  Laterality:  Bilateral;   CESAREAN SECTION     ckc     COLONOSCOPY N/A 12/23/2014   MFM:jwjo canal hemorrhoids left-sided diverticula   ESOPHAGOGASTRODUODENOSCOPY N/A 12/23/2014   MFM:zmndpcz reflux/noncritical shatzkis ring s/p dilation/small HH   EXCISION OF SKIN TAG N/A 09/01/2014   Procedure: EXCISION OF SKIN NEVUS;  Surgeon: Norleen Edsel GAILS, MD;  Location: [facility];  Service: Gynecology;  Laterality: N/A;   FRACTURE SURGERY     MALONEY DILATION N/A 12/23/2014   Procedure: AGAPITO DILATION;  Surgeon: Lamar CHRISTELLA Hollingshead, MD;  Location: [facility];  Service: Endoscopy;  Laterality: N/A;   SAVORY DILATION N/A 12/23/2014   Procedure: SAVORY DILATION;  Surgeon: Lamar CHRISTELLA Hollingshead, MD;  Location: [facility];  Service: Endoscopy;  Laterality: N/A;   SCAR REVISION N/A 09/01/2014   Procedure: EXCISION OF CICATRIX;  Surgeon: Norleen Edsel GAILS, MD;  Location: [facility];  Service: Gynecology;  Laterality: N/A;   SHOULDER SURGERY Right    rotator cuff   SPINE SURGERY      Social History: Social History   Tobacco Use   Smoking status: Every Day  Types: E-cigarettes, Cigarettes   Smokeless tobacco: Never   Tobacco comments:    1 PPD  Vaping Use   Vaping status: Never Used  Substance Use Topics   Alcohol use: Not Currently    Comment: Very arely   Drug use: No    Types: Crack cocaine    Comment: last cocaine use 11/2022    Family History: Family History  Problem Relation Age of Onset   Fibroids Mother    Hypertension Mother    Depression Mother    Irritable bowel syndrome Mother    Arthritis Mother    Heart disease Mother    Mental illness Mother    Stroke Mother    Vision loss Mother    Lupus Mother    Hypertension Father    Mental illness Father    Arthritis Father    Heart disease Father    Vision loss Father    Heart disease Maternal Grandmother    Cancer Maternal Grandmother        breast   Depression Maternal Grandmother    Hyperlipidemia Maternal Grandmother    Hypertension Maternal  Grandmother    Bipolar disorder Daughter    Schizophrenia Daughter    Lupus Daughter    Cancer Paternal Grandfather        prostate   Heart disease Paternal Grandfather    Hyperlipidemia Paternal Grandfather    Hypertension Paternal Grandfather    Dementia Paternal Grandmother    Hyperlipidemia Paternal Grandmother    Hypertension Paternal Grandmother    Heart disease Maternal Grandfather    Alzheimer's disease Maternal Grandfather    Hyperlipidemia Maternal Grandfather    Hypertension Maternal Grandfather    Anxiety disorder Daughter    ADD / ADHD Son    Hypertension Brother    Early death Brother    Learning disabilities Brother    Mental illness Brother    Colon cancer Neg Hx     ROS:   Review of Systems  Cardiovascular:  Positive for chest pain (see HPI) and dyspnea on exertion (see HPI). Negative for claudication, irregular heartbeat, leg swelling, near-syncope, orthopnea, palpitations, paroxysmal nocturnal dyspnea and syncope.  Respiratory:  Negative for shortness of breath.   Hematologic/Lymphatic: Negative for bleeding problem.    EKGs/Labs/Other Studies Reviewed:   EKG: August 11, 2024: Sinus rhythm, 62 bpm, without ectopy  Echocardiogram: Pending, 08/18/2024  Stress Testing:  Will order  Labs:    Latest Ref Rng & Units 03/26/2023   10:16 AM 01/01/2023   10:40 AM 10/23/2022   10:59 AM  CBC  WBC 3.8 - 10.8 Thousand/uL 6.3  6.6  8.1   Hemoglobin 11.7 - 15.5 g/dL 87.6  86.3  85.6   Hematocrit 35.0 - 45.0 % 36.1  38.8  42.3   Platelets 140 - 400 Thousand/uL 184  221  229        Latest Ref Rng & Units 03/26/2023   10:16 AM 01/01/2023   10:40 AM 10/23/2022   10:59 AM  BMP  Glucose 65 - 99 mg/dL 90  88  897   BUN 7 - 25 mg/dL 8  15  13    Creatinine 0.50 - 0.99 mg/dL 9.30  9.30  9.31   BUN/Creat Ratio 6 - 22 (calc) SEE NOTE:  SEE NOTE:    Sodium 135 - 146 mmol/L 141  140  138   Potassium 3.5 - 5.3 mmol/L 4.1  4.2  4.3   Chloride 98 - 110 mmol/L 106   105  106   CO2 20 - 32 mmol/L 30  30  27    Calcium  8.6 - 10.2 mg/dL 9.1  9.7  9.5       Latest Ref Rng & Units 03/26/2023   10:16 AM 01/01/2023   10:40 AM 10/23/2022   10:59 AM  CMP  Glucose 65 - 99 mg/dL 90  88  897   BUN 7 - 25 mg/dL 8  15  13    Creatinine 0.50 - 0.99 mg/dL 9.30  9.30  9.31   Sodium 135 - 146 mmol/L 141  140  138   Potassium 3.5 - 5.3 mmol/L 4.1  4.2  4.3   Chloride 98 - 110 mmol/L 106  105  106   CO2 20 - 32 mmol/L 30  30  27    Calcium  8.6 - 10.2 mg/dL 9.1  9.7  9.5   Total Protein 6.1 - 8.1 g/dL 6.4  7.1  7.7   Total Bilirubin 0.2 - 1.2 mg/dL 0.4  0.7  0.4   Alkaline Phos 38 - 126 U/L   54   AST 10 - 35 U/L 11  16  15    ALT 6 - 29 U/L 7  11  15      Lab Results  Component Value Date   CHOL 123 03/26/2023   HDL 46 (L) 03/26/2023   LDLCALC 58 03/26/2023   TRIG 103 03/26/2023   CHOLHDL 2.7 03/26/2023   No results for input(s): LIPOA in the last 8760 hours. No components found for: NTPROBNP No results for input(s): PROBNP in the last 8760 hours. No results for input(s): TSH in the last 8760 hours.  Physical Exam:    Today's Vitals   08/13/24 1055  BP: (!) 173/99  Pulse: 61  Resp: 16  SpO2: 97%  Weight: 241 lb (109.3 kg)  Height: 5' 5 (1.651 m)   Body mass index is 40.1 kg/m. Wt Readings from Last 3 Encounters:  08/13/24 241 lb (109.3 kg)  08/11/24 242 lb 3.2 oz (109.9 kg)  07/30/24 232 lb (105.2 kg)    Physical Exam  Constitutional: No distress.  hemodynamically stable  Neck: No JVD present.  Cardiovascular: Normal rate, regular rhythm, S1 normal and S2 normal. Exam reveals no gallop, no S3 and no S4.  No murmur heard. Pulmonary/Chest: Effort normal and breath sounds normal. No stridor. She has no wheezes. She has no rales.  Musculoskeletal:        General: Edema (trace) present.     Cervical back: Neck supple.     Comments: Varicose veins - bilateral, painful at times.   Skin: Skin is warm.     Impression &  Recommendation(s):  Impression:   ICD-10-CM   1. Precordial pain  R07.2 ECHOCARDIOGRAM STRESS TEST    propranolol  (INDERAL ) 20 MG tablet    amLODipine  (NORVASC ) 10 MG tablet    Cardiac Stress Test: Informed Consent Details: Physician/Practitioner Attestation; Transcribe to consent form and obtain patient signature    2. SOB (shortness of breath)  R06.02 EKG 12-Lead    Cardiac Stress Test: Informed Consent Details: Physician/Practitioner Attestation; Transcribe to consent form and obtain patient signature    3. PSVT (paroxysmal supraventricular tachycardia) (HCC)  I47.10     4. Primary hypertension  I10 propranolol  (INDERAL ) 20 MG tablet    amLODipine  (NORVASC ) 10 MG tablet    5. Varicose veins of both lower extremities with pain  I83.813 Ambulatory referral to Vascular Surgery    6. Cigarette smoker  F17.210  Recommendation(s):  Precordial pain SOB (shortness of breath) Has been experiencing chest pain and shortness of breath for the last 3 weeks, coinciding when she ran out of her blood pressure pills. Symptoms suggestive of cardiac etiology. Currently asymptomatic. Recent EKG was nonischemic. Echocardiogram pending. Stress echo will be ordered to evaluate for regional wall motion abnormalities and exercise-induced arrhythmia/ischemia Reemphasize importance of improving her modifiable cardiovascular risk factors which includes but not limited to blood pressure management, complete smoking cessation, continued abstinence from recreational drugs.  PSVT (paroxysmal supraventricular tachycardia) (HCC) Currently on propranolol  20 mg p.o. twice daily, prescription refilled for 30 days and after that we will be filled by PCP. Follows with EP  Primary hypertension Office blood pressures are now well-controlled. Offered her to have blood pressure medications filled downstairs prior to discharge, patient denies Refill on amlodipine  10 mg p.o. daily. Advised her to check her  blood pressures at home. Would like to defer blood pressure medication titration and refills to her PCP going forward.  Patient agreeable with the plan of care Reemphasized importance of low-salt diet.  Varicose veins of both lower extremities with pain Bilateral lower extremity varicosities noted. Trace swelling at the ankles. Ideally needs a venous lower extremity duplex to evaluate for reflux; however, patient would like to be referred to vascular and vein for further evaluation and management.  Cigarette smoking Tobacco cessation counseling: Currently smoking 5 cigarettes/day.  Patient denies claudication Patient is informed to follow-up with PCP and consider lung cancer screening if and when appropriate. She is informed of the dangers of tobacco abuse including stroke, cancer, and MI, as well as benefits of tobacco cessation. She is willing to quit at this time. 3 mins were spent counseling patient cessation techniques. We discussed various methods to help quit smoking, including deciding on a date to quit, joining a support group, pharmacological agents- nicotine gum/patch/lozenges.  I will reassess her progress at the next follow-up visit   Orders Placed:  Orders Placed This Encounter  Procedures   Ambulatory referral to Vascular Surgery    Referral Priority:   Routine    Referral Type:   Surgical    Referral Reason:   Specialty Services Required    Requested Specialty:   Vascular Surgery    Number of Visits Requested:   1   Cardiac Stress Test: Informed Consent Details: Physician/Practitioner Attestation; Transcribe to consent form and obtain patient signature    Physician/Practitioner attestation of informed consent for procedure/surgical case:   I, the physician/practitioner, attest that I have discussed with the patient the benefits, risks, side effects, alternatives, likelihood of achieving goals and potential problems during recovery for the procedure that I have provided  informed consent.    Procedure:   Stress echocardiogram    Indication/Reason:   Chest pain and shortness of breath   EKG 12-Lead   ECHOCARDIOGRAM STRESS TEST    Standing Status:   Future    Expiration Date:   08/13/2025    Where [facility] this be performed[facility]    Perflutren DEFINITY (image enhancing agent) should be administered unless hypersensitivity or allergy exist:   Administer Perflutren    Stress with pharmacologic or treadmill ?:   Treadmill w/ exercise     Final Medication List:    Meds ordered this encounter  Medications   propranolol  (INDERAL ) 20 MG tablet    Sig: Take 1 tablet (20 mg total) by mouth 2 (two) times daily.    Dispense:  180  tablet    Refill:  3   amLODipine  (NORVASC ) 10 MG tablet    Sig: Take 1 tablet (10 mg total) by mouth daily.    Dispense:  90 tablet    Refill:  3    Medications Discontinued During This Encounter  Medication Reason   amLODipine  (NORVASC ) 10 MG tablet Reorder   propranolol  (INDERAL ) 20 MG tablet Reorder     Current Outpatient Medications:    albuterol  (VENTOLIN  HFA) 108 (90 Base) MCG/ACT inhaler, INHALE 2 PUFFS INTO THE LUNGS EVERY 4 HOURS AS NEEDED FOR WHEEZING OR SHORTNESS OF BREATH., Disp: 18 g, Rfl: 2   bictegravir-emtricitabine -tenofovir  AF (BIKTARVY ) 50-200-25 MG TABS tablet, Take 1 tablet by mouth daily., Disp: 30 tablet, Rfl: 5   cariprazine  (VRAYLAR ) 3 MG capsule, Take 1 capsule (3 mg total) by mouth every evening., Disp: 30 capsule, Rfl: 0   cromolyn  (OPTICROM ) 4 % ophthalmic solution, Place 1 drop into both eyes daily., Disp: 10 mL, Rfl: 3   cycloSPORINE  (RESTASIS ) 0.05 % ophthalmic emulsion, Place 1 drop into both eyes 2 (two) times daily., Disp: 180 each, Rfl: 2   Ensure (ENSURE), Take 237 mLs by mouth 2 (two) times daily between meals., Disp: 237 mL, Rfl: 6   EPINEPHrine  (EPIPEN  2-PAK) 0.3 mg/0.3 mL IJ SOAJ injection, Inject 1 pen (0.3 mg) into the skin as directed as needed for anaphylaxis., Disp: 2  each, Rfl: 1   eszopiclone  (LUNESTA ) 1 MG TABS tablet, Take 1 tablet (1 mg total) by mouth at bedtime, for sleep., Disp: 30 tablet, Rfl: 1   hydrOXYzine  (ATARAX ) 50 MG tablet, Take 1-2 tablets (50-100 mg total) by mouth every 12 (twelve) hours as needed., Disp: 60 tablet, Rfl: 2   ibuprofen  (ADVIL ) 800 MG tablet, Take 1 tablet (800 mg total) by mouth every 8 (eight) hours as needed., Disp: 30 tablet, Rfl: 0   lamoTRIgine  (LAMICTAL ) 200 MG tablet, Take 200 mg by mouth daily., Disp: , Rfl:    linaclotide  (LINZESS ) 290 MCG CAPS capsule, Take 1 capsule by mouth daily on empty stomach, Disp: 30 capsule, Rfl: 2   methocarbamol  (ROBAXIN ) 500 MG tablet, Take 1 tablet (500 mg total) by mouth 4 (four) times daily., Disp: 30 tablet, Rfl: 1   naproxen  (NAPROSYN ) 500 MG tablet, Take 1 tablet (500 mg total) by mouth 2 (two) times daily with a meal., Disp: 60 tablet, Rfl: 2   nystatin  (MYCOSTATIN /NYSTOP ) powder, Apply topically 3 times daily for up to 2 weeks for rash under breast., Disp: 30 g, Rfl: 1   ondansetron  (ZOFRAN -ODT) 4 MG disintegrating tablet, Dissolve 1 tablet (4 mg total) by mouth 3 (three) times daily as needed., Disp: 20 tablet, Rfl: 3   pantoprazole  (PROTONIX ) 40 MG tablet, , Disp: , Rfl:    rosuvastatin  (CRESTOR ) 10 MG tablet, Take 1 tablet (10 mg total) by mouth daily., Disp: 30 tablet, Rfl: 2   terbinafine  (LAMISIL ) 250 MG tablet, Take 1 tablet (250 mg total) by mouth daily., Disp: 30 tablet, Rfl: 4   valACYclovir  (VALTREX ) 1000 MG tablet, Take 1 tablet (1,000 mg total) by mouth daily., Disp: 30 tablet, Rfl: 5   amLODipine  (NORVASC ) 10 MG tablet, Take 1 tablet (10 mg total) by mouth daily., Disp: 90 tablet, Rfl: 3   propranolol  (INDERAL ) 20 MG tablet, Take 1 tablet (20 mg total) by mouth 2 (two) times daily., Disp: 180 tablet, Rfl: 3  Consent:   Informed Consent   Shared Decision Making/Informed Consent The risks [chest pain, shortness of breath,  cardiac arrhythmias, dizziness, blood  pressure fluctuations, myocardial infarction, stroke/transient ischemic attack, and life-threatening complications (estimated to be 1 in 10,000)], benefits (risk stratification, diagnosing coronary artery disease, treatment guidance) and alternatives of a stress or dobutamine stress echocardiogram were discussed in detail with Ms. Pascal and she agrees to proceed.     Disposition:   1 year follow-up sooner if needed  Her questions and concerns were addressed to her satisfaction. She voices understanding of the recommendations provided during this encounter.    Signed, Madonna Michele HAS, [facility]  A Division of [facility] [address] [place], [place] [address]  08/13/2024 1:04 PM

## 2024-08-13 NOTE — Patient Instructions (Signed)
 Medication Instructions:  Continue same medications *If you need a refill on your cardiac medications before your next appointment, please call your pharmacy*  Lab Work: None ordered  Testing/Procedures: Stress Echo  ( Do Not schedule on same day as echo already scheduled )  Follow-Up: At [facility], you and your health needs are our priority.  As part of our continuing mission to provide you with exceptional heart care, our providers are all part of one team.  This team includes your primary Cardiologist (physician) and Advanced Practice Providers or APPs (Physician Assistants and Nurse Practitioners) who all work together to provide you with the care you need, when you need it.  Your next appointment:  1 year   Call in May to schedule Sept appointment     Provider:  Dr.Tolia            Vascular Vein Specialist   will call back with appointment        We recommend signing up for the patient portal called MyChart.  Sign up information is provided on this After Visit Summary.  MyChart is used to connect with patients for Virtual Visits (Telemedicine).  Patients are able to view lab/test results, encounter notes, upcoming appointments, etc.  Non-urgent messages can be sent to your provider as well.   To learn more about what you can do with MyChart, go to [email or web address].

## 2024-08-14 ENCOUNTER — Other Ambulatory Visit: Payer: Self-pay

## 2024-08-14 ENCOUNTER — Other Ambulatory Visit (HOSPITAL_COMMUNITY): Payer: Self-pay

## 2024-08-14 ENCOUNTER — Other Ambulatory Visit: Payer: Self-pay | Admitting: Pharmacist

## 2024-08-14 ENCOUNTER — Other Ambulatory Visit: Payer: Self-pay | Admitting: Pharmacy Technician

## 2024-08-14 MED ORDER — BIKTARVY 50-200-25 MG PO TABS
1.0000 | ORAL_TABLET | Freq: Every day | ORAL | 5 refills | Status: DC
  Filled 2024-09-16 – 2024-09-17 (×2): qty 30, 30d supply, fill #0
  Filled 2024-10-09: qty 30, 30d supply, fill #1
  Filled 2024-11-25: qty 30, 30d supply, fill #2
  Filled 2024-12-31: qty 30, 30d supply, fill #3

## 2024-08-14 MED ORDER — LAMOTRIGINE 200 MG PO TABS
200.0000 mg | ORAL_TABLET | Freq: Every day | ORAL | 2 refills | Status: DC
  Filled 2024-08-14: qty 30, 30d supply, fill #0

## 2024-08-14 MED ORDER — QUETIAPINE FUMARATE 200 MG PO TABS
200.0000 mg | ORAL_TABLET | Freq: Every day | ORAL | 2 refills | Status: DC
  Filled 2024-08-14: qty 30, 30d supply, fill #0

## 2024-08-14 MED ORDER — SERTRALINE HCL 100 MG PO TABS
200.0000 mg | ORAL_TABLET | Freq: Every day | ORAL | 3 refills | Status: DC
  Filled 2024-08-14: qty 60, 30d supply, fill #0

## 2024-08-14 NOTE — Progress Notes (Signed)
 Specialty Pharmacy Initiation Note   Rachel Underwood is a 53 y.o. female who will be followed by the specialty pharmacy service for RxSp HIV    Review of administration, indication, effectiveness, safety, potential side effects, storage/disposable, and missed dose instructions occurred today for patient's specialty medication(s) Bictegravir-Emtricitab-Tenofov (Biktarvy )     Patient/Caregiver did not have any additional questions or concerns.   Patient's therapy is appropriate to: Initiate    Goals Addressed             This Visit's Progress    Achieve Undetectable HIV Viral Load < 20       Patient is on track. Patient will work on increased adherence      Increase CD4 count until steady state       Patient is on track. Patient will adhere to provider and/or lab appointments      Maintain optimal adherence to therapy       Patient is not on track and improving. Patient will work on increased adherence         Alan JINNY Geralds Specialty Pharmacist

## 2024-08-14 NOTE — Progress Notes (Signed)
 Specialty Pharmacy Initial Fill Coordination Note  Rachel Underwood is a 53 y.o. female contacted today regarding initial fill of specialty medication(s) Bictegravir-Emtricitab-Tenofov (Biktarvy )   Patient requested Delivery   Delivery date: 08/18/24   Verified address: [address] [place] [address]   Medication will be filled on 08/15/24.   Patient is aware of $0 copayment.

## 2024-08-15 ENCOUNTER — Ambulatory Visit (HOSPITAL_COMMUNITY): Admitting: Occupational Therapy

## 2024-08-15 ENCOUNTER — Encounter (HOSPITAL_COMMUNITY): Payer: Self-pay | Admitting: Occupational Therapy

## 2024-08-15 DIAGNOSIS — R29898 Other symptoms and signs involving the musculoskeletal system: Secondary | ICD-10-CM

## 2024-08-15 DIAGNOSIS — M25511 Pain in right shoulder: Secondary | ICD-10-CM

## 2024-08-15 DIAGNOSIS — M25611 Stiffness of right shoulder, not elsewhere classified: Secondary | ICD-10-CM

## 2024-08-15 NOTE — Therapy (Signed)
 OUTPATIENT OCCUPATIONAL THERAPY ORTHO TREATMENT NOTE  Patient Name: Rachel Underwood MRN: [id number] DOB:08-23-1971, 53 y.o., female Today's Date: 08/15/2024   END OF SESSION:  OT End of Session - 08/15/24 1231     Visit Number 2    Number of Visits 8    Date for OT Re-Evaluation 09/12/24    Authorization Type [place] Medicaid    OT Start Time [phone]    OT Stop Time 0930    OT Time Calculation (min) 41 min    Activity Tolerance Patient tolerated treatment well    Behavior During Therapy WFL for tasks assessed/performed          Past Medical History:  Diagnosis Date   Abnormal Pap smear of cervix 2005(EST)   ckc for abnl pap- [facility] [place]   Allergy    Asthma    Bipolar 1 disorder (HCC)    Borderline personality disorder (HCC)    BPD (bronchopulmonary dysplasia)    Carpal tunnel syndrome    bilateral wrist   Depression    Dysmenorrhea 06/03/2014   Enlarged uterus 06/03/2014   Fibromyalgia    GERD (gastroesophageal reflux disease)    Herpes simplex without mention of complication    HIV (human immunodeficiency virus infection) (HCC)    Hyperlipidemia    Hypertension    Menorrhagia with irregular cycle 06/03/2014   Osteoarthritis    Seizures (HCC)    had as a teenager, unknown etiology and no meds.   Past Surgical History:  Procedure Laterality Date   ABDOMINAL HYSTERECTOMY N/A 09/01/2014   Procedure: HYSTERECTOMY ABDOMINAL;  Surgeon: Norleen Edsel GAILS, MD;  Location: [facility];  Service: Gynecology;  Laterality: N/A;   ANKLE SURGERY Right    repair of ligaments   BACK SURGERY     BILATERAL SALPINGECTOMY Bilateral 09/01/2014   Procedure: BILATERAL SALPINGECTOMY;  Surgeon: Norleen Edsel GAILS, MD;  Location: [facility];  Service: Gynecology;  Laterality: Bilateral;   CESAREAN SECTION     ckc     COLONOSCOPY N/A 12/23/2014   MFM:jwjo canal hemorrhoids left-sided diverticula   ESOPHAGOGASTRODUODENOSCOPY N/A 12/23/2014   MFM:zmndpcz reflux/noncritical shatzkis ring s/p  dilation/small HH   EXCISION OF SKIN TAG N/A 09/01/2014   Procedure: EXCISION OF SKIN NEVUS;  Surgeon: Norleen Edsel GAILS, MD;  Location: [facility];  Service: Gynecology;  Laterality: N/A;   FRACTURE SURGERY     MALONEY DILATION N/A 12/23/2014   Procedure: AGAPITO DILATION;  Surgeon: Lamar CHRISTELLA Hollingshead, MD;  Location: [facility];  Service: Endoscopy;  Laterality: N/A;   SAVORY DILATION N/A 12/23/2014   Procedure: SAVORY DILATION;  Surgeon: Lamar CHRISTELLA Hollingshead, MD;  Location: [facility];  Service: Endoscopy;  Laterality: N/A;   SCAR REVISION N/A 09/01/2014   Procedure: EXCISION OF CICATRIX;  Surgeon: Norleen Edsel GAILS, MD;  Location: [facility];  Service: Gynecology;  Laterality: N/A;   SHOULDER SURGERY Right    rotator cuff   SPINE SURGERY     Patient Active Problem List   Diagnosis Date Noted   Injury of head 03/15/2023   Respiratory infection 10/17/2022   Palpitations 10/03/2022   URI (upper respiratory infection) 08/18/2022   PSVT (paroxysmal supraventricular tachycardia) (HCC) 05/18/2022   Morbid obesity (HCC) 05/18/2022   Cocaine abuse (HCC) 05/18/2022   High risk sexual behavior 05/18/2022   Asthma 05/18/2022   Plantar wart 02/01/2021   Tobacco abuse disorder 02/01/2021   Positive ANA (antinuclear antibody) 01/28/2021   Arthritis 01/28/2021   Rash 01/28/2021  Fibromyalgia syndrome 01/28/2021   Bipolar disorder in partial remission (HCC) 11/14/2016   Bilateral carpal tunnel syndrome 02/17/2016   Allergic rhinitis due to pollen 02/17/2016   Headache 11/09/2015   Back pain with history of spinal surgery 11/09/2015   Diarrhea 05/19/2015   Cysts of both ovaries 05/12/2015   Abdominal wall hernia 02/03/2015   Abdominal wall mass of periumbilical region 02/03/2015   Schatzki's ring    Diverticulosis of colon without hemorrhage    Other hemorrhoids    Rectal bleeding 12/11/2014   GERD (gastroesophageal reflux disease) 12/11/2014   Esophageal dysphagia 12/11/2014   Constipation 12/11/2014    S/P hysterectomy 09/01/2014   Pelvic pain in female 07/22/2014   Anovulation 06/18/2014   DUB (dysfunctional uterine bleeding) 06/18/2014   Menorrhagia with irregular cycle 06/03/2014   Enlarged uterus 06/03/2014   HTN (hypertension) 01/15/2014   Human immunodeficiency virus (HIV) disease (HCC) 07/17/2013   Schizophrenia (HCC) 07/17/2013   Bipolar disorder, unspecified (HCC) 07/17/2013    PCP: Zarwolo, Gloria, FNP REFERRING PROVIDER: Margrette Bars, MD  ONSET DATE: 05/2024  REFERRING DIAG: R Shoulder Pain  THERAPY DIAG:  Right shoulder pain, unspecified chronicity  Shoulder stiffness, right  Other symptoms and signs involving the musculoskeletal system  Rationale for Evaluation and Treatment: Rehabilitation  SUBJECTIVE:   SUBJECTIVE STATEMENT: It is killing me Pt accompanied by: self  PERTINENT HISTORY: 53 year old female status post bilateral rotator cuff surgery arthroscopically for rotator cuff tears over 10 years ago secondary to an injury.  PRECAUTIONS: None  WEIGHT BEARING RESTRICTIONS: No  PAIN:  Are you having pain? Yes: NPRS scale: 8/10 Pain location: trapezius Pain description: throbbing and stabbing Aggravating factors: anything Relieving factors: nothing  FALLS: Has patient fallen in last 6 months? Yes. Number of falls 4  PLOF: Independent  PATIENT GOALS: To decrease pain  NEXT MD VISIT: 09/11/24  OBJECTIVE:   HAND DOMINANCE: Right  ADLs: Overall ADLs: Pt having difficulty with dressing and unable to wash hair without severe pain. She is limited with grooming due to pain and stiffness. Pt unable to cook or clean at this time due to limited ability for lifting due to weakness and pain.  FUNCTIONAL OUTCOME MEASURES: Upper Extremity Functional Scale (UEFS): 32/80   Extreme difficulty/unable (0), Quite a bit of difficulty (1), Moderate difficulty (2), Little difficulty (3), No difficulty (4) Survey date:  08/07/24  Any of your usual work,  household or school activities 1  2. Your usual hobbies, recreational/sport activities 2   3. Lifting a bag of groceries to waist level 2   4. Lifting a bag of groceries above your head 1  5. Grooming your hair 1  6. Pushing up on your hands (I.e. from bathtub or chair) 1  7. Preparing food (I.e. peeling/cutting) 2  8. Driving  2  9. Vacuuming, sweeping, or raking 2  10. Dressing  1  11. Doing up buttons 3  12. Using tools/appliances 2  13. Opening doors 2  14. Cleaning  2  15. Tying or lacing shoes 3  16. Sleeping  0  17. Laundering clothes (I.e. washing, ironing, folding) 2  18. Opening a jar 1  19. Throwing a ball 1  20. Carrying a small suitcase with your affected limb.  1  Score total:  32/80     UPPER EXTREMITY ROM:       Assessed in seated, er/IR adducted  Active ROM Right eval  Shoulder flexion 146  Shoulder abduction 124  Shoulder internal rotation 90  Shoulder external rotation 20  (Blank rows = not tested)    UPPER EXTREMITY MMT:     Assessed in seated, er/IR adducted  MMT Right eval  Shoulder flexion 4/5  Shoulder abduction 4-/5  Shoulder internal rotation 4-/5  Shoulder external rotation 3+/5  (Blank rows = not tested)  SENSATION: Tingling noted from elbow down to hands  EDEMA: No swelling noted  OBSERVATIONS: Moderate fascial restrictions noted along the bicep, trapezius, and cervical region   TODAY'S TREATMENT:                                                                                                                              DATE:   08/15/24 -manual therapy: myofascial release and trigger point applied to biceps, deltoid, pectoralis, trapezius, and scapular region in order to reduce fascial restrictions and pain, as well as improve ROM.  -P/ROM: supine, flexion, abduction, er/IR, x10 -A/ROM: supine, flexion, abduction, protraction, horizontal abduction, er/IR, x10 -Isometrics: flexion, abduction, extension, IR, 4x15 -Proximal  Shoulder Exercises: paddles, criss cross, circles both directions, x10 each  08/07/24 AA/ROM: seated, flexion, abduction, protraction, horizontal abduction, er/IR, x10 Cervical ROM:  flexion, extension, lateral rotations, head tilts, x10    PATIENT EDUCATION: Education details: Isometrics Person educated: Patient Education method: Programmer, multimedia, Facilities manager, and Handouts Education comprehension: verbalized understanding and returned demonstration  HOME EXERCISE PROGRAM: 9/4: AA/ROM, Cervical ROM 9/12: Isometrics  GOALS: Goals reviewed with patient? Yes   SHORT TERM GOALS: Target date: 09/12/24  Pt will be provided with and educated on HEP to improve mobility in RUE required for use during ADL completion.   Goal status: IN PROGRESS  LONG TERM GOALS: Target date: 09/12/24  Pt will decrease pain in RUE to 3/10 or less to improve ability to sleep for 2+ consecutive hours without waking due to pain.   Goal status: IN PROGRESS  2.  Pt will decrease RUE fascial restrictions to min amounts or less to improve mobility required for functional reaching tasks.   Goal status: IN PROGRESS  3.  Pt will increase RUE A/ROM by 20 degrees to improve ability to use RUE when reaching overhead or behind back during dressing and bathing tasks.   Goal status: IN PROGRESS  4.  Pt will increase RUE strength to 5/5 or greater to improve ability to use RUE when lifting or carrying items during meal preparation/housework/yardwork tasks.   Goal status: IN PROGRESS  5.  Pt will return to highest level of function using RUE as dominant during functional task completion.   Goal status: IN PROGRESS   ASSESSMENT:  CLINICAL IMPRESSION: This session pt continues to have increased pain and instability in her shoulder. OT added isometrics and proximal shoulder exercises to improve stability and decrease pain. Verbal and tactile cuing provided for positioning and technique.   PERFORMANCE DEFICITS: in  functional skills including in functional skills including ADLs, IADLs, coordination, tone, ROM, strength, pain, fascial restrictions, muscle spasms, and UE functional use.  PLAN:  OT FREQUENCY: 2x/week  OT DURATION: 4 weeks  PLANNED INTERVENTIONS: 97168 OT Re-evaluation, 97535 self care/ADL training, 02889 therapeutic exercise, 97530 therapeutic activity, 97112 neuromuscular re-education, 97140 manual therapy, 97035 ultrasound, 97010 moist heat, 97032 electrical stimulation (manual), passive range of motion, functional mobility training, energy conservation, coping strategies training, patient/family education, and DME and/or AE instructions  RECOMMENDED OTHER SERVICES: N/A  CONSULTED AND AGREED WITH PLAN OF CARE: Patient  PLAN FOR NEXT SESSION: Manual Therapy, P/ROM, AA/ROM, Proximal Shoulder Exercises  Valentin Nightingale, OTR/L [facility] [phone] Hiran Leard Jillyn Nightingale, OT 08/15/2024, 12:32 PM

## 2024-08-15 NOTE — Patient Instructions (Signed)
  Complete the following 2-3 a day. Hold for 10-15 seconds. Complete 5-6 sets for each.   1) SHOULDER - ISOMETRIC FLEXION  Gently push your fist forward into a wall with your elbow bent.    2) SHOULDER - ISOMETRIC EXTENSION  Gently push your a bent elbow back into a wall.    3) SHOULDER - ISOMETRIC INTERNAL ROTATION   Gently press your hand into a wall using the palm side of your hand.  Maintain a bent elbow the entire time.        4) SHOULDER - ISOMETRIC ADDUCTION  Gently push your elbow into the side of your body.   5) SHOULDER - ISOMETRIC ABDUCTION  Gently push your elbow out to the side into a wall with your elbow bent.

## 2024-08-18 ENCOUNTER — Ambulatory Visit (HOSPITAL_COMMUNITY)
Admission: RE | Admit: 2024-08-18 | Discharge: 2024-08-18 | Disposition: A | Discharge status: Home / Self Care | Source: Ambulatory Visit | Attending: Internal Medicine | Admitting: Internal Medicine

## 2024-08-18 DIAGNOSIS — R0609 Other forms of dyspnea: Secondary | ICD-10-CM | POA: Diagnosis not present

## 2024-08-18 LAB — ECHOCARDIOGRAM COMPLETE
Area-P 1/2: 3.22 cm2
S' Lateral: 3 cm

## 2024-08-19 ENCOUNTER — Ambulatory Visit (HOSPITAL_COMMUNITY): Admitting: Occupational Therapy

## 2024-08-19 ENCOUNTER — Ambulatory Visit: Payer: Self-pay | Admitting: Student

## 2024-08-20 NOTE — Telephone Encounter (Signed)
 Pt returning call to a nurse

## 2024-08-20 NOTE — Telephone Encounter (Signed)
 Patient returned staff call regarding results.

## 2024-08-21 ENCOUNTER — Encounter (HOSPITAL_COMMUNITY): Admitting: Occupational Therapy

## 2024-08-25 ENCOUNTER — Encounter (HOSPITAL_COMMUNITY): Admitting: Occupational Therapy

## 2024-08-26 ENCOUNTER — Encounter (HOSPITAL_COMMUNITY): Admitting: Occupational Therapy

## 2024-08-26 ENCOUNTER — Other Ambulatory Visit: Payer: Self-pay | Admitting: Pharmacy Technician

## 2024-08-26 ENCOUNTER — Other Ambulatory Visit: Payer: Self-pay

## 2024-08-26 ENCOUNTER — Other Ambulatory Visit (HOSPITAL_COMMUNITY): Payer: Self-pay

## 2024-08-26 MED ORDER — INGREZZA 60 MG PO CAPS
60.0000 mg | ORAL_CAPSULE | Freq: Every day | ORAL | 1 refills | Status: DC
  Filled 2024-09-07: qty 30, 30d supply, fill #0

## 2024-08-26 MED ORDER — HYDROXYZINE HCL 50 MG PO TABS
50.0000 mg | ORAL_TABLET | Freq: Three times a day (TID) | ORAL | 1 refills | Status: AC | PRN
  Filled 2024-08-26: qty 90, 30d supply, fill #0
  Filled 2024-09-07 – 2024-09-18 (×2): qty 90, 30d supply, fill #1

## 2024-08-26 MED ORDER — LAMOTRIGINE 100 MG PO TABS
100.0000 mg | ORAL_TABLET | Freq: Every evening | ORAL | 1 refills | Status: DC
  Filled 2024-08-26: qty 30, 30d supply, fill #0

## 2024-08-26 MED ORDER — LAMOTRIGINE 200 MG PO TABS
200.0000 mg | ORAL_TABLET | Freq: Every morning | ORAL | 1 refills | Status: DC
  Filled 2024-09-07: qty 30, 30d supply, fill #0

## 2024-08-26 MED ORDER — QUETIAPINE FUMARATE 50 MG PO TABS
50.0000 mg | ORAL_TABLET | Freq: Every evening | ORAL | 1 refills | Status: DC
  Filled 2024-08-26: qty 60, 30d supply, fill #0

## 2024-08-27 ENCOUNTER — Other Ambulatory Visit (HOSPITAL_COMMUNITY): Payer: Self-pay

## 2024-08-27 ENCOUNTER — Other Ambulatory Visit: Payer: Self-pay

## 2024-08-28 ENCOUNTER — Ambulatory Visit (HOSPITAL_COMMUNITY): Admitting: Occupational Therapy

## 2024-08-28 ENCOUNTER — Encounter (HOSPITAL_COMMUNITY): Payer: Self-pay | Admitting: Occupational Therapy

## 2024-08-28 DIAGNOSIS — M25511 Pain in right shoulder: Secondary | ICD-10-CM | POA: Diagnosis not present

## 2024-08-28 DIAGNOSIS — M25611 Stiffness of right shoulder, not elsewhere classified: Secondary | ICD-10-CM

## 2024-08-28 DIAGNOSIS — R29898 Other symptoms and signs involving the musculoskeletal system: Secondary | ICD-10-CM

## 2024-08-28 NOTE — Therapy (Signed)
 OUTPATIENT OCCUPATIONAL THERAPY ORTHO TREATMENT NOTE  Patient Name: Rachel Underwood MRN: [id number] DOB:1971-01-18, 53 y.o., female Today's Date: 08/28/2024   END OF SESSION:  OT End of Session - 08/28/24 1024     Visit Number 3    Number of Visits 8    Date for Recertification  09/12/24    Authorization Type [place] Medicaid    Authorization Time Period 3 visits approved 9/25-10/4    Authorization - Visit Number 1    Authorization - Number of Visits 3    OT Start Time [phone]    OT Stop Time 1032    OT Time Calculation (min) 39 min    Activity Tolerance Patient tolerated treatment well    Behavior During Therapy WFL for tasks assessed/performed           Past Medical History:  Diagnosis Date   Abnormal Pap smear of cervix 2005(EST)   [facility]c for abnl pap- [facility] [place]   Allergy    Asthma    Bipolar 1 disorder (HCC)    Borderline personality disorder (HCC)    BPD (bronchopulmonary dysplasia)    Carpal tunnel syndrome    bilateral wrist   Depression    Dysmenorrhea 06/03/2014   Enlarged uterus 06/03/2014   Fibromyalgia    GERD (gastroesophageal reflux disease)    Herpes simplex without mention of complication    HIV (human immunodeficiency virus infection) (HCC)    Hyperlipidemia    Hypertension    Menorrhagia with irregular cycle 06/03/2014   Osteoarthritis    Seizures (HCC)    had as a teenager, unknown etiology and no meds.   Past Surgical History:  Procedure Laterality Date   ABDOMINAL HYSTERECTOMY N/A 09/01/2014   Procedure: HYSTERECTOMY ABDOMINAL;  Surgeon: Norleen Edsel GAILS, MD;  Location: [facility];  Service: Gynecology;  Laterality: N/A;   ANKLE SURGERY Right    repair of ligaments   BACK SURGERY     BILATERAL SALPINGECTOMY Bilateral 09/01/2014   Procedure: BILATERAL SALPINGECTOMY;  Surgeon: Norleen Edsel GAILS, MD;  Location: [facility];  Service: Gynecology;  Laterality: Bilateral;   CESAREAN SECTION     ckc     COLONOSCOPY N/A 12/23/2014   MFM:jwjo canal  hemorrhoids left-sided diverticula   ESOPHAGOGASTRODUODENOSCOPY N/A 12/23/2014   MFM:zmndpcz reflux/noncritical shatzkis ring s/p dilation/small HH   EXCISION OF SKIN TAG N/A 09/01/2014   Procedure: EXCISION OF SKIN NEVUS;  Surgeon: Norleen Edsel GAILS, MD;  Location: [facility];  Service: Gynecology;  Laterality: N/A;   FRACTURE SURGERY     MALONEY DILATION N/A 12/23/2014   Procedure: AGAPITO DILATION;  Surgeon: Lamar CHRISTELLA Hollingshead, MD;  [facility];  Service: Endoscopy;  Laterality: N/A;   SAVORY DILATION N/A 12/23/2014   Procedure: SAVORY DILATION;  Surgeon: Lamar CHRISTELLA Hollingshead, MD;  [facility];  Service: Endoscopy;  Laterality: N/A;   SCAR REVISION N/A 09/01/2014   Procedure: EXCISION OF CICATRIX;  Surgeon: Norleen Edsel GAILS, MD;  Location: [facility];  Service: Gynecology;  Laterality: N/A;   SHOULDER SURGERY Right    rotator cuff   SPINE SURGERY     Patient Active Problem List   Diagnosis Date Noted   Injury of head 03/15/2023   Respiratory infection 10/17/2022   Palpitations 10/03/2022   URI (upper respiratory infection) 08/18/2022   PSVT (paroxysmal supraventricular tachycardia) 05/18/2022   Morbid obesity (HCC) 05/18/2022   Cocaine abuse (HCC) 05/18/2022   High risk sexual behavior 05/18/2022   Asthma 05/18/2022  Plantar wart 02/01/2021   Tobacco abuse disorder 02/01/2021   Positive ANA (antinuclear antibody) 01/28/2021   Arthritis 01/28/2021   Rash 01/28/2021   Fibromyalgia syndrome 01/28/2021   Bipolar disorder in partial remission 11/14/2016   Bilateral carpal tunnel syndrome 02/17/2016   Allergic rhinitis due to pollen 02/17/2016   Headache 11/09/2015   Back pain with history of spinal surgery 11/09/2015   Diarrhea 05/19/2015   Cysts of both ovaries 05/12/2015   Abdominal wall hernia 02/03/2015   Abdominal wall mass of periumbilical region 02/03/2015   Schatzki's ring    Diverticulosis of colon without hemorrhage    Other hemorrhoids    Rectal bleeding  12/11/2014   GERD (gastroesophageal reflux disease) 12/11/2014   Esophageal dysphagia 12/11/2014   Constipation 12/11/2014   S/P hysterectomy 09/01/2014   Pelvic pain in female 07/22/2014   Anovulation 06/18/2014   DUB (dysfunctional uterine bleeding) 06/18/2014   Menorrhagia with irregular cycle 06/03/2014   Enlarged uterus 06/03/2014   HTN (hypertension) 01/15/2014   Human immunodeficiency virus (HIV) disease (HCC) 07/17/2013   Schizophrenia (HCC) 07/17/2013   Bipolar disorder, unspecified (HCC) 07/17/2013    PCP: Zarwolo, Gloria, FNP REFERRING PROVIDER: Margrette Bars, MD  ONSET DATE: 05/2024  REFERRING DIAG: R Shoulder Pain  THERAPY DIAG:  Right shoulder pain, unspecified chronicity  Shoulder stiffness, right  Other symptoms and signs involving the musculoskeletal system  Rationale for Evaluation and Treatment: Rehabilitation  SUBJECTIVE:   SUBJECTIVE STATEMENT: S: It goes all the way up to my neck.   PERTINENT HISTORY: 53 year old female status post bilateral rotator cuff surgery arthroscopically for rotator cuff tears over 10 years ago secondary to an injury.  PRECAUTIONS: None  WEIGHT BEARING RESTRICTIONS: No  PAIN:  Are you having pain? Yes: NPRS scale: 7/10 Pain location: trapezius Pain description: throbbing and stabbing Aggravating factors: anything Relieving factors: nothing  FALLS: Has patient fallen in last 6 months? Yes. Number of falls 4  PLOF: Independent  PATIENT GOALS: To decrease pain  NEXT MD VISIT: 09/11/24  OBJECTIVE:   HAND DOMINANCE: Right  ADLs: Overall ADLs: Pt having difficulty with dressing and unable to wash hair without severe pain. She is limited with grooming due to pain and stiffness. Pt unable to cook or clean at this time due to limited ability for lifting due to weakness and pain.  FUNCTIONAL OUTCOME MEASURES: Upper Extremity Functional Scale (UEFS): 32/80   Extreme difficulty/unable (0), Quite a bit of  difficulty (1), Moderate difficulty (2), Little difficulty (3), No difficulty (4) Survey date:  08/07/24  Any of your usual work, household or school activities 1  2. Your usual hobbies, recreational/sport activities 2   3. Lifting a bag of groceries to waist level 2   4. Lifting a bag of groceries above your head 1  5. Grooming your hair 1  6. Pushing up on your hands (I.e. from bathtub or chair) 1  7. Preparing food (I.e. peeling/cutting) 2  8. Driving  2  9. Vacuuming, sweeping, or raking 2  10. Dressing  1  11. Doing up buttons 3  12. Using tools/appliances 2  13. Opening doors 2  14. Cleaning  2  15. Tying or lacing shoes 3  16. Sleeping  0  17. Laundering clothes (I.e. washing, ironing, folding) 2  18. Opening a jar 1  19. Throwing a ball 1  20. Carrying a small suitcase with your affected limb.  1  Score total:  32/80     UPPER EXTREMITY ROM:  Assessed in seated, er/IR adducted  Active ROM Right eval  Shoulder flexion 146  Shoulder abduction 124  Shoulder internal rotation 90  Shoulder external rotation 20  (Blank rows = not tested)    UPPER EXTREMITY MMT:     Assessed in seated, er/IR adducted  MMT Right eval  Shoulder flexion 4/5  Shoulder abduction 4-/5  Shoulder internal rotation 4-/5  Shoulder external rotation 3+/5  (Blank rows = not tested)  SENSATION: Tingling noted from elbow down to hands  EDEMA: No swelling noted  OBSERVATIONS: Moderate fascial restrictions noted along the bicep, trapezius, and cervical region   TODAY'S TREATMENT:                                                                                                                              DATE:  08/28/24 -manual therapy: myofascial release and trigger point applied to biceps, deltoid, pectoralis, trapezius, and scapular region in order to reduce fascial restrictions and pain, as well as improve ROM.  -A/ROM: supine, flexion, abduction, protraction, horizontal abduction,  er/IR, 10 reps -A/ROM: sitting- flexion, abduction, protraction, horizontal abduction, er/IR, 10 reps -Wall wash: 1'  -Pulleys: 1' flexion, 1' abduction  08/15/24 -manual therapy: myofascial release and trigger point applied to biceps, deltoid, pectoralis, trapezius, and scapular region in order to reduce fascial restrictions and pain, as well as improve ROM.  -P/ROM: supine, flexion, abduction, er/IR, x10 -A/ROM: supine, flexion, abduction, protraction, horizontal abduction, er/IR, x10 -Isometrics: flexion, abduction, extension, IR, 4x15 -Proximal Shoulder Exercises: paddles, criss cross, circles both directions, x10 each  08/07/24 AA/ROM: seated, flexion, abduction, protraction, horizontal abduction, er/IR, x10 Cervical ROM:  flexion, extension, lateral rotations, head tilts, x10    PATIENT EDUCATION: Education details: A/ROM Person educated: Patient Education method: Programmer, multimedia, Facilities manager, and Handouts Education comprehension: verbalized understanding and returned demonstration  HOME EXERCISE PROGRAM: 9/4: AA/ROM, Cervical ROM 9/12: Isometrics 9/25: A/ROM  GOALS: Goals reviewed with patient? Yes   SHORT TERM GOALS: Target date: 09/12/24  Pt will be provided with and educated on HEP to improve mobility in RUE required for use during ADL completion.   Goal status: IN PROGRESS  LONG TERM GOALS: Target date: 09/12/24  Pt will decrease pain in RUE to 3/10 or less to improve ability to sleep for 2+ consecutive hours without waking due to pain.   Goal status: IN PROGRESS  2.  Pt will decrease RUE fascial restrictions to min amounts or less to improve mobility required for functional reaching tasks.   Goal status: IN PROGRESS  3.  Pt will increase RUE A/ROM by 20 degrees to improve ability to use RUE when reaching overhead or behind back during dressing and bathing tasks.   Goal status: IN PROGRESS  4.  Pt will increase RUE strength to 5/5 or greater to improve  ability to use RUE when lifting or carrying items during meal preparation/housework/yardwork tasks.   Goal status: IN PROGRESS  5.  Pt will return to  highest level of function using RUE as dominant during functional task completion.   Goal status: IN PROGRESS   ASSESSMENT:  CLINICAL IMPRESSION: Pt reports continued high pain today with the rain last night. Continued with manual techniques, palpating trigger points at anterior shoulder, supraspinatus, and trapezius areas. Progressed to A/ROM in both supine and standing, updated HEP. In supine, pt with full ROM but painful, in standing pt tolerating ROM to approximately 50-60% due to pain. Rest breaks provided as needed due to pain. Verbal cuing for form and technique during session.     PERFORMANCE DEFICITS: in functional skills including in functional skills including ADLs, IADLs, coordination, tone, ROM, strength, pain, fascial restrictions, muscle spasms, and UE functional use.   PLAN:  OT FREQUENCY: 2x/week  OT DURATION: 4 weeks  PLANNED INTERVENTIONS: 97168 OT Re-evaluation, 97535 self care/ADL training, 02889 therapeutic exercise, 97530 therapeutic activity, 97112 neuromuscular re-education, 97140 manual therapy, 97035 ultrasound, 97010 moist heat, 97032 electrical stimulation (manual), passive range of motion, functional mobility training, energy conservation, coping strategies training, patient/family education, and DME and/or AE instructions   CONSULTED AND AGREED WITH PLAN OF CARE: Patient  PLAN FOR NEXT SESSION: Manual Therapy, P/ROM, AA/ROM to A/ROM,  Proximal Shoulder Exercises    Sonny Cory, OTR/L  [phone] 08/28/2024, 10:34 AM

## 2024-08-28 NOTE — Patient Instructions (Signed)

## 2024-08-29 ENCOUNTER — Other Ambulatory Visit: Payer: Self-pay

## 2024-08-29 NOTE — Progress Notes (Signed)
 [place]  capsules not followed by spec. Dis-enrolling program

## 2024-09-01 ENCOUNTER — Encounter (HOSPITAL_COMMUNITY): Payer: Self-pay | Admitting: Occupational Therapy

## 2024-09-01 ENCOUNTER — Ambulatory Visit (HOSPITAL_COMMUNITY): Admitting: Occupational Therapy

## 2024-09-01 DIAGNOSIS — R29898 Other symptoms and signs involving the musculoskeletal system: Secondary | ICD-10-CM

## 2024-09-01 DIAGNOSIS — M25611 Stiffness of right shoulder, not elsewhere classified: Secondary | ICD-10-CM

## 2024-09-01 DIAGNOSIS — M25511 Pain in right shoulder: Secondary | ICD-10-CM | POA: Diagnosis not present

## 2024-09-01 NOTE — Therapy (Signed)
 OUTPATIENT OCCUPATIONAL THERAPY ORTHO TREATMENT NOTE  Patient Name: Rachel Underwood MRN: [id number] DOB:04-25-1971, 53 y.o., female Today's Date: 09/01/2024   END OF SESSION:  OT End of Session - 09/01/24 1031     Visit Number 4    Number of Visits 8    Date for Recertification  09/12/24    Authorization Type  Medicaid    Authorization Time Period 3 visits approved 9/25-10/4    Authorization - Visit Number 2    Authorization - Number of Visits 3    OT Start Time [phone]    OT Stop Time 1031    OT Time Calculation (min) 43 min    Activity Tolerance Patient tolerated treatment well    Behavior During Therapy WFL for tasks assessed/performed          Past Medical History:  Diagnosis Date   Abnormal Pap smear of cervix 2005(EST)   ckc for abnl pap- [facility] [place]   Allergy    Asthma    Bipolar 1 disorder (HCC)    Borderline personality disorder (HCC)    BPD (bronchopulmonary dysplasia)    Carpal tunnel syndrome    bilateral wrist   Depression    Dysmenorrhea 06/03/2014   Enlarged uterus 06/03/2014   Fibromyalgia    GERD (gastroesophageal reflux disease)    Herpes simplex without mention of complication    HIV (human immunodeficiency virus infection) (HCC)    Hyperlipidemia    Hypertension    Menorrhagia with irregular cycle 06/03/2014   Osteoarthritis    Seizures (HCC)    had as a teenager, unknown etiology and no meds.   Past Surgical History:  Procedure Laterality Date   ABDOMINAL HYSTERECTOMY N/A 09/01/2014   Procedure: HYSTERECTOMY ABDOMINAL;  Surgeon: Norleen Edsel GAILS, MD;  Location: [facility];  Service: Gynecology;  Laterality: N/A;   ANKLE SURGERY Right    repair of ligaments   BACK SURGERY     BILATERAL SALPINGECTOMY Bilateral 09/01/2014   Procedure: BILATERAL SALPINGECTOMY;  Surgeon: Norleen Edsel GAILS, MD;  Location: [facility];  Service: Gynecology;  Laterality: Bilateral;   CESAREAN SECTION     ckc     COLONOSCOPY N/A 12/23/2014   MFM:jwjo canal  hemorrhoids left-sided diverticula   ESOPHAGOGASTRODUODENOSCOPY N/A 12/23/2014   MFM:zmndpcz reflux/noncritical shatzkis ring s/p dilation/small HH   EXCISION OF SKIN TAG N/A 09/01/2014   Procedure: EXCISION OF SKIN NEVUS;  Surgeon: Norleen Edsel GAILS, MD;  Location: [facility];  Service: Gynecology;  Laterality: N/A;   FRACTURE SURGERY     MALONEY DILATION N/A 12/23/2014   Procedure: AGAPITO DILATION;  Surgeon: Lamar CHRISTELLA Hollingshead, MD;  Location: [facility];  Service: Endoscopy;  Laterality: N/A;   SAVORY DILATION N/A 12/23/2014   Procedure: SAVORY DILATION;  Surgeon: Lamar CHRISTELLA Hollingshead, MD;  Location: [facility];  Service: Endoscopy;  Laterality: N/A;   SCAR REVISION N/A 09/01/2014   Procedure: EXCISION OF CICATRIX;  Surgeon: Norleen Edsel GAILS, MD;  Location: [facility];  Service: Gynecology;  Laterality: N/A;   SHOULDER SURGERY Right    rotator cuff   SPINE SURGERY     Patient Active Problem List   Diagnosis Date Noted   Injury of head 03/15/2023   Respiratory infection 10/17/2022   Palpitations 10/03/2022   URI (upper respiratory infection) 08/18/2022   PSVT (paroxysmal supraventricular tachycardia) 05/18/2022   Morbid obesity (HCC) 05/18/2022   Cocaine abuse (HCC) 05/18/2022   High risk sexual behavior 05/18/2022   Asthma 05/18/2022  Plantar wart 02/01/2021   Tobacco abuse disorder 02/01/2021   Positive ANA (antinuclear antibody) 01/28/2021   Arthritis 01/28/2021   Rash 01/28/2021   Fibromyalgia syndrome 01/28/2021   Bipolar disorder in partial remission 11/14/2016   Bilateral carpal tunnel syndrome 02/17/2016   Allergic rhinitis due to pollen 02/17/2016   Headache 11/09/2015   Back pain with history of spinal surgery 11/09/2015   Diarrhea 05/19/2015   Cysts of both ovaries 05/12/2015   Abdominal wall hernia 02/03/2015   Abdominal wall mass of periumbilical region 02/03/2015   Schatzki's ring    Diverticulosis of colon without hemorrhage    Other hemorrhoids    Rectal bleeding  12/11/2014   GERD (gastroesophageal reflux disease) 12/11/2014   Esophageal dysphagia 12/11/2014   Constipation 12/11/2014   S/P hysterectomy 09/01/2014   Pelvic pain in female 07/22/2014   Anovulation 06/18/2014   DUB (dysfunctional uterine bleeding) 06/18/2014   Menorrhagia with irregular cycle 06/03/2014   Enlarged uterus 06/03/2014   HTN (hypertension) 01/15/2014   Human immunodeficiency virus (HIV) disease (HCC) 07/17/2013   Schizophrenia (HCC) 07/17/2013   Bipolar disorder, unspecified (HCC) 07/17/2013    PCP: Zarwolo, Gloria, FNP REFERRING PROVIDER: Margrette Bars, MD  ONSET DATE: 05/2024  REFERRING DIAG: R Shoulder Pain  THERAPY DIAG:  Right shoulder pain, unspecified chronicity  Shoulder stiffness, right  Other symptoms and signs involving the musculoskeletal system  Rationale for Evaluation and Treatment: Rehabilitation  SUBJECTIVE:   SUBJECTIVE STATEMENT: S: It hurts so bad   PERTINENT HISTORY: 53 year old female status post bilateral rotator cuff surgery arthroscopically for rotator cuff tears over 10 years ago secondary to an injury.  PRECAUTIONS: None  WEIGHT BEARING RESTRICTIONS: No  PAIN:  Are you having pain? Yes: NPRS scale: 7/10 Pain location: trapezius Pain description: throbbing and burning Aggravating factors: anything Relieving factors: nothing  FALLS: Has patient fallen in last 6 months? Yes. Number of falls 4  PLOF: Independent  PATIENT GOALS: To decrease pain  NEXT MD VISIT: 09/11/24  OBJECTIVE:   HAND DOMINANCE: Right  ADLs: Overall ADLs: Pt having difficulty with dressing and unable to wash hair without severe pain. She is limited with grooming due to pain and stiffness. Pt unable to cook or clean at this time due to limited ability for lifting due to weakness and pain.  FUNCTIONAL OUTCOME MEASURES: Upper Extremity Functional Scale (UEFS): 32/80   Extreme difficulty/unable (0), Quite a bit of difficulty (1), Moderate  difficulty (2), Little difficulty (3), No difficulty (4) Survey date:  08/07/24  Any of your usual work, household or school activities 1  2. Your usual hobbies, recreational/sport activities 2   3. Lifting a bag of groceries to waist level 2   4. Lifting a bag of groceries above your head 1  5. Grooming your hair 1  6. Pushing up on your hands (I.e. from bathtub or chair) 1  7. Preparing food (I.e. peeling/cutting) 2  8. Driving  2  9. Vacuuming, sweeping, or raking 2  10. Dressing  1  11. Doing up buttons 3  12. Using tools/appliances 2  13. Opening doors 2  14. Cleaning  2  15. Tying or lacing shoes 3  16. Sleeping  0  17. Laundering clothes (I.e. washing, ironing, folding) 2  18. Opening a jar 1  19. Throwing a ball 1  20. Carrying a small suitcase with your affected limb.  1  Score total:  32/80     UPPER EXTREMITY ROM:  Assessed in seated, er/IR adducted  Active ROM Right eval  Shoulder flexion 146  Shoulder abduction 124  Shoulder internal rotation 90  Shoulder external rotation 20  (Blank rows = not tested)    UPPER EXTREMITY MMT:     Assessed in seated, er/IR adducted  MMT Right eval  Shoulder flexion 4/5  Shoulder abduction 4-/5  Shoulder internal rotation 4-/5  Shoulder external rotation 3+/5  (Blank rows = not tested)  SENSATION: Tingling noted from elbow down to hands  EDEMA: No swelling noted  OBSERVATIONS: Moderate fascial restrictions noted along the bicep, trapezius, and cervical region   TODAY'S TREATMENT:                                                                                                                              DATE:   09/01/24 -manual therapy: myofascial release and trigger point applied to biceps, deltoid, pectoralis, trapezius, and scapular region in order to reduce fascial restrictions and pain, as well as improve ROM.  -A/ROM: sitting- flexion, abduction, protraction, horizontal abduction, er/IR, x10 -X to V  arms, x10 -Scapular ROM: extension, rows, elevation, x10 -Scapular Strengthening: red band, extension, retraction, rows, x10  08/28/24 -manual therapy: myofascial release and trigger point applied to biceps, deltoid, pectoralis, trapezius, and scapular region in order to reduce fascial restrictions and pain, as well as improve ROM.  -A/ROM: supine, flexion, abduction, protraction, horizontal abduction, er/IR, 10 reps -A/ROM: sitting- flexion, abduction, protraction, horizontal abduction, er/IR, 10 reps -Wall wash: 1'  -Pulleys: 1' flexion, 1' abduction  08/15/24 -manual therapy: myofascial release and trigger point applied to biceps, deltoid, pectoralis, trapezius, and scapular region in order to reduce fascial restrictions and pain, as well as improve ROM.  -P/ROM: supine, flexion, abduction, er/IR, x10 -A/ROM: supine, flexion, abduction, protraction, horizontal abduction, er/IR, x10 -Isometrics: flexion, abduction, extension, IR, 4x15 -Proximal Shoulder Exercises: paddles, criss cross, circles both directions, x10 each  08/07/24 AA/ROM: seated, flexion, abduction, protraction, horizontal abduction, er/IR, x10 Cervical ROM:  flexion, extension, lateral rotations, head tilts, x10    PATIENT EDUCATION: Education details: continue HEP Person educated: Patient Education method: Explanation, Demonstration, and Handouts Education comprehension: verbalized understanding and returned demonstration  HOME EXERCISE PROGRAM: 9/4: AA/ROM, Cervical ROM 9/12: Isometrics 9/25: A/ROM  GOALS: Goals reviewed with patient? Yes   SHORT TERM GOALS: Target date: 09/12/24  Pt will be provided with and educated on HEP to improve mobility in RUE required for use during ADL completion.   Goal status: IN PROGRESS  LONG TERM GOALS: Target date: 09/12/24  Pt will decrease pain in RUE to 3/10 or less to improve ability to sleep for 2+ consecutive hours without waking due to pain.   Goal status: IN  PROGRESS  2.  Pt will decrease RUE fascial restrictions to min amounts or less to improve mobility required for functional reaching tasks.   Goal status: IN PROGRESS  3.  Pt will increase RUE A/ROM by 20 degrees to  improve ability to use RUE when reaching overhead or behind back during dressing and bathing tasks.   Goal status: IN PROGRESS  4.  Pt will increase RUE strength to 5/5 or greater to improve ability to use RUE when lifting or carrying items during meal preparation/housework/yardwork tasks.   Goal status: IN PROGRESS  5.  Pt will return to highest level of function using RUE as dominant during functional task completion.   Goal status: IN PROGRESS   ASSESSMENT:  CLINICAL IMPRESSION: Pt continuing to present with high pain and moderate to severe fascial restrictions, which OT addressed with manual therapy then gentle ROM. She continues to have near full ROM, despite pain and restrictions. OT also started pt on scapular strengthening to promote use of her shoulder blades and work on positioning of her shoulders during movements and reduce trapezius elevation. OT providing verbal and tactile cuing for positioning and technique.     PERFORMANCE DEFICITS: in functional skills including in functional skills including ADLs, IADLs, coordination, tone, ROM, strength, pain, fascial restrictions, muscle spasms, and UE functional use.   PLAN:  OT FREQUENCY: 2x/week  OT DURATION: 4 weeks  PLANNED INTERVENTIONS: 97168 OT Re-evaluation, 97535 self care/ADL training, 02889 therapeutic exercise, 97530 therapeutic activity, 97112 neuromuscular re-education, 97140 manual therapy, 97035 ultrasound, 97010 moist heat, 97032 electrical stimulation (manual), passive range of motion, functional mobility training, energy conservation, coping strategies training, patient/family education, and DME and/or AE instructions   CONSULTED AND AGREED WITH PLAN OF CARE: Patient  PLAN FOR NEXT SESSION:  Manual Therapy, P/ROM, AA/ROM to A/ROM,  Proximal Shoulder Exercises    Valentin Nightingale, OTR/L [phone] 09/01/2024, 1:31 PM

## 2024-09-01 NOTE — Telephone Encounter (Signed)
 No additional recs.

## 2024-09-04 ENCOUNTER — Encounter (HOSPITAL_COMMUNITY): Payer: Self-pay | Admitting: *Deleted

## 2024-09-04 ENCOUNTER — Ambulatory Visit (HOSPITAL_COMMUNITY): Attending: Orthopedic Surgery | Admitting: Occupational Therapy

## 2024-09-04 ENCOUNTER — Encounter (HOSPITAL_COMMUNITY): Payer: Self-pay | Admitting: Occupational Therapy

## 2024-09-04 DIAGNOSIS — M25611 Stiffness of right shoulder, not elsewhere classified: Secondary | ICD-10-CM | POA: Diagnosis present

## 2024-09-04 DIAGNOSIS — R29898 Other symptoms and signs involving the musculoskeletal system: Secondary | ICD-10-CM | POA: Insufficient documentation

## 2024-09-04 DIAGNOSIS — M25511 Pain in right shoulder: Secondary | ICD-10-CM | POA: Insufficient documentation

## 2024-09-04 NOTE — Therapy (Signed)
 OUTPATIENT OCCUPATIONAL THERAPY ORTHO TREATMENT NOTE  Patient Name: Rachel Underwood MRN: [id number] DOB:November 04, 1971, 53 y.o., female Today's Date: 09/04/2024   END OF SESSION:  OT End of Session - 09/04/24 1043     Visit Number 5    Number of Visits 8    Date for Recertification  09/12/24    Authorization Type [place] Medicaid    Authorization Time Period 3 visits approved 9/25-10/4; requesting additional visits    Authorization - Visit Number 3    Authorization - Number of Visits 3    OT Start Time 1002    OT Stop Time 1041    OT Time Calculation (min) 39 min    Activity Tolerance Patient tolerated treatment well    Behavior During Therapy WFL for tasks assessed/performed           Past Medical History:  Diagnosis Date   Abnormal Pap smear of cervix 2005(EST)   ckc for abnl pap- [facility] [place]   Allergy    Asthma    Bipolar 1 disorder (HCC)    Borderline personality disorder (HCC)    BPD (bronchopulmonary dysplasia) (HCC)    Carpal tunnel syndrome    bilateral wrist   Depression    Dysmenorrhea 06/03/2014   Enlarged uterus 06/03/2014   Fibromyalgia    GERD (gastroesophageal reflux disease)    Herpes simplex without mention of complication    HIV (human immunodeficiency virus infection) (HCC)    Hyperlipidemia    Hypertension    Menorrhagia with irregular cycle 06/03/2014   Osteoarthritis    Seizures (HCC)    had as a teenager, unknown etiology and no meds.   Past Surgical History:  Procedure Laterality Date   ABDOMINAL HYSTERECTOMY N/A 09/01/2014   Procedure: HYSTERECTOMY ABDOMINAL;  Surgeon: Norleen Edsel GAILS, MD;  Location: [facility];  Service: Gynecology;  Laterality: N/A;   ANKLE SURGERY Right    repair of ligaments   BACK SURGERY     BILATERAL SALPINGECTOMY Bilateral 09/01/2014   Procedure: BILATERAL SALPINGECTOMY;  Surgeon: Norleen Edsel GAILS, MD;  Location: [facility];  Service: Gynecology;  Laterality: Bilateral;   CESAREAN SECTION     ckc     COLONOSCOPY N/A  12/23/2014   MFM:jwjo canal hemorrhoids left-sided diverticula   ESOPHAGOGASTRODUODENOSCOPY N/A 12/23/2014   MFM:zmndpcz reflux/noncritical shatzkis ring s/p dilation/small HH   EXCISION OF SKIN TAG N/A 09/01/2014   Procedure: EXCISION OF SKIN NEVUS;  Surgeon: Norleen Edsel GAILS, MD;  Location: [facility];  Service: Gynecology;  Laterality: N/A;   FRACTURE SURGERY     MALONEY DILATION N/A 12/23/2014   Procedure: AGAPITO DILATION;  Surgeon: Lamar CHRISTELLA Hollingshead, MD;  [facility];  Service: Endoscopy;  Laterality: N/A;   SAVORY DILATION N/A 12/23/2014   Procedure: SAVORY DILATION;  Surgeon: Lamar CHRISTELLA Hollingshead, MD;  [facility];  Service: Endoscopy;  Laterality: N/A;   SCAR REVISION N/A 09/01/2014   Procedure: EXCISION OF CICATRIX;  Surgeon: Norleen Edsel GAILS, MD;  Location: [facility];  Service: Gynecology;  Laterality: N/A;   SHOULDER SURGERY Right    rotator cuff   SPINE SURGERY     Patient Active Problem List   Diagnosis Date Noted   Injury of head 03/15/2023   Respiratory infection 10/17/2022   Palpitations 10/03/2022   URI (upper respiratory infection) 08/18/2022   PSVT (paroxysmal supraventricular tachycardia) 05/18/2022   Morbid obesity (HCC) 05/18/2022   Cocaine abuse (HCC) 05/18/2022   High risk sexual behavior 05/18/2022  Asthma 05/18/2022   Plantar wart 02/01/2021   Tobacco abuse disorder 02/01/2021   Positive ANA (antinuclear antibody) 01/28/2021   Arthritis 01/28/2021   Rash 01/28/2021   Fibromyalgia syndrome 01/28/2021   Bipolar disorder in partial remission 11/14/2016   Bilateral carpal tunnel syndrome 02/17/2016   Allergic rhinitis due to pollen 02/17/2016   Headache 11/09/2015   Back pain with history of spinal surgery 11/09/2015   Diarrhea 05/19/2015   Cysts of both ovaries 05/12/2015   Abdominal wall hernia 02/03/2015   Abdominal wall mass of periumbilical region 02/03/2015   Schatzki's ring    Diverticulosis of colon without hemorrhage    Other hemorrhoids     Rectal bleeding 12/11/2014   GERD (gastroesophageal reflux disease) 12/11/2014   Esophageal dysphagia 12/11/2014   Constipation 12/11/2014   S/P hysterectomy 09/01/2014   Pelvic pain in female 07/22/2014   Anovulation 06/18/2014   DUB (dysfunctional uterine bleeding) 06/18/2014   Menorrhagia with irregular cycle 06/03/2014   Enlarged uterus 06/03/2014   HTN (hypertension) 01/15/2014   Human immunodeficiency virus (HIV) disease (HCC) 07/17/2013   Schizophrenia (HCC) 07/17/2013   Bipolar disorder, unspecified (HCC) 07/17/2013    PCP: Zarwolo, Gloria, FNP REFERRING PROVIDER: Margrette Bars, MD  ONSET DATE: 05/2024  REFERRING DIAG: R Shoulder Pain  THERAPY DIAG:  Right shoulder pain, unspecified chronicity  Shoulder stiffness, right  Other symptoms and signs involving the musculoskeletal system  Rationale for Evaluation and Treatment: Rehabilitation  SUBJECTIVE:   SUBJECTIVE STATEMENT: S: It's pretty sore today  PERTINENT HISTORY: 53 year old female status post bilateral rotator cuff surgery arthroscopically for rotator cuff tears over 10 years ago secondary to an injury.  PRECAUTIONS: None  WEIGHT BEARING RESTRICTIONS: No  PAIN:  Are you having pain? Yes: NPRS scale: 8/10 Pain location: trapezius Pain description: sore Aggravating factors: anything Relieving factors: nothing  FALLS: Has patient fallen in last 6 months? Yes. Number of falls 4  PLOF: Independent  PATIENT GOALS: To decrease pain  NEXT MD VISIT: 09/11/24  OBJECTIVE:   HAND DOMINANCE: Right  ADLs: Overall ADLs: Pt having difficulty with dressing and unable to wash hair without severe pain. She is limited with grooming due to pain and stiffness. Pt unable to cook or clean at this time due to limited ability for lifting due to weakness and pain.  FUNCTIONAL OUTCOME MEASURES: Upper Extremity Functional Scale (UEFS): 32/80   Extreme difficulty/unable (0), Quite a bit of difficulty (1),  Moderate difficulty (2), Little difficulty (3), No difficulty (4) Survey date:  08/07/24  Any of your usual work, household or school activities 1  2. Your usual hobbies, recreational/sport activities 2   3. Lifting a bag of groceries to waist level 2   4. Lifting a bag of groceries above your head 1  5. Grooming your hair 1  6. Pushing up on your hands (I.e. from bathtub or chair) 1  7. Preparing food (I.e. peeling/cutting) 2  8. Driving  2  9. Vacuuming, sweeping, or raking 2  10. Dressing  1  11. Doing up buttons 3  12. Using tools/appliances 2  13. Opening doors 2  14. Cleaning  2  15. Tying or lacing shoes 3  16. Sleeping  0  17. Laundering clothes (I.e. washing, ironing, folding) 2  18. Opening a jar 1  19. Throwing a ball 1  20. Carrying a small suitcase with your affected limb.  1  Score total:  32/80     UPPER EXTREMITY ROM:  Assessed in seated, er/IR adducted  Active ROM Right eval  Shoulder flexion 146  Shoulder abduction 124  Shoulder internal rotation 90  Shoulder external rotation 20  (Blank rows = not tested)    UPPER EXTREMITY MMT:     Assessed in seated, er/IR adducted  MMT Right eval  Shoulder flexion 4/5  Shoulder abduction 4-/5  Shoulder internal rotation 4-/5  Shoulder external rotation 3+/5  (Blank rows = not tested)  SENSATION: Tingling noted from elbow down to hands  EDEMA: No swelling noted  OBSERVATIONS: Moderate fascial restrictions noted along the bicep, trapezius, and cervical region   TODAY'S TREATMENT:                                                                                                                              DATE:  09/04/24 -manual therapy: myofascial release and trigger point applied to biceps, deltoid, pectoralis, trapezius, and scapular region in order to reduce fascial restrictions and pain, as well as improve ROM.  -A/ROM: supine, flexion, protraction, horizontal abduction, er/IR, 10 reps -Scapular  ROM: extension, rows, elevation, 10 reps -Prot/ret/elev/dep on doorway, 1' -ES: 16.5 CV, 10' with moist heat applied  09/01/24 -manual therapy: myofascial release and trigger point applied to biceps, deltoid, pectoralis, trapezius, and scapular region in order to reduce fascial restrictions and pain, as well as improve ROM.  -A/ROM: sitting- flexion, abduction, protraction, horizontal abduction, er/IR, x10 -X to V arms, x10 -Scapular ROM: extension, rows, elevation, x10 -Scapular Strengthening: red band, extension, retraction, rows, x10  08/28/24 -manual therapy: myofascial release and trigger point applied to biceps, deltoid, pectoralis, trapezius, and scapular region in order to reduce fascial restrictions and pain, as well as improve ROM.  -A/ROM: supine, flexion, abduction, protraction, horizontal abduction, er/IR, 10 reps -A/ROM: sitting- flexion, abduction, protraction, horizontal abduction, er/IR, 10 reps -Wall wash: 1'  -Pulleys: 1' flexion, 1' abduction    PATIENT EDUCATION: Education details: continue HEP Person educated: Patient Education method: Explanation, Demonstration, and Handouts Education comprehension: verbalized understanding and returned demonstration  HOME EXERCISE PROGRAM: 9/4: AA/ROM, Cervical ROM 9/12: Isometrics 9/25: A/ROM  GOALS: Goals reviewed with patient? Yes   SHORT TERM GOALS: Target date: 09/12/24  Pt will be provided with and educated on HEP to improve mobility in RUE required for use during ADL completion.   Goal status: IN PROGRESS  LONG TERM GOALS: Target date: 09/12/24  Pt will decrease pain in RUE to 3/10 or less to improve ability to sleep for 2+ consecutive hours without waking due to pain.   Goal status: IN PROGRESS  2.  Pt will decrease RUE fascial restrictions to min amounts or less to improve mobility required for functional reaching tasks.   Goal status: IN PROGRESS  3.  Pt will increase RUE A/ROM by 20 degrees to  improve ability to use RUE when reaching overhead or behind back during dressing and bathing tasks.   Goal status: IN PROGRESS  4.  Pt  will increase RUE strength to 5/5 or greater to improve ability to use RUE when lifting or carrying items during meal preparation/housework/yardwork tasks.   Goal status: IN PROGRESS  5.  Pt will return to highest level of function using RUE as dominant during functional task completion.   Goal status: IN PROGRESS   ASSESSMENT:  CLINICAL IMPRESSION: Pt continuing to present with high pain and moderate to severe fascial restrictions, which OT addressed with manual therapy including trigger point release to trapezius. Pt unable to tolerate abduction A/ROM today due to pain. Added scapular mobility on door. OT providing verbal and tactile cuing for positioning and technique. Pt agreeable to trial ES for pain as this modality has assisted her in the past. Pt reports pain of 6/10 at end of session after ES.     PERFORMANCE DEFICITS: in functional skills including in functional skills including ADLs, IADLs, coordination, tone, ROM, strength, pain, fascial restrictions, muscle spasms, and UE functional use.   PLAN:  OT FREQUENCY: 2x/week  OT DURATION: 4 weeks  PLANNED INTERVENTIONS: 97168 OT Re-evaluation, 97535 self care/ADL training, 02889 therapeutic exercise, 97530 therapeutic activity, 97112 neuromuscular re-education, 97140 manual therapy, 97035 ultrasound, 97010 moist heat, 97032 electrical stimulation (manual), passive range of motion, functional mobility training, energy conservation, coping strategies training, patient/family education, and DME and/or AE instructions   CONSULTED AND AGREED WITH PLAN OF CARE: Patient  PLAN FOR NEXT SESSION: Manual Therapy, P/ROM, AA/ROM to A/ROM,  Proximal Shoulder Exercises    Sonny Cory, OTR/L  [phone] 09/04/2024, 10:44 AM

## 2024-09-07 ENCOUNTER — Other Ambulatory Visit (HOSPITAL_COMMUNITY): Payer: Self-pay

## 2024-09-08 ENCOUNTER — Encounter: Payer: Self-pay | Admitting: Obstetrics & Gynecology

## 2024-09-08 ENCOUNTER — Telehealth: Payer: Self-pay

## 2024-09-08 ENCOUNTER — Other Ambulatory Visit: Payer: Self-pay

## 2024-09-08 ENCOUNTER — Other Ambulatory Visit (HOSPITAL_COMMUNITY): Payer: Self-pay

## 2024-09-08 ENCOUNTER — Ambulatory Visit: Admitting: Obstetrics & Gynecology

## 2024-09-08 VITALS — BP 114/71 | Ht 65.0 in | Wt 250.4 lb

## 2024-09-08 DIAGNOSIS — R350 Frequency of micturition: Secondary | ICD-10-CM

## 2024-09-08 DIAGNOSIS — N3281 Overactive bladder: Secondary | ICD-10-CM | POA: Diagnosis not present

## 2024-09-08 DIAGNOSIS — R102 Pelvic and perineal pain unspecified side: Secondary | ICD-10-CM

## 2024-09-08 LAB — POCT URINALYSIS DIPSTICK OB
Blood, UA: NEGATIVE
Glucose, UA: NEGATIVE
Ketones, UA: NEGATIVE
Leukocytes, UA: NEGATIVE
Nitrite, UA: NEGATIVE
POC,PROTEIN,UA: NEGATIVE

## 2024-09-08 MED ORDER — MIRABEGRON ER 25 MG PO TB24
25.0000 mg | ORAL_TABLET | Freq: Every day | ORAL | 11 refills | Status: AC
  Filled 2024-09-08: qty 30, 30d supply, fill #0
  Filled 2024-10-11: qty 30, 30d supply, fill #1
  Filled 2024-11-14: qty 30, 30d supply, fill #2
  Filled 2024-12-13: qty 30, 30d supply, fill #3

## 2024-09-08 NOTE — Telephone Encounter (Signed)
 Pharmacy Patient Advocate Encounter   Received notification from Patient Pharmacy that prior authorization for [facility]  is required/requested.   Insurance verification completed.   The patient is insured through [facility] MEDICAID.   Per test claim: PA required; However, NEW/RECENT labs/notes are needed to complete & submit PA request. Please see below.  Faxed key to provider for them to complete PA. [id number]

## 2024-09-08 NOTE — Progress Notes (Signed)
 GYN VISIT Patient name: Rachel Underwood MRN [id number]  Date of birth: Dec 12, 1970 Chief Complaint:   Urinary Frequency and Pelvic Pain  History of Present Illness:   Rachel Underwood is a 53 y.o. [phone] female being seen today for the following concerns:     Urinary frequency and pelvic pain- this has been ongoing for the past 6 mos.  Symptoms are intermittent.  On occasion may note discomfort when voiding.  Denies hematuria. Notes some stress incontinence.  Denies urgency.  On occasion may note urge incontinence.  +nocturia  About 6 cups of coffee in the morning  Notes constant pain- lower abdomen.  Feels better when she is lying down.  Notes nausea from her medication.  Denies decreased appetite.  Feels like she stays bloated.  Notes that she stays constipated- maybe once a week.  Also notes severe GERD and has plan to schedule appoint with GI  Per pt mammogram completed in the [facility] May [place] ([facility])  Patient's last menstrual period was 08/11/2014.    Review of Systems:   Pertinent items are noted in HPI Denies fever/chills, dizziness, headaches, visual disturbances, fatigue, shortness of breath, chest pain.  Pertinent History Reviewed:   Past Surgical History:  Procedure Laterality Date   ABDOMINAL HYSTERECTOMY N/A 09/01/2014   Procedure: HYSTERECTOMY ABDOMINAL;  Surgeon: Norleen Edsel GAILS, MD;  Location: [facility];  Service: Gynecology;  Laterality: N/A;   ANKLE SURGERY Right    repair of ligaments   BACK SURGERY     BILATERAL SALPINGECTOMY Bilateral 09/01/2014   Procedure: BILATERAL SALPINGECTOMY;  Surgeon: Norleen Edsel GAILS, MD;  Location: [facility];  Service: Gynecology;  Laterality: Bilateral;   CESAREAN SECTION     ckc     COLONOSCOPY N/A 12/23/2014   MFM:jwjo canal hemorrhoids left-sided diverticula   ESOPHAGOGASTRODUODENOSCOPY N/A 12/23/2014   MFM:zmndpcz reflux/noncritical shatzkis ring s/p dilation/small HH   EXCISION OF SKIN TAG N/A 09/01/2014   Procedure: EXCISION OF  SKIN NEVUS;  Surgeon: Norleen Edsel GAILS, MD;  Location: [facility];  Service: Gynecology;  Laterality: N/A;   FRACTURE SURGERY     MALONEY DILATION N/A 12/23/2014   Procedure: AGAPITO DILATION;  Surgeon: Lamar CHRISTELLA Hollingshead, MD;  [facility];  Service: Endoscopy;  Laterality: N/A;   SAVORY DILATION N/A 12/23/2014   Procedure: SAVORY DILATION;  Surgeon: Lamar CHRISTELLA Hollingshead, MD;  [facility];  Service: Endoscopy;  Laterality: N/A;   SCAR REVISION N/A 09/01/2014   Procedure: EXCISION OF CICATRIX;  Surgeon: Norleen Edsel GAILS, MD;  Location: [facility];  Service: Gynecology;  Laterality: N/A;   SHOULDER SURGERY Right    rotator cuff   SPINE SURGERY      Past Medical History:  Diagnosis Date   Abnormal Pap smear of cervix 2005(EST)   ckc for abnl pap- [facility] [place]   Allergy    Asthma    Bipolar 1 disorder (HCC)    Borderline personality disorder (HCC)    BPD (bronchopulmonary dysplasia) (HCC)    Carpal tunnel syndrome    bilateral wrist   Depression    Dysmenorrhea 06/03/2014   Enlarged uterus 06/03/2014   Fibromyalgia    GERD (gastroesophageal reflux disease)    Herpes simplex without mention of complication    HIV (human immunodeficiency virus infection) (HCC)    Hyperlipidemia    Hypertension    Menorrhagia with irregular cycle 06/03/2014   Osteoarthritis    Seizures (HCC)    had as a teenager,  unknown etiology and no meds.   Reviewed problem list, medications and allergies. Physical Assessment:   Vitals:   09/08/24 1327  BP: 114/71  Weight: 250 lb 6.4 oz (113.6 kg)  Height: 5' 5 (1.651 m)  Body mass index is 41.67 kg/m.       Physical Examination:   General appearance: alert, well appearing, and in no distress  Psych: mood appropriate, normal affect  Skin: warm & dry   Cardiovascular: normal heart rate noted  Respiratory: normal respiratory effort, no distress  Abdomen: obese, soft, no rebound or guarding, diffuse tenderness with palpation lower  abdomen.  Rates her pain 3/10  Pelvic: VULVA: normal appearing vulva with no masses, tenderness or lesions, VAGINA: normal appearing vagina with normal color and discharge, no lesions.  Uterus surgically absent.  Stage 1 cystocele/rectocele.  Good apical support.  Diffuse tenderness on bimanual exam- no discrete mass palpated- exam limited due to body habitus  Extremities: no edema   Chaperone: Aleck Blase    Assessment & Plan:  1) Pelvic pain -plan for pelvic US  to r/o underlying etiology - Suspect that some of her pain may be multifactorial as patient notes GI issues as well  2) OAB and possible concern for painful bladder syndrome - PUFF questionnaire completed score of 13 -plan to r/o underlying infection -Based on exam and clinical history concern for OAB - Encourage patient to cut back on fluid intake prior to bedtime as well as cutting back on caffeine.  Reviewed with patient that she could make a positive influence on her symptoms with some lifestyle modifications - Will also plan to start Myrbetriq, follow-up in 3 to 4 months  Meds ordered this encounter  Medications   mirabegron ER (MYRBETRIQ) 25 MG TB24 tablet    Sig: Take 1 tablet (25 mg total) by mouth daily.    Dispense:  30 tablet    Refill:  11   Orders Placed This Encounter  Procedures   Urine Culture   US  PELVIC COMPLETE WITH TRANSVAGINAL    Standing Status:   Future    Expiration Date:   09/08/2025    Reason for Exam (SYMPTOM  OR DIAGNOSIS REQUIRED):   pelvic pain    Preferred imaging location?:   [facility]   POC Urinalysis Dipstick OB     Orders Placed This Encounter  Procedures   Urine Culture   US  PELVIC COMPLETE WITH TRANSVAGINAL   POC Urinalysis Dipstick OB    Return for pelvic pain and follow up in 6 mos (med follow up)6.   Vuk Skillern, DO Attending Obstetrician & Gynecologist, [facility] for [employer], [facility]

## 2024-09-09 ENCOUNTER — Encounter (HOSPITAL_COMMUNITY): Payer: Self-pay | Admitting: Occupational Therapy

## 2024-09-09 ENCOUNTER — Telehealth: Payer: Self-pay | Admitting: Family Medicine

## 2024-09-09 ENCOUNTER — Ambulatory Visit (HOSPITAL_COMMUNITY): Admitting: Occupational Therapy

## 2024-09-09 ENCOUNTER — Telehealth: Payer: Self-pay

## 2024-09-09 ENCOUNTER — Other Ambulatory Visit: Payer: Self-pay

## 2024-09-09 DIAGNOSIS — R29898 Other symptoms and signs involving the musculoskeletal system: Secondary | ICD-10-CM

## 2024-09-09 DIAGNOSIS — M25511 Pain in right shoulder: Secondary | ICD-10-CM | POA: Diagnosis not present

## 2024-09-09 DIAGNOSIS — R350 Frequency of micturition: Secondary | ICD-10-CM

## 2024-09-09 DIAGNOSIS — M25611 Stiffness of right shoulder, not elsewhere classified: Secondary | ICD-10-CM

## 2024-09-09 LAB — SPECIMEN STATUS REPORT

## 2024-09-09 NOTE — Telephone Encounter (Signed)
 PCS forms  Noted Copied Sleeved Original placed front desk folder Copy placed front desk folder in copy  Patient has appt 02.03.2025 at 9:00 am and has been added to a waiting list

## 2024-09-09 NOTE — Therapy (Signed)
 OUTPATIENT OCCUPATIONAL THERAPY ORTHO TREATMENT NOTE  Patient Name: Rachel Underwood MRN: [id number] DOB:13-Nov-1971, 53 y.o., female Today's Date: 09/09/2024   END OF SESSION:  OT End of Session - 09/09/24 1205     Visit Number 6    Number of Visits 8    Date for Recertification  09/12/24    Authorization Type [place] Medicaid    Authorization Time Period 3 visits approved 9/25-10/4; 4 visits approved 10/7-10/20    Authorization - Visit Number 1    Authorization - Number of Visits 4    OT Start Time 1149    OT Stop Time 1215    OT Time Calculation (min) 26 min    Activity Tolerance Patient tolerated treatment well    Behavior During Therapy WFL for tasks assessed/performed            Past Medical History:  Diagnosis Date   Abnormal Pap smear of cervix 2005(EST)   ckc for abnl pap- [facility] [place]   Allergy    Asthma    Bipolar 1 disorder (HCC)    Borderline personality disorder (HCC)    BPD (bronchopulmonary dysplasia) (HCC)    Carpal tunnel syndrome    bilateral wrist   Depression    Dysmenorrhea 06/03/2014   Enlarged uterus 06/03/2014   Fibromyalgia    GERD (gastroesophageal reflux disease)    Herpes simplex without mention of complication    HIV (human immunodeficiency virus infection) (HCC)    Hyperlipidemia    Hypertension    Menorrhagia with irregular cycle 06/03/2014   Osteoarthritis    Seizures (HCC)    had as a teenager, unknown etiology and no meds.   Past Surgical History:  Procedure Laterality Date   ABDOMINAL HYSTERECTOMY N/A 09/01/2014   Procedure: HYSTERECTOMY ABDOMINAL;  Surgeon: Norleen Edsel GAILS, MD;  [facility]: [facility];  Service: Gynecology;  Laterality: N/A;   ANKLE SURGERY Right    repair of ligaments   BACK SURGERY     BILATERAL SALPINGECTOMY Bilateral 09/01/2014   Procedure: BILATERAL SALPINGECTOMY;  Surgeon: Norleen Edsel GAILS, MD;  Location: [facility];  Service: Gynecology;  Laterality: Bilateral;   CESAREAN SECTION     ckc     COLONOSCOPY  N/A 12/23/2014   MFM:jwjo canal hemorrhoids left-sided diverticula   ESOPHAGOGASTRODUODENOSCOPY N/A 12/23/2014   MFM:zmndpcz reflux/noncritical shatzkis ring s/p dilation/small HH   EXCISION OF SKIN TAG N/A 09/01/2014   Procedure: EXCISION OF SKIN NEVUS;  Surgeon: Norleen Edsel GAILS, MD;  Location: [facility];  Service: Gynecology;  Laterality: N/A;   FRACTURE SURGERY     MALONEY DILATION N/A 12/23/2014   Procedure: AGAPITO DILATION;  Surgeon: Lamar CHRISTELLA Hollingshead, MD;  [facility];  Service: Endoscopy;  Laterality: N/A;   SAVORY DILATION N/A 12/23/2014   Procedure: SAVORY DILATION;  Surgeon: Lamar CHRISTELLA Hollingshead, MD;  [facility];  Service: Endoscopy;  Laterality: N/A;   SCAR REVISION N/A 09/01/2014   Procedure: EXCISION OF CICATRIX;  Surgeon: Norleen Edsel GAILS, MD;  Location: [facility];  Service: Gynecology;  Laterality: N/A;   SHOULDER SURGERY Right    rotator cuff   SPINE SURGERY     Patient Active Problem List   Diagnosis Date Noted   Injury of head 03/15/2023   Respiratory infection 10/17/2022   Palpitations 10/03/2022   URI (upper respiratory infection) 08/18/2022   PSVT (paroxysmal supraventricular tachycardia) 05/18/2022   Morbid obesity (HCC) 05/18/2022   Cocaine abuse (HCC) 05/18/2022   High risk sexual behavior  05/18/2022   Asthma 05/18/2022   Plantar wart 02/01/2021   Tobacco abuse disorder 02/01/2021   Positive ANA (antinuclear antibody) 01/28/2021   Arthritis 01/28/2021   Rash 01/28/2021   Fibromyalgia syndrome 01/28/2021   Bipolar disorder in partial remission 11/14/2016   Bilateral carpal tunnel syndrome 02/17/2016   Allergic rhinitis due to pollen 02/17/2016   Headache 11/09/2015   Back pain with history of spinal surgery 11/09/2015   Diarrhea 05/19/2015   Cysts of both ovaries 05/12/2015   Abdominal wall hernia 02/03/2015   Abdominal wall mass of periumbilical region 02/03/2015   Schatzki's ring    Diverticulosis of colon without hemorrhage    Other  hemorrhoids    Rectal bleeding 12/11/2014   GERD (gastroesophageal reflux disease) 12/11/2014   Esophageal dysphagia 12/11/2014   Constipation 12/11/2014   S/P hysterectomy 09/01/2014   Pelvic pain in female 07/22/2014   Anovulation 06/18/2014   HTN (hypertension) 01/15/2014   Human immunodeficiency virus (HIV) disease (HCC) 07/17/2013   Schizophrenia (HCC) 07/17/2013   Bipolar disorder, unspecified (HCC) 07/17/2013    PCP: Zarwolo, Gloria, FNP REFERRING PROVIDER: Margrette Bars, MD  ONSET DATE: 05/2024  REFERRING DIAG: R Shoulder Pain  THERAPY DIAG:  Right shoulder pain, unspecified chronicity  Shoulder stiffness, right  Other symptoms and signs involving the musculoskeletal system  Rationale for Evaluation and Treatment: Rehabilitation  SUBJECTIVE:   SUBJECTIVE STATEMENT: S: It's about the same, still painful  PERTINENT HISTORY: 53 year old female status post bilateral rotator cuff surgery arthroscopically for rotator cuff tears over 10 years ago secondary to an injury.  PRECAUTIONS: None  WEIGHT BEARING RESTRICTIONS: No  PAIN:  Are you having pain? Yes: NPRS scale: 8/10 Pain location: trapezius Pain description: sore Aggravating factors: anything Relieving factors: nothing  FALLS: Has patient fallen in last 6 months? Yes. Number of falls 4  PLOF: Independent  PATIENT GOALS: To decrease pain  NEXT MD VISIT: 09/11/24  OBJECTIVE:   HAND DOMINANCE: Right  ADLs: Overall ADLs: Pt having difficulty with dressing and unable to wash hair without severe pain. She is limited with grooming due to pain and stiffness. Pt unable to cook or clean at this time due to limited ability for lifting due to weakness and pain.  FUNCTIONAL OUTCOME MEASURES: Upper Extremity Functional Scale (UEFS): 32/80   Extreme difficulty/unable (0), Quite a bit of difficulty (1), Moderate difficulty (2), Little difficulty (3), No difficulty (4) Survey date:  08/07/24 09/09/24  Any of  your usual work, household or school activities 1 2  2. Your usual hobbies, recreational/sport activities 2 1   3. Lifting a bag of groceries to waist level 2 3   4. Lifting a bag of groceries above your head 1 0  5. Grooming your hair 1 2  6. Pushing up on your hands (I.e. from bathtub or chair) 1 2  7. Preparing food (I.e. peeling/cutting) 2 2  8. Driving  2 2  9. Vacuuming, sweeping, or raking 2 1  10. Dressing  1 2  11. Doing up buttons 3 3  12. Using tools/appliances 2 1  13. Opening doors 2 2  14. Cleaning  2 3  15. Tying or lacing shoes 3 3  16. Sleeping  0 1  17. Laundering clothes (I.e. washing, ironing, folding) 2 2  18. Opening a jar 1 1  19. Throwing a ball 1 0  20. Carrying a small suitcase with your affected limb.  1 2  Score total:  32/80 35/80  UPPER EXTREMITY ROM:       Assessed in seated, er/IR adducted  Active ROM Right eval Right  09/09/24  Shoulder flexion 146 86  Shoulder abduction 124 100  Shoulder internal rotation 90 90  Shoulder external rotation 20 66  (Blank rows = not tested)    UPPER EXTREMITY MMT:     Assessed in seated, er/IR adducted  MMT Right eval Right 09/09/24  Shoulder flexion 4/5 4-/5  Shoulder abduction 4-/5 4-/5  Shoulder internal rotation 4-/5 4/5  Shoulder external rotation 3+/5 5/5  (Blank rows = not tested)  SENSATION: Tingling noted from elbow down to hands  EDEMA: No swelling noted  OBSERVATIONS: Moderate fascial restrictions noted along the bicep, trapezius, and cervical region   TODAY'S TREATMENT:                                                                                                                              DATE:  09/09/24 -manual therapy: myofascial release and trigger point applied to biceps, deltoid, pectoralis, trapezius, and scapular region in order to reduce fascial restrictions and pain, as well as improve ROM.  -A/ROM: supine-flexion, protraction, horizontal abduction, er/IR, 10  reps   09/04/24 -manual therapy: myofascial release and trigger point applied to biceps, deltoid, pectoralis, trapezius, and scapular region in order to reduce fascial restrictions and pain, as well as improve ROM.  -A/ROM: supine, flexion, protraction, horizontal abduction, er/IR, 10 reps -Scapular ROM: extension, rows, elevation, 10 reps -Prot/ret/elev/dep on doorway, 1' -ES: 16.5 CV, 10' with moist heat applied  09/01/24 -manual therapy: myofascial release and trigger point applied to biceps, deltoid, pectoralis, trapezius, and scapular region in order to reduce fascial restrictions and pain, as well as improve ROM.  -A/ROM: sitting- flexion, abduction, protraction, horizontal abduction, er/IR, x10 -X to V arms, x10 -Scapular ROM: extension, rows, elevation, x10 -Scapular Strengthening: red band, extension, retraction, rows, x10   PATIENT EDUCATION: Education details: continue HEP Person educated: Patient Education method: Explanation, Demonstration, and Handouts Education comprehension: verbalized understanding and returned demonstration  HOME EXERCISE PROGRAM: 9/4: AA/ROM, Cervical ROM 9/12: Isometrics 9/25: A/ROM  GOALS: Goals reviewed with patient? Yes   SHORT TERM GOALS: Target date: 09/12/24  Pt will be provided with and educated on HEP to improve mobility in RUE required for use during ADL completion.   Goal status: IN PROGRESS  LONG TERM GOALS: Target date: 09/12/24  Pt will decrease pain in RUE to 3/10 or less to improve ability to sleep for 2+ consecutive hours without waking due to pain.   Goal status: IN PROGRESS  2.  Pt will decrease RUE fascial restrictions to min amounts or less to improve mobility required for functional reaching tasks.   Goal status: IN PROGRESS  3.  Pt will increase RUE A/ROM by 20 degrees to improve ability to use RUE when reaching overhead or behind back during dressing and bathing tasks.   Goal status: IN PROGRESS  4.  Pt  will increase RUE strength to 5/5 or greater to improve ability to use RUE when lifting or carrying items during meal preparation/housework/yardwork tasks.   Goal status: IN PROGRESS  5.  Pt will return to highest level of function using RUE as dominant during functional task completion.   Goal status: IN PROGRESS   ASSESSMENT:  CLINICAL IMPRESSION: Progress note completed today as pt with MD follow up on 09/11/24. Pt continues to have high pain levels that are unchanged. Pt with full available ROM, however pain limited and pt unable to achieve prior level of ROM demonstrated at evaluation. Pt continues to have difficulty with functional reaching and ADLs due to pain in the dominant RUE. Recommend follow up with MD for further medical assessment, will place pt on therapy hold at this time. Pt aware to call office after MD appointment if MD instructs to continue with therapy.     PERFORMANCE DEFICITS: in functional skills including in functional skills including ADLs, IADLs, coordination, tone, ROM, strength, pain, fascial restrictions, muscle spasms, and UE functional use.   PLAN:  OT FREQUENCY: 2x/week  OT DURATION: 4 weeks  PLANNED INTERVENTIONS: 97168 OT Re-evaluation, 97535 self care/ADL training, 02889 therapeutic exercise, 97530 therapeutic activity, 97112 neuromuscular re-education, 97140 manual therapy, 97035 ultrasound, 97010 moist heat, 97032 electrical stimulation (manual), passive range of motion, functional mobility training, energy conservation, coping strategies training, patient/family education, and DME and/or AE instructions   CONSULTED AND AGREED WITH PLAN OF CARE: Patient  PLAN FOR NEXT SESSION: Manual Therapy, P/ROM, AA/ROM to A/ROM,  Proximal Shoulder Exercises    Sonny Cory, OTR/L  [phone] 09/09/2024, 12:26 PM

## 2024-09-09 NOTE — Telephone Encounter (Signed)
 Called patient about mislabeling of urine culture. Patient did not answer LVM to call office back or see MyChart message send as well.

## 2024-09-10 ENCOUNTER — Other Ambulatory Visit: Payer: Self-pay

## 2024-09-10 NOTE — Telephone Encounter (Signed)
 Pls make her aware PCS forms cannot be filled out until she has an appt. They require an office visit within the past 90 days before they will accept the form. We will hold onto it for her and please make a note on the appt desk that her forms are in folder. Thanks

## 2024-09-11 ENCOUNTER — Other Ambulatory Visit: Payer: Self-pay

## 2024-09-11 ENCOUNTER — Ambulatory Visit: Admitting: Orthopedic Surgery

## 2024-09-11 ENCOUNTER — Other Ambulatory Visit (HOSPITAL_COMMUNITY)

## 2024-09-11 DIAGNOSIS — G8929 Other chronic pain: Secondary | ICD-10-CM

## 2024-09-11 DIAGNOSIS — M25511 Pain in right shoulder: Secondary | ICD-10-CM | POA: Diagnosis not present

## 2024-09-11 DIAGNOSIS — Z8739 Personal history of other diseases of the musculoskeletal system and connective tissue: Secondary | ICD-10-CM | POA: Diagnosis not present

## 2024-09-11 DIAGNOSIS — M75101 Unspecified rotator cuff tear or rupture of right shoulder, not specified as traumatic: Secondary | ICD-10-CM

## 2024-09-11 MED ORDER — MELOXICAM 7.5 MG PO TABS
7.5000 mg | ORAL_TABLET | Freq: Every day | ORAL | 5 refills | Status: DC
  Filled 2024-09-11: qty 30, 30d supply, fill #0

## 2024-09-11 NOTE — Patient Instructions (Signed)
 While we are working on your approval for MRI please go ahead and call to schedule your appointment with [facility] within at least one (1) week.   Central Scheduling [phone]

## 2024-09-11 NOTE — Progress Notes (Signed)
 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  She tried ibuprofen  and home exercise as well as formal physical therapy as well with no improvement  Exam shows that she has painful forward elevation she has with mild weakness secondary to pain  Recommend changed to Celebrex and MRI to rule out rotator cuff tear  Encounter Diagnoses  Name Primary?   Chronic right shoulder pain    Rotator cuff syndrome of right shoulder Yes

## 2024-09-11 NOTE — Progress Notes (Signed)
    09/11/2024   Chief Complaint  Patient presents with   Shoulder Pain    R getting worse w/ Therapy    No diagnosis found.  What pharmacy do you use ? [facility]    Worse

## 2024-09-12 ENCOUNTER — Telehealth: Payer: Self-pay

## 2024-09-12 NOTE — Telephone Encounter (Signed)
 Copied from CRM [id number]. Topic: Clinical - Order For Equipment >> Sep 12, 2024  1:21 PM [name] wrote: Reason for CRM: patient calling asking about forms for incontinence briefs, bed pads and gloves supplies Forms were faxed or mailed over from Dr Jennifer Ozan at [facility] for Rachel Underwood Patient is going [facility]

## 2024-09-15 ENCOUNTER — Other Ambulatory Visit: Payer: Self-pay

## 2024-09-15 NOTE — Progress Notes (Signed)
 PA still pending.

## 2024-09-16 ENCOUNTER — Other Ambulatory Visit (HOSPITAL_COMMUNITY): Payer: Self-pay

## 2024-09-16 ENCOUNTER — Other Ambulatory Visit: Payer: Self-pay

## 2024-09-16 NOTE — Progress Notes (Signed)
 Specialty Pharmacy Initial Fill Coordination Note  Rachel Underwood is a 53 y.o. female contacted today regarding initial fill of specialty medication(s) Bictegravir-Emtricitab-Tenofov (Biktarvy )   Patient requested Delivery   Delivery date: 09/18/24   Verified address: [address] [place] [address]   Medication will be filled on 09/17/24.   Patient is aware of $0 copayment.

## 2024-09-17 ENCOUNTER — Other Ambulatory Visit: Payer: Self-pay

## 2024-09-18 ENCOUNTER — Ambulatory Visit (HOSPITAL_COMMUNITY)
Admission: RE | Admit: 2024-09-18 | Discharge: 2024-09-18 | Disposition: A | Discharge status: Home / Self Care | Source: Ambulatory Visit | Attending: Cardiology | Admitting: Cardiology

## 2024-09-18 DIAGNOSIS — R072 Precordial pain: Secondary | ICD-10-CM | POA: Diagnosis present

## 2024-09-18 MED ORDER — PERFLUTREN LIPID MICROSPHERE
1.0000 mL | INTRAVENOUS | Status: AC | PRN
  Administered 2024-09-18 (×3): 2 mL via INTRAVENOUS

## 2024-09-19 ENCOUNTER — Ambulatory Visit (HOSPITAL_COMMUNITY): Admission: RE | Admit: 2024-09-19 | Source: Ambulatory Visit

## 2024-09-19 NOTE — Telephone Encounter (Signed)
Pt has not been seen in over a year.

## 2024-09-22 NOTE — Telephone Encounter (Signed)
 scheduled

## 2024-09-23 ENCOUNTER — Encounter: Payer: Self-pay | Admitting: Gastroenterology

## 2024-09-23 ENCOUNTER — Other Ambulatory Visit (HOSPITAL_COMMUNITY): Payer: Self-pay

## 2024-09-23 ENCOUNTER — Ambulatory Visit: Admitting: Gastroenterology

## 2024-09-23 ENCOUNTER — Other Ambulatory Visit: Payer: Self-pay

## 2024-09-23 VITALS — BP 136/88 | HR 73 | Temp 97.6°F | Ht 65.5 in | Wt 258.0 lb

## 2024-09-23 DIAGNOSIS — K449 Diaphragmatic hernia without obstruction or gangrene: Secondary | ICD-10-CM | POA: Diagnosis not present

## 2024-09-23 DIAGNOSIS — K625 Hemorrhage of anus and rectum: Secondary | ICD-10-CM

## 2024-09-23 DIAGNOSIS — R1310 Dysphagia, unspecified: Secondary | ICD-10-CM | POA: Diagnosis not present

## 2024-09-23 DIAGNOSIS — K2100 Gastro-esophageal reflux disease with esophagitis, without bleeding: Secondary | ICD-10-CM | POA: Diagnosis not present

## 2024-09-23 DIAGNOSIS — K642 Third degree hemorrhoids: Secondary | ICD-10-CM

## 2024-09-23 DIAGNOSIS — R112 Nausea with vomiting, unspecified: Secondary | ICD-10-CM

## 2024-09-23 DIAGNOSIS — K5900 Constipation, unspecified: Secondary | ICD-10-CM

## 2024-09-23 DIAGNOSIS — R1319 Other dysphagia: Secondary | ICD-10-CM

## 2024-09-23 MED ORDER — HYDROCORTISONE (PERIANAL) 2.5 % EX CREA
1.0000 | TOPICAL_CREAM | Freq: Two times a day (BID) | CUTANEOUS | 0 refills | Status: AC
  Filled 2024-09-23: qty 30, 10d supply, fill #0

## 2024-09-23 MED ORDER — PANTOPRAZOLE SODIUM 40 MG PO TBEC
40.0000 mg | DELAYED_RELEASE_TABLET | Freq: Two times a day (BID) | ORAL | 3 refills | Status: DC
  Filled 2024-09-23: qty 60, 30d supply, fill #0
  Filled 2024-10-18: qty 60, 30d supply, fill #1
  Filled 2024-11-21: qty 60, 30d supply, fill #2

## 2024-09-23 NOTE — Progress Notes (Signed)
 GI Office Note    Referring Provider: Edman Meade PEDLAR, FNP Primary Care Physician:  Edman Meade PEDLAR, FNP  Primary Gastroenterologist: Lamar HERO.Rourk, MD  Chief Complaint   Chief Complaint  Patient presents with   Gastroesophageal Reflux    Having issues with swallowing and heartburn. IBS   History of Present Illness   Rachel Underwood is a 53 y.o. female presenting today at the request of Edman Meade PEDLAR, FNP for issues with heartburn, dysphagia, and IBS.  EGD January 2016: - Erosive reflux esophagitis. - Noncritical Shatzkis ring - status post Maloney dilation.  - Very small hiatal hernia.  - Subtly abnormal gastric mucosa of uncertain clinical significance- status post biopsy - Path: Mild chronic inactive gastritis - Continue pantoprazole  40 mg once daily.  Colonoscopy January 2016: - Anal canal hemorrhoids- likely source of hematochezia.  - Few left- sided diverticula - normal colonoscopy otherwise - Ten- day course of Anusol suppositories 1 per rectum twice a day.  - Continue Amitiza  8  g twice daily.  - Add Benefiber 2 teaspoons twice daily to her regimen.  - Office visit with us  in 6 weeks.  GES in [place] with possible delayed gastric emptying per her report.   Today:  Discussed the use of AI scribe software for clinical note transcription with the patient, who gave verbal consent to proceed.  She has a history of GERD and was diagnosed with a hiatal hernia approximately five years ago. Her acid reflux is severe, worsens when lying down, and is accompanied by a sensation of her throat closing when swallowing. She experiences regurgitation of stomach contents into her mouth when lying down. She has been off pantoprazole  for three to four months since her release from prison in July 2025, where she was taking it once daily. She has been using her roommate's Dexilant , which provides partial relief. Was on pantoprazole  while in prison without significant relief.  She  previously used Dexilant  and Linzess , which were effective, but Linzess  290 caused severe diarrhea. Symptoms are nocturnal and severe but has ongoing reflux during the daytime.   She reports significant constipation, with difficulty having productive bowel movements for over a month. She describes straining and passing clumps of stool. Linzess  was effective in the past but caused severe diarrhea, requiring her to take it as needed. She recalls taking a high dose of Linzess  in the past, which caused severe pain and diarrhea. She has not had a productive bowel movement in over a month and experiences bloating and abdominal pain.  She has a history of esophageal issues, having undergone esophageal dilation two to four times. She reports a sensation of food getting stuck in her throat rarely and a feeling of choking even when swallowing without food or like something sticking her. She underwent a gastric emptying study in the past, which she reports indicated slow digestion and a hiatal hernia. She also reports possible repeat procedures in 2018 but she is not sure.    Per review of care everywhere she had a BPE in 2018 with [facility]: No evidence of esophageal stricture or mass. Normal esophageal motility.  Small hiatal hernia. Moderate to severe gastroesophageal reflux.   Her family history includes acid reflux and polyps, with both parents having had polyps, and her mother requiring a colostomy bag due to intestinal issues. She has lupus, diagnosed several years ago, and her daughter also has lupus. She has a history of multiple surgeries, including shoulder surgeries and ankle surgeries following a  work accident.  Her social history includes a history of smoking, which she resumed in July 2025 after being released from prison. She smokes variably, depending on stress levels, and occasionally uses a vape. She reports no alcohol use currently and has not used drugs in over a year, with the last cocaine use  in July 24.      Wt Readings from Last 6 Encounters:  09/23/24 258 lb (117 kg)  09/08/24 250 lb 6.4 oz (113.6 kg)  08/13/24 241 lb (109.3 kg)  08/11/24 242 lb 3.2 oz (109.9 kg)  07/30/24 232 lb (105.2 kg)  07/24/24 230 lb (104.3 kg)    Body mass index is 42.28 kg/m.  Current Outpatient Medications  Medication Sig Dispense Refill   albuterol  (VENTOLIN  HFA) 108 (90 Base) MCG/ACT inhaler INHALE 2 PUFFS INTO THE LUNGS EVERY 4 HOURS AS NEEDED FOR WHEEZING OR SHORTNESS OF BREATH. 18 g 2   amLODipine  (NORVASC ) 10 MG tablet Take 1 tablet (10 mg total) by mouth daily. 90 tablet 3   cariprazine  (VRAYLAR ) 3 MG capsule Take 1 capsule (3 mg total) by mouth every evening. 30 capsule 0   cromolyn  (OPTICROM ) 4 % ophthalmic solution Place 1 drop into both eyes daily. 10 mL 3   cycloSPORINE  (RESTASIS ) 0.05 % ophthalmic emulsion Place 1 drop into both eyes 2 (two) times daily. 180 each 2   eszopiclone  (LUNESTA ) 1 MG TABS tablet Take 1 tablet (1 mg total) by mouth at bedtime, for sleep. 30 tablet 1   hydrOXYzine  (ATARAX ) 50 MG tablet Take 1-2 tablets (50-100 mg total) by mouth every 12 (twelve) hours as needed. 60 tablet 2   ibuprofen  (ADVIL ) 800 MG tablet Take 1 tablet (800 mg total) by mouth every 8 (eight) hours as needed. 30 tablet 0   mirabegron ER (MYRBETRIQ) 25 MG TB24 tablet Take 1 tablet (25 mg total) by mouth daily. 30 tablet 11   nystatin  (MYCOSTATIN /NYSTOP ) powder Apply topically 3 times daily for up to 2 weeks for rash under breast. 30 g 1   ondansetron  (ZOFRAN -ODT) 4 MG disintegrating tablet Dissolve 1 tablet (4 mg total) by mouth 3 (three) times daily as needed. 20 tablet 3   pantoprazole  (PROTONIX ) 40 MG tablet      propranolol  (INDERAL ) 20 MG tablet Take 1 tablet (20 mg total) by mouth 2 (two) times daily. 180 tablet 3   QUEtiapine  (SEROQUEL ) 50 MG tablet Take 1-2 tablets (50-100 mg total) by mouth at bedtime. 60 tablet 1   rosuvastatin  (CRESTOR ) 10 MG tablet Take 1 tablet (10 mg total)  by mouth daily. 30 tablet 2   valACYclovir  (VALTREX ) 1000 MG tablet Take 1 tablet (1,000 mg total) by mouth daily. 30 tablet 5   bictegravir-emtricitabine -tenofovir  AF (BIKTARVY ) 50-200-25 MG TABS tablet Take 1 tablet by mouth daily. 30 tablet 5   bictegravir-emtricitabine -tenofovir  AF (BIKTARVY ) 50-200-25 MG TABS tablet Take 1 tablet by mouth daily. 30 tablet 5   Ensure (ENSURE) Take 237 mLs by mouth 2 (two) times daily between meals. (Patient not taking: Reported on 09/23/2024) 237 mL 6   EPINEPHrine  (EPIPEN  2-PAK) 0.3 mg/0.3 mL IJ SOAJ injection Inject 1 pen (0.3 mg) into the skin as directed as needed for anaphylaxis. (Patient not taking: Reported on 09/23/2024) 2 each 1   hydrOXYzine  (ATARAX ) 50 MG tablet Take 1 tablet (50 mg total) by mouth up to 3 (three) times daily as needed for anxiety. 90 tablet 1   lamoTRIgine  (LAMICTAL ) 100 MG tablet Take 1 tablet (100 mg total)  by mouth every evening. 30 tablet 1   lamoTRIgine  (LAMICTAL ) 200 MG tablet Take 200 mg by mouth daily.     lamoTRIgine  (LAMICTAL ) 200 MG tablet Take 1 tablet (200 mg total) by mouth daily. 30 tablet 2   lamoTRIgine  (LAMICTAL ) 200 MG tablet Take 1 tablet (200 mg total) by mouth every morning. 30 tablet 1   linaclotide  (LINZESS ) 290 MCG CAPS capsule Take 1 capsule by mouth daily on empty stomach (Patient not taking: Reported on 09/23/2024) 30 capsule 2   meloxicam (MOBIC) 7.5 MG tablet Take 1 tablet (7.5 mg total) by mouth daily. (Patient not taking: Reported on 09/23/2024) 30 tablet 5   methocarbamol  (ROBAXIN ) 500 MG tablet Take 1 tablet (500 mg total) by mouth 4 (four) times daily. (Patient not taking: Reported on 09/23/2024) 30 tablet 1   Oxycodone  HCl 10 MG TABS Take 10 mg by mouth every 4 hours as needed for Pain. (Patient not taking: Reported on 09/23/2024)     Pitavastatin  Magnesium  (ZYPITAMAG ) 4 MG TABS Take 4 mg by mouth daily. (Patient not taking: Reported on 09/23/2024)     QUEtiapine  (SEROQUEL ) 200 MG tablet Take 1  tablet (200 mg total) by mouth at bedtime. (Patient not taking: Reported on 09/23/2024) 30 tablet 2   sertraline  (ZOLOFT ) 100 MG tablet Take 2 tablets (200 mg total) by mouth daily. (Patient not taking: Reported on 09/23/2024) 60 tablet 3   terbinafine  (LAMISIL ) 250 MG tablet Take 1 tablet (250 mg total) by mouth daily. (Patient not taking: Reported on 09/23/2024) 30 tablet 4   valbenazine  (INGREZZA ) 60 MG capsule Take 1  capsule  by mouth at bedtime (Patient not taking: Reported on 09/23/2024) 30 capsule 1   No current facility-administered medications for this visit.    Past Medical History:  Diagnosis Date   Abnormal Pap smear of cervix 2005(EST)   ckc for abnl pap- [facility] [place]   Allergy    Asthma    Bipolar 1 disorder (HCC)    Borderline personality disorder (HCC)    BPD (bronchopulmonary dysplasia) (HCC)    Carpal tunnel syndrome    bilateral wrist   Depression    Dysmenorrhea 06/03/2014   Enlarged uterus 06/03/2014   Fibromyalgia    GERD (gastroesophageal reflux disease)    Herpes simplex without mention of complication    HIV (human immunodeficiency virus infection) (HCC)    Hyperlipidemia    Hypertension    Menorrhagia with irregular cycle 06/03/2014   Osteoarthritis    Seizures (HCC)    had as a teenager, unknown etiology and no meds.    Past Surgical History:  Procedure Laterality Date   ABDOMINAL HYSTERECTOMY N/A 09/01/2014   Procedure: HYSTERECTOMY ABDOMINAL;  Surgeon: Norleen Edsel GAILS, MD;  Location: [facility];  Service: Gynecology;  Laterality: N/A;   ANKLE SURGERY Right    repair of ligaments   BACK SURGERY     BILATERAL SALPINGECTOMY Bilateral 09/01/2014   Procedure: BILATERAL SALPINGECTOMY;  Surgeon: Norleen Edsel GAILS, MD;  Location: [facility];  Service: Gynecology;  Laterality: Bilateral;   CESAREAN SECTION     ckc     COLONOSCOPY N/A 12/23/2014   MFM:jwjo canal hemorrhoids left-sided diverticula   ESOPHAGOGASTRODUODENOSCOPY N/A 12/23/2014    MFM:[id number] reflux/noncritical shatzkis ring s/p dilation/small HH   EXCISION OF SKIN TAG N/A 09/01/2014   Procedure: EXCISION OF SKIN NEVUS;  Surgeon: Norleen Edsel GAILS, MD;  Location: [facility];  Service: Gynecology;  Laterality: N/A;   FRACTURE SURGERY  MALONEY DILATION N/A 12/23/2014   Procedure: AGAPITO DILATION;  Surgeon: Lamar CHRISTELLA Hollingshead, MD;  Location: [facility];  Service: Endoscopy;  Laterality: N/A;   SAVORY DILATION N/A 12/23/2014   Procedure: SAVORY DILATION;  Surgeon: Lamar CHRISTELLA Hollingshead, MD;  Location: [facility];  Service: Endoscopy;  Laterality: N/A;   SCAR REVISION N/A 09/01/2014   Procedure: EXCISION OF CICATRIX;  Surgeon: Norleen Edsel GAILS, MD;  Location: [facility];  Service: Gynecology;  Laterality: N/A;   SHOULDER SURGERY Right    rotator cuff   SPINE SURGERY      Family History  Problem Relation Age of Onset   Fibroids Mother    Hypertension Mother    Depression Mother    Irritable bowel syndrome Mother    Arthritis Mother    Heart disease Mother    Mental illness Mother    Stroke Mother    Vision loss Mother    Lupus Mother    Hypertension Father    Mental illness Father    Arthritis Father    Heart disease Father    Vision loss Father    Heart disease Maternal Grandmother    Cancer Maternal Grandmother        breast   Depression Maternal Grandmother    Hyperlipidemia Maternal Grandmother    Hypertension Maternal Grandmother    Bipolar disorder Daughter    Schizophrenia Daughter    Lupus Daughter    Cancer Paternal Grandfather        prostate   Heart disease Paternal Grandfather    Hyperlipidemia Paternal Grandfather    Hypertension Paternal Grandfather    Dementia Paternal Grandmother    Hyperlipidemia Paternal Grandmother    Hypertension Paternal Grandmother    Heart disease Maternal Grandfather    Alzheimer's disease Maternal Grandfather    Hyperlipidemia Maternal Grandfather    Hypertension Maternal Grandfather    Anxiety disorder Daughter    ADD  / ADHD Son    Hypertension Brother    Early death Brother    Learning disabilities Brother    Mental illness Brother    Colon cancer Neg Hx     Allergies as of 09/23/2024 - Review Complete 09/23/2024  Allergen Reaction Noted   Flecainide  Swelling 03/08/2016   Morphine Shortness Of Breath 07/24/2024   Piroxicam Anaphylaxis 05/12/2015   Abilify [aripiprazole]  05/12/2015   Citalopram Hives 05/12/2015   Codeine Nausea And Vomiting 03/25/2014   Depakote [divalproex sodium] Swelling 05/12/2015   Gabapentin  05/13/2014   Haldol [haloperidol lactate] Other (See Comments) 03/25/2014   Haloperidol  09/06/2020   Indomethacin Swelling 12/11/2014   Invega [paliperidone] Other (See Comments) 06/10/2014   Latuda  [lurasidone  hcl] Itching 05/12/2015   Lithium Hives 06/03/2014   Lurasidone   09/06/2020   Promethazine  Swelling 02/03/2015   Trazodone and nefazodone Other (See Comments) 12/18/2014   Lexapro [escitalopram oxalate] Rash 07/15/2013    Social History   Socioeconomic History   Marital status: Divorced    Spouse name: Not on file   Number of children: 3   Years of education: Not on file   Highest education level: Not on file  Occupational History   Not on file  Tobacco Use   Smoking status: Every Day    Types: E-cigarettes, Cigarettes   Smokeless tobacco: Never   Tobacco comments:    1 PPD  Vaping Use   Vaping status: Every Day  Substance and Sexual Activity   Alcohol use: Not Currently    Comment: Very arely  Drug use: No    Types: Crack cocaine    Comment: last cocaine use 11/2022   Sexual activity: Not Currently    Partners: Male    Birth control/protection: Surgical    Comment: hysterectomy  Other Topics Concern   Not on file  Social History Narrative   Lives with a friend at the moment    Social Drivers of Health   Financial Resource Strain: Not on file  Food Insecurity: Not on file  Transportation Needs: Not on file  Physical Activity: Inactive  (06/01/2022)   Exercise Vital Sign    Days of Exercise per Week: 0 days    Minutes of Exercise per Session: 0 min  Stress: Stress Concern Present (06/01/2022)   [employer] of Occupational Health - Occupational Stress Questionnaire    Feeling of Stress : To some extent  Social Connections: Unknown (04/18/2022)   Received from [facility]   Social Network    Social Network: Not on file  Intimate Partner Violence: Unknown (03/08/2022)   Received from [facility]   HITS    Physically Hurt: Not on file    Insult or Talk Down To: Not on file    Threaten Physical Harm: Not on file    Scream or Curse: Not on file     Review of Systems   Gen: Denies any fever, chills, fatigue, weight loss, lack of appetite.  CV: Denies chest pain, heart palpitations, peripheral edema, syncope.  Resp: Denies shortness of breath at rest or with exertion. Denies wheezing or cough.  GI: see HPI GU : + urinary frequency and urgency (OAB) Denies urinary hesitancy MS: + shoulder pain. Denies muscle weakness, cramps, or limitation of movement.  Derm: Denies rash, itching, dry skin Psych: + depression and anxiety. Denies memory loss, and confusion Heme: Denies bruising, bleeding, and enlarged lymph nodes.  Physical Exam   BP 136/88 (BP Location: Right Arm, Patient Position: Sitting, Cuff Size: Large)   Pulse 73   Temp 97.6 F (36.4 C) (Temporal)   Ht 5' 5.5 (1.664 m)   Wt 258 lb (117 kg)   LMP 08/11/2014   BMI 42.28 kg/m   General:   Alert and oriented. Pleasant and cooperative. Well-nourished and well-developed.  Head:  Normocephalic and atraumatic. Eyes:  Without icterus, sclera clear and conjunctiva pink.  Ears:  Normal auditory acuity. Mouth:  No deformity or lesions, oral mucosa pink.  Abdomen:  +BS, soft,  non-distended.  TTP to umbilical region, epigastric, and LUQ. No HSM noted. No guarding or rebound. No masses appreciated.  Rectal:  deferred Msk:  Symmetrical without gross  deformities. Normal posture. Neurologic:  Alert and  oriented x4;  grossly normal neurologically. Skin:  Intact without significant lesions or rashes. Psych:  Alert and cooperative. Normal mood and affect.  Assessment & Plan   Rachel Underwood is a 53 y.o. female with a history of GERD, fibromyalgia, dysphagia s/p dilations in the past, carpal tunnel, bipolar 1 disorder, borderline personality disorder, HSV, HIV, HLD, HTN, seizures, and hemorrhoids presenting today with complaints of constipation, GERD, dysphagia.    Gastroesophageal reflux disease with hiatal hernia and dysphagia Severe GERD with persistent symptoms despite previous pantoprazole  use in prison and currently not on anything since July. Symptoms include throat pain, sensation of throat closing, and regurgitation when lying down. Dysphagia with sensation of food sticking in the throat, present for about a year. Previous esophageal dilation and hiatal hernia noted. Family history of acid reflux. Current use of roommate's  Dexilant  providing some relief. Medicaid likely to deny Dexilant  without trial of pantoprazole  twice daily therefore will start with this. History of reflux esophagitis in 2018. Needs eval for h. Pylori in the future if no improvement in symptoms.  - Prescribed pantoprazole  40 mg twice daily. - Will reassess symptoms in two weeks. - If symptoms persist, will plan for upper endoscopy in January with colonoscopy. - GERD diet advised - May try trial of carafate if persistent symptoms.   Chronic constipation Severe constipation with infrequent bowel movements and straining. Previous use of Linzess  resulted in severe diarrhea, leading to discontinuation, did not get past washout period. No blood in stool. Will trial Linzess  145 given tried 290 with severe diarrhea previously. If effective will send prescription and if not covered by Medicaid then will trial alternate.  - Provided samples of Linzess  145 mcg daily to be taken on  an empty stomach. - Discussed washout period and potential side effects.  - Monitor for improvement in constipation symptoms over two weeks. - If effective, will send prescription for Linzess . - If Medicaid denies Linzess , will consider alternative treatments.  Prolapsed internal hemorrhoids Prolapsed internal hemorrhoids with bleeding and irritation, exacerbated by straining during bowel movements. Hemorrhoids prolapse and reduce spontaneously or with assistance but increase in size with straining. Previous use of over-the-counter hemorrhoid cream. Discussed potential for banding procedure if symptoms do not improve. Will perform rectal exam next visit. Possible anoscopy. - Prescribed Anusol cream for hemorrhoid management - BID for 7 days - Provided pamphlet on hemorrhoid banding procedure. - Will evaluate hemorrhoids in January before colonoscopy for potential banding.      Follow up   Follow up early January to discuss procedures and medication effectiveness.    Charmaine Melia, MSN, FNP-BC, AGACNP-BC [facility]

## 2024-09-23 NOTE — Patient Instructions (Addendum)
 I am providing you samples of Linzess  145 mcg. See below for instructions on taking and expectations.  How to take Linzess : Once a day every day on empty stomach, at least 30 minutes before your first meal of the day. It is best to keep medications at a stable temperature Medication is best kept in its original bottle with the disket present.  It is a medication that is meant for everyday use and not to be used as needed.   What to expect: Constipation relief is typically felt in about 1 week Relief of abdominal pain, discomfort, and bloating begins in about 1 week with symptoms typically improving over 12 weeks and beyond. Diarrhea is most common side effect and typically begins within the first 2 weeks and can take 3-4 weeks to resolve It would be helpful to begin treatment over the weekend or when you can be closer to a bathroom   You can go to [email or web address] for patient support and sign up for daily medication reminders.   Please let me know how you are doing with the Linzess  within the next 2 weeks once you are close to being done with your samples, if you would like prescription please let me know or if it is not effective please also let me know and we can go back to the higher dose.  If effective I will send in prescription, and if not covered by insurance we will try one of their alternatives.  I have sent in pantoprazole  40 mg twice daily for you to take for your reflux.  Please let me know if you are not having any improvement in 2 weeks.  At that point I may send in Carafate for you which is a liquid that helps with reflux and consider switching you to Dexilant  at that time.  Follow a GERD diet:  Avoid fried, fatty, greasy, spicy, citrus foods. Avoid caffeine and carbonated beverages. Avoid chocolate. Try eating 4-6 small meals a day rather than 3 large meals. Do not eat within 3 hours of laying down. Prop head of bed up on wood or bricks to create a 6 inch incline.  I  have sent in Anusol cream for you to apply twice daily for at least a week to your hemorrhoid tissue, internally and externally.  I will see you for follow-up in early January and at that time we will discuss performing your colonoscopy as well as upper endoscopy especially if you are continuing to have issues with swallowing and reflux.  At that time I can also perform rectal exam to further evaluate your hemorrhoids if these are still remaining to be a problem.  It was a pleasure to see you today. I want to create trusting relationships with patients. If you receive a survey regarding your visit,  I greatly appreciate you taking time to fill this out on paper or through your MyChart. I value your feedback.  Charmaine Melia, MSN, FNP-BC, AGACNP-BC [facility]

## 2024-09-24 ENCOUNTER — Other Ambulatory Visit (HOSPITAL_COMMUNITY): Payer: Self-pay

## 2024-09-24 ENCOUNTER — Other Ambulatory Visit: Payer: Self-pay | Admitting: *Deleted

## 2024-09-24 ENCOUNTER — Other Ambulatory Visit: Payer: Self-pay

## 2024-09-24 ENCOUNTER — Other Ambulatory Visit: Payer: Self-pay | Admitting: Pharmacist

## 2024-09-24 MED ORDER — SERTRALINE HCL 100 MG PO TABS
200.0000 mg | ORAL_TABLET | Freq: Every day | ORAL | 4 refills | Status: DC
  Filled 2024-09-24: qty 60, 30d supply, fill #0

## 2024-09-24 MED ORDER — QUETIAPINE FUMARATE 100 MG PO TABS
100.0000 mg | ORAL_TABLET | Freq: Three times a day (TID) | ORAL | 4 refills | Status: AC
  Filled 2024-09-24: qty 90, 30d supply, fill #0
  Filled 2024-11-14: qty 90, 30d supply, fill #1

## 2024-09-24 MED ORDER — LINACLOTIDE 145 MCG PO CAPS: 145.0000 ug | ORAL_CAPSULE | Freq: Every day | ORAL | Status: DC

## 2024-09-24 MED ORDER — LAMOTRIGINE 200 MG PO TABS
200.0000 mg | ORAL_TABLET | Freq: Every day | ORAL | 4 refills | Status: AC
  Filled 2024-09-24 – 2024-10-18 (×2): qty 30, 30d supply, fill #0
  Filled 2024-11-14: qty 30, 30d supply, fill #1
  Filled 2024-12-13: qty 30, 30d supply, fill #2

## 2024-09-24 NOTE — Progress Notes (Signed)
 Medication Samples have been provided to the patient.  Drug name: Linzess        Strength:         Qty: 4 boxes   LOT: [id number]  Exp.Date: 02/2025  Dosing instructions: Take one 30 mins before breakfast   The patient has been instructed regarding the correct time, dose, and frequency of taking this medication, including desired effects and most common side effects.   Rachel Underwood 10:20 AM 09/24/2024

## 2024-09-24 NOTE — Progress Notes (Signed)
 Extra initial clinical entered in error - removed. Recurrence due in 6 months.   Alan Geralds, PharmD, CPP, BCIDP, AAHIVP Clinical Pharmacist Practitioner Infectious Diseases Clinical Pharmacist [facility]

## 2024-09-25 ENCOUNTER — Ambulatory Visit: Admitting: Family Medicine

## 2024-09-26 ENCOUNTER — Other Ambulatory Visit: Payer: Self-pay

## 2024-09-26 ENCOUNTER — Ambulatory Visit (HOSPITAL_COMMUNITY)

## 2024-09-26 NOTE — Progress Notes (Signed)
 Patient reported no longer taking Ingrezza . Provider has d/c. Dis-enrolling.

## 2024-09-27 ENCOUNTER — Ambulatory Visit: Payer: Self-pay | Admitting: Cardiology

## 2024-09-27 DIAGNOSIS — R072 Precordial pain: Secondary | ICD-10-CM

## 2024-09-27 DIAGNOSIS — R0609 Other forms of dyspnea: Secondary | ICD-10-CM

## 2024-09-29 ENCOUNTER — Other Ambulatory Visit: Payer: Self-pay

## 2024-09-29 ENCOUNTER — Ambulatory Visit: Admitting: Internal Medicine

## 2024-09-29 ENCOUNTER — Other Ambulatory Visit (HOSPITAL_COMMUNITY)
Admission: RE | Admit: 2024-09-29 | Discharge: 2024-09-29 | Disposition: A | Discharge status: Home / Self Care | Source: Ambulatory Visit | Attending: Internal Medicine | Admitting: Internal Medicine

## 2024-09-29 ENCOUNTER — Encounter: Payer: Self-pay | Admitting: Internal Medicine

## 2024-09-29 VITALS — BP 149/93 | HR 62 | Temp 97.9°F | Ht 65.0 in | Wt 259.0 lb

## 2024-09-29 DIAGNOSIS — Z79899 Other long term (current) drug therapy: Secondary | ICD-10-CM

## 2024-09-29 DIAGNOSIS — Z23 Encounter for immunization: Secondary | ICD-10-CM | POA: Diagnosis not present

## 2024-09-29 DIAGNOSIS — Z113 Encounter for screening for infections with a predominantly sexual mode of transmission: Secondary | ICD-10-CM | POA: Insufficient documentation

## 2024-09-29 DIAGNOSIS — B20 Human immunodeficiency virus [HIV] disease: Secondary | ICD-10-CM | POA: Diagnosis present

## 2024-09-29 DIAGNOSIS — Z8619 Personal history of other infectious and parasitic diseases: Secondary | ICD-10-CM | POA: Diagnosis not present

## 2024-09-29 DIAGNOSIS — F319 Bipolar disorder, unspecified: Secondary | ICD-10-CM

## 2024-09-29 MED ORDER — ACYCLOVIR 200 MG PO CAPS
400.0000 mg | ORAL_CAPSULE | Freq: Two times a day (BID) | ORAL | 11 refills | Status: AC
  Filled 2024-09-29: qty 120, 30d supply, fill #0
  Filled 2024-11-14: qty 120, 30d supply, fill #1
  Filled 2025-01-04: qty 120, 30d supply, fill #2

## 2024-09-29 NOTE — Progress Notes (Unsigned)
 RFV: HIV disease  Patient ID: Rachel Underwood, female   DOB: 09-21-1971, 53 y.o.   MRN: [id number]  HPI Rachel Underwood is a 53yo F with HIV disease, HTN, bipolar disorder, currently taking biktarvy  daily. She is Well controlled on recent labs at end of August. She also noticed that valtrex  not as good as acyclovir  for her outbreaks. Not currently having them but would like to switch back to acyclovir . She is otherwise doing okay. No complaints about her health.  Outpatient Encounter Medications as of 09/29/2024  Medication Sig   albuterol  (VENTOLIN  HFA) 108 (90 Base) MCG/ACT inhaler INHALE 2 PUFFS INTO THE LUNGS EVERY 4 HOURS AS NEEDED FOR WHEEZING OR SHORTNESS OF BREATH.   amLODipine  (NORVASC ) 10 MG tablet Take 1 tablet (10 mg total) by mouth daily.   bictegravir-emtricitabine -tenofovir  AF (BIKTARVY ) 50-200-25 MG TABS tablet Take 1 tablet by mouth daily.   bictegravir-emtricitabine -tenofovir  AF (BIKTARVY ) 50-200-25 MG TABS tablet Take 1 tablet by mouth daily.   cariprazine  (VRAYLAR ) 3 MG capsule Take 1 capsule (3 mg total) by mouth every evening.   cromolyn  (OPTICROM ) 4 % ophthalmic solution Place 1 drop into both eyes daily.   cycloSPORINE  (RESTASIS ) 0.05 % ophthalmic emulsion Place 1 drop into both eyes 2 (two) times daily.   EPINEPHrine  (EPIPEN  2-PAK) 0.3 mg/0.3 mL IJ SOAJ injection Inject 1 pen (0.3 mg) into the skin as directed as needed for anaphylaxis.   eszopiclone  (LUNESTA ) 1 MG TABS tablet Take 1 tablet (1 mg total) by mouth at bedtime, for sleep.   hydrocortisone (ANUSOL-HC) 2.5 % rectal cream Place 1 Application rectally 2 (two) times daily.   hydrOXYzine  (ATARAX ) 50 MG tablet Take 1 tablet (50 mg total) by mouth up to 3 (three) times daily as needed for anxiety.   ibuprofen  (ADVIL ) 800 MG tablet Take 1 tablet (800 mg total) by mouth every 8 (eight) hours as needed.   lamoTRIgine  (LAMICTAL ) 100 MG tablet Take 1 tablet (100 mg total) by mouth every evening.   lamoTRIgine  (LAMICTAL ) 200 MG  tablet Take 1 tablet (200 mg total) by mouth every morning.   lamoTRIgine  (LAMICTAL ) 200 MG tablet Take 1 tablet (200 mg total) by mouth daily.   linaclotide  (LINZESS ) 145 MCG CAPS capsule Take 1 capsule (145 mcg total) by mouth daily before breakfast.   mirabegron ER (MYRBETRIQ) 25 MG TB24 tablet Take 1 tablet (25 mg total) by mouth daily.   nystatin  (MYCOSTATIN /NYSTOP ) powder Apply topically 3 times daily for up to 2 weeks for rash under breast.   ondansetron  (ZOFRAN -ODT) 4 MG disintegrating tablet Dissolve 1 tablet (4 mg total) by mouth 3 (three) times daily as needed.   pantoprazole  (PROTONIX ) 40 MG tablet Take 1 tablet (40 mg total) by mouth 2 (two) times daily before a meal.   propranolol  (INDERAL ) 20 MG tablet Take 1 tablet (20 mg total) by mouth 2 (two) times daily.   QUEtiapine  (SEROQUEL ) 100 MG tablet Take 1 tablet (100 mg total) by mouth 3 (three) times daily.   QUEtiapine  (SEROQUEL ) 50 MG tablet Take 1-2 tablets (50-100 mg total) by mouth at bedtime.   rosuvastatin  (CRESTOR ) 10 MG tablet Take 1 tablet (10 mg total) by mouth daily.   sertraline  (ZOLOFT ) 100 MG tablet Take 2 tablets (200 mg total) by mouth daily.   valACYclovir  (VALTREX ) 1000 MG tablet Take 1 tablet (1,000 mg total) by mouth daily.   No facility-administered encounter medications on file as of 09/29/2024.     Patient Active Problem List   Diagnosis Date Noted  Injury of head 03/15/2023   Respiratory infection 10/17/2022   Palpitations 10/03/2022   URI (upper respiratory infection) 08/18/2022   PSVT (paroxysmal supraventricular tachycardia) 05/18/2022   Morbid obesity (HCC) 05/18/2022   Cocaine abuse (HCC) 05/18/2022   High risk sexual behavior 05/18/2022   Asthma 05/18/2022   Plantar wart 02/01/2021   Tobacco abuse disorder 02/01/2021   Positive ANA (antinuclear antibody) 01/28/2021   Arthritis 01/28/2021   Rash 01/28/2021   Fibromyalgia syndrome 01/28/2021   Bipolar disorder in partial remission  11/14/2016   Bilateral carpal tunnel syndrome 02/17/2016   Allergic rhinitis due to pollen 02/17/2016   Headache 11/09/2015   Back pain with history of spinal surgery 11/09/2015   Diarrhea 05/19/2015   Cysts of both ovaries 05/12/2015   Abdominal wall hernia 02/03/2015   Abdominal wall mass of periumbilical region 02/03/2015   Schatzki's ring    Diverticulosis of colon without hemorrhage    Other hemorrhoids    Rectal bleeding 12/11/2014   GERD (gastroesophageal reflux disease) 12/11/2014   Esophageal dysphagia 12/11/2014   Constipation 12/11/2014   S/P hysterectomy 09/01/2014   Pelvic pain in female 07/22/2014   Anovulation 06/18/2014   HTN (hypertension) 01/15/2014   Human immunodeficiency virus (HIV) disease (HCC) 07/17/2013   Schizophrenia (HCC) 07/17/2013   Bipolar disorder, unspecified (HCC) 07/17/2013     Health Maintenance Due  Topic Date Due   Hepatitis B Vaccines 19-59 Average Risk (2 of 3 - 19+ 3-dose series) 01/01/2007   Pneumococcal Vaccine: 50+ Years (2 of 2 - PCV) 09/03/2013   Mammogram  01/27/2017   Zoster Vaccines- Shingrix (2 of 2) 09/24/2024   Colonoscopy  12/23/2024     Review of Systems Review of Systems  Constitutional: Negative for fever, chills, diaphoresis, activity change, appetite change, fatigue and unexpected weight change.  HENT: Negative for congestion, sore throat, rhinorrhea, sneezing, trouble swallowing and sinus pressure.  Eyes: Negative for photophobia and visual disturbance.  Respiratory: Negative for cough, chest tightness, shortness of breath, wheezing and stridor.  Cardiovascular: Negative for chest pain, palpitations and leg swelling.  Gastrointestinal: Negative for nausea, vomiting, abdominal pain, diarrhea, constipation, blood in stool, abdominal distention and anal bleeding.  Genitourinary: Negative for dysuria, hematuria, flank pain and difficulty urinating.  Musculoskeletal: Negative for myalgias, back pain, joint swelling,  arthralgias and gait problem.  Skin: Negative for color change, pallor, rash and wound.  Neurological: Negative for dizziness, tremors, weakness and light-headedness.  Hematological: Negative for adenopathy. Does not bruise/bleed easily.  Psychiatric/Behavioral: Negative for behavioral problems, confusion, sleep disturbance, dysphoric mood, decreased concentration and agitation.    Physical Exam   BP (!) 149/93   Pulse 62   Temp 97.9 F (36.6 C) (Temporal)   Ht 5' 5 (1.651 m)   Wt 259 lb (117.5 kg)   LMP 08/11/2014   SpO2 98%   BMI 43.10 kg/m   Physical Exam  Constitutional:  oriented to person, place, and time. appears well-developed and well-nourished. No distress.  HENT: /AT, PERRLA, no scleral icterus Mouth/Throat: Oropharynx is clear and moist. No oropharyngeal exudate.  Cardiovascular: Normal rate, regular rhythm and normal heart sounds. Exam reveals no gallop and no friction rub.  No murmur heard.  Pulmonary/Chest: Effort normal and breath sounds normal. No respiratory distress.  has no wheezes.  Neck = supple, no nuchal rigidity Lymphadenopathy: no cervical adenopathy. No axillary adenopathy Neurological: alert and oriented to person, place, and time.  Skin: Skin is warm and dry. No rash noted. No erythema.  Psychiatric: a  normal mood and affect.  behavior is normal.   Lab Results  Component Value Date   CD4TCELL 39 01/01/2023   Lab Results  Component Value Date   CD4TABS 947 01/01/2023   CD4TABS 846 08/15/2022   CD4TABS 772 06/13/2022   Lab Results  Component Value Date   HIV1RNAQUANT NOT DETECTED 07/24/2024   Lab Results  Component Value Date   HEPBSAB POS (A) 07/15/2013   Lab Results  Component Value Date   LABRPR NON-REACTIVE 03/26/2023    CBC Lab Results  Component Value Date   WBC 6.3 03/26/2023   RBC 3.76 (L) 03/26/2023   HGB 12.3 03/26/2023   HCT 36.1 03/26/2023   PLT 184 03/26/2023   MCV 96.0 03/26/2023   MCH 32.7 03/26/2023   MCHC  34.1 03/26/2023   RDW 12.5 03/26/2023   LYMPHSABS 2,344 03/26/2023   MONOABS 528 05/10/2016   EOSABS 69 03/26/2023    BMET Lab Results  Component Value Date   NA 141 03/26/2023   K 4.1 03/26/2023   CL 106 03/26/2023   CO2 30 03/26/2023   GLUCOSE 90 03/26/2023   BUN 8 03/26/2023   CREATININE 0.69 03/26/2023   CALCIUM  9.1 03/26/2023   GFRNONAA >60 10/23/2022   GFRAA 88 09/23/2020      Assessment and Plan  Hiv disease - will check her HIV Labs, cd 4 count and VL to see she is still well controlled  Long term medication management = will check cr/ hgh risk medication - reviewed to ensure no drug interactions  Health maintenance = will give vaccines for Flu and pneumonia  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

## 2024-09-30 ENCOUNTER — Ambulatory Visit

## 2024-09-30 ENCOUNTER — Ambulatory Visit (HOSPITAL_COMMUNITY): Admission: RE | Admit: 2024-09-30 | Source: Ambulatory Visit

## 2024-09-30 LAB — URINE CYTOLOGY ANCILLARY ONLY
Chlamydia: NEGATIVE
Comment: NEGATIVE
Comment: NORMAL
Neisseria Gonorrhea: NEGATIVE

## 2024-09-30 LAB — T-HELPER CELL (CD4) - (RCID CLINIC ONLY)
CD4 % Helper T Cell: 34 % (ref 33–65)
CD4 T Cell Abs: 733 /uL (ref 400–1790)

## 2024-10-01 ENCOUNTER — Other Ambulatory Visit (HOSPITAL_COMMUNITY): Payer: Self-pay

## 2024-10-01 ENCOUNTER — Other Ambulatory Visit: Payer: Self-pay

## 2024-10-02 LAB — CBC WITH DIFFERENTIAL/PLATELET
Absolute Lymphocytes: 2181 {cells}/uL (ref 850–3900)
Absolute Monocytes: 590 {cells}/uL (ref 200–950)
Basophils Absolute: 41 {cells}/uL (ref 0–200)
Basophils Relative: 0.5 %
Eosinophils Absolute: 74 {cells}/uL (ref 15–500)
Eosinophils Relative: 0.9 %
HCT: 38.6 % (ref 35.0–45.0)
Hemoglobin: 12.8 g/dL (ref 11.7–15.5)
MCH: 32.2 pg (ref 27.0–33.0)
MCHC: 33.2 g/dL (ref 32.0–36.0)
MCV: 97.2 fL (ref 80.0–100.0)
MPV: 9 fL (ref 7.5–12.5)
Monocytes Relative: 7.2 %
Neutro Abs: 5314 {cells}/uL (ref 1500–7800)
Neutrophils Relative %: 64.8 %
Platelets: 223 Thousand/uL (ref 140–400)
RBC: 3.97 Million/uL (ref 3.80–5.10)
RDW: 13 % (ref 11.0–15.0)
Total Lymphocyte: 26.6 %
WBC: 8.2 Thousand/uL (ref 3.8–10.8)

## 2024-10-02 LAB — COMPLETE METABOLIC PANEL WITHOUT GFR
AG Ratio: 1.8 (calc) (ref 1.0–2.5)
ALT: 14 U/L (ref 6–29)
AST: 19 U/L (ref 10–35)
Albumin: 4.4 g/dL (ref 3.6–5.1)
Alkaline phosphatase (APISO): 59 U/L (ref 37–153)
BUN: 15 mg/dL (ref 7–25)
CO2: 27 mmol/L (ref 20–32)
Calcium: 9.4 mg/dL (ref 8.6–10.4)
Chloride: 104 mmol/L (ref 98–110)
Creat: 0.77 mg/dL (ref 0.50–1.03)
Globulin: 2.5 g/dL (ref 1.9–3.7)
Glucose, Bld: 90 mg/dL (ref 65–99)
Potassium: 4.7 mmol/L (ref 3.5–5.3)
Sodium: 138 mmol/L (ref 135–146)
Total Bilirubin: 0.4 mg/dL (ref 0.2–1.2)
Total Protein: 6.9 g/dL (ref 6.1–8.1)

## 2024-10-02 LAB — HIV-1 RNA QUANT-NO REFLEX-BLD
HIV 1 RNA Quant: NOT DETECTED {copies}/mL
HIV-1 RNA Quant, Log: NOT DETECTED {Log_copies}/mL

## 2024-10-02 LAB — LIPID PANEL
Cholesterol: 161 mg/dL (ref <200)
HDL: 70 mg/dL (ref >=50)
LDL Cholesterol (Calc): 72 mg/dL
Non-HDL Cholesterol (Calc): 91 mg/dL (ref <130)
Total CHOL/HDL Ratio: 2.3 (calc) (ref <5.0)
Triglycerides: 102 mg/dL (ref <150)

## 2024-10-02 LAB — RPR: RPR Ser Ql: NONREACTIVE

## 2024-10-06 NOTE — Addendum Note (Signed)
 Addended by: CHAUVIGNE, Abeera Flannery on: 10/06/2024 01:22 PM   Modules accepted: Orders

## 2024-10-06 NOTE — Telephone Encounter (Signed)
 Spoke to the patient over the phone. Still has shortness of breath -also has URI symptoms which could be contributory. Has not been checking her blood pressures at home, recommended to do so as this could be contributory and follow up with PCP.  Also encouraged her to reduce/stop smoking.  We discussed undergoing additional workup given her symptoms and risk factors.  Patient would like to proceed forward as she states she wants to know more about her heart I have been said different things from different doctors.  We discussed both myocardial perfusion imaging as well as coronary CTA.  Risks, benefits, alternatives discussed.  Shared decision was to proceed with coronary CTA in about 2 weeks once her URI symptoms have resolved.  Does not need a urine pregnancy test, patient endorses history of hysterectomy.   Recent labs done on 09/29/2024 reviewed  Already on propranolol .  She takes propranolol  20 mg p.o. twice daily, patient is advised to take propranolol  40 mg p.o. every morning on the day of her CT scan.   Please order a coronary CTA.  Follow-up in 1 week thereafter.   Geza Beranek [place], DO, FACC

## 2024-10-09 ENCOUNTER — Ambulatory Visit: Payer: Self-pay | Admitting: Physician Assistant

## 2024-10-09 ENCOUNTER — Other Ambulatory Visit (HOSPITAL_COMMUNITY): Payer: Self-pay

## 2024-10-09 ENCOUNTER — Ambulatory Visit: Admitting: Family Medicine

## 2024-10-10 ENCOUNTER — Ambulatory Visit (HOSPITAL_COMMUNITY)
Admission: RE | Admit: 2024-10-10 | Discharge: 2024-10-10 | Disposition: A | Discharge status: Home / Self Care | Source: Ambulatory Visit | Attending: Orthopedic Surgery | Admitting: Orthopedic Surgery

## 2024-10-10 DIAGNOSIS — G8929 Other chronic pain: Secondary | ICD-10-CM | POA: Diagnosis present

## 2024-10-10 DIAGNOSIS — M25511 Pain in right shoulder: Secondary | ICD-10-CM | POA: Insufficient documentation

## 2024-10-12 ENCOUNTER — Other Ambulatory Visit (HOSPITAL_COMMUNITY): Payer: Self-pay

## 2024-10-12 ENCOUNTER — Encounter (INDEPENDENT_AMBULATORY_CARE_PROVIDER_SITE_OTHER): Payer: Self-pay

## 2024-10-13 ENCOUNTER — Other Ambulatory Visit: Payer: Self-pay

## 2024-10-13 NOTE — Progress Notes (Signed)
 Specialty Pharmacy Refill Coordination Note  Rachel Underwood is a 53 y.o. female contacted today regarding refills of specialty medication(s) Bictegravir-Emtricitab-Tenofov (Biktarvy )   Patient requested (Patient-Rptd) Delivery   Delivery date: 11/04/24   Verified address: (Patient-Rptd) [address],  [place], [place] [address]   Medication will be filled on: 11/03/24

## 2024-10-16 ENCOUNTER — Ambulatory Visit (INDEPENDENT_AMBULATORY_CARE_PROVIDER_SITE_OTHER): Payer: Self-pay | Admitting: Family Medicine

## 2024-10-16 ENCOUNTER — Other Ambulatory Visit (HOSPITAL_COMMUNITY): Payer: Self-pay

## 2024-10-16 ENCOUNTER — Other Ambulatory Visit: Payer: Self-pay

## 2024-10-16 ENCOUNTER — Other Ambulatory Visit (HOSPITAL_BASED_OUTPATIENT_CLINIC_OR_DEPARTMENT_OTHER): Payer: Self-pay

## 2024-10-16 VITALS — BP 134/83 | HR 61 | Temp 98.1°F | Ht 65.0 in | Wt 260.4 lb

## 2024-10-16 DIAGNOSIS — J0110 Acute frontal sinusitis, unspecified: Secondary | ICD-10-CM

## 2024-10-16 MED ORDER — BENZONATATE 200 MG PO CAPS
200.0000 mg | ORAL_CAPSULE | Freq: Three times a day (TID) | ORAL | 0 refills | Status: DC | PRN
  Filled 2024-10-16: qty 30, 10d supply, fill #0

## 2024-10-16 MED ORDER — AMOXICILLIN-POT CLAVULANATE 875-125 MG PO TABS
1.0000 | ORAL_TABLET | Freq: Two times a day (BID) | ORAL | 0 refills | Status: DC
  Filled 2024-10-16: qty 14, 7d supply, fill #0

## 2024-10-16 NOTE — Assessment & Plan Note (Signed)
 Treating with Augmentin  and Tessalon  Perles.

## 2024-10-16 NOTE — Progress Notes (Signed)
 Subjective:  Patient ID: Rachel Underwood, female    DOB: 1970/12/15  Age: 53 y.o. MRN: [id number]  CC:   Chief Complaint  Patient presents with   Sinus Problem    Patient is here for having sinus drainage for the past week. She mention, when she coughs anything up its a yellowish color. She has not ran a fever.     HPI:  53 year old female presents for evaluation of the above.  Patient reports symptoms for about a week.  Reports sinus pressure and congestion particularly in the frontal region.  Associated postnasal drip.  She also reports cough.  No relieving factors.  No fever.  Patient Active Problem List   Diagnosis Date Noted   Acute frontal sinusitis 10/16/2024   Palpitations 10/03/2022   PSVT (paroxysmal supraventricular tachycardia) 05/18/2022   Morbid obesity (HCC) 05/18/2022   Cocaine abuse (HCC) 05/18/2022   Asthma 05/18/2022   Tobacco abuse disorder 02/01/2021   Positive ANA (antinuclear antibody) 01/28/2021   Arthritis 01/28/2021   Fibromyalgia syndrome 01/28/2021   Bipolar disorder in partial remission 11/14/2016   Bilateral carpal tunnel syndrome 02/17/2016   Allergic rhinitis due to pollen 02/17/2016   Back pain with history of spinal surgery 11/09/2015   Schatzki's ring    Diverticulosis of colon without hemorrhage    Other hemorrhoids    GERD (gastroesophageal reflux disease) 12/11/2014   S/P hysterectomy 09/01/2014   HTN (hypertension) 01/15/2014   Human immunodeficiency virus (HIV) disease (HCC) 07/17/2013    Social Hx   Social History   Socioeconomic History   Marital status: Divorced    Spouse name: Not on file   Number of children: 3   Years of education: Not on file   Highest education level: Some college, no degree  Occupational History   Not on file  Tobacco Use   Smoking status: Every Day    Current packs/day: 0.50    Average packs/day: 0.5 packs/day for 0.4 years (0.2 ttl pk-yrs)    Types: E-cigarettes, Cigarettes    Start date:  06/2024   Smokeless tobacco: Never   Tobacco comments:    1 PPD  Vaping Use   Vaping status: Some Days  Substance and Sexual Activity   Alcohol use: Not Currently   Drug use: No    Types: Crack cocaine    Comment: last cocaine use July 2024   Sexual activity: Not Currently    Partners: Male    Birth control/protection: Surgical    Comment: declined condoms  Other Topics Concern   Not on file  Social History Narrative   Lives with a friend at the moment    Social Drivers of Health   Financial Resource Strain: High Risk (09/26/2024)   Overall Financial Resource Strain (CARDIA)    Difficulty of Paying Living Expenses: Hard  Food Insecurity: Food Insecurity Present (09/26/2024)   Hunger Vital Sign    Worried About Running Out of Food in the Last Year: Sometimes true    Ran Out of Food in the Last Year: Sometimes true  Transportation Needs: Unmet Transportation Needs (09/26/2024)   PRAPARE - Transportation    Lack of Transportation (Medical): Yes    Lack of Transportation (Non-Medical): Yes  Physical Activity: Unknown (09/26/2024)   Exercise Vital Sign    Days of Exercise per Week: Patient declined    Minutes of Exercise per Session: Not on file  Stress: Stress Concern Present (09/26/2024)   [employer] of Occupational Health - Occupational Stress Questionnaire  Feeling of Stress: Very much  Social Connections: Moderately Isolated (09/26/2024)   Social Connection and Isolation Panel    Frequency of Communication with Friends and Family: More than three times a week    Frequency of Social Gatherings with Friends and Family: Once a week    Attends Religious Services: More than 4 times per year    Active Member of [employer] or Organizations: No    Attends Engineer, Structural: Not on file    Marital Status: Divorced    Review of Systems Per HPI  Objective:  BP 134/83 (BP Location: Left Arm, Patient Position: Sitting)   Pulse 61   Temp 98.1 F (36.7 C)    Ht 5' 5 (1.651 m)   Wt 260 lb 6 oz (118.1 kg)   LMP 08/11/2014   BMI 43.33 kg/m      10/16/2024    9:46 AM 09/29/2024   10:30 AM 09/23/2024    2:54 PM  BP/Weight  Systolic BP 134 149 136  Diastolic BP 83 93 88  Wt. (Lbs) 260.38 259 258  BMI 43.33 kg/m2 43.1 kg/m2 42.28 kg/m2    Physical Exam Vitals and nursing note reviewed.  Constitutional:      General: She is not in acute distress.    Appearance: Normal appearance. She is obese.  HENT:     Head: Normocephalic and atraumatic.     Nose:     Comments: Frontal sinus tenderness to palpation.    Mouth/Throat:     Pharynx: Oropharynx is clear.  Eyes:     General:        Right eye: No discharge.        Left eye: No discharge.     Conjunctiva/sclera: Conjunctivae normal.  Pulmonary:     Effort: Pulmonary effort is normal.     Breath sounds: Normal breath sounds. No wheezing or rales.  Musculoskeletal:     Cervical back: Neck supple.  Lymphadenopathy:     Cervical: No cervical adenopathy.  Neurological:     Mental Status: She is alert.     Lab Results  Component Value Date   WBC 8.2 09/29/2024   HGB 12.8 09/29/2024   HCT 38.6 09/29/2024   PLT 223 09/29/2024   GLUCOSE 90 09/29/2024   CHOL 161 09/29/2024   TRIG 102 09/29/2024   HDL 70 09/29/2024   LDLCALC 72 09/29/2024   ALT 14 09/29/2024   AST 19 09/29/2024   NA 138 09/29/2024   K 4.7 09/29/2024   CL 104 09/29/2024   CREATININE 0.77 09/29/2024   BUN 15 09/29/2024   CO2 27 09/29/2024   TSH 0.489 02/09/2015   HGBA1C 5.2 07/24/2024     Assessment & Plan:  Acute frontal sinusitis, recurrence not specified Assessment & Plan: Treating with Augmentin  and Tessalon  Perles.  Orders: -     Amoxicillin -Pot Clavulanate; Take 1 tablet by mouth 2 (two) times daily.  Dispense: 14 tablet; Refill: 0 -     Benzonatate ; Take 1 capsule (200 mg total) by mouth 3 (three) times daily as needed for cough.  Dispense: 30 capsule; Refill: 0    Follow-up:  Return if  symptoms worsen or fail to improve.  Jacqulyn Ahle DO [facility]

## 2024-10-18 ENCOUNTER — Other Ambulatory Visit (HOSPITAL_COMMUNITY): Payer: Self-pay

## 2024-10-22 ENCOUNTER — Ambulatory Visit (HOSPITAL_COMMUNITY)

## 2024-10-23 ENCOUNTER — Ambulatory Visit: Admitting: Internal Medicine

## 2024-10-27 ENCOUNTER — Other Ambulatory Visit (HOSPITAL_COMMUNITY): Payer: Self-pay

## 2024-10-28 ENCOUNTER — Encounter: Payer: Self-pay | Admitting: Gastroenterology

## 2024-11-03 ENCOUNTER — Other Ambulatory Visit: Payer: Self-pay

## 2024-11-04 ENCOUNTER — Ambulatory Visit (HOSPITAL_COMMUNITY)
Admission: RE | Admit: 2024-11-04 | Discharge: 2024-11-04 | Disposition: A | Discharge status: Home / Self Care | Source: Ambulatory Visit | Attending: Cardiology | Admitting: Cardiology

## 2024-11-04 DIAGNOSIS — R0609 Other forms of dyspnea: Secondary | ICD-10-CM | POA: Insufficient documentation

## 2024-11-04 DIAGNOSIS — R072 Precordial pain: Secondary | ICD-10-CM | POA: Diagnosis present

## 2024-11-04 MED ORDER — IOHEXOL 350 MG/ML SOLN
100.0000 mL | Freq: Once | INTRAVENOUS | Status: AC | PRN
  Administered 2024-11-04: 100 mL via INTRAVENOUS

## 2024-11-04 MED ORDER — NITROGLYCERIN 0.4 MG SL SUBL
0.8000 mg | SUBLINGUAL_TABLET | Freq: Once | SUBLINGUAL | Status: AC
  Administered 2024-11-04: 0.8 mg via SUBLINGUAL

## 2024-11-05 ENCOUNTER — Ambulatory Visit: Admitting: Cardiology

## 2024-11-06 ENCOUNTER — Ambulatory Visit: Payer: Self-pay | Admitting: Cardiology

## 2024-11-07 ENCOUNTER — Other Ambulatory Visit: Payer: Self-pay

## 2024-11-07 DIAGNOSIS — I83893 Varicose veins of bilateral lower extremities with other complications: Secondary | ICD-10-CM

## 2024-11-14 ENCOUNTER — Other Ambulatory Visit: Payer: Self-pay | Admitting: Infectious Diseases

## 2024-11-14 ENCOUNTER — Other Ambulatory Visit: Payer: Self-pay

## 2024-11-14 DIAGNOSIS — B20 Human immunodeficiency virus [HIV] disease: Secondary | ICD-10-CM

## 2024-11-17 ENCOUNTER — Other Ambulatory Visit: Payer: Self-pay

## 2024-11-17 ENCOUNTER — Ambulatory Visit: Admitting: Orthopedic Surgery

## 2024-11-17 ENCOUNTER — Other Ambulatory Visit (HOSPITAL_COMMUNITY): Payer: Self-pay

## 2024-11-17 MED ORDER — ROSUVASTATIN CALCIUM 10 MG PO TABS
10.0000 mg | ORAL_TABLET | Freq: Every day | ORAL | 5 refills | Status: DC
  Filled 2024-11-17: qty 30, 30d supply, fill #0

## 2024-11-21 ENCOUNTER — Other Ambulatory Visit (HOSPITAL_COMMUNITY): Payer: Self-pay

## 2024-11-22 ENCOUNTER — Other Ambulatory Visit (HOSPITAL_COMMUNITY): Payer: Self-pay

## 2024-11-25 ENCOUNTER — Other Ambulatory Visit: Payer: Self-pay

## 2024-11-25 NOTE — Progress Notes (Signed)
 Specialty Pharmacy Refill Coordination Note  Roy Normington is a 54 y.o. female contacted today regarding refills of specialty medication(s) Bictegravir-Emtricitab-Tenofov (Biktarvy )   Patient requested Delivery   Delivery date: 12/09/24   Verified address: [address] [place] [address]   Medication will be filled on: 12/08/24

## 2024-12-05 ENCOUNTER — Encounter: Payer: Self-pay | Admitting: Orthopedic Surgery

## 2024-12-05 ENCOUNTER — Ambulatory Visit: Admitting: Orthopedic Surgery

## 2024-12-05 DIAGNOSIS — Z01818 Encounter for other preprocedural examination: Secondary | ICD-10-CM | POA: Diagnosis not present

## 2024-12-05 DIAGNOSIS — S46011D Strain of muscle(s) and tendon(s) of the rotator cuff of right shoulder, subsequent encounter: Secondary | ICD-10-CM

## 2024-12-05 NOTE — Progress Notes (Signed)
" ° ° °  12/05/2024   Chief Complaint  Patient presents with   Shoulder Pain    Right     Encounter Diagnosis  Name Primary?   Traumatic incomplete tear of right rotator cuff, subsequent encounter Yes    What pharmacy do you use ? ________[facility] ___________________  DOI/DOS/ Date:    Did you get better, worse or no change (Answer below)   Worse      "

## 2024-12-05 NOTE — Progress Notes (Signed)
" ° °  Chief Complaint  Patient presents with   Shoulder Pain    Right     Shoulder Pain    This is a 54 year old female who had shoulder surgery over 10 years ago but fell while in prison and injured her right shoulder.  She did not want injections she tried ibuprofen  home exercise and physical therapy in a formal setting but did not improve.  Based on her physical findings of weakness and painful forward elevation she was sent for MRI.  In the interim she had Celebrex which also did not help her symptoms  Her MRI was done and she is back now today for follow-up recommendations  The MRI was reviewed and she does have a rotator cuff tear  I informed her about the rotator cuff tear.  I informed her that it would be nice to have the records related to her surgery but those are unavailable so I am not sure what we will get into once we get into the shoulder but if she is having significant pain which she says she is and is not being relieved by any other method which she says it is not then surgical repair is the next step  On exam she has good passive range of motion although painful.  She has weak abduction and flexion.  Her neurovascular exam is intact she has a small incision over the shoulder from the prior surgery which appears to be right arthroscopic  Again on MRI I see a complete rotator cuff tear  Plan is for right shoulder arthroscopy rotator cuff repair with possible mini open incision.  Patient is allergic to codeine  Patient is advised to stop smoking  Encounter Diagnoses  Name Primary?   Traumatic incomplete tear of right rotator cuff, subsequent encounter Yes   Pre-op exam       "

## 2024-12-05 NOTE — Patient Instructions (Signed)
 Your surgery will be at [facility] by Dr Margrette  The hospital will contact you with a preoperative appointment to discuss Anesthesia.  Please arrive on time or 15 minutes early for the preoperative appointment, they have a very tight schedule if you are late or do not come in your surgery will be cancelled.  The phone number is [phone]. Please bring your medications with you for the appointment. They will tell you the arrival time and medication instructions when you have your preoperative evaluation. Do not wear nail polish the day of your surgery and if you take Phentermine you need to stop this medication ONE WEEK prior to your surgery. If you take Invokana, Farxiga, Jardiance, or Steglatro) - Hold 72 hours before the procedure.  If you take Ozempic,  Mounjaro, Bydureon or Trulicity do not take for 8 days before your surgery. If you take Victoza, Rybelsis, Saxenda or Adlyxi stop 24 hours before the procedure.  Please arrive at the hospital 2 hours before procedure if scheduled at 9:30 or later in the day or at the time the nurse tells you at your preoperative visit.   If you have my chart do not use the time given in my chart use the time given to you by the nurse during your preoperative visit.   Your surgery  time may change. Please be available for phone calls the day of your surgery and the day before. The Short Stay department may need to discuss changes about your surgery time. Not reaching the you could lead to procedure delays and possible cancellation.  You must have a ride home and someone to stay with you for 24 to 48 hours. The person taking you home will receive and sign for the your discharge instructions.  Please be prepared to give your support person's name and telephone number to Central Registration. Dr Margrette will need that name and phone number post procedure.

## 2024-12-06 ENCOUNTER — Other Ambulatory Visit (HOSPITAL_COMMUNITY): Payer: Self-pay

## 2024-12-08 ENCOUNTER — Other Ambulatory Visit: Payer: Self-pay

## 2024-12-09 ENCOUNTER — Telehealth: Payer: Self-pay | Admitting: Orthopedic Surgery

## 2024-12-09 NOTE — Telephone Encounter (Signed)
 Dr. Areatha pt - pt lvm requesting to rs her surgery to the week of the 20th because she has a lot going on the week of the 27th.  [phone]

## 2024-12-09 NOTE — Telephone Encounter (Signed)
 I called her, can move to Feb 20th not available now.  She states she can keep on 27th then

## 2024-12-10 NOTE — Progress Notes (Unsigned)
 "  Patient ID: Rachel Underwood, female   DOB: November 23, 1971, 54 y.o.   MRN: [id number]  Reason for Consult: No chief complaint on file.   Referred by Michele Richardson, DO  Subjective:     HPI  Omer Broxton is a 54 y.o. female who presents for evaluation of lower extremity *** Timeframe: *** Symptoms: *** Varicosities: *** Previous wounds: *** Previous DVT: *** In compression: ***  Past Medical History:  Diagnosis Date   Abnormal Pap smear of cervix 2005(EST)   ckc for abnl pap- [facility] [place]   Allergy    Asthma    Bipolar 1 disorder (HCC)    Borderline personality disorder (HCC)    BPD (bronchopulmonary dysplasia) (HCC)    Carpal tunnel syndrome    bilateral wrist   Depression    Dysmenorrhea 06/03/2014   Enlarged uterus 06/03/2014   Fibromyalgia    GERD (gastroesophageal reflux disease)    Herpes simplex without mention of complication    HIV (human immunodeficiency virus infection) (HCC)    Hyperlipidemia    Hypertension    Menorrhagia with irregular cycle 06/03/2014   Osteoarthritis    Seizures (HCC)    had as a teenager, unknown etiology and no meds.   Family History  Problem Relation Age of Onset   Fibroids Mother    Hypertension Mother    Depression Mother    Irritable bowel syndrome Mother    Arthritis Mother    Heart disease Mother    Mental illness Mother    Stroke Mother    Vision loss Mother    Lupus Mother    Colon polyps Mother    Hypertension Father    Mental illness Father    Arthritis Father    Heart disease Father    Vision loss Father    Colon polyps Father    Hypertension Brother    Early death Brother    Learning disabilities Brother    Mental illness Brother    Heart disease Maternal Grandmother    Cancer Maternal Grandmother        breast   Depression Maternal Grandmother    Hyperlipidemia Maternal Grandmother    Hypertension Maternal Grandmother    Heart disease Maternal Grandfather    Alzheimer's disease Maternal  Grandfather    Hyperlipidemia Maternal Grandfather    Hypertension Maternal Grandfather    Dementia Paternal Grandmother    Hyperlipidemia Paternal Grandmother    Hypertension Paternal Grandmother    Cancer Paternal Grandfather        prostate   Heart disease Paternal Grandfather    Hyperlipidemia Paternal Grandfather    Hypertension Paternal Grandfather    Bipolar disorder Daughter    Schizophrenia Daughter    Lupus Daughter    Anxiety disorder Daughter    ADD / ADHD Son    Colon cancer Neg Hx    Past Surgical History:  Procedure Laterality Date   ABDOMINAL HYSTERECTOMY N/A 09/01/2014   Procedure: HYSTERECTOMY ABDOMINAL;  Surgeon: Norleen Edsel GAILS, MD;  Location: [facility];  Service: Gynecology;  Laterality: N/A;   ANKLE SURGERY Right    repair of ligaments   BACK SURGERY     BILATERAL SALPINGECTOMY Bilateral 09/01/2014   Procedure: BILATERAL SALPINGECTOMY;  Surgeon: Norleen Edsel GAILS, MD;  Location: [facility];  Service: Gynecology;  Laterality: Bilateral;   CESAREAN SECTION     ckc     COLONOSCOPY N/A 12/23/2014   MFM:jwjo canal hemorrhoids left-sided diverticula   ESOPHAGOGASTRODUODENOSCOPY N/A 12/23/2014  MFM:[id number] reflux/noncritical shatzkis ring s/p dilation/small HH   EXCISION OF SKIN TAG N/A 09/01/2014   Procedure: EXCISION OF SKIN NEVUS;  Surgeon: Norleen Edsel GAILS, MD;  Location: [facility];  Service: Gynecology;  Laterality: N/A;   FRACTURE SURGERY     MALONEY DILATION N/A 12/23/2014   Procedure: AGAPITO DILATION;  Surgeon: Lamar CHRISTELLA Hollingshead, MD;  Location: [facility];  Service: Endoscopy;  Laterality: N/A;   SAVORY DILATION N/A 12/23/2014   Procedure: SAVORY DILATION;  Surgeon: Lamar CHRISTELLA Hollingshead, MD;  Location: [facility];  Service: Endoscopy;  Laterality: N/A;   SCAR REVISION N/A 09/01/2014   Procedure: EXCISION OF CICATRIX;  Surgeon: Norleen Edsel GAILS, MD;  Location: [facility];  Service: Gynecology;  Laterality: N/A;   SHOULDER SURGERY Right    rotator cuff   SPINE SURGERY       Short Social History:  Social History   Tobacco Use   Smoking status: Every Day    Current packs/day: 0.50    Average packs/day: 0.5 packs/day for 0.5 years (0.3 ttl pk-yrs)    Types: E-cigarettes, Cigarettes    Start date: 06/2024   Smokeless tobacco: Never   Tobacco comments:    1 PPD  Substance Use Topics   Alcohol use: Not Currently    Allergies[1]  Current Outpatient Medications  Medication Sig Dispense Refill   acyclovir  (ZOVIRAX ) 200 MG capsule Take 2 capsules (400 mg total) by mouth 2 (two) times daily. 120 capsule 11   albuterol  (VENTOLIN  HFA) 108 (90 Base) MCG/ACT inhaler INHALE 2 PUFFS INTO THE LUNGS EVERY 4 HOURS AS NEEDED FOR WHEEZING OR SHORTNESS OF BREATH. 18 g 2   amLODipine  (NORVASC ) 10 MG tablet Take 1 tablet (10 mg total) by mouth daily. 90 tablet 3   benzonatate  (TESSALON ) 200 MG capsule Take 1 capsule (200 mg total) by mouth 3 (three) times daily as needed for cough. 30 capsule 0   bictegravir-emtricitabine -tenofovir  AF (BIKTARVY ) 50-200-25 MG TABS tablet Take 1 tablet by mouth daily. 30 tablet 5   cariprazine  (VRAYLAR ) 3 MG capsule Take 1 capsule (3 mg total) by mouth every evening. 30 capsule 0   cromolyn  (OPTICROM ) 4 % ophthalmic solution Place 1 drop into both eyes daily. 10 mL 3   cycloSPORINE  (RESTASIS ) 0.05 % ophthalmic emulsion Place 1 drop into both eyes 2 (two) times daily. 180 each 2   EPINEPHrine  (EPIPEN  2-PAK) 0.3 mg/0.3 mL IJ SOAJ injection Inject 1 pen (0.3 mg) into the skin as directed as needed for anaphylaxis. 2 each 1   hydrocortisone  (ANUSOL -HC) 2.5 % rectal cream Place 1 Application rectally 2 (two) times daily. 30 g 0   hydrOXYzine  (ATARAX ) 50 MG tablet Take 1 tablet (50 mg total) by mouth up to 3 (three) times daily as needed for anxiety. 90 tablet 1   ibuprofen  (ADVIL ) 800 MG tablet Take 1 tablet (800 mg total) by mouth every 8 (eight) hours as needed. 30 tablet 0   lamoTRIgine  (LAMICTAL ) 200 MG tablet Take 1 tablet (200 mg total) by  mouth daily. 30 tablet 4   linaclotide  (LINZESS ) 145 MCG CAPS capsule Take 1 capsule (145 mcg total) by mouth daily before breakfast.     mirabegron  ER (MYRBETRIQ ) 25 MG TB24 tablet Take 1 tablet (25 mg total) by mouth daily. 30 tablet 11   ondansetron  (ZOFRAN -ODT) 4 MG disintegrating tablet Dissolve 1 tablet (4 mg total) by mouth 3 (three) times daily as needed. 20 tablet 3   pantoprazole  (PROTONIX ) 40 MG tablet Take  1 tablet (40 mg total) by mouth 2 (two) times daily before a meal. 60 tablet 3   propranolol  (INDERAL ) 20 MG tablet Take 1 tablet (20 mg total) by mouth 2 (two) times daily. 180 tablet 3   QUEtiapine  (SEROQUEL ) 100 MG tablet Take 1 tablet (100 mg total) by mouth 3 (three) times daily. 90 tablet 4   rosuvastatin  (CRESTOR ) 10 MG tablet Take 1 tablet (10 mg total) by mouth daily. 30 tablet 5   No current facility-administered medications for this visit.    REVIEW OF SYSTEMS All other systems were reviewed and are negative    Objective:  Objective   There were no vitals filed for this visit. There is no height or weight on file to calculate BMI.  Physical Exam General: no acute distress Cardiac: hemodynamically stable Extremities: *** Vascular:   Right: palpable DP, PT***  Left: palpable DP, PT***   Data: Reflux study ***      Assessment/Plan:     Astoria Teodoro is a 54 y.o. female with chronic venous insufficiency with C*** disease and reflux noted in *** I explained the foundation of CVI treatment of compression and elevation I recommended medical grade graduated compression stockings and intermittent leg elevation We also discussed that many patients find symptom improvement with exercise and if they have access to a pool should attempt water  aerobics.  Plan to follow up in 3 months with Dr. Serene or Dr. Sheree with a *** reflux study in order to complete bilateral imaging      Norman GORMAN Serve MD [facility]    [1]   Allergies Allergen Reactions   Flecainide  Swelling   Morphine Shortness Of Breath    Reports it caused her to pass out and have difficulty breathing   Piroxicam Anaphylaxis   Abilify [Aripiprazole]    Citalopram Hives   Codeine Nausea And Vomiting   Depakote [Divalproex Sodium] Swelling   Gabapentin     Hives    Haldol [Haloperidol Lactate] Other (See Comments)    Slurs speech and stumble   Haloperidol    Indomethacin Swelling    Dizzy, tigling of face   Invega [Paliperidone] Other (See Comments)    Slurred speech, drooling   Latuda  [Lurasidone  Hcl] Itching   Lithium Hives   Lurasidone     Promethazine  Swelling    Arms and hands   Trazodone And Nefazodone Other (See Comments)    Dry mouth, congestion, severe dryness.   Lexapro [Escitalopram Oxalate] Rash   "

## 2024-12-11 ENCOUNTER — Ambulatory Visit: Admitting: Cardiology

## 2024-12-12 ENCOUNTER — Ambulatory Visit (HOSPITAL_COMMUNITY)

## 2024-12-12 ENCOUNTER — Ambulatory Visit (HOSPITAL_COMMUNITY): Admitting: Vascular Surgery

## 2024-12-13 ENCOUNTER — Other Ambulatory Visit (HOSPITAL_COMMUNITY): Payer: Self-pay

## 2024-12-15 ENCOUNTER — Other Ambulatory Visit: Payer: Self-pay

## 2024-12-16 ENCOUNTER — Other Ambulatory Visit: Payer: Self-pay

## 2024-12-16 DIAGNOSIS — B20 Human immunodeficiency virus [HIV] disease: Secondary | ICD-10-CM

## 2024-12-16 MED ORDER — ENSURE PO LIQD: 237.0000 mL | Freq: Two times a day (BID) | ORAL | 6 refills | Status: DC

## 2024-12-16 MED ORDER — ENSURE PO LIQD: 237.0000 mL | Freq: Two times a day (BID) | ORAL | 6 refills | Status: AC

## 2024-12-16 NOTE — Progress Notes (Signed)
 Ensure Rx faxed to Jori Baptist with [facility].   Legacy Lacivita, BSN, RN

## 2024-12-19 ENCOUNTER — Other Ambulatory Visit: Payer: Self-pay

## 2024-12-19 ENCOUNTER — Encounter: Payer: Self-pay | Admitting: Cardiology

## 2024-12-19 ENCOUNTER — Ambulatory Visit: Attending: Cardiology | Admitting: Cardiology

## 2024-12-19 VITALS — BP 130/78 | HR 65 | Resp 16 | Ht 65.0 in | Wt 274.4 lb

## 2024-12-19 DIAGNOSIS — I10 Essential (primary) hypertension: Secondary | ICD-10-CM | POA: Diagnosis present

## 2024-12-19 DIAGNOSIS — I471 Supraventricular tachycardia, unspecified: Secondary | ICD-10-CM | POA: Insufficient documentation

## 2024-12-19 DIAGNOSIS — F17210 Nicotine dependence, cigarettes, uncomplicated: Secondary | ICD-10-CM | POA: Insufficient documentation

## 2024-12-19 DIAGNOSIS — R0602 Shortness of breath: Secondary | ICD-10-CM | POA: Diagnosis present

## 2024-12-19 DIAGNOSIS — R072 Precordial pain: Secondary | ICD-10-CM

## 2024-12-19 DIAGNOSIS — Z01810 Encounter for preprocedural cardiovascular examination: Secondary | ICD-10-CM | POA: Diagnosis present

## 2024-12-19 DIAGNOSIS — I2584 Coronary atherosclerosis due to calcified coronary lesion: Secondary | ICD-10-CM | POA: Insufficient documentation

## 2024-12-19 DIAGNOSIS — I2510 Atherosclerotic heart disease of native coronary artery without angina pectoris: Secondary | ICD-10-CM | POA: Insufficient documentation

## 2024-12-19 DIAGNOSIS — I83813 Varicose veins of bilateral lower extremities with pain: Secondary | ICD-10-CM

## 2024-12-19 MED ORDER — ROSUVASTATIN CALCIUM 20 MG PO TABS
20.0000 mg | ORAL_TABLET | Freq: Every day | ORAL | 3 refills | Status: AC
  Filled 2024-12-19: qty 90, 90d supply, fill #0

## 2024-12-19 NOTE — Patient Instructions (Addendum)
 Medication Instructions:  INCREASE  Rosuvastatin  (Crestor ) to 20 mg. Take one (1) tablet by mouth once daily.   *If you need a refill on your cardiac medications before your next appointment, please call your pharmacy*  Lab Work: Fasting Lipid and CMP in 6 weeks If you have labs (blood work) drawn today and your tests are completely normal, you will receive your results only by: MyChart Message (if you have MyChart) OR A paper copy in the mail If you have any lab test that is abnormal or we need to change your treatment, we will call you to review the results.  Testing/Procedures: None ordered  Follow-Up: At [facility], you and your health needs are our priority.  As part of our continuing mission to provide you with exceptional heart care, our providers are all part of one team.  This team includes your primary Cardiologist (physician) and Advanced Practice Providers or APPs (Physician Assistants and Nurse Practitioners) who all work together to provide you with the care you need, when you need it.  Your next appointment:   6 month(s)  Provider:   Madonna Large, DO    We recommend signing up for the patient portal called MyChart.  Sign up information is provided on this After Visit Summary.  MyChart is used to connect with patients for Virtual Visits (Telemedicine).  Patients are able to view lab/test results, encounter notes, upcoming appointments, etc.  Non-urgent messages can be sent to your provider as well.   To learn more about what you can do with MyChart, go to [email or web address].

## 2024-12-19 NOTE — Progress Notes (Signed)
 "   Cardiology Office Note:    NAME:  Rachel Underwood    MRN: [id number] DOB:  02-25-1971   PCP:  Edman Meade PEDLAR, FNP  Former Cardiology Providers: Pearla Rout, MD Primary Cardiologist:  Madonna Large, DO, [facility] (established care 08/13/2024) Electrophysiologist:  Danelle Birmingham, MD   Chief Complaint  Patient presents with   Follow-up    Reevaluation of chest pain and discuss test results    History of Present Illness:    Rachel Underwood is a 54 y.o. African-American female whose past medical history and cardiovascular risk factors includes: Supraventricular tachycardia, hypertension,Carpal tunnel syndrome, bipolar disorder, HIV, HSV, Lupus (per patient), hx of polysubstance abuse, cigarette smoking.   Patient was referred to the practice in September 2025 for shortness of breath and leg swelling.  Please refer to the initial consult note for additional details but in summary her symptoms were suggestive of possible cardiac etiology but she also started experiencing symptoms around the same time when she ran out of her blood pressure pills.  Her EKG was nonischemic.  Echocardiogram was pending and stress echocardiogram was ordered.  Echocardiogram noted preserved LVEF, normal diastolic function, no significant valvular heart disease.  Stress echocardiogram was inconclusive.  And therefore the shared decision was to proceed with coronary CTA.  Patient is noted to have severe CAC and mild nonobstructive coronary artery disease.  I had refilled her antihypertensive medications until she has a chance to follow-up with PCP.  I also advised her to start checking her blood pressures at home to see if further medication titration is warranted.  With regards to bilateral lower extremity swelling and varicosities she wanted to be referred to vascular for further weight vein workup.  Consult was provided.  She presents today for follow-up  Patient denies anginal chest pain or heart failure  symptoms. Patient is compliant with medical therapy. Patient is scheduled to have shoulder surgery on December 30, 2024.  Current Medications: Current Meds  Medication Sig   acyclovir  (ZOVIRAX ) 200 MG capsule Take 2 capsules (400 mg total) by mouth 2 (two) times daily.   albuterol  (VENTOLIN  HFA) 108 (90 Base) MCG/ACT inhaler INHALE 2 PUFFS INTO THE LUNGS EVERY 4 HOURS AS NEEDED FOR WHEEZING OR SHORTNESS OF BREATH.   amLODipine  (NORVASC ) 10 MG tablet Take 1 tablet (10 mg total) by mouth daily.   bictegravir-emtricitabine -tenofovir  AF (BIKTARVY ) 50-200-25 MG TABS tablet Take 1 tablet by mouth daily. (Patient taking differently: Take 1 tablet by mouth at bedtime.)   cromolyn  (OPTICROM ) 4 % ophthalmic solution Place 1 drop into both eyes daily.   cycloSPORINE  (RESTASIS ) 0.05 % ophthalmic emulsion Place 1 drop into both eyes 2 (two) times daily.   Ensure (ENSURE) Take 237 mLs by mouth 2 (two) times daily between meals. (Patient not taking: Reported on 12/22/2024)   EPINEPHrine  (EPIPEN  2-PAK) 0.3 mg/0.3 mL IJ SOAJ injection Inject 1 pen (0.3 mg) into the skin as directed as needed for anaphylaxis.   hydrocortisone  (ANUSOL -HC) 2.5 % rectal cream Place 1 Application rectally 2 (two) times daily. (Patient taking differently: Place 1 Application rectally daily as needed for hemorrhoids.)   hydrOXYzine  (ATARAX ) 50 MG tablet Take 1 tablet (50 mg total) by mouth up to 3 (three) times daily as needed for anxiety. (Patient taking differently: Take 50 mg by mouth daily as needed for anxiety.)   lamoTRIgine  (LAMICTAL ) 200 MG tablet Take 1 tablet (200 mg total) by mouth daily.   mirabegron  ER (MYRBETRIQ ) 25 MG TB24 tablet Take 1 tablet (  25 mg total) by mouth daily.   ondansetron  (ZOFRAN -ODT) 4 MG disintegrating tablet Dissolve 1 tablet (4 mg total) by mouth 3 (three) times daily as needed.   propranolol  (INDERAL ) 20 MG tablet Take 1 tablet (20 mg total) by mouth 2 (two) times daily.   QUEtiapine  (SEROQUEL ) 100 MG  tablet Take 1 tablet (100 mg total) by mouth 3 (three) times daily. (Patient taking differently: Take 200 mg by mouth at bedtime.)   rosuvastatin  (CRESTOR ) 20 MG tablet Take 1 tablet (20 mg total) by mouth daily.   [DISCONTINUED] benzonatate  (TESSALON ) 200 MG capsule Take 1 capsule (200 mg total) by mouth 3 (three) times daily as needed for cough.   [DISCONTINUED] cariprazine  (VRAYLAR ) 3 MG capsule Take 1 capsule (3 mg total) by mouth every evening.   [DISCONTINUED] ibuprofen  (ADVIL ) 800 MG tablet Take 1 tablet (800 mg total) by mouth every 8 (eight) hours as needed.   [DISCONTINUED] linaclotide  (LINZESS ) 145 MCG CAPS capsule Take 1 capsule (145 mcg total) by mouth daily before breakfast. (Patient not taking: Reported on 12/22/2024)   [DISCONTINUED] pantoprazole  (PROTONIX ) 40 MG tablet Take 1 tablet (40 mg total) by mouth 2 (two) times daily before a meal.   [DISCONTINUED] rosuvastatin  (CRESTOR ) 10 MG tablet Take 1 tablet (10 mg total) by mouth daily.     Allergies:    Flecainide , Morphine, Piroxicam, Abilify [aripiprazole], Citalopram, Codeine, Depakote [divalproex sodium], Gabapentin, Haldol [haloperidol lactate], Haloperidol, Indomethacin, Invega [paliperidone], Latuda  [lurasidone  hcl], Lithium, Lurasidone , Promethazine , Trazodone and nefazodone, and Lexapro [escitalopram oxalate]   Past Medical History: Past Medical History:  Diagnosis Date   Abnormal Pap smear of cervix 2005(EST)   ckc for abnl pap- [facility] [place]   Allergy    Asthma    Bipolar 1 disorder (HCC)    Borderline personality disorder (HCC)    BPD (bronchopulmonary dysplasia) (HCC)    Carpal tunnel syndrome    bilateral wrist   Depression    Dysmenorrhea 06/03/2014   Enlarged uterus 06/03/2014   Fibromyalgia    GERD (gastroesophageal reflux disease)    Herpes simplex without mention of complication    HIV (human immunodeficiency virus infection) (HCC)    Hyperlipidemia    Hypertension    Menorrhagia  with irregular cycle 06/03/2014   Osteoarthritis    Palpitations    Seizures (HCC)    had as a teenager, unknown etiology and no meds.    Past Surgical History: Past Surgical History:  Procedure Laterality Date   ABDOMINAL HYSTERECTOMY N/A 09/01/2014   Procedure: HYSTERECTOMY ABDOMINAL;  Surgeon: Norleen Edsel GAILS, MD;  Location: [facility];  Service: Gynecology;  Laterality: N/A;   ANKLE SURGERY Right    repair of ligaments   BACK SURGERY     BILATERAL SALPINGECTOMY Bilateral 09/01/2014   Procedure: BILATERAL SALPINGECTOMY;  Surgeon: Norleen Edsel GAILS, MD;  Location: [facility];  Service: Gynecology;  Laterality: Bilateral;   CESAREAN SECTION     ckc     COLONOSCOPY N/A 12/23/2014   MFM:jwjo canal hemorrhoids left-sided diverticula   ESOPHAGOGASTRODUODENOSCOPY N/A 12/23/2014   MFM:zmndpcz reflux/noncritical shatzkis ring s/p dilation/small HH   EXCISION OF SKIN TAG N/A 09/01/2014   Procedure: EXCISION OF SKIN NEVUS;  Surgeon: Norleen Edsel GAILS, MD;  Location: [facility];  Service: Gynecology;  Laterality: N/A;   FRACTURE SURGERY     MALONEY DILATION N/A 12/23/2014   Procedure: AGAPITO DILATION;  Surgeon: Lamar CHRISTELLA Hollingshead, MD;  Location: [facility];  Service: Endoscopy;  Laterality: N/A;  SAVORY DILATION N/A 12/23/2014   Procedure: SAVORY DILATION;  Surgeon: Lamar CHRISTELLA Hollingshead, MD;  Location: [facility];  Service: Endoscopy;  Laterality: N/A;   SCAR REVISION N/A 09/01/2014   Procedure: EXCISION OF CICATRIX;  Surgeon: Norleen Edsel GAILS, MD;  Location: [facility];  Service: Gynecology;  Laterality: N/A;   SHOULDER SURGERY Right    rotator cuff   SPINE SURGERY      Social History: Social History   Tobacco Use   Smoking status: Every Day    Current packs/day: 0.50    Average packs/day: 0.5 packs/day for 0.6 years (0.3 ttl pk-yrs)    Types: E-cigarettes, Cigarettes    Start date: 06/2024   Smokeless tobacco: Never   Tobacco comments:    1 PPD  Vaping Use   Vaping status: Some Days  Substance Use  Topics   Alcohol use: Not Currently   Drug use: No    Types: Crack cocaine    Comment: last cocaine use July 2024    Family History: Family History  Problem Relation Age of Onset   Fibroids Mother    Hypertension Mother    Depression Mother    Irritable bowel syndrome Mother    Arthritis Mother    Heart disease Mother    Mental illness Mother    Stroke Mother    Vision loss Mother    Lupus Mother    Colon polyps Mother    Hypertension Father    Mental illness Father    Arthritis Father    Heart disease Father    Vision loss Father    Colon polyps Father    Hypertension Brother    Early death Brother    Learning disabilities Brother    Mental illness Brother    Heart disease Maternal Grandmother    Cancer Maternal Grandmother        breast   Depression Maternal Grandmother    Hyperlipidemia Maternal Grandmother    Hypertension Maternal Grandmother    Heart disease Maternal Grandfather    Alzheimer's disease Maternal Grandfather    Hyperlipidemia Maternal Grandfather    Hypertension Maternal Grandfather    Dementia Paternal Grandmother    Hyperlipidemia Paternal Grandmother    Hypertension Paternal Grandmother    Cancer Paternal Grandfather        prostate   Heart disease Paternal Grandfather    Hyperlipidemia Paternal Grandfather    Hypertension Paternal Grandfather    Bipolar disorder Daughter    Schizophrenia Daughter    Lupus Daughter    Anxiety disorder Daughter    ADD / ADHD Son    Colon cancer Neg Hx      ROS:   Review of Systems  Cardiovascular:  Positive for chest pain (see HPI) and dyspnea on exertion (see HPI). Negative for claudication, irregular heartbeat, leg swelling, near-syncope, orthopnea, palpitations, paroxysmal nocturnal dyspnea and syncope.  Respiratory:  Negative for shortness of breath.   Hematologic/Lymphatic: Negative for bleeding problem.    EKGs/Labs/Other Studies Reviewed:   EKG: August 11, 2024: Sinus rhythm, 62 bpm,  without ectopy  Echocardiogram: 08/18/2024 1. Left ventricular ejection fraction, by estimation, is 60 to 65%. The left ventricle has normal function. The left ventricle has no regional wall motion abnormalities. Left ventricular diastolic parameters were normal. The average left ventricular  global longitudinal strain is -25.0 %. The global longitudinal strain is normal. 2. Right ventricular systolic function is normal. The right ventricular size is normal. There is normal pulmonary artery systolic pressure. 3.  The mitral valve is normal in structure. Trivial mitral valve regurgitation. No evidence of mitral stenosis. 4. The aortic valve is grossly normal. Aortic valve regurgitation is not visualized. No aortic stenosis is present. 5. Aortic dilatation noted. There is borderline dilatation of the ascending aorta, measuring 38 mm. 6. The inferior vena cava is normal in size with greater than 50% respiratory variability, suggesting right atrial pressure of 3 mmHg.   Stress Testing:  Stress echo. 09/18/2024 1. This is an inconclusive stress echocardiogram for ischemia. 2. This is an indeterminate risk study. 3. Duke treadmill score of +2, intermediate risk given limited exercise tolerance 4. Target heart rate was not achieved, unable to comment on ischemia. Normal function with corresponding increase in contractility with stress   CCTA  11/04/2024 1. Coronary calcium  score of 761. This was 99th percentile for age-, sex, and race-matched controls. 2. Normal coronary origin with right dominance. 3. Nonobstructive CAD 4. Mild (25-49%) stenosis in proximal to mid LAD, distal LAD, OM1, OM3, and proximal RCA  5. Minimal (0-24%) stenosis in proximal/mid LCX, OM2, and mid/distal RCA 6. Radiology Overread: Moderate-sized hiatal hernia.  RECOMMENDATIONS: CAD-RADS 2: Mild non-obstructive CAD (25-49%). Consider non-atherosclerotic causes of chest pain. Consider preventive therapy and risk factor  modification.   Labs:    Latest Ref Rng & Units 12/26/2024   10:53 AM 09/29/2024   10:15 AM 03/26/2023   10:16 AM  CBC  WBC 4.0 - 10.5 K/uL 7.6  8.2  6.3   Hemoglobin 12.0 - 15.0 g/dL 87.4  87.1  87.6   Hematocrit 36.0 - 46.0 % 37.0  38.6  36.1   Platelets 150 - 400 K/uL 194  223  184        Latest Ref Rng & Units 12/26/2024   10:53 AM 09/29/2024   10:15 AM 03/26/2023   10:16 AM  BMP  Glucose 70 - 99 mg/dL 898  90  90   BUN 6 - 20 mg/dL 14  15  8    Creatinine 0.44 - 1.00 mg/dL 9.20  9.22  9.30   BUN/Creat Ratio 6 - 22 (calc)  SEE NOTE:  SEE NOTE:   Sodium 135 - 145 mmol/L 139  138  141   Potassium 3.5 - 5.1 mmol/L 4.6  4.7  4.1   Chloride 98 - 111 mmol/L 104  104  106   CO2 22 - 32 mmol/L 25  27  30    Calcium  8.9 - 10.3 mg/dL 9.3  9.4  9.1       Latest Ref Rng & Units 12/26/2024   10:53 AM 09/29/2024   10:15 AM 03/26/2023   10:16 AM  CMP  Glucose 70 - 99 mg/dL 898  90  90   BUN 6 - 20 mg/dL 14  15  8    Creatinine 0.44 - 1.00 mg/dL 9.20  9.22  9.30   Sodium 135 - 145 mmol/L 139  138  141   Potassium 3.5 - 5.1 mmol/L 4.6  4.7  4.1   Chloride 98 - 111 mmol/L 104  104  106   CO2 22 - 32 mmol/L 25  27  30    Calcium  8.9 - 10.3 mg/dL 9.3  9.4  9.1   Total Protein 6.1 - 8.1 g/dL  6.9  6.4   Total Bilirubin 0.2 - 1.2 mg/dL  0.4  0.4   AST 10 - 35 U/L  19  11   ALT 6 - 29 U/L  14  7  Lab Results  Component Value Date   CHOL 161 09/29/2024   HDL 70 09/29/2024   LDLCALC 72 09/29/2024   TRIG 102 09/29/2024   CHOLHDL 2.3 09/29/2024   No results for input(s): LIPOA in the last 8760 hours. No components found for: NTPROBNP No results for input(s): PROBNP in the last 8760 hours. No results for input(s): TSH in the last 8760 hours.  Physical Exam:    Today's Vitals   12/19/24 0817  BP: 130/78  Pulse: 65  Resp: 16  SpO2: 95%  Weight: 274 lb 6.4 oz (124.5 kg)  Height: 5' 5 (1.651 m)   Body mass index is 45.66 kg/m. Wt Readings from Last 3 Encounters:   12/26/24 278 lb 9.6 oz (126.4 kg)  12/24/24 278 lb 9.6 oz (126.4 kg)  12/19/24 274 lb 6.4 oz (124.5 kg)    Physical Exam  Constitutional: No distress.  hemodynamically stable  Neck: No JVD present.  Cardiovascular: Normal rate, regular rhythm, S1 normal and S2 normal. Exam reveals no gallop, no S3 and no S4.  No murmur heard. Pulmonary/Chest: Effort normal and breath sounds normal. No stridor. She has no wheezes. She has no rales.  Musculoskeletal:        General: Edema (trace) present.     Cervical back: Neck supple.     Comments: Varicose veins - bilateral, painful at times.   Skin: Skin is warm.     Impression & Recommendation(s):  Impression: 1. Coronary atherosclerosis due to calcified coronary lesion   2. Preop cardiovascular exam   3. SOB (shortness of breath)   4. PSVT (paroxysmal supraventricular tachycardia)   5. Primary hypertension   6. Cigarette smoker     Recommendation(s):  Coronary atherosclerosis due to calcified coronary lesion Denies anginal chest pain. Echo September 2025: LVEF is preserved, normal diastolic function, no significant valvular heart disease Coronary CTA December 2025: Total coronary calcium  score 761, 99th percentile, nonobstructive coronary artery disease Reemphasized importance of improving her modifiable cardiovascular risk factors with focus on complete smoking cessation and lipid management Increase Crestor  from 10 mg p.o. daily to 20 mg p.o. daily. Fasting lipids in 6 weeks to reevaluate therapy and CMP. Start aspirin 81 mg p.o. daily recommended starting postsurgery when clinically safe.  Preop cardiovascular examination. Patient is being considered for rotator cuff repair with Dr. Margrette on 12/30/2024 Patient is considered to be acceptable risk for upcoming noncardiac surgery She has undergone appropriate testing as described above  SOB (shortness of breath) Has undergone appropriate cardiovascular workup as outlined  above. Recommend she follows up with PCP for additional noncardiac workup of shortness of breath.  May benefit from pulmonary evaluation given her history of smoking.  PSVT (paroxysmal supraventricular tachycardia) Stable. Continue propranolol  20 mg p.o. twice daily  Primary hypertension Chronic and stable. Blood pressure is acceptable. Currently on propranolol  and amlodipine  10 mg p.o. daily  Cigarette smoker Tobacco cessation counseling: Currently smoking 5 cigarettes/day   She is informed of the dangers of tobacco abuse including stroke, cancer, and MI, as well as benefits of tobacco cessation. She is willing to quit at this time. 6 mins were spent counseling patient cessation techniques. We discussed various methods to help quit smoking, including deciding on a date to quit, joining a support group, pharmacological agents- nicotine gum/patch/lozenges.  I will reassess her progress at the next follow-up visit   Orders Placed:  Orders Placed This Encounter  Procedures   Lipid Profile    Standing Status:   Future  Number of Occurrences:   1    Expected Date:   01/30/2025    Expiration Date:   12/19/2025   Comprehensive metabolic panel    Standing Status:   Future    Number of Occurrences:   1    Expected Date:   01/30/2025    Expiration Date:   12/27/2025     Final Medication List:    Meds ordered this encounter  Medications   rosuvastatin  (CRESTOR ) 20 MG tablet    Sig: Take 1 tablet (20 mg total) by mouth daily.    Dispense:  90 tablet    Refill:  3    Medications Discontinued During This Encounter  Medication Reason   rosuvastatin  (CRESTOR ) 10 MG tablet Dose change      Current Outpatient Medications:    acyclovir  (ZOVIRAX ) 200 MG capsule, Take 2 capsules (400 mg total) by mouth 2 (two) times daily., Disp: 120 capsule, Rfl: 11   albuterol  (VENTOLIN  HFA) 108 (90 Base) MCG/ACT inhaler, INHALE 2 PUFFS INTO THE LUNGS EVERY 4 HOURS AS NEEDED FOR WHEEZING OR SHORTNESS  OF BREATH., Disp: 18 g, Rfl: 2   amLODipine  (NORVASC ) 10 MG tablet, Take 1 tablet (10 mg total) by mouth daily., Disp: 90 tablet, Rfl: 3   bictegravir-emtricitabine -tenofovir  AF (BIKTARVY ) 50-200-25 MG TABS tablet, Take 1 tablet by mouth daily. (Patient taking differently: Take 1 tablet by mouth at bedtime.), Disp: 30 tablet, Rfl: 5   cromolyn  (OPTICROM ) 4 % ophthalmic solution, Place 1 drop into both eyes daily., Disp: 10 mL, Rfl: 3   cycloSPORINE  (RESTASIS ) 0.05 % ophthalmic emulsion, Place 1 drop into both eyes 2 (two) times daily., Disp: 180 each, Rfl: 2   Ensure (ENSURE), Take 237 mLs by mouth 2 (two) times daily between meals. (Patient not taking: Reported on 12/22/2024), Disp: 237 mL, Rfl: 6   EPINEPHrine  (EPIPEN  2-PAK) 0.3 mg/0.3 mL IJ SOAJ injection, Inject 1 pen (0.3 mg) into the skin as directed as needed for anaphylaxis., Disp: 2 each, Rfl: 1   hydrocortisone  (ANUSOL -HC) 2.5 % rectal cream, Place 1 Application rectally 2 (two) times daily. (Patient taking differently: Place 1 Application rectally daily as needed for hemorrhoids.), Disp: 30 g, Rfl: 0   hydrOXYzine  (ATARAX ) 50 MG tablet, Take 1 tablet (50 mg total) by mouth up to 3 (three) times daily as needed for anxiety. (Patient taking differently: Take 50 mg by mouth daily as needed for anxiety.), Disp: 90 tablet, Rfl: 1   lamoTRIgine  (LAMICTAL ) 200 MG tablet, Take 1 tablet (200 mg total) by mouth daily., Disp: 30 tablet, Rfl: 4   mirabegron  ER (MYRBETRIQ ) 25 MG TB24 tablet, Take 1 tablet (25 mg total) by mouth daily., Disp: 30 tablet, Rfl: 11   ondansetron  (ZOFRAN -ODT) 4 MG disintegrating tablet, Dissolve 1 tablet (4 mg total) by mouth 3 (three) times daily as needed., Disp: 20 tablet, Rfl: 3   propranolol  (INDERAL ) 20 MG tablet, Take 1 tablet (20 mg total) by mouth 2 (two) times daily., Disp: 180 tablet, Rfl: 3   QUEtiapine  (SEROQUEL ) 100 MG tablet, Take 1 tablet (100 mg total) by mouth 3 (three) times daily. (Patient taking differently:  Take 200 mg by mouth at bedtime.), Disp: 90 tablet, Rfl: 4   rosuvastatin  (CRESTOR ) 20 MG tablet, Take 1 tablet (20 mg total) by mouth daily., Disp: 90 tablet, Rfl: 3   dexlansoprazole  (DEXILANT ) 60 MG capsule, Take 1 capsule (60 mg total) by mouth daily., Disp: 30 capsule, Rfl: 3   diclofenac Sodium (VOLTAREN) 1 %  GEL, Apply 1 Application topically daily as needed (shoulder pain)., Disp: , Rfl:    ibuprofen  (ADVIL ) 200 MG tablet, Take 800 mg by mouth every 6 (six) hours as needed for mild pain (pain score 1-3) or moderate pain (pain score 4-6)., Disp: , Rfl:    lubiprostone  (AMITIZA ) 8 MCG capsule, Take 1 capsule (8 mcg total) by mouth 2 (two) times daily with a meal., Disp: 60 capsule, Rfl: 3   Menthol-Methyl Salicylate (MUSCLE RUB EX), Apply 1 Application topically daily as needed., Disp: , Rfl:    nystatin  (MYCOSTATIN /NYSTOP ) powder, Apply 1 Application topically daily as needed., Disp: , Rfl:    phenylephrine -shark liver oil-mineral oil-petrolatum  (PREPARATION H) 0.25-14-74.9 % rectal ointment, Place 1 Application rectally 2 (two) times daily as needed for hemorrhoids., Disp: 56 g, Rfl: 1  Consent:    NA  Disposition:   57-month follow-up sooner if needed Her questions and concerns were addressed to her satisfaction. She voices understanding of the recommendations provided during this encounter.    Signed, Madonna Michele HAS, [facility]  A Division of [facility] [address], [place], [place] [address]  11:48 AM  "

## 2024-12-24 ENCOUNTER — Other Ambulatory Visit (HOSPITAL_BASED_OUTPATIENT_CLINIC_OR_DEPARTMENT_OTHER): Payer: Self-pay

## 2024-12-24 ENCOUNTER — Other Ambulatory Visit (HOSPITAL_COMMUNITY): Payer: Self-pay

## 2024-12-24 ENCOUNTER — Other Ambulatory Visit: Payer: Self-pay

## 2024-12-24 ENCOUNTER — Telehealth: Payer: Self-pay | Admitting: Gastroenterology

## 2024-12-24 ENCOUNTER — Telehealth: Payer: Self-pay | Admitting: *Deleted

## 2024-12-24 ENCOUNTER — Ambulatory Visit: Admitting: Gastroenterology

## 2024-12-24 ENCOUNTER — Encounter: Payer: Self-pay | Admitting: Gastroenterology

## 2024-12-24 VITALS — BP 139/83 | HR 59 | Temp 97.7°F | Ht 65.5 in | Wt 278.6 lb

## 2024-12-24 DIAGNOSIS — Z8719 Personal history of other diseases of the digestive system: Secondary | ICD-10-CM | POA: Diagnosis not present

## 2024-12-24 DIAGNOSIS — R09A2 Foreign body sensation, throat: Secondary | ICD-10-CM | POA: Diagnosis not present

## 2024-12-24 DIAGNOSIS — Z1211 Encounter for screening for malignant neoplasm of colon: Secondary | ICD-10-CM

## 2024-12-24 DIAGNOSIS — K642 Third degree hemorrhoids: Secondary | ICD-10-CM | POA: Diagnosis not present

## 2024-12-24 DIAGNOSIS — R1310 Dysphagia, unspecified: Secondary | ICD-10-CM

## 2024-12-24 DIAGNOSIS — K219 Gastro-esophageal reflux disease without esophagitis: Secondary | ICD-10-CM | POA: Diagnosis not present

## 2024-12-24 DIAGNOSIS — K449 Diaphragmatic hernia without obstruction or gangrene: Secondary | ICD-10-CM

## 2024-12-24 DIAGNOSIS — K581 Irritable bowel syndrome with constipation: Secondary | ICD-10-CM

## 2024-12-24 DIAGNOSIS — K2100 Gastro-esophageal reflux disease with esophagitis, without bleeding: Secondary | ICD-10-CM

## 2024-12-24 MED ORDER — LUBIPROSTONE 8 MCG PO CAPS
8.0000 ug | ORAL_CAPSULE | Freq: Two times a day (BID) | ORAL | 3 refills | Status: AC
  Filled 2024-12-24: qty 60, 30d supply, fill #0

## 2024-12-24 MED ORDER — DEXLANSOPRAZOLE 60 MG PO CPDR
60.0000 mg | DELAYED_RELEASE_CAPSULE | Freq: Every day | ORAL | 3 refills | Status: AC
  Filled 2024-12-24 – 2025-01-07 (×3): qty 30, 30d supply, fill #0

## 2024-12-24 MED ORDER — PREPARATION H 0.25-14-74.9 % RE OINT
1.0000 | TOPICAL_OINTMENT | Freq: Two times a day (BID) | RECTAL | 1 refills | Status: AC | PRN
  Filled 2024-12-24: qty 56, fill #0

## 2024-12-24 NOTE — Progress Notes (Signed)
 "  GI Office Note    Referring Provider: Edman Meade PEDLAR, FNP Primary Care Physician:  Edman Meade PEDLAR, FNP Primary Gastroenterologist: Lamar HERO.Rourk, MD  Date:  12/24/2024  ID:  Rachel Underwood, DOB 11-23-1971, MRN [id number]   Chief Complaint   Chief Complaint  Patient presents with   Follow-up    Follow up. Still having issues with constipation and hemorrhoids    History of Present Illness  Rachel Underwood is a 54 y.o. female with a history of SVT, HTN, bipolar disorder, HIV, HSV, lupus, polysubstance abuse, tobacco use, GERD, fibromyalgia, dysphagia s/p multiple dilations in the past, carpal tunnel, seizures?,  IBS with constipation, and hemorrhoids presenting today with complaint of worsening constipation and ongoing issues with hemorrhoids.  EGD January 2016: - Erosive reflux esophagitis. - Noncritical Shatzkis ring - status post Maloney dilation.  - Very small hiatal hernia.  - Subtly abnormal gastric mucosa of uncertain clinical significance- status post biopsy - Path: Mild chronic inactive gastritis - Continue pantoprazole  40 mg once daily.   Colonoscopy January 2016: - Anal canal hemorrhoids- likely source of hematochezia.  - Few left- sided diverticula - normal colonoscopy otherwise - Ten- day course of Anusol  suppositories 1 per rectum twice a day.  - Continue Amitiza  8 mcg twice daily.  - Add Benefiber 2 teaspoons twice daily to her regimen.  - Office visit with us  in 6 weeks.   GES in [place] with possible delayed gastric emptying per her report.  Last office visit 09/23/2024.  Noted history of GERD with a previous diagnosis of hiatal hernia about 5 years prior.  Having worsening reflux when lying down followed by sensation of her throat closing 1 swallowing and regurgitation of stomach contents when lying down.  Had been off pantoprazole  since her release from prison in the summertime.  Had used some of her roommates to Dexilant  and provided relief.  She noted Linzess   to be effective in the past but 290 mcg caused significant diarrhea.  Usually has severe constipation.  Without productive bowel movements she was experiencing bloating and abdominal pain.  Noted history of issues with dysphagia in the past requiring esophageal dilation multiple times.  Constant sensation of something feeling stuck in her throat at times especially when eating.  Prescribe pantoprazole  40 mg twice daily and assess symptom improvement in 2 weeks.  If symptoms persist advised upper endoscopy.  Will need to consider scheduling colonoscopy in January for colon cancer screening.  Advised we would evaluate for hemorrhoids in January before her colonoscopy.  Gave Anusol  cream for hemorrhoids.  Discussed potential hemorrhoid banding. Gave samples of Linzess  145 mcg.  Echo stress test October 2025 with inconclusive for ischemia.  Indeterminate study.  [facility] treadmill score +2, intermediate risk given limited exercise tolerance.  Target heart rate not achieved.  2D echo findings with a baseline EF of 60%.  Recently seen by cardiology 12/19/2024 and was advised to increase rosuvastatin .  Coronary CTA performed and notes severe CAC and mild nonobstructive coronary artery disease.  Due to her bilateral lower extremity swelling and varicosities, she was referred to vascular surgery for vein workup.  CCTA  11/04/2024 1. Coronary calcium  score of 761. This was 99th percentile for age-, sex, and race-matched controls. 2. Normal coronary origin with right dominance. 3. Nonobstructive CAD 4. Mild (25-49%) stenosis in proximal to mid LAD, distal LAD, OM1, OM3, and proximal RCA  5. Minimal (0-24%) stenosis in proximal/mid LCX, OM2, and mid/distal RCA 6. Radiology Overread: Moderate-sized hiatal hernia. RECOMMENDATIONS:  CAD-RADS 2: Mild non-obstructive CAD (25-49%). Consider non-atherosclerotic causes of chest pain. Consider preventive therapy and risk factor modification.   Today:  Discussed the use of AI  scribe software for clinical note transcription with the patient, who gave verbal consent to proceed.  Gastroesophageal reflux symptoms have persisted despite pantoprazole  therapy, with no improvement noted. Previous use of Dexilant  provided good effect. A known hiatal hernia, initially small and now moderate in size on recent imaging, is present. She describes a sensation of esophageal narrowing with swallowing, stating when I swallow it feels like it's closing, which has been longstanding. Multiple prior esophageal dilations have provided relief. Frequent belching with a rotten egg odor is socially embarrassing. She has not had an upper endoscopy in several years.  Chronic constipation is characterized by infrequent bowel movements, with the last decent movement occurring three to four days ago. Prior episodes have lasted up to a month without a bowel movement. Linzess  145 mcg has provided minimal benefit, resulting in bowel movements every other day at best. Linzess  290 mcg caused excessive diarrhea and was used only as needed. Amitiza  was used in the past, but effectiveness is not recalled. Significant straining with defecation is present, and she reports a sensation of intestinal swelling at times. She reports that pain medication after prior surgeries has worsened her constipation.  Hemorrhoidal symptoms include prolapse with straining and defecation, sometimes protruding significantly. Irritation and occasional bleeding occur, especially after excessive wiping, but bleeding is only present with prolapse. Anusol  has not provided significant relief; Preparation H offers mild improvement in swelling and pain. Sitz baths are not feasible due to difficulty getting out of the tub, but a sitz bath bowl and heating pad have been used for symptomatic relief. History of vaginal cancer leads her to avoid soaking in pools or tubs.  She is scheduled for right shoulder repair surgery next week, which will be  her second surgery on the right shoulder and third overall. The most recent injury occurred in prison 1-2 months prior to release, resulting in a fall onto her right shoulder. Current weight is approximately 270 pounds, up from 220 pounds at the time of injury. She is right-hand dominant and anticipates postoperative pain medication, which has previously worsened constipation.  Does have regular chest pain and has been under the care of cardiology for further evaluation.     Wt Readings from Last 6 Encounters:  12/24/24 278 lb 9.6 oz (126.4 kg)  12/19/24 274 lb 6.4 oz (124.5 kg)  10/16/24 260 lb 6 oz (118.1 kg)  09/29/24 259 lb (117.5 kg)  09/23/24 258 lb (117 kg)  09/08/24 250 lb 6.4 oz (113.6 kg)    Body mass index is 45.66 kg/m.   Current Outpatient Medications  Medication Sig Dispense Refill   acyclovir  (ZOVIRAX ) 200 MG capsule Take 2 capsules (400 mg total) by mouth 2 (two) times daily. 120 capsule 11   albuterol  (VENTOLIN  HFA) 108 (90 Base) MCG/ACT inhaler INHALE 2 PUFFS INTO THE LUNGS EVERY 4 HOURS AS NEEDED FOR WHEEZING OR SHORTNESS OF BREATH. 18 g 2   amLODipine  (NORVASC ) 10 MG tablet Take 1 tablet (10 mg total) by mouth daily. 90 tablet 3   cromolyn  (OPTICROM ) 4 % ophthalmic solution Place 1 drop into both eyes daily. 10 mL 3   cycloSPORINE  (RESTASIS ) 0.05 % ophthalmic emulsion Place 1 drop into both eyes 2 (two) times daily. 180 each 2   EPINEPHrine  (EPIPEN  2-PAK) 0.3 mg/0.3 mL IJ SOAJ injection Inject 1 pen (0.3  mg) into the skin as directed as needed for anaphylaxis. 2 each 1   lamoTRIgine  (LAMICTAL ) 200 MG tablet Take 1 tablet (200 mg total) by mouth daily. 30 tablet 4   mirabegron  ER (MYRBETRIQ ) 25 MG TB24 tablet Take 1 tablet (25 mg total) by mouth daily. 30 tablet 11   nystatin  (MYCOSTATIN /NYSTOP ) powder Apply 1 Application topically daily as needed.     ondansetron  (ZOFRAN -ODT) 4 MG disintegrating tablet Dissolve 1 tablet (4 mg total) by mouth 3 (three) times daily as  needed. 20 tablet 3   pantoprazole  (PROTONIX ) 40 MG tablet Take 1 tablet (40 mg total) by mouth 2 (two) times daily before a meal. 60 tablet 3   propranolol  (INDERAL ) 20 MG tablet Take 1 tablet (20 mg total) by mouth 2 (two) times daily. 180 tablet 3   rosuvastatin  (CRESTOR ) 20 MG tablet Take 1 tablet (20 mg total) by mouth daily. 90 tablet 3   bictegravir-emtricitabine -tenofovir  AF (BIKTARVY ) 50-200-25 MG TABS tablet Take 1 tablet by mouth daily. (Patient taking differently: Take 1 tablet by mouth at bedtime.) 30 tablet 5   diclofenac Sodium (VOLTAREN) 1 % GEL Apply 1 Application topically daily as needed (shoulder pain).     Ensure (ENSURE) Take 237 mLs by mouth 2 (two) times daily between meals. (Patient not taking: Reported on 12/22/2024) 237 mL 6   hydrocortisone  (ANUSOL -HC) 2.5 % rectal cream Place 1 Application rectally 2 (two) times daily. (Patient taking differently: Place 1 Application rectally daily as needed for hemorrhoids.) 30 g 0   hydrOXYzine  (ATARAX ) 50 MG tablet Take 1 tablet (50 mg total) by mouth up to 3 (three) times daily as needed for anxiety. (Patient taking differently: Take 50 mg by mouth daily as needed for anxiety.) 90 tablet 1   ibuprofen  (ADVIL ) 200 MG tablet Take 800 mg by mouth every 6 (six) hours as needed for mild pain (pain score 1-3) or moderate pain (pain score 4-6).     linaclotide  (LINZESS ) 145 MCG CAPS capsule Take 1 capsule (145 mcg total) by mouth daily before breakfast. (Patient not taking: Reported on 12/22/2024)     Menthol-Methyl Salicylate (MUSCLE RUB EX) Apply 1 Application topically daily as needed.     QUEtiapine  (SEROQUEL ) 100 MG tablet Take 1 tablet (100 mg total) by mouth 3 (three) times daily. (Patient taking differently: Take 200 mg by mouth at bedtime.) 90 tablet 4   No current facility-administered medications for this visit.    Past Medical History:  Diagnosis Date   Abnormal Pap smear of cervix 2005(EST)   ckc for abnl pap- [facility]  [place]   Allergy    Asthma    Bipolar 1 disorder (HCC)    Borderline personality disorder (HCC)    BPD (bronchopulmonary dysplasia) (HCC)    Carpal tunnel syndrome    bilateral wrist   Depression    Dysmenorrhea 06/03/2014   Enlarged uterus 06/03/2014   Fibromyalgia    GERD (gastroesophageal reflux disease)    Herpes simplex without mention of complication    HIV (human immunodeficiency virus infection) (HCC)    Hyperlipidemia    Hypertension    Menorrhagia with irregular cycle 06/03/2014   Osteoarthritis    Seizures (HCC)    had as a teenager, unknown etiology and no meds.    Past Surgical History:  Procedure Laterality Date   ABDOMINAL HYSTERECTOMY N/A 09/01/2014   Procedure: HYSTERECTOMY ABDOMINAL;  Surgeon: Norleen Edsel GAILS, MD;  Location: [facility];  Service: Gynecology;  Laterality: N/A;  ANKLE SURGERY Right    repair of ligaments   BACK SURGERY     BILATERAL SALPINGECTOMY Bilateral 09/01/2014   Procedure: BILATERAL SALPINGECTOMY;  Surgeon: Norleen Edsel GAILS, MD;  Location: [facility];  Service: Gynecology;  Laterality: Bilateral;   CESAREAN SECTION     ckc     COLONOSCOPY N/A 12/23/2014   MFM:jwjo canal hemorrhoids left-sided diverticula   ESOPHAGOGASTRODUODENOSCOPY N/A 12/23/2014   MFM:[id number] reflux/noncritical shatzkis ring s/p dilation/small HH   EXCISION OF SKIN TAG N/A 09/01/2014   Procedure: EXCISION OF SKIN NEVUS;  Surgeon: Norleen Edsel GAILS, MD;  Location: [facility];  Service: Gynecology;  Laterality: N/A;   FRACTURE SURGERY     MALONEY DILATION N/A 12/23/2014   Procedure: AGAPITO DILATION;  Surgeon: Lamar CHRISTELLA Hollingshead, MD;  Location: [facility];  Service: Endoscopy;  Laterality: N/A;   SAVORY DILATION N/A 12/23/2014   Procedure: SAVORY DILATION;  Surgeon: Lamar CHRISTELLA Hollingshead, MD;  Location: [facility];  Service: Endoscopy;  Laterality: N/A;   SCAR REVISION N/A 09/01/2014   Procedure: EXCISION OF CICATRIX;  Surgeon: Norleen Edsel GAILS, MD;  Location: [facility];  Service: Gynecology;   Laterality: N/A;   SHOULDER SURGERY Right    rotator cuff   SPINE SURGERY      Family History  Problem Relation Age of Onset   Fibroids Mother    Hypertension Mother    Depression Mother    Irritable bowel syndrome Mother    Arthritis Mother    Heart disease Mother    Mental illness Mother    Stroke Mother    Vision loss Mother    Lupus Mother    Colon polyps Mother    Hypertension Father    Mental illness Father    Arthritis Father    Heart disease Father    Vision loss Father    Colon polyps Father    Hypertension Brother    Early death Brother    Learning disabilities Brother    Mental illness Brother    Heart disease Maternal Grandmother    Cancer Maternal Grandmother        breast   Depression Maternal Grandmother    Hyperlipidemia Maternal Grandmother    Hypertension Maternal Grandmother    Heart disease Maternal Grandfather    Alzheimer's disease Maternal Grandfather    Hyperlipidemia Maternal Grandfather    Hypertension Maternal Grandfather    Dementia Paternal Grandmother    Hyperlipidemia Paternal Grandmother    Hypertension Paternal Grandmother    Cancer Paternal Grandfather        prostate   Heart disease Paternal Grandfather    Hyperlipidemia Paternal Grandfather    Hypertension Paternal Grandfather    Bipolar disorder Daughter    Schizophrenia Daughter    Lupus Daughter    Anxiety disorder Daughter    ADD / ADHD Son    Colon cancer Neg Hx     Allergies as of 12/24/2024 - Review Complete 12/24/2024  Allergen Reaction Noted   Flecainide  Swelling 03/08/2016   Morphine Shortness Of Breath 07/24/2024   Piroxicam Anaphylaxis 05/12/2015   Abilify [aripiprazole]  05/12/2015   Citalopram Hives 05/12/2015   Codeine Nausea And Vomiting 03/25/2014   Depakote [divalproex sodium] Swelling 05/12/2015   Gabapentin  05/13/2014   Haldol [haloperidol lactate] Other (See Comments) 03/25/2014   Haloperidol  09/06/2020   Indomethacin Swelling 12/11/2014    Invega [paliperidone] Other (See Comments) 06/10/2014   Latuda  [lurasidone  hcl] Itching 05/12/2015   Lithium Hives 06/03/2014  Lurasidone   09/06/2020   Promethazine  Swelling 02/03/2015   Trazodone and nefazodone Other (See Comments) 12/18/2014   Lexapro [escitalopram oxalate] Rash 07/15/2013    Social History   Socioeconomic History   Marital status: Divorced    Spouse name: Not on file   Number of children: 3   Years of education: Not on file   Highest education level: Some college, no degree  Occupational History   Not on file  Tobacco Use   Smoking status: Every Day    Current packs/day: 0.50    Average packs/day: 0.5 packs/day for 0.6 years (0.3 ttl pk-yrs)    Types: E-cigarettes, Cigarettes    Start date: 06/2024   Smokeless tobacco: Never   Tobacco comments:    1 PPD  Vaping Use   Vaping status: Some Days  Substance and Sexual Activity   Alcohol use: Not Currently   Drug use: No    Types: Crack cocaine    Comment: last cocaine use July 2024   Sexual activity: Not Currently    Partners: Male    Birth control/protection: Surgical    Comment: declined condoms  Other Topics Concern   Not on file  Social History Narrative   Lives with a friend at the moment    Social Drivers of Health   Tobacco Use: High Risk (12/24/2024)   Patient History    Smoking Tobacco Use: Every Day    Smokeless Tobacco Use: Never    Passive Exposure: Not on file  Financial Resource Strain: High Risk (09/26/2024)   Overall Financial Resource Strain (CARDIA)    Difficulty of Paying Living Expenses: Hard  Food Insecurity: Food Insecurity Present (09/26/2024)   Epic    Worried About Programme Researcher, Broadcasting/film/video in the Last Year: Sometimes true    Ran Out of Food in the Last Year: Sometimes true  Transportation Needs: Unmet Transportation Needs (09/26/2024)   Epic    Lack of Transportation (Medical): Yes    Lack of Transportation (Non-Medical): Yes  Physical Activity: Unknown (09/26/2024)    Exercise Vital Sign    Days of Exercise per Week: Patient declined    Minutes of Exercise per Session: Not on file  Stress: Stress Concern Present (09/26/2024)   [employer] of Occupational Health - Occupational Stress Questionnaire    Feeling of Stress: Very much  Social Connections: Moderately Isolated (09/26/2024)   Social Connection and Isolation Panel    Frequency of Communication with Friends and Family: More than three times a week    Frequency of Social Gatherings with Friends and Family: Once a week    Attends Religious Services: More than 4 times per year    Active Member of [employer] or Organizations: No    Attends Banker Meetings: Not on file    Marital Status: Divorced  Depression (PHQ2-9): Medium Risk (10/16/2024)   Depression (PHQ2-9)    PHQ-2 Score: 9  Alcohol Screen: Not on file  Housing: Unknown (09/26/2024)   Epic    Unable to Pay for Housing in the Last Year: No    Number of Times Moved in the Last Year: Not on file    Homeless in the Last Year: No  Utilities: Not on file  Health Literacy: Not on file    Review of Systems   Gen: + weight gain. Denies fever, chills, anorexia. Denies fatigue, weakness, weight loss.  CV: + intermittent chest pain and lower extremity edema. Denies syncope and claudication. Resp: Denies dyspnea at rest, cough,  wheezing, coughing up blood, and pleurisy. GI: See HPI Psych: Denies memory loss, confusion. No homicidal or suicidal ideation.  Heme: Denies bruising, bleeding, and enlarged lymph nodes. + shoulder pain.  Physical Exam   BP 139/83 (BP Location: Right Arm, Patient Position: Sitting, Cuff Size: Large)   Pulse (!) 59   Temp 97.7 F (36.5 C) (Temporal)   Ht 5' 5.5 (1.664 m)   Wt 278 lb 9.6 oz (126.4 kg)   LMP 08/11/2014   BMI 45.66 kg/m   General:   Alert and oriented. No distress noted. Pleasant and cooperative.  Head:  Normocephalic and atraumatic. Eyes:  Conjuctiva clear without scleral  icterus. Lungs:  Clear to auscultation bilaterally. No wheezes, rales, or rhonchi. No distress.  Heart:  S1, S2 present without murmurs appreciated.  Abdomen:  +BS, soft, non-tender and non-distended. No rebound or guarding. No HSM or masses noted. Rectal: deferred Msk:  Symmetrical without gross deformities. Normal posture. Extremities:  Without edema. Neurologic:  Alert and  oriented x4 Psych:  Alert and cooperative. Normal mood and affect.  Assessment & Plan  Lezlee Trettin is a 54 y.o. female presenting today with complaints of ongoing constipation and issues with hemorrhoids.    IBS constipation Chronic, refractory constipation with significant straining and abdominal discomfort, anticipated to worsen postoperatively due to expected opioid use. - Prescribed Amitiza  8 mcg twice daily with instructions to take with food to minimize nausea. - Option to increase Amitiza  to 24 mcg if 8 mcg is insufficient. - Advised concurrent use of Miralax and stool softeners, especially if opioid-induced constipation occurs postoperatively. - Instructed to monitor for diarrhea and reduce Amitiza  to once daily if diarrhea persists for more than one week. - Discussed alternative agents (Trulance, Ibsrela) if current regimen is ineffective. - Plan to reassess efficacy and adjust therapy as needed.  Grade 3 Internal hemorrhoids Symptomatic, recurrently prolapsing and bleeding internal hemorrhoids, refractory to Anusol , with physical limitations precluding sitz baths; banding considered after colonoscopy. - Recommended continued use of Preparation H for symptomatic relief and to discontinue Anusol  if Preparation H is more effective. - Advised use of warm washcloths or heating pad for local comfort as alternatives to sitz baths. - Discussed hemorrhoid banding as a future intervention after colonoscopy, including expected discomfort, healing process, and recurrence risk. - Advised to avoid heavy lifting and  straining to minimize exacerbation.  Gastroesophageal reflux disease with hiatal hernia, mild dysphagia/globus sensation Chronic, refractory GERD with moderate hiatal hernia and persistent symptoms despite pantoprazole  40 mg BID.  History of esophageal dysphagia and globus sensation/ feeling of throat closing with prior symptomatic improvement after esophageal dilation.  She desires surgical consultation regarding hiatal hernia and more definitive treatment for reflux. - Prescribed Dexilant  60 mg daily with plan to decrease to 30 mg if symptoms improve. - If insurance denies Dexilant , will pursue alternative PPI therapy per formulary. - Ordered upper endoscopy with possible dilation for assessment for H. pylori, gastritis, esophagitis, duodenitis, esophageal web, esophageal stricture, or Schatzki's ring, etc. - Referred to [facility] in [place] for evaluation of surgical options for reflux and/or hiatal hernia repair, to be scheduled after recovery from shoulder surgery and after endoscopic evaluation. - Discussed need for updated endoscopy prior to surgical intervention. - Plan to coordinate timing of procedures with recovery from orthopedic surgery and functional status.     Screening colon cancer Last colonoscopy in 2016 with hemorrhoids, diverticulosis, and otherwise normal exam.  Currently due for repeat screening.  Does have intermittent rectal  bleeding which is hemorrhoidal in nature. - Scheduled for colonoscopy  Procedure: Hold Proceed with colonoscopy and upper endoscopy with dilation with propofol  by Dr. Shaaron in near future: the risks, benefits, and alternatives have been discussed with the patient in detail. The patient states understanding and desires to proceed. ASA 3  Clearance from orthopedics to proceed with procedure 4 to 6 weeks after shoulder repair  Medical clearance from cardiology given ongoing chest pain  Amitiza  twice daily for 5 days prior  MiraLAX  daily for 3 days prior  UDS  Follow up   Follow up post procedure    Charmaine Melia, MSN, FNP-BC, AGACNP-BC [facility] "

## 2024-12-24 NOTE — Patient Instructions (Addendum)
 We will get you scheduled for upper endoscopy and colonoscopy in the near future with Dr. Shaaron to evaluate your reflux, trouble swallowing, and screening for colon cancer.  We will plan to do this late February or early March pending healing of your shoulder surgery.   For now continue pantoprazole  twice daily until we are able to get you on something different like Dexilant  60 mg once daily given this is been helpful in the past.  Again if insurance does not cover this we will send in a different alternative for you.  In regards to this moderate hiatal hernia, I will send a referral to [facility] for you to [facility] surgery to discuss management of your hernia as well as discussion of reflux surgery.  Start taking Amitiza  8 mcg twice daily with food for constipation.  If insurance will not cover it, we will send in whichever alternative is on formulary.  For hemorrhoids continue conservative management with warm soaks or using a heating pad if needed for discomfort.  If able to obtain Preparation H over-the-counter and recommend continuing this at least twice daily as needed.  Follow a GERD diet:  Avoid fried, fatty, greasy, spicy, citrus foods. Avoid caffeine and carbonated beverages. Avoid chocolate. Try eating 4-6 small meals a day rather than 3 large meals. Do not eat within 3 hours of laying down. Prop head of bed up on wood or bricks to create a 6 inch incline.  Follow-up with your upcoming shoulder surgery.  It was a pleasure to see you today. I want to create trusting relationships with patients. If you receive a survey regarding your visit,  I greatly appreciate you taking time to fill this out on paper or through your MyChart. I value your feedback.  Charmaine Melia, MSN, FNP-BC, AGACNP-BC [facility]

## 2024-12-24 NOTE — Telephone Encounter (Signed)
" °  12/24/24  Reve Portales 04/13/1971  What type of surgery is being performed? COLONOSCOPY/EGD WITH POSSIBLE ESOPHAGEAL DILATION  When is surgery scheduled? TBD  What type of clearance is required (medical or pharmacy to hold medication or both? MEDICAL  Name of physician performing surgery?  Dr. Shaaron Rouse [facility] at [facility] Phone: [phone], option 5 Fax: [phone]  Anesthesia type (none, local, MAC, general)? MAC    "

## 2024-12-24 NOTE — Patient Instructions (Signed)
 "      Rachel Underwood  12/24/2024     @[email or web address] @   Your procedure is scheduled on  12/30/2024.   [facility] to [facility] at  0820  A.M.   Call this number if you have problems the morning of surgery:  [phone]  If you experience any cold or flu symptoms such as cough, fever, chills, shortness of breath, etc. between now and your scheduled surgery, please notify us  at the above number.   Remember:  Do not eat after midnight.   You may drink clear liquids until 0615 am on 12/30/2024.   Clear liquids allowed are:                    Water , Carbonated beverages (diabetics please choose diet or no sugar options), Black Coffee Only (No creamer, milk or cream, including half & half and powdered creamer), and Clear Sports drink (No red color; diabetics please choose diet or no sugar options)    Take these medicines the morning of surgery with A SIP OF WATER        amlodipine , dexlanoprazole, hydroxyzine , lamictal , propranolol , zofran  (if needed).    Do not wear jewelry, make-up or nail polish, including gel polish,  artificial nails, or any other type of covering on natural nails (fingers and  toes).  Do not wear lotions, powders, or perfumes, or deodorant.  Do not shave 48 hours prior to surgery.  Men may shave face and neck.  Do not bring valuables to the hospital.  [facility] is not responsible for any belongings or valuables.  Contacts, dentures or bridgework may not be worn into surgery.  Leave your suitcase in the car.  After surgery it may be brought to your room.  For patients admitted to the hospital, discharge time will be determined by your treatment team.  Patients discharged the day of surgery will not be allowed to drive home and must have someone with them for 24 hours.    Special instructions:  DO NOT smoke tobacco or vape for 24 hours before your procedure.  Please read over the following fact sheets that you were given. Anesthesia Post-op Instructions and  Care and Recovery After Surgery      Surgery for a Rotator Cuff Tear: What to Know After After surgery for a rotator cuff tear, it's common to have swelling and pain. You may also have: A small amount of fluid coming from the cuts that were made on your shoulder. Stiffness. This will get better over time. Follow these instructions at home: If you have a sling or an immobilizer: Wear the sling or immobilizer as told. Take it off only if your health care provider says you can. Loosen the sling or immobilizer if your fingers tingle, are numb, or turn cold and blue. Keep the sling or immobilizer clean and dry. Bathing Do not take baths, swim, or use a hot tub until you're told it's OK. Ask if you can shower. Keep your bandage dry until your provider says it can be removed. If your sling or shoulder immobilizer isn't waterproof: Do not let it get wet. Cover it when you take a bath or shower. Use a cover that doesn't let any water  in. Once the sling or immobilizer is removed, try not to move your shoulder until your provider says you can. Caring for your cuts from surgery  Take care of your cuts from surgery as told. Make sure you: Wash your hands with soap and water   for at least 20 seconds before and after you change your bandage. If you can't use soap and water , use hand sanitizer. Change your bandage. Leave stitches or skin glue alone. Leave tape strips alone unless you're told to take them off. You may trim the edges of the tape strips if they curl up. Check the area around your cuts every day for signs of infection. Check for: More redness, swelling, or pain. More fluid or blood. Warmth. Pus or a bad smell. Managing pain, stiffness, and swelling  Use ice or an ice pack as told. Place a towel between your skin and the ice. Leave the ice on for 20 minutes, 2-3 times a day. If your skin turns red, take off the ice right away to prevent skin damage. The risk of damage is higher if you  can't feel pain, heat, or cold. Move your fingers often to reduce stiffness and swelling. Raise your upper body above the level of your heart when you lie down and when you sleep. Do not sleep on your belly. Do not sleep on the side that your surgery was done on. Medicines Take your medicines only as told. You may need to take steps to help treat or prevent trouble pooping (constipation), such as: Taking medicines to help you poop. Eating foods high in fiber, like beans, whole grains, and fresh fruits and vegetables. Drinking more fluids as told. Ask your provider if it's safe to drive or use machines while taking your medicine. Driving If you were given a sedative, do not drive or use machines until you're told it's safe. A sedative can make you sleepy. Ask when it's safe to drive if you have a sling or immobilizer on your arm. Activity Do not use your arm to support your body weight until your provider says it's OK. Do not lift or hold anything with your arm until your provider says it's OK. Exercise as told. Ask what things are safe for you to do at home. Ask when you can go back to work or school. General instructions Do not smoke, vape, or use nicotine or tobacco. Doing this can slow down healing. Keep all follow-up visits. Your provider will check how your rotator cuff is healing. Your provider may give you more instructions. Make sure you know what you can and can't do. Contact a health care provider if: You have a fever or chills. You have any signs of infection. You have very bad pain. A cut from surgery opens. Get help right away if: You have trouble breathing. You have chest pain. You loosen your sling or immobilizer but your fingers still: Tingle. Are numb. Are cold and blue. This information is not intended to replace advice given to you by your health care provider. Make sure you discuss any questions you have with your health care provider. Document Revised:  10/24/2023 Document Reviewed: 10/24/2023 Elsevier Patient Education  2025 [employer].   General Anesthesia, Adult, Care After The following information offers guidance on how to care for yourself after your procedure. Your health care provider may also give you more specific instructions. If you have problems or questions, contact your health care provider. What can I expect after the procedure? After the procedure, it is common for people to: Have pain or discomfort at the IV site. Have nausea or vomiting. Have a sore throat or hoarseness. Have trouble concentrating. Feel cold or chills. Feel weak, sleepy, or tired (fatigue). Have soreness and body aches. These can affect parts  of the body that were not involved in surgery. Follow these instructions at home: For the time period you were told by your health care provider:  Rest. Do not participate in activities where you could fall or become injured. Do not drive or use machinery. Do not drink alcohol. Do not take sleeping pills or medicines that cause drowsiness. Do not make important decisions or sign legal documents. Do not take care of children on your own. General instructions Drink enough fluid to keep your urine pale yellow. If you have sleep apnea, surgery and certain medicines can increase your risk for breathing problems. Follow instructions from your health care provider about wearing your sleep device: Anytime you are sleeping, including during daytime naps. While taking prescription pain medicines, sleeping medicines, or medicines that make you drowsy. Return to your normal activities as told by your health care provider. Ask your health care provider what activities are safe for you. Take over-the-counter and prescription medicines only as told by your health care provider. Do not use any products that contain nicotine or tobacco. These products include cigarettes, chewing tobacco, and vaping devices, such as  e-cigarettes. These can delay incision healing after surgery. If you need help quitting, ask your health care provider. Contact a health care provider if: You have nausea or vomiting that does not get better with medicine. You vomit every time you eat or drink. You have pain that does not get better with medicine. You cannot urinate or have bloody urine. You develop a skin rash. You have a fever. Get help right away if: You have trouble breathing. You have chest pain. You vomit blood. These symptoms may be an emergency. Get help right away. Call 911. Do not wait to see if the symptoms will go away. Do not drive yourself to the hospital. Summary After the procedure, it is common to have a sore throat, hoarseness, nausea, vomiting, or to feel weak, sleepy, or fatigue. For the time period you were told by your health care provider, do not drive or use machinery. Get help right away if you have difficulty breathing, have chest pain, or vomit blood. These symptoms may be an emergency. This information is not intended to replace advice given to you by your health care provider. Make sure you discuss any questions you have with your health care provider. Document Revised: 02/17/2022 Document Reviewed: 02/17/2022 Elsevier Patient Education  2024 [employer]How to Use Chlorhexidine  at Home in the Shower Chlorhexidine  gluconate (CHG) is a germ-killing (antiseptic) wash that's used to clean the skin. It can get rid of the germs that normally live on the skin and can keep them away for about 24 hours. If you're having surgery, you may be told to shower with CHG at home the night before surgery. This can help lower your risk for infection. To use CHG wash in the shower, follow the steps below. Supplies needed: CHG body wash. Clean washcloth. Clean towel. How to use CHG in the shower Follow these steps unless you're told to use CHG in a different way: Start the shower. Use your normal soap and  shampoo to wash your face and hair. Turn off the shower or move out of the shower stream. Pour CHG onto a clean washcloth. Do not use any type of brush or rough sponge. Start at your neck, washing your body down to your toes. Make sure you: Wash the part of your body where the surgery will be done for at least 1 minute. Do  not scrub. Do not use CHG on your head or face unless your health care provider tells you to. If it gets into your ears or eyes, rinse them well with water . Do not wash your genitals with CHG. Wash your back and under your arms. Make sure to wash skin folds. Let the CHG sit on your skin for 1-2 minutes or as long as told. Rinse your entire body in the shower, including all body creases and folds. Turn off the shower. Dry off with a clean towel. Do not put anything on your skin afterward, such as powder, lotion, or perfume. Put on clean clothes or pajamas. If it's the night before surgery, sleep in clean sheets. General tips Use CHG only as told, and follow the instructions on the label. Use the full amount of CHG as told. This is often one bottle. Do not smoke and stay away from flames after using CHG. Your skin may feel sticky after using CHG. This is normal. The sticky feeling will go away as the CHG dries. Do not use CHG: If you have a chlorhexidine  allergy or have reacted to chlorhexidine  in the past. On open wounds or areas of skin that have broken skin, cuts, or scrapes. On babies younger than 5 months of age. Contact a health care provider if: You have questions about using CHG. Your skin gets irritated or itchy. You have a rash after using CHG. You swallow any CHG. Call your local poison control center [phone] in the [place] Your eyes itch badly, or they become very red or swollen. Your hearing changes. You have trouble seeing. If you can't reach your provider, go to an urgent care or emergency room. Do not drive yourself. Get help right away if: You  have swelling or tingling in your mouth or throat. You make high-pitched whistling sounds when you breathe, most often when you breathe out (wheeze). You have trouble breathing. These symptoms may be an emergency. Call 911 right away. Do not wait to see if the symptoms will go away. Do not drive yourself to the hospital. This information is not intended to replace advice given to you by your health care provider. Make sure you discuss any questions you have with your health care provider. Document Revised: 06/05/2023 Document Reviewed: 06/01/2022 Elsevier Patient Education  2024 [employer].How to Use an Incentive Spirometer An incentive spirometer is a tool that measures how well you are filling your lungs with each breath. Learning to take long, deep breaths using this tool can help you keep your lungs clear and active. This may help to reverse or lessen your chance of developing breathing (pulmonary) problems, especially infection. You may be asked to use a spirometer: After a surgery. If you have a lung problem or a history of smoking. After a long period of time when you have been unable to move or be active. If the spirometer includes an indicator to show the highest number that you have reached, your health care provider or respiratory therapist will help you set a goal. Keep a log of your progress as told by your health care provider. What are the risks? Breathing too quickly may cause dizziness or cause you to pass out. Take your time so you do not get dizzy or light-headed. If you are in pain, you may need to take pain medicine before doing incentive spirometry. It is harder to take a deep breath if you are having pain. How to use your incentive spirometer  Sit up  on the edge of your bed or on a chair. Hold the incentive spirometer so that it is in an upright position. Before you use the spirometer, breathe out normally. Place the mouthpiece in your mouth. Make sure your lips are  closed tightly around it. Breathe in slowly and as deeply as you can through your mouth, causing the piston or the ball to rise toward the top of the chamber. Hold your breath for 3-5 seconds, or for as long as possible. If the spirometer includes a coach indicator, use this to guide you in breathing. Slow down your breathing if the indicator goes above the marked areas. Remove the mouthpiece from your mouth and breathe out normally. The piston or ball will return to the bottom of the chamber. Rest for a few seconds, then repeat the steps 10 or more times. Take your time and take a few normal breaths between deep breaths so that you do not get dizzy or light-headed. Do this every 1-2 hours when you are awake. If the spirometer includes a goal marker to show the highest number you have reached (best effort), use this as a goal to work toward during each repetition. After each set of 10 deep breaths, cough a few times. This will help to make sure that your lungs are clear. If you have an incision on your chest or abdomen from surgery, place a pillow or a rolled-up towel firmly against the incision when you cough. This can help to reduce pain while taking deep breaths and coughing. General tips When you are able to get out of bed: Walk around often. Continue to take deep breaths and cough in order to clear your lungs. Keep using the incentive spirometer until your health care provider says it is okay to stop using it. If you have been in the hospital, you may be told to keep using the spirometer at home. Contact a health care provider if: You are having difficulty using the spirometer. You have trouble using the spirometer as often as instructed. Your pain medicine is not giving enough relief for you to use the spirometer as told. You have a fever. Get help right away if: You develop shortness of breath. You develop a cough with bloody mucus from the lungs. You have fluid or blood coming from an  incision site after you cough. Summary An incentive spirometer is a tool that can help you learn to take long, deep breaths to keep your lungs clear and active. You may be asked to use a spirometer after a surgery, if you have a lung problem or a history of smoking, or if you have been inactive for a long period of time. Use your incentive spirometer as instructed every 1-2 hours while you are awake. If you have an incision on your chest or abdomen, place a pillow or a rolled-up towel firmly against your incision when you cough. This will help to reduce pain. Get help right away if you have shortness of breath, you cough up bloody mucus, or blood comes from your incision when you cough. This information is not intended to replace advice given to you by your health care provider. Make sure you discuss any questions you have with your health care provider. Document Revised: 09/28/2023 Document Reviewed: 09/28/2023 Elsevier Patient Education  2024 [employer]. "

## 2024-12-24 NOTE — Telephone Encounter (Signed)
 Needing Clearance and time frame to schedule her procedure with Dr. Shaaron after her upcoming ortho procedure being done   What type of surgery is being performed? COLONOSCOPY/EGD WITH POSSIBLE ESOPHAGEAL DILATION   When is surgery scheduled? TBD   What type of clearance is required (medical or pharmacy to hold medication or both? MEDICAL   Name of physician performing surgery?  Dr. Shaaron [facility] Phone: [phone], option 5 Fax: [phone]   Anesthesia type (none, local, MAC, general)? MAC

## 2024-12-24 NOTE — Telephone Encounter (Signed)
 Dr. Michele,  You saw this patient on 12/19/24. Per office protocol, will you please comment on medical clearance for  COLONOSCOPY/EGD WITH POSSIBLE ESOPHAGEAL DILATION?  Please route your response to [facility] Preop. I will communicate with requesting office once you have given recommendations.   Thank you!  Mardy Pizza, NP

## 2024-12-24 NOTE — Telephone Encounter (Signed)
 Please add UDS to instructions for EGD and colonoscopy given history of substance abuse.

## 2024-12-24 NOTE — Addendum Note (Signed)
 Addended by: KENNEDY CHARMAINE CROME on: 12/24/2024 12:49 PM   Modules accepted: Orders

## 2024-12-24 NOTE — Telephone Encounter (Signed)
 noted

## 2024-12-25 NOTE — Telephone Encounter (Signed)
 Not before 1/27 and we are still awaiting cardiac clearance

## 2024-12-25 NOTE — H&P (Signed)
 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  She did not want injections she tried ibuprofen  home exercise and physical therapy in a formal setting but did not improve.  Based on her physical findings of weakness and painful forward elevation she was sent for MRI.  In the interim she had Celebrex which also did not help her symptoms    The MRI was reviewed and she does have a rotator cuff tear   I informed her about the rotator cuff tear.  I informed her that it would be nice to have the records related to her surgery but those are unavailable so I am not sure what we will get into once we get into the shoulder but if she is having significant pain which she says she is and is not being relieved by any other method which she says it is not then surgical repair is the next step   General Appearance the patient is overweight but is otherwise alert alert oriented x 3 mood affect normal normal grooming and hygiene  No gait abnormalities  Semination of the right shoulder: She has good passive range of motion although painful.  She has weak abduction and flexion.   Her neurovascular exam is intact she has a small incision over the shoulder from the prior surgery which appears to be an arthroscopic procedure   FINDINGS: Rotator cuff: Moderate supraspinatus tendinosis with a partial-thickness bursal surface tear along the anterior  insertion measuring 9 mm medial-lateral and 7 mm AP encompassing approximately 50% of the tendon footprint. Moderate infraspinatus tendinosis with a shallow low-grade partial-thickness bursal surface tear towards the insertion. Teres minor tendon is intact. Subscapularis tendon is intact.   Muscles: Mild atrophy of the teres minor muscle as can be seen with quadrilateral space syndrome without a space-occupying mass or fluid collection in the quadrilateral space. Otherwise, no muscle edema or atrophy.   Biceps Long Head: Intraarticular and extraarticular portions of the biceps tendon are intact.   Acromioclavicular Joint: Mild arthropathy of the acromioclavicular joint. Trace subacromial/subdeltoid bursal fluid.   Glenohumeral Joint: No joint effusion. No chondral defect.   Labrum: Grossly intact, but evaluation is limited by lack of intraarticular fluid/contrast.   Bones: No fracture or dislocation. No aggressive osseous lesion.   Other: No fluid collection or hematoma.   IMPRESSION: 1. Moderate supraspinatus tendinosis with a partial-thickness bursal surface tear along the anterior insertion measuring 9 mm medial-lateral and 7 mm AP encompassing approximately 50% of the tendon footprint. 2. Moderate infraspinatus tendinosis with a shallow low-grade partial-thickness bursal surface tear towards the insertion.     Electronically Signed   By: Julaine Blanch M.D.   On: 10/11/2024 10:23   Allergies[1]  Past Medical History:  Diagnosis Date   Abnormal Pap smear of cervix 2005(EST)   ckc for abnl pap- [facility] [place]   Allergy  Asthma    Bipolar 1 disorder (HCC)    Borderline personality disorder (HCC)    BPD (bronchopulmonary dysplasia) (HCC)    Carpal tunnel syndrome    bilateral wrist   Depression    Dysmenorrhea 06/03/2014   Enlarged uterus 06/03/2014   Fibromyalgia    GERD (gastroesophageal reflux disease)    Herpes simplex without mention of  complication    HIV (human immunodeficiency virus infection) (HCC)    Hyperlipidemia    Hypertension    Menorrhagia with irregular cycle 06/03/2014   Osteoarthritis    Seizures (HCC)    had as a teenager, unknown etiology and no meds.    Past Surgical History:  Procedure Laterality Date   ABDOMINAL HYSTERECTOMY N/A 09/01/2014   Procedure: HYSTERECTOMY ABDOMINAL;  Surgeon: Norleen Edsel GAILS, MD;  Location: [facility];  Service: Gynecology;  Laterality: N/A;   ANKLE SURGERY Right    repair of ligaments   BACK SURGERY     BILATERAL SALPINGECTOMY Bilateral 09/01/2014   Procedure: BILATERAL SALPINGECTOMY;  Surgeon: Norleen Edsel GAILS, MD;  Location: [facility];  Service: Gynecology;  Laterality: Bilateral;   CESAREAN SECTION     ckc     COLONOSCOPY N/A 12/23/2014   MFM:jwjo canal hemorrhoids left-sided diverticula   ESOPHAGOGASTRODUODENOSCOPY N/A 12/23/2014   MFM:zmndpcz reflux/noncritical shatzkis ring s/p dilation/small HH   EXCISION OF SKIN TAG N/A 09/01/2014   Procedure: EXCISION OF SKIN NEVUS;  Surgeon: Norleen Edsel GAILS, MD;  Location: [facility];  Service: Gynecology;  Laterality: N/A;   FRACTURE SURGERY     MALONEY DILATION N/A 12/23/2014   Procedure: AGAPITO DILATION;  Surgeon: Lamar CHRISTELLA Hollingshead, MD;  Location: [facility];  Service: Endoscopy;  Laterality: N/A;   SAVORY DILATION N/A 12/23/2014   Procedure: SAVORY DILATION;  Surgeon: Lamar CHRISTELLA Hollingshead, MD;  Location: [facility];  Service: Endoscopy;  Laterality: N/A;   SCAR REVISION N/A 09/01/2014   Procedure: EXCISION OF CICATRIX;  Surgeon: Norleen Edsel GAILS, MD;  Location: [facility];  Service: Gynecology;  Laterality: N/A;   SHOULDER SURGERY Right    rotator cuff   SPINE SURGERY      Family History  Problem Relation Age of Onset   Fibroids Mother    Hypertension Mother    Depression Mother    Irritable bowel syndrome Mother    Arthritis Mother    Heart disease Mother    Mental illness Mother    Stroke Mother    Vision loss Mother    Lupus Mother     Colon polyps Mother    Hypertension Father    Mental illness Father    Arthritis Father    Heart disease Father    Vision loss Father    Colon polyps Father    Hypertension Brother    Early death Brother    Learning disabilities Brother    Mental illness Brother    Heart disease Maternal Grandmother    Cancer Maternal Grandmother        breast   Depression Maternal Grandmother    Hyperlipidemia Maternal Grandmother    Hypertension Maternal Grandmother    Heart disease Maternal Grandfather    Alzheimer's disease Maternal Grandfather    Hyperlipidemia Maternal Grandfather    Hypertension Maternal Grandfather    Dementia Paternal Grandmother    Hyperlipidemia Paternal Grandmother    Hypertension Paternal Grandmother    Cancer Paternal Grandfather        prostate   Heart disease Paternal Grandfather  Hyperlipidemia Paternal Grandfather    Hypertension Paternal Grandfather    Bipolar disorder Daughter    Schizophrenia Daughter    Lupus Daughter    Anxiety disorder Daughter    ADD / ADHD Son    Colon cancer Neg Hx     Social History[2]  Review of Systems  Constitutional:  Negative for fever.  HENT:  Negative for congestion.   Eyes:  Negative for blurred vision.  Respiratory:  Negative for shortness of breath.   Cardiovascular:  Negative for chest pain.  Gastrointestinal:  Negative for abdominal pain.  Genitourinary:  Negative for dysuria.  Musculoskeletal:  Positive for myalgias.  Neurological:  Negative for dizziness and tingling.  Endo/Heme/Allergies:  Negative for environmental allergies and polydipsia. Does not bruise/bleed easily.  Psychiatric/Behavioral:  Negative for suicidal ideas.        [1]  Allergies Allergen Reactions   Flecainide  Swelling   Morphine Shortness Of Breath    Reports it caused her to pass out and have difficulty breathing   Piroxicam Anaphylaxis   Abilify [Aripiprazole]    Citalopram Hives   Codeine Nausea And Vomiting    Depakote [Divalproex Sodium] Swelling   Gabapentin     Hives    Haldol [Haloperidol Lactate] Other (See Comments)    Slurs speech and stumble   Haloperidol    Indomethacin Swelling    Dizzy, tigling of face   Invega [Paliperidone] Other (See Comments)    Slurred speech, drooling   Latuda  [Lurasidone  Hcl] Itching   Lithium Hives   Lurasidone     Promethazine  Swelling    Arms and hands   Trazodone And Nefazodone Other (See Comments)    Dry mouth, congestion, severe dryness.   Lexapro [Escitalopram Oxalate] Rash  [2]  Social History Tobacco Use   Smoking status: Every Day    Current packs/day: 0.50    Average packs/day: 0.5 packs/day for 0.6 years (0.3 ttl pk-yrs)    Types: E-cigarettes, Cigarettes    Start date: 06/2024   Smokeless tobacco: Never   Tobacco comments:    1 PPD  Vaping Use   Vaping status: Some Days  Substance Use Topics   Alcohol use: Not Currently   Drug use: No    Types: Crack cocaine    Comment: last cocaine use July 2024   "

## 2024-12-25 NOTE — Telephone Encounter (Signed)
 noted

## 2024-12-26 ENCOUNTER — Encounter (HOSPITAL_COMMUNITY): Payer: Self-pay

## 2024-12-26 ENCOUNTER — Encounter (HOSPITAL_COMMUNITY)
Admission: RE | Admit: 2024-12-26 | Discharge: 2024-12-26 | Disposition: A | Source: Ambulatory Visit | Attending: Orthopedic Surgery

## 2024-12-26 DIAGNOSIS — X58XXXA Exposure to other specified factors, initial encounter: Secondary | ICD-10-CM | POA: Insufficient documentation

## 2024-12-26 DIAGNOSIS — S46011A Strain of muscle(s) and tendon(s) of the rotator cuff of right shoulder, initial encounter: Secondary | ICD-10-CM | POA: Insufficient documentation

## 2024-12-26 DIAGNOSIS — S46011D Strain of muscle(s) and tendon(s) of the rotator cuff of right shoulder, subsequent encounter: Secondary | ICD-10-CM

## 2024-12-26 DIAGNOSIS — Z01818 Encounter for other preprocedural examination: Secondary | ICD-10-CM | POA: Diagnosis present

## 2024-12-26 DIAGNOSIS — Z01812 Encounter for preprocedural laboratory examination: Secondary | ICD-10-CM | POA: Insufficient documentation

## 2024-12-26 HISTORY — DX: Palpitations: R00.2

## 2024-12-26 LAB — CBC WITH DIFFERENTIAL/PLATELET
Abs Immature Granulocytes: 0.02 K/uL (ref 0.00–0.07)
Basophils Absolute: 0 K/uL (ref 0.0–0.1)
Basophils Relative: 1 %
Eosinophils Absolute: 0.1 K/uL (ref 0.0–0.5)
Eosinophils Relative: 1 %
HCT: 37 % (ref 36.0–46.0)
Hemoglobin: 12.5 g/dL (ref 12.0–15.0)
Immature Granulocytes: 0 %
Lymphocytes Relative: 36 %
Lymphs Abs: 2.7 K/uL (ref 0.7–4.0)
MCH: 32.2 pg (ref 26.0–34.0)
MCHC: 33.8 g/dL (ref 30.0–36.0)
MCV: 95.4 fL (ref 80.0–100.0)
Monocytes Absolute: 0.5 K/uL (ref 0.1–1.0)
Monocytes Relative: 7 %
Neutro Abs: 4.2 K/uL (ref 1.7–7.7)
Neutrophils Relative %: 55 %
Platelets: 194 K/uL (ref 150–400)
RBC: 3.88 MIL/uL (ref 3.87–5.11)
RDW: 12.3 % (ref 11.5–15.5)
WBC: 7.6 K/uL (ref 4.0–10.5)
nRBC: 0 % (ref 0.0–0.2)

## 2024-12-26 LAB — BASIC METABOLIC PANEL WITH GFR
Anion gap: 9 (ref 5–15)
BUN: 14 mg/dL (ref 6–20)
CO2: 25 mmol/L (ref 22–32)
Calcium: 9.3 mg/dL (ref 8.9–10.3)
Chloride: 104 mmol/L (ref 98–111)
Creatinine, Ser: 0.79 mg/dL (ref 0.44–1.00)
GFR, Estimated: >60 mL/min
Glucose, Bld: 101 mg/dL — ABNORMAL HIGH (ref 70–99)
Potassium: 4.6 mmol/L (ref 3.5–5.1)
Sodium: 139 mmol/L (ref 135–145)

## 2024-12-26 NOTE — Pre-Procedure Instructions (Signed)
 Dr Herschell reviewed last cardiology note and does not need cardiology clearance for surgery.

## 2024-12-27 ENCOUNTER — Encounter: Payer: Self-pay | Admitting: Cardiology

## 2024-12-28 ENCOUNTER — Other Ambulatory Visit (HOSPITAL_COMMUNITY): Payer: Self-pay

## 2024-12-29 ENCOUNTER — Ambulatory Visit: Admitting: Internal Medicine

## 2024-12-29 ENCOUNTER — Other Ambulatory Visit: Payer: Self-pay

## 2024-12-29 NOTE — Telephone Encounter (Signed)
 Please verify the procedure I just provided her preoperative risk stratification for rotator cuff repair on December 30, 2024.  See last note.  Dr. Philomene Haff

## 2024-12-30 ENCOUNTER — Telehealth: Payer: Self-pay | Admitting: Orthopedic Surgery

## 2024-12-30 ENCOUNTER — Encounter (HOSPITAL_COMMUNITY): Payer: Self-pay | Admitting: Anesthesiology

## 2024-12-30 ENCOUNTER — Encounter (HOSPITAL_COMMUNITY): Admission: RE | Payer: Self-pay | Source: Home / Self Care

## 2024-12-30 ENCOUNTER — Ambulatory Visit (HOSPITAL_COMMUNITY): Admission: RE | Admit: 2024-12-30 | Source: Home / Self Care | Admitting: Orthopedic Surgery

## 2024-12-30 NOTE — Telephone Encounter (Signed)
 noted

## 2024-12-30 NOTE — Progress Notes (Signed)
 Pt called to say that she did not have a ride to get to the hospital and would have to cancel at this time.

## 2024-12-30 NOTE — Telephone Encounter (Signed)
 Dr. Areatha pt - pt lvm today at 7:11am this morning that she was scheduled for surgery this morning, but the roads are icy and she can't get there.  She would like to reschedule.  [phone]

## 2024-12-30 NOTE — Telephone Encounter (Signed)
 Noted. Will call once we get march schedule

## 2024-12-30 NOTE — Telephone Encounter (Signed)
" ° °  Name: Rachel Underwood  DOB: 1970-12-17  MRN: [id number]   Primary Cardiologist: Madonna Large, DO  Pt seen by Dr. Large 12/19/24. Pt was felt to be low risk for CV complications at that visit. Office note will be forwarded to requesting surgeon's office. Please call with questions.  Glendia Ferrier, PA-C 12/30/2024, 11:19 AM "

## 2024-12-31 ENCOUNTER — Ambulatory Visit: Admitting: Internal Medicine

## 2024-12-31 ENCOUNTER — Other Ambulatory Visit: Payer: Self-pay | Admitting: Orthopedic Surgery

## 2024-12-31 ENCOUNTER — Telehealth: Payer: Self-pay | Admitting: Orthopedic Surgery

## 2024-12-31 ENCOUNTER — Other Ambulatory Visit: Payer: Self-pay

## 2024-12-31 DIAGNOSIS — S46011D Strain of muscle(s) and tendon(s) of the rotator cuff of right shoulder, subsequent encounter: Secondary | ICD-10-CM

## 2024-12-31 DIAGNOSIS — Z01818 Encounter for other preprocedural examination: Secondary | ICD-10-CM

## 2024-12-31 NOTE — Telephone Encounter (Signed)
 OK to start 2 weeks post op or a little longer?

## 2024-12-31 NOTE — Telephone Encounter (Signed)
"   I asked Dr Margrette about RS her he states he will discuss with me later.  "

## 2024-12-31 NOTE — Telephone Encounter (Signed)
-----   Message from Taft Minerva, MD sent at 12/30/2024  7:32 AM EST ----- She cancelled   Speak with me before reschedule

## 2024-12-31 NOTE — Telephone Encounter (Signed)
 I have RS her surgery that was cancelled yesterday to Feb 24th  We have had a hard time recently having to wait on OT  Do you want me to place some outpatient OT orders and tell them when to start after surgery, like we do with the total knee replacement patients?  Placing in advance may help with any delay  She is posted for Arthroscopic RCR (possible mini open )

## 2024-12-31 NOTE — Telephone Encounter (Signed)
 He wants to RS her to Feb 24th I called her to see if that's a good day she stated that is fine. Orders put in

## 2025-01-01 ENCOUNTER — Other Ambulatory Visit: Payer: Self-pay

## 2025-01-01 NOTE — Progress Notes (Signed)
 Clinical Intervention Note  Clinical Intervention Notes: Patient started Dexilant  for acid reflux, no DDIs were identified with her Biktarvy .   Clinical Intervention Outcomes: Prevention of an adverse drug event   Silvano LOISE Blair Karel Santa

## 2025-01-01 NOTE — Progress Notes (Signed)
 Specialty Pharmacy Refill Coordination Note  Rachel Underwood is a 54 y.o. female contacted today regarding refills of specialty medication(s) Bictegravir-Emtricitab-Tenofov (Biktarvy )   Patient requested Delivery   Delivery date: 01/08/25   Verified address: [address]  [place], [place] [address]   Medication will be filled on: 01/07/25

## 2025-01-02 ENCOUNTER — Other Ambulatory Visit: Payer: Self-pay

## 2025-01-05 ENCOUNTER — Other Ambulatory Visit: Payer: Self-pay

## 2025-01-05 ENCOUNTER — Telehealth (HOSPITAL_COMMUNITY): Payer: Self-pay | Admitting: Pharmacist

## 2025-01-05 ENCOUNTER — Other Ambulatory Visit (HOSPITAL_COMMUNITY): Payer: Self-pay

## 2025-01-06 ENCOUNTER — Other Ambulatory Visit (HOSPITAL_COMMUNITY): Payer: Self-pay

## 2025-01-06 ENCOUNTER — Telehealth (HOSPITAL_COMMUNITY): Payer: Self-pay

## 2025-01-06 ENCOUNTER — Ambulatory Visit: Admitting: Family Medicine

## 2025-01-06 ENCOUNTER — Telehealth (INDEPENDENT_AMBULATORY_CARE_PROVIDER_SITE_OTHER): Payer: Self-pay | Admitting: *Deleted

## 2025-01-06 NOTE — Telephone Encounter (Signed)
 Last seen Rachel Underwood on 12/24/24. I have not personally seen anything on this patient.

## 2025-01-06 NOTE — Telephone Encounter (Signed)
 Pharmacy Patient Advocate Encounter   Received notification from Pt Calls Messages that prior authorization for Dexlansoprazole  60 mg capsules is required/requested.   Insurance verification completed.   The patient is insured through [facility] MEDICAID.   Per test claim:  Omeprazole, Lansoprazole, Esomeprazole or Pantoprazole  is preferred by the insurance.  If suggested medication is appropriate, Please send in a new RX and discontinue this one. If not, please advise as to why it's not appropriate so that we may request a Prior Authorization. Please note, some preferred medications may still require a PA.  If the suggested medications have not been trialed and there are no contraindications to their use, the PA will not be submitted, as it will not be approved. Archived Key: --  *patient needs to have tried at least 2 of the above drugs

## 2025-01-07 ENCOUNTER — Other Ambulatory Visit: Payer: Self-pay

## 2025-01-07 ENCOUNTER — Ambulatory Visit: Admitting: Internal Medicine

## 2025-01-07 ENCOUNTER — Encounter: Payer: Self-pay | Admitting: Internal Medicine

## 2025-01-07 ENCOUNTER — Telehealth: Payer: Self-pay

## 2025-01-07 ENCOUNTER — Other Ambulatory Visit (HOSPITAL_COMMUNITY): Payer: Self-pay

## 2025-01-07 VITALS — BP 137/87 | HR 67 | Temp 98.0°F | Ht 65.5 in | Wt 280.0 lb

## 2025-01-07 DIAGNOSIS — G894 Chronic pain syndrome: Secondary | ICD-10-CM

## 2025-01-07 DIAGNOSIS — R7689 Other specified abnormal immunological findings in serum: Secondary | ICD-10-CM

## 2025-01-07 DIAGNOSIS — B20 Human immunodeficiency virus [HIV] disease: Secondary | ICD-10-CM

## 2025-01-07 MED ORDER — BIKTARVY 50-200-25 MG PO TABS
1.0000 | ORAL_TABLET | Freq: Every day | ORAL | 5 refills | Status: AC
  Filled 2025-01-07: qty 30, 30d supply, fill #0

## 2025-01-07 NOTE — Progress Notes (Unsigned)
 "     RFV: follow up for HIV disease  Patient ID: Rachel Underwood, female   DOB: [birth year], 54 y.o.   MRN: [id number]  HPI Rachel Underwood is a 54yo F with well controlled HIV disease, CD 4 count of 733/VL<20 (oct 2025) on biktarvy . No missing doses.  Saw dr camellia blush yesterday for hiatal hernia and gastroesophageal reflux disease, to evaluate for gastric bypass surgery. He wanted her to try to do weight loss.  Hx of +ana, suspect lupus, needs referral to rheumatology  Family hx: daughter with lupus.   Outpatient Encounter Medications as of 01/07/2025  Medication Sig   acyclovir  (ZOVIRAX ) 200 MG capsule Take 2 capsules (400 mg total) by mouth 2 (two) times daily.   albuterol  (VENTOLIN  HFA) 108 (90 Base) MCG/ACT inhaler INHALE 2 PUFFS INTO THE LUNGS EVERY 4 HOURS AS NEEDED FOR WHEEZING OR SHORTNESS OF BREATH.   amLODipine  (NORVASC ) 10 MG tablet Take 1 tablet (10 mg total) by mouth daily.   bictegravir-emtricitabine -tenofovir  AF (BIKTARVY ) 50-200-25 MG TABS tablet Take 1 tablet by mouth daily. (Patient taking differently: Take 1 tablet by mouth at bedtime.)   cromolyn  (OPTICROM ) 4 % ophthalmic solution Place 1 drop into both eyes daily.   cycloSPORINE  (RESTASIS ) 0.05 % ophthalmic emulsion Place 1 drop into both eyes 2 (two) times daily.   dexlansoprazole  (DEXILANT ) 60 MG capsule Take 1 capsule (60 mg total) by mouth daily.   diclofenac Sodium (VOLTAREN) 1 % GEL Apply 1 Application topically daily as needed (shoulder pain).   Ensure (ENSURE) Take 237 mLs by mouth 2 (two) times daily between meals.   EPINEPHrine  (EPIPEN  2-PAK) 0.3 mg/0.3 mL IJ SOAJ injection Inject 1 pen (0.3 mg) into the skin as directed as needed for anaphylaxis.   hydrocortisone  (ANUSOL -HC) 2.5 % rectal cream Place 1 Application rectally 2 (two) times daily. (Patient taking differently: Place 1 Application rectally daily as needed for hemorrhoids.)   hydrOXYzine  (ATARAX ) 50 MG tablet Take 1 tablet (50 mg total) by mouth up to 3 (three)  times daily as needed for anxiety. (Patient taking differently: Take 50 mg by mouth daily as needed for anxiety.)   ibuprofen  (ADVIL ) 200 MG tablet Take 800 mg by mouth every 6 (six) hours as needed for mild pain (pain score 1-3) or moderate pain (pain score 4-6).   lamoTRIgine  (LAMICTAL ) 200 MG tablet Take 1 tablet (200 mg total) by mouth daily.   lubiprostone  (AMITIZA ) 8 MCG capsule Take 1 capsule (8 mcg total) by mouth 2 (two) times daily with a meal.   Menthol-Methyl Salicylate (MUSCLE RUB EX) Apply 1 Application topically daily as needed.   mirabegron  ER (MYRBETRIQ ) 25 MG TB24 tablet Take 1 tablet (25 mg total) by mouth daily.   nystatin  (MYCOSTATIN /NYSTOP ) powder Apply 1 Application topically daily as needed.   ondansetron  (ZOFRAN -ODT) 4 MG disintegrating tablet Dissolve 1 tablet (4 mg total) by mouth 3 (three) times daily as needed.   phenylephrine -shark liver oil-mineral oil-petrolatum  (PREPARATION H) 0.25-14-74.9 % rectal ointment Place 1 Application rectally 2 (two) times daily as needed for hemorrhoids.   propranolol  (INDERAL ) 20 MG tablet Take 1 tablet (20 mg total) by mouth 2 (two) times daily.   QUEtiapine  (SEROQUEL ) 100 MG tablet Take 1 tablet (100 mg total) by mouth 3 (three) times daily. (Patient taking differently: Take 200 mg by mouth at bedtime.)   rosuvastatin  (CRESTOR ) 20 MG tablet Take 1 tablet (20 mg total) by mouth daily.   No facility-administered encounter medications on file as of 01/07/2025.  Patient Active Problem List   Diagnosis Date Noted   Acute frontal sinusitis 10/16/2024   Palpitations 10/03/2022   PSVT (paroxysmal supraventricular tachycardia) 05/18/2022   Morbid obesity (HCC) 05/18/2022   Cocaine abuse (HCC) 05/18/2022   Asthma 05/18/2022   Tobacco abuse disorder 02/01/2021   Positive ANA (antinuclear antibody) 01/28/2021   Arthritis 01/28/2021   Fibromyalgia syndrome 01/28/2021   Bipolar disorder in partial remission 11/14/2016   Bilateral carpal  tunnel syndrome 02/17/2016   Allergic rhinitis due to pollen 02/17/2016   Back pain with history of spinal surgery 11/09/2015   Schatzki's ring    Diverticulosis of colon without hemorrhage    Other hemorrhoids    GERD (gastroesophageal reflux disease) 12/11/2014   S/P hysterectomy 09/01/2014   HTN (hypertension) 01/15/2014   Human immunodeficiency virus (HIV) disease (HCC) 07/17/2013     Health Maintenance Due  Topic Date Due   Hepatitis B Vaccines 19-59 Average Risk (2 of 3 - 19+ 3-dose series) 01/01/2007   Mammogram  01/27/2017   Zoster Vaccines- Shingrix (2 of 2) 09/24/2024   Colonoscopy  12/23/2024   Cervical Cancer Screening (Pap smear)  01/10/2025     Review of Systems  Physical Exam   BP 137/87   Pulse 67   Temp 98 F (36.7 C) (Temporal)   Ht 5' 5.5 (1.664 m)   Wt 280 lb (127 kg)   LMP 08/11/2014   SpO2 96%   BMI 45.89 kg/m    Lab Results  Component Value Date   CD4TCELL 34 09/29/2024   Lab Results  Component Value Date   CD4TABS 733 09/29/2024   CD4TABS 947 01/01/2023   CD4TABS 846 08/15/2022   Lab Results  Component Value Date   HIV1RNAQUANT NOT DETECTED 09/29/2024   Lab Results  Component Value Date   HEPBSAB POS (A) 07/15/2013   Lab Results  Component Value Date   LABRPR NON-REACTIVE 09/29/2024    CBC Lab Results  Component Value Date   WBC 7.6 12/26/2024   RBC 3.88 12/26/2024   HGB 12.5 12/26/2024   HCT 37.0 12/26/2024   PLT 194 12/26/2024   MCV 95.4 12/26/2024   MCH 32.2 12/26/2024   MCHC 33.8 12/26/2024   RDW 12.3 12/26/2024   LYMPHSABS 2.7 12/26/2024   MONOABS 0.5 12/26/2024   EOSABS 0.1 12/26/2024    BMET Lab Results  Component Value Date   NA 139 12/26/2024   K 4.6 12/26/2024   CL 104 12/26/2024   CO2 25 12/26/2024   GLUCOSE 101 (H) 12/26/2024   BUN 14 12/26/2024   CREATININE 0.79 12/26/2024   CALCIUM  9.3 12/26/2024   GFRNONAA >60 12/26/2024   GFRAA 88 09/23/2020      Assessment and Plan  HIV Labs  today Lipid profile  Refer to pain management Refer to rheumatology + aNA, work up for lupus Rachel Underwood = needs weight loss to help with symptoms  See what other options for weight loss   "

## 2025-01-07 NOTE — Telephone Encounter (Signed)
 error

## 2025-01-07 NOTE — Telephone Encounter (Signed)
 Pharmacy Patient Advocate Encounter  Received notification from [facility] MEDICAID that Prior Authorization for Dexlansoprazole  60 mg capsules has been APPROVED from 01/07/25 to 01/06/26. Ran test claim, Copay is $4. This test claim was processed through [facility]- copay amounts may vary at other pharmacies due to pharmacy/plan contracts, or as the patient moves through the different stages of their insurance plan.   PA #/Case ID/Reference #: --

## 2025-01-08 LAB — T-HELPER CELL (CD4) - (RCID CLINIC ONLY)
CD4 % Helper T Cell: 35 % (ref 33–65)
CD4 T Cell Abs: 949 /uL (ref 400–1790)

## 2025-01-09 LAB — COMPLETE METABOLIC PANEL WITHOUT GFR
AG Ratio: 1.8 (calc) (ref 1.0–2.5)
ALT: 23 U/L (ref 6–29)
AST: 18 U/L (ref 10–35)
Albumin: 4.2 g/dL (ref 3.6–5.1)
Alkaline phosphatase (APISO): 59 U/L (ref 37–153)
BUN: 14 mg/dL (ref 7–25)
CO2: 27 mmol/L (ref 20–32)
Calcium: 9.5 mg/dL (ref 8.6–10.4)
Chloride: 106 mmol/L (ref 98–110)
Creat: 0.68 mg/dL (ref 0.50–1.03)
Globulin: 2.3 g/dL (ref 1.9–3.7)
Glucose, Bld: 101 mg/dL — ABNORMAL HIGH (ref 65–99)
Potassium: 4.6 mmol/L (ref 3.5–5.3)
Sodium: 139 mmol/L (ref 135–146)
Total Bilirubin: 0.4 mg/dL (ref 0.2–1.2)
Total Protein: 6.5 g/dL (ref 6.1–8.1)

## 2025-01-09 LAB — CBC WITH DIFFERENTIAL/PLATELET
Absolute Lymphocytes: 2677 {cells}/uL (ref 850–3900)
Absolute Monocytes: 607 {cells}/uL (ref 200–950)
Basophils Absolute: 41 {cells}/uL (ref 0–200)
Basophils Relative: 0.6 %
Eosinophils Absolute: 62 {cells}/uL (ref 15–500)
Eosinophils Relative: 0.9 %
HCT: 37.7 % (ref 35.9–46.0)
Hemoglobin: 12.6 g/dL (ref 11.7–15.5)
MCH: 31.5 pg (ref 27.0–33.0)
MCHC: 33.4 g/dL (ref 31.6–35.4)
MCV: 94.3 fL (ref 81.4–101.7)
MPV: 9.4 fL (ref 7.5–12.5)
Monocytes Relative: 8.8 %
Neutro Abs: 3512 {cells}/uL (ref 1500–7800)
Neutrophils Relative %: 50.9 %
Platelets: 246 10*3/uL (ref 140–400)
RBC: 4 Million/uL (ref 3.80–5.10)
RDW: 12.4 % (ref 11.0–15.0)
Total Lymphocyte: 38.8 %
WBC: 6.9 10*3/uL (ref 3.8–10.8)

## 2025-01-09 LAB — HIV-1 RNA QUANT-NO REFLEX-BLD
HIV 1 RNA Quant: NOT DETECTED {copies}/mL
HIV-1 RNA Quant, Log: NOT DETECTED {Log_copies}/mL

## 2025-01-09 LAB — LIPID PANEL
Cholesterol: 133 mg/dL
HDL: 61 mg/dL
LDL Cholesterol (Calc): 57 mg/dL
Non-HDL Cholesterol (Calc): 72 mg/dL
Total CHOL/HDL Ratio: 2.2 (calc)
Triglycerides: 73 mg/dL

## 2025-01-09 LAB — SYPHILIS: RPR W/REFLEX TO RPR TITER AND TREPONEMAL ANTIBODIES, TRADITIONAL SCREENING AND DIAGNOSIS ALGORITHM: RPR Ser Ql: NONREACTIVE

## 2025-01-16 ENCOUNTER — Ambulatory Visit (HOSPITAL_COMMUNITY): Admitting: Occupational Therapy

## 2025-01-21 ENCOUNTER — Ambulatory Visit (HOSPITAL_COMMUNITY): Admitting: Occupational Therapy

## 2025-01-23 ENCOUNTER — Ambulatory Visit (HOSPITAL_COMMUNITY): Admitting: Occupational Therapy

## 2025-01-27 ENCOUNTER — Ambulatory Visit (HOSPITAL_COMMUNITY): Admitting: Occupational Therapy

## 2025-01-27 ENCOUNTER — Ambulatory Visit (HOSPITAL_COMMUNITY): Admit: 2025-01-27 | Admitting: Orthopedic Surgery

## 2025-01-30 ENCOUNTER — Ambulatory Visit (HOSPITAL_COMMUNITY): Admitting: Occupational Therapy

## 2025-02-02 ENCOUNTER — Ambulatory Visit (HOSPITAL_COMMUNITY): Admitting: Occupational Therapy

## 2025-02-05 ENCOUNTER — Ambulatory Visit (HOSPITAL_COMMUNITY): Admitting: Occupational Therapy

## 2025-02-09 ENCOUNTER — Ambulatory Visit (HOSPITAL_COMMUNITY): Admitting: Occupational Therapy

## 2025-02-10 ENCOUNTER — Ambulatory Visit (HOSPITAL_COMMUNITY): Admitting: Occupational Therapy

## 2025-02-12 ENCOUNTER — Ambulatory Visit (HOSPITAL_COMMUNITY): Admitting: Occupational Therapy

## 2025-02-16 ENCOUNTER — Ambulatory Visit (HOSPITAL_COMMUNITY): Admitting: Occupational Therapy

## 2025-02-19 ENCOUNTER — Ambulatory Visit (HOSPITAL_COMMUNITY)

## 2025-02-19 ENCOUNTER — Encounter

## 2025-02-20 ENCOUNTER — Ambulatory Visit (HOSPITAL_COMMUNITY): Admitting: Occupational Therapy

## 2025-02-23 ENCOUNTER — Ambulatory Visit (HOSPITAL_COMMUNITY): Admitting: Occupational Therapy

## 2025-02-26 ENCOUNTER — Ambulatory Visit (HOSPITAL_COMMUNITY): Admitting: Occupational Therapy

## 2025-03-02 ENCOUNTER — Ambulatory Visit (HOSPITAL_COMMUNITY): Admitting: Occupational Therapy

## 2025-03-05 ENCOUNTER — Ambulatory Visit (HOSPITAL_COMMUNITY): Admitting: Occupational Therapy

## 2025-03-09 ENCOUNTER — Ambulatory Visit (HOSPITAL_COMMUNITY): Admitting: Occupational Therapy

## 2025-03-10 ENCOUNTER — Ambulatory Visit: Admitting: Physician Assistant

## 2025-03-12 ENCOUNTER — Ambulatory Visit (HOSPITAL_COMMUNITY): Admitting: Occupational Therapy

## 2025-03-16 ENCOUNTER — Ambulatory Visit (HOSPITAL_COMMUNITY): Admitting: Occupational Therapy

## 2025-03-19 ENCOUNTER — Ambulatory Visit (HOSPITAL_COMMUNITY): Admitting: Occupational Therapy

## 2025-03-23 ENCOUNTER — Ambulatory Visit (HOSPITAL_COMMUNITY): Admitting: Occupational Therapy

## 2025-03-26 ENCOUNTER — Ambulatory Visit (HOSPITAL_COMMUNITY): Admitting: Occupational Therapy

## 2025-04-06 ENCOUNTER — Ambulatory Visit: Payer: Self-pay | Admitting: Internal Medicine
